# Patient Record
Sex: Female | Born: 1940 | Race: Black or African American | Hispanic: No | Marital: Married | Smoking: Former smoker
Health system: Southern US, Community
[De-identification: ages and names within clinical notes are randomized; demographics above are authoritative.]

## PROBLEM LIST (undated history)

## (undated) DIAGNOSIS — I951 Orthostatic hypotension: Secondary | ICD-10-CM

## (undated) DIAGNOSIS — R51 Headache: Secondary | ICD-10-CM

## (undated) DIAGNOSIS — I2541 Coronary artery aneurysm: Secondary | ICD-10-CM

## (undated) DIAGNOSIS — J449 Chronic obstructive pulmonary disease, unspecified: Secondary | ICD-10-CM

## (undated) DIAGNOSIS — K219 Gastro-esophageal reflux disease without esophagitis: Secondary | ICD-10-CM

## (undated) DIAGNOSIS — G4700 Insomnia, unspecified: Secondary | ICD-10-CM

## (undated) DIAGNOSIS — R519 Headache, unspecified: Secondary | ICD-10-CM

## (undated) DIAGNOSIS — I509 Heart failure, unspecified: Secondary | ICD-10-CM

## (undated) DIAGNOSIS — D649 Anemia, unspecified: Secondary | ICD-10-CM

## (undated) DIAGNOSIS — E1140 Type 2 diabetes mellitus with diabetic neuropathy, unspecified: Secondary | ICD-10-CM

## (undated) DIAGNOSIS — I10 Essential (primary) hypertension: Secondary | ICD-10-CM

## (undated) DIAGNOSIS — N189 Chronic kidney disease, unspecified: Secondary | ICD-10-CM

## (undated) DIAGNOSIS — Q046 Congenital cerebral cysts: Secondary | ICD-10-CM

## (undated) DIAGNOSIS — I739 Peripheral vascular disease, unspecified: Secondary | ICD-10-CM

## (undated) DIAGNOSIS — I219 Acute myocardial infarction, unspecified: Secondary | ICD-10-CM

## (undated) DIAGNOSIS — Z8669 Personal history of other diseases of the nervous system and sense organs: Secondary | ICD-10-CM

## (undated) DIAGNOSIS — I2510 Atherosclerotic heart disease of native coronary artery without angina pectoris: Secondary | ICD-10-CM

## (undated) DIAGNOSIS — M1990 Unspecified osteoarthritis, unspecified site: Secondary | ICD-10-CM

## (undated) HISTORY — PX: OTHER SURGICAL HISTORY: SHX169

## (undated) HISTORY — DX: Heart failure, unspecified: I50.9

## (undated) HISTORY — DX: Coronary artery aneurysm: I25.41

## (undated) HISTORY — DX: Gastro-esophageal reflux disease without esophagitis: K21.9

## (undated) HISTORY — PX: ABDOMINAL HYSTERECTOMY: SHX81

## (undated) HISTORY — PX: CARPAL TUNNEL RELEASE: SHX101

## (undated) HISTORY — DX: Atherosclerotic heart disease of native coronary artery without angina pectoris: I25.10

## (undated) HISTORY — DX: Chronic kidney disease, unspecified: N18.9

## (undated) HISTORY — DX: Chronic obstructive pulmonary disease, unspecified: J44.9

## (undated) HISTORY — DX: Anemia, unspecified: D64.9

## (undated) HISTORY — DX: Essential (primary) hypertension: I10

## (undated) HISTORY — DX: Acute myocardial infarction, unspecified: I21.9

## (undated) HISTORY — DX: Unspecified osteoarthritis, unspecified site: M19.90

## (undated) HISTORY — PX: AV FISTULA PLACEMENT: SHX1204

## (undated) HISTORY — DX: Insomnia, unspecified: G47.00

## (undated) HISTORY — DX: Peripheral vascular disease, unspecified: I73.9

## (undated) HISTORY — DX: Type 2 diabetes mellitus with diabetic neuropathy, unspecified: E11.40

---

## 2010-02-07 DIAGNOSIS — I2510 Atherosclerotic heart disease of native coronary artery without angina pectoris: Secondary | ICD-10-CM

## 2010-02-07 HISTORY — DX: Atherosclerotic heart disease of native coronary artery without angina pectoris: I25.10

## 2010-02-07 HISTORY — PX: CORONARY ARTERY BYPASS GRAFT: SHX141

## 2011-02-08 DIAGNOSIS — I219 Acute myocardial infarction, unspecified: Secondary | ICD-10-CM

## 2011-02-08 HISTORY — DX: Acute myocardial infarction, unspecified: I21.9

## 2011-05-16 ENCOUNTER — Encounter: Payer: Self-pay | Admitting: Vascular Surgery

## 2011-05-17 ENCOUNTER — Encounter: Payer: Self-pay | Admitting: Vascular Surgery

## 2011-05-24 ENCOUNTER — Encounter: Payer: Self-pay | Admitting: Vascular Surgery

## 2011-05-25 ENCOUNTER — Encounter: Payer: Self-pay | Admitting: Vascular Surgery

## 2011-05-25 ENCOUNTER — Ambulatory Visit (INDEPENDENT_AMBULATORY_CARE_PROVIDER_SITE_OTHER): Payer: Medicare Other | Admitting: Vascular Surgery

## 2011-05-25 ENCOUNTER — Ambulatory Visit (INDEPENDENT_AMBULATORY_CARE_PROVIDER_SITE_OTHER): Payer: Medicare Other | Admitting: *Deleted

## 2011-05-25 VITALS — BP 162/69 | HR 72 | Temp 98.1°F | Ht 66.0 in | Wt 176.0 lb

## 2011-05-25 DIAGNOSIS — L97509 Non-pressure chronic ulcer of other part of unspecified foot with unspecified severity: Secondary | ICD-10-CM

## 2011-05-25 DIAGNOSIS — I7025 Atherosclerosis of native arteries of other extremities with ulceration: Secondary | ICD-10-CM | POA: Insufficient documentation

## 2011-05-25 DIAGNOSIS — I739 Peripheral vascular disease, unspecified: Secondary | ICD-10-CM

## 2011-05-25 DIAGNOSIS — L98499 Non-pressure chronic ulcer of skin of other sites with unspecified severity: Secondary | ICD-10-CM

## 2011-05-25 DIAGNOSIS — I779 Disorder of arteries and arterioles, unspecified: Secondary | ICD-10-CM

## 2011-05-25 NOTE — Progress Notes (Signed)
[facility] and [facility]in Specialist of [facility]  Patient name: Holly Ellison MRN: [id number] DOB: 12-12-1940 Sex: female  REASON FOR CONSULT: Peripheral vascular disease  HPI: Holly Ellison is a 71 y.o. female who recently moved from [place] approximately 2 months ago. She has had a previous left carotid endarterectomy in his also had previous coronary revascularization done in [place]. She was noted to have a trophic ulcer on her left foot with poor circulation and was referred by Dr. Lucianne Lei for vascular consultation. She describes pain in both lower extremities but more significantly on the left side. She experiences pain in her thigh and calf which is associated with ambulation and is not really relieved with rest. Her symptoms occur with standing sitting and also lying flat. She does have significant neuropathy in both feet. I do not get any clear-cut history of rest pain. She states that she's had Allis on her left foot for some time but does not know exactly how long. She denies any significant drainage from the talus on her left foot or erythema. She's had no fever or chills.  Past Medical History  Diagnosis Date  . Chronic kidney disease   . Arthritis     Osteoarthritis  . Neuropathy in diabetes   . Insomnia   . Asthma   . CAD (coronary artery disease) 2012    3 stents  . GERD (gastroesophageal reflux disease)   . Diabetes mellitus     Type II  . COPD (chronic obstructive pulmonary disease)   . Hypertension   . Peripheral vascular disease   . Anemia   . CHF (congestive heart failure)   . Coronary artery dilation   . Myocardial infarction 2013    x 3    History reviewed. No pertinent family history.  SOCIAL HISTORY: History  Substance Use Topics  . Smoking status: Former Smoker    Types: Cigarettes    Quit date: 05/24/1996  . Smokeless tobacco: Never Used  . Alcohol Use: No    Allergies  Allergen Reactions  . Ciprocin-Fluocin-Procin (Fluocinolone Acetonide)  Itching  . Dilaudid (Hydromorphone Hcl) Itching  . Morphine Sulfate Itching  . Opium Itching    Current Outpatient Prescriptions  Medication Sig Dispense Refill  . acetaminophen (TYLENOL) 325 MG tablet Take 650 mg by mouth every 6 (six) hours as needed.      Marland Kitchen AMLODIPINE BESYLATE PO Take 10 mg by mouth daily.      Marland Kitchen aspirin EC 81 MG tablet Take 81 mg by mouth daily.      . ATORVASTATIN CALCIUM PO Take 40 mg by mouth at bedtime.      . bisacodyl (DULCOLAX) 5 MG EC tablet Take 5 mg by mouth daily as needed.      . bumetanide (BUMEX) 1 MG tablet Take 1 mg by mouth daily.      Marland Kitchen CLOPIDOGREL BISULFATE PO Take 75 mg by mouth daily.      . darbepoetin (ARANESP, ALBUMIN FREE,) 40 MCG/0.4ML SOLN Inject 40 mcg into the skin once a week. Every week on Wednesday      . fluticasone (FLOVENT HFA) 110 MCG/ACT inhaler Inhale 1 puff into the lungs 2 (two) times daily.      Marland Kitchen gabapentin (NEURONTIN) 100 MG capsule Take 100 mg by mouth 3 (three) times daily.      Marland Kitchen HYDRALAZINE HCL PO Take 100 mg by mouth 3 (three) times daily.      . insulin glargine (LANTUS) 100 UNIT/ML injection Inject into  the skin at bedtime.      . insulin lispro (HUMALOG) 100 UNIT/ML injection Inject 100 Units into the skin 3 (three) times daily before meals.      Marland Kitchen ipratropium-albuterol (DUONEB) 0.5-2.5 (3) MG/3ML SOLN Take 0.5-2.5 mLs by nebulization.      . isosorbide mononitrate (IMDUR) 60 MG 24 hr tablet Take 60 mg by mouth daily.      Marland Kitchen lactulose (CHRONULAC) 10 GM/15ML solution Take 20 g by mouth 3 (three) times daily.      . methimazole (TAPAZOLE) 10 MG tablet Take 10 mg by mouth 3 (three) times daily.      . metolazone (ZAROXOLYN) 2.5 MG tablet Take 2.5 mg by mouth. Every  2 days      . metoprolol succinate (TOPROL-XL) 100 MG 24 hr tablet Take 100 mg by mouth 2 (two) times daily. Take with or immediately following a meal.      . oxychlorosene (CLORPACTIN WCS 90) powder Apply topically once.      . OXYCODONE HCL PO Take 10 mg by  mouth every 8 (eight) hours as needed.      . pantoprazole (PROTONIX) 20 MG tablet Take 20 mg by mouth daily.      Marland Kitchen PANTOPRAZOLE SODIUM PO Take 40 mg by mouth daily.      . Potassium Chloride Crys CR (KLOR-CON M20 PO) Take 20 mEq by mouth daily.      Bernadette Hoit Sodium (SENNA PLUS PO) Take by mouth.      . Vitamin D, Ergocalciferol, (DRISDOL) 50000 UNITS CAPS Take 50,000 Units by mouth.        REVIEW OF SYSTEMS: Arly.Keller ] denotes positive finding; [  ] denotes negative finding  CARDIOVASCULAR:  Arly.Keller ] chest pain- stable   [ ]  chest pressure   [ ]  palpitations   [ ]  orthopnea   Arly.Keller ] dyspnea on exertion   Arly.Keller ] claudication   [ ]  rest pain   [ ]  DVT   [ ]  phlebitis PULMONARY:   [ ]  productive cough   [ ]  asthma   Arly.Keller ] wheezing NEUROLOGIC:   Arly.Keller ] weakness  Arly.Keller ] paresthesias  [ ]  aphasia  [ ]  amaurosis  [ ]  dizziness HEMATOLOGIC:   [ ]  bleeding problems   [ ]  clotting disorders MUSCULOSKELETAL:  [ ]  joint pain   [ ]  joint swelling [ ]  leg swelling GASTROINTESTINAL: [ ]   blood in stool  [ ]   hematemesis GENITOURINARY:  [ ]   dysuria  [ ]   hematuria PSYCHIATRIC:  [ ]  history of major depression INTEGUMENTARY:  [ ]  rashes  [ ]  ulcers CONSTITUTIONAL:  [ ]  fever   [ ]  chills  PHYSICAL EXAM: Filed Vitals:   05/25/11 1029  BP: 162/69  Pulse: 72  Temp: 98.1 F (36.7 C)  TempSrc: Oral  Height: 5\' 6"  (1.676 m)  Weight: 176 lb (79.833 kg)   Body mass index is 28.41 kg/(m^2). GENERAL: The patient is a well-nourished female, in no acute distress. The vital signs are documented above. CARDIOVASCULAR: There is a regular rate and rhythm without significant murmur appreciated. I do not detect carotid bruits. She does have a left neck incision where she had previous left carotid endarterectomy she has a diminished but palpable left radial pulse with a functioning left upper arm AV fistula. The fistula has an excellent thrill. She has palpable femoral pulses. I cannot palpate a right popliteal pulse.  She has a diminished left popliteal pulse.  I cannot palpate pedal pulses. Both feet appear adequately perfused. PULMONARY: There is good air exchange bilaterally without wheezing or rales. ABDOMEN: Soft and non-tender with normal pitched bowel sounds.  MUSCULOSKELETAL: There are no major deformities or cyanosis. NEUROLOGIC: No focal weakness or paresthesias are detected. SKIN: There are no ulcers or rashes noted. She does have a callous on the plantar aspect of her left foot beneath the first metatarsal head. PSYCHIATRIC: The patient has a normal affect.  I have independently interpreted her arterial Doppler study which was done in our office today. She has monophasic Doppler signals in the right foot with an ABI of 60%. She has triphasic Doppler signals in the left posterior tibial and dorsalis pedis positions with an ABI of 94%.  I have also reviewed the records sent with her from [facility]. She is being evaluated with a trophic ulcer is being referred to a podiatrist. Also looked her records from [place]. She has a functioning left upper arm AV graft although she is currently not on dialysis.  MEDICAL ISSUES: With respect to her left leg symptoms, I cannot attribute this to peripheral vascular disease. Although I cannot palpate pedal pulses on the left, she has triphasic Doppler signals in the dorsalis pedis and posterior tibial positions with an ABI of 94%. In addition, she experiences pain in the left leg with standing and sitting in lying flat which are not consistent with the symptoms she would experience with peripheral vascular disease. She does have evidence of infrainguinal arterial occlusive disease on the right but has no nonhealing ulcers in no rest pain or significant claudication.   At this point I do not think she needs any further workup from a vascular standpoint unless she developed significant symptoms on the right lower extremity. I have ordered follow up ABIs  for 6 months and I'll see her back at that time. In addition given that she's had previous left carotid endarterectomy will obtain a carotid duplex scan to have as a baseline at that time. She knows to call sooner she has problems. Fortunately she is not a smoker.  Johari Bennetts S [facility] [place] [facility]: [phone]

## 2011-06-05 ENCOUNTER — Observation Stay (HOSPITAL_COMMUNITY)
Admission: AD | Admit: 2011-06-05 | Discharge: 2011-06-11 | Disposition: A | Discharge status: Home / Self Care | Payer: Medicare Other | Source: Other Acute Inpatient Hospital | Attending: Family Medicine | Admitting: Family Medicine

## 2011-06-05 ENCOUNTER — Encounter (HOSPITAL_COMMUNITY): Payer: Self-pay | Admitting: *Deleted

## 2011-06-05 ENCOUNTER — Inpatient Hospital Stay (HOSPITAL_COMMUNITY): Payer: Medicare Other

## 2011-06-05 DIAGNOSIS — I5030 Unspecified diastolic (congestive) heart failure: Principal | ICD-10-CM | POA: Insufficient documentation

## 2011-06-05 DIAGNOSIS — D638 Anemia in other chronic diseases classified elsewhere: Secondary | ICD-10-CM | POA: Insufficient documentation

## 2011-06-05 DIAGNOSIS — G629 Polyneuropathy, unspecified: Secondary | ICD-10-CM

## 2011-06-05 DIAGNOSIS — E1142 Type 2 diabetes mellitus with diabetic polyneuropathy: Secondary | ICD-10-CM | POA: Insufficient documentation

## 2011-06-05 DIAGNOSIS — I2510 Atherosclerotic heart disease of native coronary artery without angina pectoris: Secondary | ICD-10-CM | POA: Insufficient documentation

## 2011-06-05 DIAGNOSIS — E871 Hypo-osmolality and hyponatremia: Secondary | ICD-10-CM | POA: Insufficient documentation

## 2011-06-05 DIAGNOSIS — I509 Heart failure, unspecified: Secondary | ICD-10-CM | POA: Insufficient documentation

## 2011-06-05 DIAGNOSIS — E1149 Type 2 diabetes mellitus with other diabetic neurological complication: Secondary | ICD-10-CM | POA: Insufficient documentation

## 2011-06-05 DIAGNOSIS — R079 Chest pain, unspecified: Secondary | ICD-10-CM | POA: Insufficient documentation

## 2011-06-05 DIAGNOSIS — J4489 Other specified chronic obstructive pulmonary disease: Secondary | ICD-10-CM | POA: Insufficient documentation

## 2011-06-05 DIAGNOSIS — E119 Type 2 diabetes mellitus without complications: Secondary | ICD-10-CM | POA: Diagnosis present

## 2011-06-05 DIAGNOSIS — I120 Hypertensive chronic kidney disease with stage 5 chronic kidney disease or end stage renal disease: Secondary | ICD-10-CM | POA: Insufficient documentation

## 2011-06-05 DIAGNOSIS — I739 Peripheral vascular disease, unspecified: Secondary | ICD-10-CM

## 2011-06-05 DIAGNOSIS — J811 Chronic pulmonary edema: Secondary | ICD-10-CM

## 2011-06-05 DIAGNOSIS — M1990 Unspecified osteoarthritis, unspecified site: Secondary | ICD-10-CM | POA: Insufficient documentation

## 2011-06-05 DIAGNOSIS — N185 Chronic kidney disease, stage 5: Secondary | ICD-10-CM

## 2011-06-05 DIAGNOSIS — I10 Essential (primary) hypertension: Secondary | ICD-10-CM

## 2011-06-05 DIAGNOSIS — I25810 Atherosclerosis of coronary artery bypass graft(s) without angina pectoris: Secondary | ICD-10-CM

## 2011-06-05 DIAGNOSIS — J449 Chronic obstructive pulmonary disease, unspecified: Secondary | ICD-10-CM | POA: Diagnosis present

## 2011-06-05 DIAGNOSIS — Z951 Presence of aortocoronary bypass graft: Secondary | ICD-10-CM | POA: Insufficient documentation

## 2011-06-05 DIAGNOSIS — R0602 Shortness of breath: Secondary | ICD-10-CM | POA: Insufficient documentation

## 2011-06-05 HISTORY — DX: Congenital cerebral cysts: Q04.6

## 2011-06-05 LAB — GLUCOSE, CAPILLARY
Glucose-Capillary: 163 mg/dL — ABNORMAL HIGH (ref 70–99)
Glucose-Capillary: 122 mg/dL — ABNORMAL HIGH (ref 70–99)

## 2011-06-05 LAB — HEPATITIS B SURFACE ANTIBODY,QUALITATIVE: Hep B S Ab: POSITIVE — AB

## 2011-06-05 LAB — COMPREHENSIVE METABOLIC PANEL
Albumin: 3 g/dL — ABNORMAL LOW (ref 3.5–5.2)
BUN: 77 mg/dL — ABNORMAL HIGH (ref 6–23)
Calcium: 8.4 mg/dL (ref 8.4–10.5)
Creatinine, Ser: 6.29 mg/dL — ABNORMAL HIGH (ref 0.50–1.10)
GFR calc Af Amer: 7 mL/min — ABNORMAL LOW (ref >90)
Glucose, Bld: 104 mg/dL — ABNORMAL HIGH (ref 70–99)
Potassium: 4.1 mEq/L (ref 3.5–5.1)
Total Protein: 6.8 g/dL (ref 6.0–8.3)

## 2011-06-05 LAB — TROPONIN I
Troponin I: <0.3 ng/mL (ref <0.30)
Troponin I: <0.3 ng/mL (ref <0.30)
Troponin I: <0.3 ng/mL (ref <0.30)

## 2011-06-05 LAB — CBC
HCT: 22.1 % — ABNORMAL LOW (ref 36.0–46.0)
HCT: 22.8 % — ABNORMAL LOW (ref 36.0–46.0)
Hemoglobin: 7.4 g/dL — ABNORMAL LOW (ref 12.0–15.0)
MCH: 27.1 pg (ref 26.0–34.0)
MCHC: 32.6 g/dL (ref 30.0–36.0)
MCHC: 32.5 g/dL (ref 30.0–36.0)
MCV: 83.1 fL (ref 78.0–100.0)
MCV: 83.8 fL (ref 78.0–100.0)
RDW: 18.2 % — ABNORMAL HIGH (ref 11.5–15.5)
RDW: 18.3 % — ABNORMAL HIGH (ref 11.5–15.5)

## 2011-06-05 LAB — IRON AND TIBC
Iron: 13 ug/dL — ABNORMAL LOW (ref 42–135)
Iron: <10 ug/dL — ABNORMAL LOW (ref 42–135)
Saturation Ratios: 5 % — ABNORMAL LOW (ref 20–55)
TIBC: 259 ug/dL (ref 250–470)
UIBC: 246 ug/dL (ref 125–400)
UIBC: 244 ug/dL (ref 125–400)

## 2011-06-05 LAB — BASIC METABOLIC PANEL
BUN: 77 mg/dL — ABNORMAL HIGH (ref 6–23)
CO2: 21 mEq/L (ref 19–32)
Chloride: 93 mEq/L — ABNORMAL LOW (ref 96–112)
Creatinine, Ser: 6.32 mg/dL — ABNORMAL HIGH (ref 0.50–1.10)
GFR calc Af Amer: 7 mL/min — ABNORMAL LOW (ref >90)
Potassium: 3.6 mEq/L (ref 3.5–5.1)

## 2011-06-05 LAB — LIPID PANEL
Cholesterol: 110 mg/dL (ref 0–200)
Triglycerides: 98 mg/dL (ref <150)

## 2011-06-05 LAB — PREPARE RBC (CROSSMATCH)

## 2011-06-05 LAB — RETICULOCYTES: Retic Ct Pct: 7 % — ABNORMAL HIGH (ref 0.4–3.1)

## 2011-06-05 LAB — FERRITIN: Ferritin: 86 ng/mL (ref 10–291)

## 2011-06-05 MED ORDER — CLOPIDOGREL BISULFATE 75 MG PO TABS
75.0000 mg | ORAL_TABLET | Freq: Every day | ORAL | Status: DC
  Administered 2011-06-05 – 2011-06-11 (×7): 75 mg via ORAL
  Filled 2011-06-05 (×9): qty 1

## 2011-06-05 MED ORDER — HEPARIN SODIUM (PORCINE) 1000 UNIT/ML DIALYSIS
1000.0000 [IU] | INTRAMUSCULAR | Status: DC | PRN
  Filled 2011-06-05: qty 1

## 2011-06-05 MED ORDER — LACTULOSE 10 GM/15ML PO SOLN
20.0000 g | Freq: Three times a day (TID) | ORAL | Status: DC
  Administered 2011-06-05 – 2011-06-07 (×6): 20 g via ORAL
  Filled 2011-06-05 (×12): qty 30

## 2011-06-05 MED ORDER — OXYCODONE HCL 10 MG PO TB12
10.0000 mg | ORAL_TABLET | Freq: Two times a day (BID) | ORAL | Status: DC
  Administered 2011-06-05 – 2011-06-07 (×4): 10 mg via ORAL
  Filled 2011-06-05 (×5): qty 1

## 2011-06-05 MED ORDER — METOPROLOL SUCCINATE ER 100 MG PO TB24
100.0000 mg | ORAL_TABLET | Freq: Every day | ORAL | Status: DC
  Administered 2011-06-05 – 2011-06-11 (×7): 100 mg via ORAL
  Filled 2011-06-05 (×7): qty 1

## 2011-06-05 MED ORDER — PENTAFLUOROPROP-TETRAFLUOROETH EX AERO: 1.0000 "application " | INHALATION_SPRAY | CUTANEOUS | Status: DC | PRN

## 2011-06-05 MED ORDER — HYDRALAZINE HCL 50 MG PO TABS
100.0000 mg | ORAL_TABLET | Freq: Three times a day (TID) | ORAL | Status: DC
  Administered 2011-06-05 – 2011-06-11 (×17): 100 mg via ORAL
  Filled 2011-06-05 (×22): qty 2

## 2011-06-05 MED ORDER — ALTEPLASE 2 MG IJ SOLR
2.0000 mg | Freq: Once | INTRAMUSCULAR | Status: AC | PRN
  Filled 2011-06-05: qty 2

## 2011-06-05 MED ORDER — SODIUM CHLORIDE 0.9 % IV SOLN: 100.0000 mL | INTRAVENOUS | Status: DC | PRN

## 2011-06-05 MED ORDER — OXYCODONE HCL 10 MG PO TB12
10.0000 mg | ORAL_TABLET | Freq: Two times a day (BID) | ORAL | Status: DC
  Filled 2011-06-05 (×3): qty 1

## 2011-06-05 MED ORDER — LIDOCAINE-PRILOCAINE 2.5-2.5 % EX CREA
1.0000 "application " | TOPICAL_CREAM | CUTANEOUS | Status: DC | PRN
  Filled 2011-06-05: qty 5

## 2011-06-05 MED ORDER — METHIMAZOLE 10 MG PO TABS
10.0000 mg | ORAL_TABLET | Freq: Three times a day (TID) | ORAL | Status: DC
  Administered 2011-06-05 – 2011-06-11 (×18): 10 mg via ORAL
  Filled 2011-06-05 (×21): qty 1

## 2011-06-05 MED ORDER — ASPIRIN EC 325 MG PO TBEC
325.0000 mg | DELAYED_RELEASE_TABLET | Freq: Every day | ORAL | Status: DC
  Administered 2011-06-05 – 2011-06-11 (×7): 325 mg via ORAL
  Filled 2011-06-05 (×7): qty 1

## 2011-06-05 MED ORDER — HEPARIN SODIUM (PORCINE) 5000 UNIT/ML IJ SOLN
5000.0000 [IU] | Freq: Three times a day (TID) | INTRAMUSCULAR | Status: DC
  Administered 2011-06-05 – 2011-06-10 (×15): 5000 [IU] via SUBCUTANEOUS
  Filled 2011-06-05 (×19): qty 1

## 2011-06-05 MED ORDER — NITROGLYCERIN 2 % TD OINT
0.5000 [in_us] | TOPICAL_OINTMENT | Freq: Four times a day (QID) | TRANSDERMAL | Status: DC
  Administered 2011-06-05 (×2): 0.5 [in_us] via TOPICAL
  Filled 2011-06-05: qty 30

## 2011-06-05 MED ORDER — ISOSORBIDE MONONITRATE ER 60 MG PO TB24
60.0000 mg | ORAL_TABLET | Freq: Every day | ORAL | Status: DC
  Administered 2011-06-05 – 2011-06-11 (×7): 60 mg via ORAL
  Filled 2011-06-05 (×7): qty 1

## 2011-06-05 MED ORDER — HYDROMORPHONE HCL PF 1 MG/ML IJ SOLN: 0.5000 mg | INTRAMUSCULAR | Status: DC | PRN

## 2011-06-05 MED ORDER — SODIUM CHLORIDE 0.9 % IJ SOLN: 3.0000 mL | INTRAMUSCULAR | Status: DC | PRN

## 2011-06-05 MED ORDER — LIDOCAINE HCL (PF) 1 % IJ SOLN: 5.0000 mL | INTRAMUSCULAR | Status: DC | PRN

## 2011-06-05 MED ORDER — NITROGLYCERIN IN D5W 200-5 MCG/ML-% IV SOLN
2.0000 ug/min | INTRAVENOUS | Status: DC
  Administered 2011-06-05: 20 ug/min via INTRAVENOUS

## 2011-06-05 MED ORDER — ONDANSETRON HCL 4 MG/2ML IJ SOLN
4.0000 mg | Freq: Four times a day (QID) | INTRAMUSCULAR | Status: DC | PRN
  Administered 2011-06-06 – 2011-06-08 (×3): 4 mg via INTRAVENOUS
  Filled 2011-06-05 (×3): qty 2

## 2011-06-05 MED ORDER — FLUTICASONE PROPIONATE HFA 110 MCG/ACT IN AERO
1.0000 | INHALATION_SPRAY | Freq: Two times a day (BID) | RESPIRATORY_TRACT | Status: DC
  Administered 2011-06-05 – 2011-06-10 (×9): 1 via RESPIRATORY_TRACT
  Filled 2011-06-05: qty 12

## 2011-06-05 MED ORDER — INSULIN ASPART 100 UNIT/ML ~~LOC~~ SOLN
0.0000 [IU] | Freq: Three times a day (TID) | SUBCUTANEOUS | Status: DC
  Administered 2011-06-05: 2 [IU] via SUBCUTANEOUS
  Administered 2011-06-05: 3 [IU] via SUBCUTANEOUS
  Administered 2011-06-06: 2 [IU] via SUBCUTANEOUS
  Administered 2011-06-08: 3 [IU] via SUBCUTANEOUS
  Administered 2011-06-08 – 2011-06-10 (×2): 2 [IU] via SUBCUTANEOUS

## 2011-06-05 MED ORDER — FUROSEMIDE 10 MG/ML IJ SOLN
120.0000 mg | Freq: Two times a day (BID) | INTRAVENOUS | Status: DC
  Administered 2011-06-05 (×2): 120 mg via INTRAVENOUS
  Filled 2011-06-05 (×4): qty 12

## 2011-06-05 MED ORDER — SIMVASTATIN 20 MG PO TABS
20.0000 mg | ORAL_TABLET | Freq: Every day | ORAL | Status: DC
  Administered 2011-06-05 – 2011-06-10 (×6): 20 mg via ORAL
  Filled 2011-06-05 (×7): qty 1

## 2011-06-05 MED ORDER — ONDANSETRON HCL 4 MG PO TABS
4.0000 mg | ORAL_TABLET | Freq: Four times a day (QID) | ORAL | Status: DC | PRN
  Administered 2011-06-06: 4 mg via ORAL
  Filled 2011-06-05 (×2): qty 1

## 2011-06-05 MED ORDER — PANTOPRAZOLE SODIUM 40 MG PO TBEC
40.0000 mg | DELAYED_RELEASE_TABLET | Freq: Every day | ORAL | Status: DC
  Administered 2011-06-05 – 2011-06-11 (×7): 40 mg via ORAL
  Filled 2011-06-05 (×6): qty 1

## 2011-06-05 MED ORDER — ALUM & MAG HYDROXIDE-SIMETH 200-200-20 MG/5ML PO SUSP
30.0000 mL | Freq: Four times a day (QID) | ORAL | Status: DC | PRN
  Filled 2011-06-05: qty 30

## 2011-06-05 MED ORDER — INSULIN ASPART 100 UNIT/ML ~~LOC~~ SOLN: 0.0000 [IU] | Freq: Every day | SUBCUTANEOUS | Status: DC

## 2011-06-05 MED ORDER — BISACODYL 5 MG PO TBEC
5.0000 mg | DELAYED_RELEASE_TABLET | Freq: Every day | ORAL | Status: DC | PRN
  Administered 2011-06-08: 5 mg via ORAL
  Filled 2011-06-05: qty 1

## 2011-06-05 MED ORDER — INSULIN GLARGINE 100 UNIT/ML ~~LOC~~ SOLN
30.0000 [IU] | Freq: Every day | SUBCUTANEOUS | Status: DC
  Administered 2011-06-05 – 2011-06-10 (×5): 30 [IU] via SUBCUTANEOUS

## 2011-06-05 MED ORDER — NEPRO/CARBSTEADY PO LIQD
237.0000 mL | ORAL | Status: DC | PRN
  Filled 2011-06-05: qty 237

## 2011-06-05 MED ORDER — AMLODIPINE BESYLATE 10 MG PO TABS
10.0000 mg | ORAL_TABLET | Freq: Every day | ORAL | Status: DC
  Administered 2011-06-05 – 2011-06-11 (×7): 10 mg via ORAL
  Filled 2011-06-05 (×7): qty 1

## 2011-06-05 MED ORDER — HEPARIN SODIUM (PORCINE) 1000 UNIT/ML DIALYSIS
2000.0000 [IU] | INTRAMUSCULAR | Status: DC | PRN
  Administered 2011-06-09: 2000 [IU] via INTRAVENOUS_CENTRAL
  Filled 2011-06-05: qty 2

## 2011-06-05 MED ORDER — GABAPENTIN 100 MG PO CAPS
100.0000 mg | ORAL_CAPSULE | Freq: Three times a day (TID) | ORAL | Status: DC
  Administered 2011-06-05 – 2011-06-11 (×18): 100 mg via ORAL
  Filled 2011-06-05 (×21): qty 1

## 2011-06-05 MED ORDER — FUROSEMIDE 10 MG/ML IJ SOLN
120.0000 mg | Freq: Three times a day (TID) | INTRAVENOUS | Status: DC
  Administered 2011-06-05 (×2): 120 mg via INTRAVENOUS
  Filled 2011-06-05 (×5): qty 12

## 2011-06-05 NOTE — Consult Note (Signed)
Holly Ellison 06/05/2011 Holly Ellison D Requesting Physician:  Dr. Butler Denmark  Reason for Consult:  CKD pt with SOB , pulm edema, creat over 6 HPI: The patient is a 71 y.o. year-old AAF with hx of DM, HTN, CAD with CABG 2012 and CKD followed by Dr. Caryn Section with [facility]. She had a LUA AVF fashioned in Oct 2012. She presented with SOB. CXR showed pulm edema, she was sent up for admission here from [facility]. She endorses fatigue and marginal appetite. No fevers, prod cough. She does have CP from time to time.   She rec'd IV lasix with good UOP and a second CXR done today shows improving edema pattern compared to yesterday's xray. She is not in distress  Creatinine, Ser  Date/Time Value Range Status  06/05/2011  6:30 AM 6.32* 0.50-1.10 (mg/dL) Final  9/60/4540  9:81 AM 6.29* 0.50-1.10 (mg/dL) Final    Past Medical History:  Past Medical History  Diagnosis Date  . Chronic kidney disease   . Arthritis     Osteoarthritis  . Neuropathy in diabetes   . Insomnia   . Asthma   . CAD (coronary artery disease) 2012    3 stents  . GERD (gastroesophageal reflux disease)   . Diabetes mellitus     Type II  . COPD (chronic obstructive pulmonary disease)   . Hypertension   . Peripheral vascular disease   . Anemia   . CHF (congestive heart failure)   . Coronary artery dilation   . Myocardial infarction 2013    x 3    Past Surgical History:  Past Surgical History  Procedure Date  . Abdominal hysterectomy   . Coronary artery bypass graft 2012    7 stents  . Cea     Left  . Carpal tunnel release   . Av fistula placement     Family History:  Family History  Problem Relation Age of Onset  . Diabetes type II     Social History:  reports that she quit smoking about 15 years ago. Her smoking use included Cigarettes. She has never used smokeless tobacco. She reports that she does not drink alcohol or use illicit drugs.  Allergies:  Allergies  Allergen Reactions  . Ciprocin-Fluocin-Procin  (Fluocinolone Acetonide) Itching  . Dilaudid (Hydromorphone Hcl) Itching  . Morphine Sulfate Itching  . Opium Itching    Home medications: Prior to Admission medications   Medication Sig Start Date End Date Taking? Authorizing Provider  acetaminophen (TYLENOL) 325 MG tablet Take 650 mg by mouth every 6 (six) hours as needed.    Historical Provider, MD  AMLODIPINE BESYLATE PO Take 10 mg by mouth daily.    Historical Provider, MD  aspirin EC 81 MG tablet Take 81 mg by mouth daily.    Historical Provider, MD  ATORVASTATIN CALCIUM PO Take 40 mg by mouth at bedtime.    Historical Provider, MD  bisacodyl (DULCOLAX) 5 MG EC tablet Take 5 mg by mouth daily as needed.    Historical Provider, MD  bumetanide (BUMEX) 1 MG tablet Take 1 mg by mouth daily.    Historical Provider, MD  CLOPIDOGREL BISULFATE PO Take 75 mg by mouth daily.    Historical Provider, MD  darbepoetin (ARANESP, ALBUMIN FREE,) 40 MCG/0.4ML SOLN Inject 40 mcg into the skin once a week. Every week on Wednesday    Historical Provider, MD  fluticasone (FLOVENT HFA) 110 MCG/ACT inhaler Inhale 1 puff into the lungs 2 (two) times daily.    Historical Provider,  MD  gabapentin (NEURONTIN) 100 MG capsule Take 100 mg by mouth 3 (three) times daily.    Historical Provider, MD  HYDRALAZINE HCL PO Take 100 mg by mouth 3 (three) times daily.    Historical Provider, MD  insulin glargine (LANTUS) 100 UNIT/ML injection Inject into the skin at bedtime.    Historical Provider, MD  ipratropium-albuterol (DUONEB) 0.5-2.5 (3) MG/3ML SOLN Take 0.5-2.5 mLs by nebulization.    Historical Provider, MD  isosorbide mononitrate (IMDUR) 60 MG 24 hr tablet Take 60 mg by mouth daily.    Historical Provider, MD  lactulose (CHRONULAC) 10 GM/15ML solution Take 20 g by mouth 3 (three) times daily.    Historical Provider, MD  methimazole (TAPAZOLE) 10 MG tablet Take 10 mg by mouth 3 (three) times daily.    Historical Provider, MD  metolazone (ZAROXOLYN) 2.5 MG tablet  Take 2.5 mg by mouth. Every  2 days    Historical Provider, MD  metoprolol succinate (TOPROL-XL) 100 MG 24 hr tablet Take 100 mg by mouth 2 (two) times daily. Take with or immediately following a meal.    Historical Provider, MD  oxychlorosene (CLORPACTIN WCS 90) powder Apply topically once.    Historical Provider, MD  OXYCODONE HCL PO Take 10 mg by mouth every 8 (eight) hours as needed.    Historical Provider, MD  pantoprazole (PROTONIX) 20 MG tablet Take 20 mg by mouth daily.    Historical Provider, MD  PANTOPRAZOLE SODIUM PO Take 40 mg by mouth daily.    Historical Provider, MD  Potassium Chloride Crys CR (KLOR-CON M20 PO) Take 20 mEq by mouth daily.    Historical Provider, MD  Sennosides-Docusate Sodium (SENNA PLUS PO) Take by mouth.    Historical Provider, MD  Vitamin D, Ergocalciferol, (DRISDOL) 50000 UNITS CAPS Take 50,000 Units by mouth.    Historical Provider, MD    Inpatient medications:    . amLODipine  10 mg Oral Daily  . aspirin EC  325 mg Oral Daily  . clopidogrel  75 mg Oral Q breakfast  . fluticasone  1 puff Inhalation BID  . furosemide  120 mg Intravenous BID  . gabapentin  100 mg Oral TID  . heparin  5,000 Units Subcutaneous Q8H  . hydrALAZINE  100 mg Oral Q8H  . insulin aspart  0-15 Units Subcutaneous TID WC  . insulin aspart  0-5 Units Subcutaneous QHS  . insulin glargine  30 Units Subcutaneous QHS  . isosorbide mononitrate  60 mg Oral Daily  . lactulose  20 g Oral TID  . methimazole  10 mg Oral TID  . metoprolol succinate  100 mg Oral Daily  . nitroGLYCERIN  0.5 inch Topical Q6H  . oxyCODONE  10 mg Oral Q12H  . pantoprazole  40 mg Oral Q1200  . simvastatin  20 mg Oral q1800  . DISCONTD: oxyCODONE  10 mg Oral Q12H    Review of Systems Gen:  Denies headache, fever, chills, sweats.  No weight loss. HEENT:  No visual change, sore throat, difficulty swallowing. Resp:  As above Cardiac:  As above GI:   Denies abdominal pain.   No nausea, vomiting, diarrhea.  No  constipation. GU:  Denies difficulty or change in voiding.  No change in urine color.     MS:  Denies joint pain or swelling.   Derm:  Denies skin rash or itching.  No chronic skin conditions.  Neuro:   Denies focal weakness, memory problems, hx stroke or TIA.   Psych:  Denies symptoms of depression of anxiety.  No hallucination.    Physical Exam:  Blood pressure 141/52, pulse 74, temperature 98.5 F (36.9 C), temperature source Oral, resp. rate 17, height 5\' 6"  (1.676 m), weight 82 kg (180 lb 12.4 oz), SpO2 96.00%.  Gen: no distress, lying at 30 deg Skin: no rash, cyanosis Neck: + JVD, bruits or LAN Chest: faint rales L base and minimally at R base, no wheezing, good air movement Heart: regular, no rub or gallop, sternotomy scar mid chest Abdomen: soft, nontender, protuberant, no ascites no HSM Ext: no edema of LE's.  Good thrill and bruit over a welldeveloped LUA AVF Neuro: alert, Ox3, no focal deficit Heme/Lymph: no bruising or LAN  Labs: Basic Metabolic Panel:  Lab 06/05/11 4098 06/05/11 0156  NA 130* 128*  K 3.6 4.1  CL 93* 92*  CO2 21 21  GLUCOSE 128* 104*  BUN 77* 77*  CREATININE 6.32* 6.29*  ALB -- --  CALCIUM 8.4 8.4  PHOS -- --   Liver Function Tests:  Lab 06/05/11 0156  AST 42*  ALT 54*  ALKPHOS 184*  BILITOT 0.5  PROT 6.8  ALBUMIN 3.0*   No results found for this basename: LIPASE:3,AMYLASE:3 in the last 168 hours No results found for this basename: AMMONIA:3 in the last 168 hours CBC:  Lab 06/05/11 0630 06/05/11 0156  WBC 8.3 8.3  NEUTROABS -- --  HGB 7.4* 7.2*  HCT 22.8* 22.1*  MCV 83.8 83.1  PLT 294 295   PT/INR: @labrcntip (inr:5) Cardiac Enzymes:  Lab 06/05/11 0630 06/05/11 0157  CKTOTAL -- --  CKMB -- --  CKMBINDEX -- --  TROPONINI <0.30 <0.30   CBG:  Lab 06/05/11 1124 06/05/11 0726 06/05/11 0134  GLUCAP 132* 91 107*    Iron Studies:  Lab 06/05/11 0156  IRON --  TIBC --  TRANSFERRIN --  FERRITIN 86    Xrays/Other  Studies: Dg Chest Port 1 View  06/05/2011  *RADIOLOGY REPORT*  Clinical Data: Follow-up CHF.  History of chest pain and shortness of breath.  PORTABLE CHEST - 1 VIEW  Comparison: Yesterday.  Findings: Stable enlargement of the cardiac silhouette and post CABG changes.  Decreased prominence of the pulmonary vasculature and interstitial markings.  No pleural fluid.  Unremarkable bones.  IMPRESSION: Improving CHF with stable cardiomegaly.  Original Report Authenticated By: Darrol Angel, M.D.   Dg Abd Portable 1v  06/05/2011  *RADIOLOGY REPORT*  Clinical Data: Abdominal distention.  PORTABLE ABDOMEN - 1 VIEW  Comparison: None.  Findings: Normal bowel gas pattern.  Mildly prominent stool. Unremarkable bones.  Bilateral pelvic phleboliths.  IMPRESSION: No acute abnormality.  Mildly prominent stool.  Original Report Authenticated By: Darrol Angel, M.D.    Assessment/Recommendations 1. CKD stage V- patient is volume overloaded and uremic with eGFR of 6 ml/min.  Needs to start dialysis. Will write orders for tomorrow morning. Plan HD daily x 2-3 days, then tiw. Hold transfusion tonight due to vol overload- will reorder with HD tomorrow. AVF looks quite mature and should be a good access.  2. Pulm edema- better with high dose IV lasix. Increased to 120 IV tid. 3. DM2 4. HTN 5. CAD, hx CABG 2012 6. Anemia- see above. Check FeSat, consider ESA. Transfuse with HD Monday. Hold transfusion today.  7. MBD- check phos and pth  Thanks for the referral.  We will follow.   Vinson Moselle  MD [employer] [phone] pgr    [phone] cell 06/05/2011, 2:19 PM

## 2011-06-05 NOTE — Progress Notes (Signed)
[facility]  Subjective: This is a 71 y/o female with CKD 5 admitted with fluid overload and chest pain.  She is laying comfortably in bed and states breathing has improved a great deal and chest pain has resolved. She has a headache which may be a migraine but does not want anything for it.   Objective: Blood pressure 140/62, pulse 73, temperature 98.9 F (37.2 C), temperature source Oral, resp. rate 15, height 5\' 6"  (1.676 m), weight 82 kg (180 lb 12.4 oz), SpO2 93.00%. Weight change:   Intake/Output Summary (Last 24 hours) at 06/05/11 1928 Last data filed at 06/05/11 1800  Gross per 24 hour  Intake    943 ml  Output   1500 ml  Net   -557 ml    Physical Exam: General appearance: alert, cooperative, no distress and morbidly obese Lungs: crackles at b/l bases Heart: regular rate and rhythm, S1, S2 normal Abdomen: soft, non-tender; bowel sounds normal; no masses,  no organomegaly Extremities: extremities normal, atraumatic, no cyanosis or edema  Lab Results:  Basename 06/05/11 0630 06/05/11 0156  NA 130* 128*  K 3.6 4.1  CL 93* 92*  CO2 21 21  GLUCOSE 128* 104*  BUN 77* 77*  CREATININE 6.32* 6.29*  CALCIUM 8.4 8.4  MG -- --  PHOS -- --    Basename 06/05/11 0156  AST 42*  ALT 54*  ALKPHOS 184*  BILITOT 0.5  PROT 6.8  ALBUMIN 3.0*   No results found for this basename: LIPASE:2,AMYLASE:2 in the last 72 hours  Basename 06/05/11 0630 06/05/11 0156  WBC 8.3 8.3  NEUTROABS -- --  HGB 7.4* 7.2*  HCT 22.8* 22.1*  MCV 83.8 83.1  PLT 294 295    Basename 06/05/11 1323 06/05/11 0630 06/05/11 0157  CKTOTAL -- -- --  CKMB -- -- --  CKMBINDEX -- -- --  TROPONINI <0.30 <0.30 <0.30   No components found with this basename: POCBNP:3 No results found for this basename: DDIMER:2 in the last 72 hours No results found for this basename: HGBA1C:2 in the last 72 hours  Basename 06/05/11 0157  CHOL 110  HDL 52  LDLCALC 38  TRIG 98  CHOLHDL 2.1  LDLDIRECT --     Basename 06/05/11 0630  TSH 1.374  T4TOTAL --  T3FREE --  THYROIDAB --    Basename 06/05/11 0156  VITAMINB12 621  FOLATE >20.0  FERRITIN 86  TIBC --  IRON --  RETICCTPCT 7.0*    Micro Results: Recent Results (from the past 240 hour(s))  MRSA PCR SCREENING     Status: Normal   Collection Time   06/05/11  1:10 AM      Component Value Range Status Comment   MRSA by PCR NEGATIVE  NEGATIVE  Final     Studies/Results: Dg Chest Port 1 View  06/05/2011  *RADIOLOGY REPORT*  Clinical Data: Follow-up CHF.  History of chest pain and shortness of breath.  PORTABLE CHEST - 1 VIEW  Comparison: Yesterday.  Findings: Stable enlargement of the cardiac silhouette and post CABG changes.  Decreased prominence of the pulmonary vasculature and interstitial markings.  No pleural fluid.  Unremarkable bones.  IMPRESSION: Improving CHF with stable cardiomegaly.  Original Report Authenticated By: Darrol Angel, M.D.   Dg Abd Portable 1v  06/05/2011  *RADIOLOGY REPORT*  Clinical Data: Abdominal distention.  PORTABLE ABDOMEN - 1 VIEW  Comparison: None.  Findings: Normal bowel gas pattern.  Mildly prominent stool. Unremarkable bones.  Bilateral pelvic phleboliths.  IMPRESSION:  No acute abnormality.  Mildly prominent stool.  Original Report Authenticated By: Darrol Angel, M.D.    Medications: Scheduled Meds:   . amLODipine  10 mg Oral Daily  . aspirin EC  325 mg Oral Daily  . clopidogrel  75 mg Oral Q breakfast  . fluticasone  1 puff Inhalation BID  . furosemide  120 mg Intravenous TID  . gabapentin  100 mg Oral TID  . heparin  5,000 Units Subcutaneous Q8H  . hydrALAZINE  100 mg Oral Q8H  . insulin aspart  0-15 Units Subcutaneous TID WC  . insulin aspart  0-5 Units Subcutaneous QHS  . insulin glargine  30 Units Subcutaneous QHS  . isosorbide mononitrate  60 mg Oral Daily  . lactulose  20 g Oral TID  . methimazole  10 mg Oral TID  . metoprolol succinate  100 mg Oral Daily  . nitroGLYCERIN   0.5 inch Topical Q6H  . oxyCODONE  10 mg Oral Q12H  . pantoprazole  40 mg Oral Q1200  . simvastatin  20 mg Oral q1800  . DISCONTD: furosemide  120 mg Intravenous BID  . DISCONTD: oxyCODONE  10 mg Oral Q12H   Continuous Infusions:   . DISCONTD: nitroGLYCERIN 20 mcg/min (06/05/11 0145)   PRN Meds:.sodium chloride, sodium chloride, alteplase, alum & mag hydroxide-simeth, bisacodyl, feeding supplement (NEPRO CARB STEADY), heparin, heparin, lidocaine, lidocaine-prilocaine, ondansetron (ZOFRAN) IV, ondansetron, pentafluoroprop-tetrafluoroeth, sodium chloride, DISCONTD: HYDROmorphone  Assessment/Plan: Principal Problem:  *Pulmonary edema Improved with diuresis.   ECHO reveals Gr 2 diastolic dysf and EF of 55-6-%   Chronic kidney disease (CKD), stage V Plans for dialysis tomorrow.  neprhology assistance appreciated.  Anemia- chronic Will be transfused 2 units by Nephrology during HD   CAD (coronary artery disease) of bypass graft Cont ASA   Peripheral neuropathy Cont gabapentin   Diabetes mellitus Sliding scale and lantus- sugars stable today   Hypertension   COPD (chronic obstructive pulmonary disease) Currently stable   LOS: 0 days   [facility] [phone] 06/05/2011, 7:28 PM

## 2011-06-05 NOTE — H&P (Addendum)
PCP:   Lucianne Lei, MD, MD  Cardiologist: [facility] heart and vascular Nephrologist: Dr. Caryn Section  Chief Complaint:  Shortness of breath and chest pains  HPI: This is a 71 year old female who 2 days ago developed worsening shortness of breath. Patient does have chronic shortness of breath with her COPD. She denies any cough but states she's been wheezing. Her wheezing is chronic. She denies any fevers, chills, lower extremity edema. She does have chronic kidney disease and had a recent AV fistula placed but has not started hemodialysis. She does urinate but she states she believes this is less recently. She denies any increase fluid or salt intake. She states she's been compliant with her medications. Additionally she developed chest pains for the past 2 days. Initially it was centrally located now it is also under her left breast. She denies any palpitations, diaphoresis. She has coronary artery disease status post CABG in October 2012. enroute from [facility] she was placed on a nitro drip which helped her chest pains. Currently she states her discomfort is 1/10. She states she does occasionally get chest pains. She states her last cardiac workup was in October 2012. She does have chronic anemia. At [facility] her hemoglobin was 8.8. She is unclear of her baseline hemoglobin. Her last colonoscopy was approximately 7 years ago. It was normal. The patient additionally complains of chronic constipation and some abdominal distention.  Patient transferred from [facility].  Review of Systems: Positives bolded   The patient denies anorexia, fever, weight loss,, vision loss, decreased hearing, hoarseness, chest pain, syncope, dyspnea on exertion, peripheral edema, balance deficits, hemoptysis, abdominal pain, melena, hematochezia, severe indigestion/heartburn, hematuria, incontinence, genital sores, muscle weakness, suspicious skin lesions, transient blindness, difficulty walking,  depression, unusual weight change, abnormal bleeding, enlarged lymph nodes, angioedema, and breast masses.  Past Medical History: Past Medical History  Diagnosis Date  . Chronic kidney disease   . Arthritis     Osteoarthritis  . Neuropathy in diabetes   . Insomnia   . Asthma   . CAD (coronary artery disease) 2012    3 stents  . GERD (gastroesophageal reflux disease)   . Diabetes mellitus     Type II  . COPD (chronic obstructive pulmonary disease)   . Hypertension   . Peripheral vascular disease   . Anemia   . CHF (congestive heart failure)   . Coronary artery dilation   . Myocardial infarction 2013    x 3   Past Surgical History  Procedure Date  . Abdominal hysterectomy   . Coronary artery bypass graft 2012    7 stents  . Cea     Left  . Carpal tunnel release   . Av fistula placement     Medications: Prior to Admission medications   Medication Sig Start Date End Date Taking? Authorizing Provider  acetaminophen (TYLENOL) 325 MG tablet Take 650 mg by mouth every 6 (six) hours as needed.    Historical Provider, MD  AMLODIPINE BESYLATE PO Take 10 mg by mouth daily.    Historical Provider, MD  aspirin EC 81 MG tablet Take 81 mg by mouth daily.    Historical Provider, MD  ATORVASTATIN CALCIUM PO Take 40 mg by mouth at bedtime.    Historical Provider, MD  bisacodyl (DULCOLAX) 5 MG EC tablet Take 5 mg by mouth daily as needed.    Historical Provider, MD  bumetanide (BUMEX) 1 MG tablet Take 1 mg by mouth daily.    Historical Provider, MD  CLOPIDOGREL  BISULFATE PO Take 75 mg by mouth daily.    Historical Provider, MD  darbepoetin (ARANESP, ALBUMIN FREE,) 40 MCG/0.4ML SOLN Inject 40 mcg into the skin once a week. Every week on Wednesday    Historical Provider, MD  fluticasone (FLOVENT HFA) 110 MCG/ACT inhaler Inhale 1 puff into the lungs 2 (two) times daily.    Historical Provider, MD  gabapentin (NEURONTIN) 100 MG capsule Take 100 mg by mouth 3 (three) times daily.    Historical  Provider, MD  HYDRALAZINE HCL PO Take 100 mg by mouth 3 (three) times daily.    Historical Provider, MD  insulin glargine (LANTUS) 100 UNIT/ML injection Inject into the skin at bedtime.    Historical Provider, MD  ipratropium-albuterol (DUONEB) 0.5-2.5 (3) MG/3ML SOLN Take 0.5-2.5 mLs by nebulization.    Historical Provider, MD  isosorbide mononitrate (IMDUR) 60 MG 24 hr tablet Take 60 mg by mouth daily.    Historical Provider, MD  lactulose (CHRONULAC) 10 GM/15ML solution Take 20 g by mouth 3 (three) times daily.    Historical Provider, MD  methimazole (TAPAZOLE) 10 MG tablet Take 10 mg by mouth 3 (three) times daily.    Historical Provider, MD  metolazone (ZAROXOLYN) 2.5 MG tablet Take 2.5 mg by mouth. Every  2 days    Historical Provider, MD  metoprolol succinate (TOPROL-XL) 100 MG 24 hr tablet Take 100 mg by mouth 2 (two) times daily. Take with or immediately following a meal.    Historical Provider, MD  oxychlorosene (CLORPACTIN WCS 90) powder Apply topically once.    Historical Provider, MD  OXYCODONE HCL PO Take 10 mg by mouth every 8 (eight) hours as needed.    Historical Provider, MD  pantoprazole (PROTONIX) 20 MG tablet Take 20 mg by mouth daily.    Historical Provider, MD  PANTOPRAZOLE SODIUM PO Take 40 mg by mouth daily.    Historical Provider, MD  Potassium Chloride Crys CR (KLOR-CON M20 PO) Take 20 mEq by mouth daily.    Historical Provider, MD  Sennosides-Docusate Sodium (SENNA PLUS PO) Take by mouth.    Historical Provider, MD  Vitamin D, Ergocalciferol, (DRISDOL) 50000 UNITS CAPS Take 50,000 Units by mouth.    Historical Provider, MD    Allergies:   Allergies  Allergen Reactions  . Ciprocin-Fluocin-Procin (Fluocinolone Acetonide) Itching  . Dilaudid (Hydromorphone Hcl) Itching  . Morphine Sulfate Itching  . Opium Itching    Social History:  reports that she quit smoking about 15 years ago. Her smoking use included Cigarettes. She has never used smokeless tobacco. She  reports that she does not drink alcohol or use illicit drugs. recently moved to [place]. Lives with her daughter. Not on home oxygen. Uses a cane. Patient's a fall risk.  Family History: Family History  Problem Relation Age of Onset  . Diabetes type II      Physical Exam: There were no vitals filed for this visit.  General:  Alert and oriented times three, well developed and nourished, no acute distress Eyes: PERRLA, pink conjunctiva, no scleral icterus ENT: Moist oral mucosa, neck supple, no thyromegaly Lungs: clear to ascultation, no wheeze, no crackles, no use of accessory muscles Cardiovascular: regular rate and rhythm, no regurgitation, no gallops, no murmurs. No carotid bruits, no JVD Abdomen: soft, positive BS, non-tender, distended, no organomegaly, not an acute abdomen GU: not examined Neuro: CN II - XII grossly intact, sensation intact Musculoskeletal: strength 5/5 all extremities, no clubbing, cyanosis or edema Skin: no rash, no subcutaneous crepitation,  no decubitus Psych: appropriate patient   Labs on Admission:  Results for HARMONIE, VERRASTRO (MRN [id number]) as of 06/05/2011 03:44  Ref. Range 06/05/2011 01:56 06/05/2011 01:57  Sodium Latest Range: 135-145 mEq/L 128 (L)   Potassium Latest Range: 3.5-5.1 mEq/L 4.1   Chloride Latest Range: 96-112 mEq/L 92 (L)   CO2 Latest Range: 19-32 mEq/L 21   BUN Latest Range: 6-23 mg/dL 77 (H)   Creat Latest Range: 0.50-1.10 mg/dL 4.09 (H)   Calcium Latest Range: 8.4-10.5 mg/dL 8.4   GFR calc non Af Amer Latest Range: >90 mL/min 6 (L)   GFR calc Af Amer Latest Range: >90 mL/min 7 (L)   Glucose Latest Range: 70-99 mg/dL 811 (H)   Alkaline Phosphatase Latest Range: 39-117 U/L 184 (H)   Albumin Latest Range: 3.5-5.2 g/dL 3.0 (L)   AST Latest Range: 0-37 U/L 42 (H)   ALT Latest Range: 0-35 U/L 54 (H)   Total Protein Latest Range: 6.0-8.3 g/dL 6.8   Total Bilirubin Latest Range: 0.3-1.2 mg/dL 0.5   Troponin I Latest Range: <0.30 ng/mL   <0.30  Pro B Natriuretic peptide (BNP) Latest Range: 0-125 pg/mL  34667.0 (H)  Cholesterol Latest Range: 0-200 mg/dL  914  Triglycerides Latest Range: <150 mg/dL  98  HDL Latest Range: >39 mg/dL  52  LDL (calc) Latest Range: 0-99 mg/dL  38  VLDL Latest Range: 0-40 mg/dL  20  Total CHOL/HDL Ratio No range found  2.1  Results for JASMARIE, COPPOCK (MRN [id number]) as of 06/05/2011 03:44  Ref. Range 06/05/2011 01:56  WBC Latest Range: 4.0-10.5 K/uL 8.3  RBC Latest Range: 3.87-5.11 MIL/uL 2.66 (L)  HGB Latest Range: 12.0-15.0 g/dL 7.2 (L)  HCT Latest Range: 36.0-46.0 % 22.1 (L)  MCV Latest Range: 78.0-100.0 fL 83.1  MCH Latest Range: 26.0-34.0 pg 27.1  MCHC Latest Range: 30.0-36.0 g/dL 08.6  RDW Latest Range: 11.5-15.5 % 18.2 (H)  Platelets Latest Range: 150-400 K/uL 295  RBC. Latest Range: 3.87-5.11 MIL/uL 2.66 (L)  Retic Ct Pct Latest Range: 0.4-3.1 % 7.0 (H)  Retic Count, Manual Latest Range: 19.0-186.0 K/uL 186.2 (H)    Micro Results: No results found for this or any previous visit (from the past 240 hour(s)).   Radiological Exams on Admission: No results found. Chest x-ray: Fluid overload [imaging from [facility]]  EKG: Normal sinus rhythm  Assessment/Plan Present on Admission:  .Pulmonary edema Chronic kidney disease stage V Patient admitted to step down Nephrologist Dr. Arta Silence aware. He recommended for now Lasix 120 mg IV every 12 hours Oxygen ordered Repeat chest x-ray and BNP in the a.m. 2-D echo ordered Coronary artery disease/history of CHF Status post CABG in October 2012 Chest pains Will cycle cardiac enzymes. Chest pain may be caused by demand ischemia - pt quite anemic, which could also be contributing to patient pulmonary edema Aspirin 325 mg by mouth daily continue Plavix Lipid panel ordered and echo Nitroglycerin paste ordered  Anemia - chronic Anemia panel ordered - Results pending. Chronic anemia likely d/t renal failure ideally patient's hemoglobin  should be greater than 10. Will transfuse a single unit PRBC, for now so as not to aggravate fluid overload and respiratory status Stool Guiac ordered hyponatremia D/t fluid overload Will fluid restrict, ck urine Na, osmolarity, spot urine sodium COPD Former smoker Peripheral nephropathy Diabetes mellitus Hypertension Stable resume home medication ADA diet and sliding scale insulin DuoNeb as needed   Full code DVT prophylaxis Team 4/Dr. Derry Lory, Darvell Monteforte 06/05/2011, 2:35 AM

## 2011-06-06 ENCOUNTER — Inpatient Hospital Stay (HOSPITAL_COMMUNITY): Payer: Medicare Other

## 2011-06-06 LAB — GLUCOSE, CAPILLARY
Glucose-Capillary: 109 mg/dL — ABNORMAL HIGH (ref 70–99)
Glucose-Capillary: 127 mg/dL — ABNORMAL HIGH (ref 70–99)
Glucose-Capillary: 93 mg/dL (ref 70–99)

## 2011-06-06 LAB — CBC
Hemoglobin: 7.6 g/dL — ABNORMAL LOW (ref 12.0–15.0)
MCH: 27.7 pg (ref 26.0–34.0)
RBC: 2.74 MIL/uL — ABNORMAL LOW (ref 3.87–5.11)

## 2011-06-06 LAB — RENAL FUNCTION PANEL
Albumin: 3.1 g/dL — ABNORMAL LOW (ref 3.5–5.2)
BUN: 83 mg/dL — ABNORMAL HIGH (ref 6–23)
Calcium: 8.8 mg/dL (ref 8.4–10.5)
Creatinine, Ser: 6.59 mg/dL — ABNORMAL HIGH (ref 0.50–1.10)
Phosphorus: 8 mg/dL — ABNORMAL HIGH (ref 2.3–4.6)

## 2011-06-06 LAB — PARATHYROID HORMONE, INTACT (NO CA): PTH: 252.9 pg/mL — ABNORMAL HIGH (ref 14.0–72.0)

## 2011-06-06 MED ORDER — DARBEPOETIN ALFA-POLYSORBATE 60 MCG/0.3ML IJ SOLN
60.0000 ug | INTRAMUSCULAR | Status: DC
  Administered 2011-06-06: 60 ug via INTRAVENOUS
  Filled 2011-06-06: qty 0.3

## 2011-06-06 MED ORDER — RENA-VITE PO TABS
1.0000 | ORAL_TABLET | Freq: Every day | ORAL | Status: DC
  Administered 2011-06-06 – 2011-06-10 (×5): 1 via ORAL
  Filled 2011-06-06 (×6): qty 1

## 2011-06-06 MED ORDER — SODIUM CHLORIDE 0.9 % IV SOLN
250.0000 mg | Freq: Every day | INTRAVENOUS | Status: AC
  Administered 2011-06-06 – 2011-06-09 (×3): 250 mg via INTRAVENOUS
  Filled 2011-06-06 (×7): qty 20

## 2011-06-06 NOTE — Progress Notes (Signed)
[facility] [place] TEAM 1 - Stepdown/ICU TEAM  Subjective: Examined during hemodialysis treatment. Denies chest pain or shortness of breath and overall seems to be tolerating dialysis well.  Objective: Blood pressure 154/73, pulse 72, temperature 97.6 F (36.4 C), temperature source Oral, resp. rate 16, height 5\' 6"  (1.676 m), weight 78.6 kg (173 lb 4.5 oz), SpO2 94.00%. Weight change:   Intake/Output Summary (Last 24 hours) at 06/06/11 1227 Last data filed at 06/06/11 1136  Gross per 24 hour  Intake   1334 ml  Output   3681 ml  Net  -2347 ml   Physical Exam: General appearance: alert, cooperative, no distress and morbidly obese Lungs: Mostly clear bilaterally but decreased in the bases - no wheeze Heart: regular rate and sinus rhythm, S1, S2 normal Abdomen: soft, non-tender; bowel sounds normal; no masses,  no organomegaly Extremities: extremities normal, atraumatic, no cyanosis  Lab Results:  Basename 06/06/11 0920 06/05/11 0630  NA 130* 130*  K 3.1* 3.6  CL 90* 93*  CO2 21 21  GLUCOSE 117* 128*  BUN 83* 77*  CREATININE 6.59* 6.32*  CALCIUM 8.8 8.4  MG -- --  PHOS 8.0* --    Basename 06/06/11 0920 06/05/11 0156  AST -- 42*  ALT -- 54*  ALKPHOS -- 184*  BILITOT -- 0.5  PROT -- 6.8  ALBUMIN 3.1* 3.0*    Basename 06/06/11 0920 06/05/11 0630  WBC 7.3 8.3  NEUTROABS -- --  HGB 7.6* 7.4*  HCT 23.1* 22.8*  MCV 84.3 83.8  PLT 306 294    Basename 06/05/11 1323 06/05/11 0630 06/05/11 0157  CKTOTAL -- -- --  CKMB -- -- --  CKMBINDEX -- -- --  TROPONINI <0.30 <0.30 <0.30    Basename 06/05/11 0157  CHOL 110  HDL 52  LDLCALC 38  TRIG 98  CHOLHDL 2.1  LDLDIRECT --    Basename 06/05/11 0630  TSH 1.374  T4TOTAL --  T3FREE --  THYROIDAB --    Basename 06/05/11 1555 06/05/11 0156  VITAMINB12 -- 621  FOLATE -- >20.0  FERRITIN 104 86  TIBC Not calculated due to Iron <10. 259  IRON <10* 13*  RETICCTPCT -- 7.0*    Micro Results: Recent  Results (from the past 240 hour(s))  MRSA PCR SCREENING     Status: Normal   Collection Time   06/05/11  1:10 AM      Component Value Range Status Comment   MRSA by PCR NEGATIVE  NEGATIVE  Final     Assessment/Plan:  Pulmonary edema secondary to grade 2 diastolic heart failure *Improved somewhat with diuresis but anticipate best results with ongoing hemodialysis *ECHO reveals Gr 2 diastolic dysf and EF of 55-60%  Chronic kidney disease (CKD), stage V *Dialysis initiated - so far appears to be tolerating well *Nephrology following *Will discontinue high-dose IV Lasix  Anemia- chronic *Received 2 units with hemodialysis today *Follow CBC *Continue Aranesp and IV iron as directed by nephrology  CAD (coronary artery disease) of bypass graft *Cont ASA, Plavix, Imdur, simvastatin and metoprolol   Peripheral neuropathy *Cont gabapentin  Diabetes mellitus *Sliding scale and lantus- sugars stable today  Hypertension *Continue hydralazine *Watch blood pressure response with hemodialysis-if develops lower blood pressure readings may need to hold antihypertensive medications on dialysis days  COPD (chronic obstructive pulmonary disease) Currently stable  Hyperthyroidism *Continue methimazole *TSH normal 1.374  Disposition *Transfer to 6700 telemetry   LOS: 1 day   ELLIS,ALLISON L. [phone] 06/06/2011, 12:27 PM  I have  personally examined this patient and reviewed the entire database. I have reviewed the above note, made any necessary editorial changes, and agree with its content.  Lonia Blood, MD [facility]

## 2011-06-06 NOTE — Procedures (Signed)
Pt seen on HD. First TX.  AP 110  VP 160.  Receiving blood.  Tolerating HD well.

## 2011-06-06 NOTE — Progress Notes (Signed)
S: Breathing a little better.  Still with occasional nausea O:BP 136/54  Pulse 71  Temp(Src) 99 F (37.2 C) (Oral)  Resp 15  Ht 5\' 6"  (1.676 m)  Wt 82 kg (180 lb 12.4 oz)  BMI 29.18 kg/m2  SpO2 96%  Intake/Output Summary (Last 24 hours) at 06/06/11 0757 Last data filed at 06/06/11 0700  Gross per 24 hour  Intake    931 ml  Output   1878 ml  Net   -947 ml   Weight change:  HYQ:MVHQI and alert CVS:RRR with3/6 systolic murmur Resp:Bil crackles Abd:+ BS NTND Ext:no edema.  LUA AVF + bruit NEURO:Ox3 no asterixis      . amLODipine  10 mg Oral Daily  . aspirin EC  325 mg Oral Daily  . clopidogrel  75 mg Oral Q breakfast  . ferric gluconate (FERRLECIT/NULECIT) IV  250 mg Intravenous Daily  . fluticasone  1 puff Inhalation BID  . furosemide  120 mg Intravenous TID  . gabapentin  100 mg Oral TID  . heparin  5,000 Units Subcutaneous Q8H  . hydrALAZINE  100 mg Oral Q8H  . insulin aspart  0-15 Units Subcutaneous TID WC  . insulin aspart  0-5 Units Subcutaneous QHS  . insulin glargine  30 Units Subcutaneous QHS  . isosorbide mononitrate  60 mg Oral Daily  . lactulose  20 g Oral TID  . methimazole  10 mg Oral TID  . metoprolol succinate  100 mg Oral Daily  . oxyCODONE  10 mg Oral Q12H  . pantoprazole  40 mg Oral Q1200  . simvastatin  20 mg Oral q1800  . DISCONTD: furosemide  120 mg Intravenous BID  . DISCONTD: nitroGLYCERIN  0.5 inch Topical Q6H  . DISCONTD: oxyCODONE  10 mg Oral Q12H   Dg Chest Port 1 View  06/05/2011  *RADIOLOGY REPORT*  Clinical Data: Follow-up CHF.  History of chest pain and shortness of breath.  PORTABLE CHEST - 1 VIEW  Comparison: Yesterday.  Findings: Stable enlargement of the cardiac silhouette and post CABG changes.  Decreased prominence of the pulmonary vasculature and interstitial markings.  No pleural fluid.  Unremarkable bones.  IMPRESSION: Improving CHF with stable cardiomegaly.  Original Report Authenticated By: Darrol Angel, M.D.   Dg Abd  Portable 1v  06/05/2011  *RADIOLOGY REPORT*  Clinical Data: Abdominal distention.  PORTABLE ABDOMEN - 1 VIEW  Comparison: None.  Findings: Normal bowel gas pattern.  Mildly prominent stool. Unremarkable bones.  Bilateral pelvic phleboliths.  IMPRESSION: No acute abnormality.  Mildly prominent stool.  Original Report Authenticated By: Darrol Angel, M.D.   BMET    Component Value Date/Time   NA 130* 06/05/2011 0630   K 3.6 06/05/2011 0630   CL 93* 06/05/2011 0630   CO2 21 06/05/2011 0630   GLUCOSE 128* 06/05/2011 0630   BUN 77* 06/05/2011 0630   CREATININE 6.32* 06/05/2011 0630   CALCIUM 8.4 06/05/2011 0630   GFRNONAA 6* 06/05/2011 0630   GFRAA 7* 06/05/2011 0630   CBC    Component Value Date/Time   WBC 8.3 06/05/2011 0630   RBC 2.72* 06/05/2011 0630   HGB 7.4* 06/05/2011 0630   HCT 22.8* 06/05/2011 0630   PLT 294 06/05/2011 0630   MCV 83.8 06/05/2011 0630   MCH 27.2 06/05/2011 0630   MCHC 32.5 06/05/2011 0630   RDW 18.3* 06/05/2011 0630     Assessment: 1. Uremia 2. Pulm edema 3. Anemia 4. DM 5. HTN 6. CAD Plan: 1. HD today  2. transfuse 3. IV iron 4. Most likely plan HD in AM again 5.  Clip for [facility]   Rogue Pautler T

## 2011-06-06 NOTE — Progress Notes (Signed)
Nutrition Brief Note  RD pulled to pt from nutrition risk report, indicates problems swallowing. Pt denies any problems chewing or swallowing at this time. Denies recent unintentional weight loss. Denies any nutrition needs.  Pt is new HD pt, will need diet education prior to D/C. Pt missed breakfast, waiting for meal at this time. RD will provide education when pt is more receptive.   Clarene Duke MARIE [phone]

## 2011-06-06 NOTE — Progress Notes (Signed)
Utilization Review Completed.Holly Ellison T4/29/2013   

## 2011-06-07 ENCOUNTER — Inpatient Hospital Stay (HOSPITAL_COMMUNITY): Payer: Medicare Other

## 2011-06-07 LAB — RENAL FUNCTION PANEL
CO2: 26 mEq/L (ref 19–32)
Chloride: 94 mEq/L — ABNORMAL LOW (ref 96–112)
Creatinine, Ser: 4.65 mg/dL — ABNORMAL HIGH (ref 0.50–1.10)
GFR calc Af Amer: 10 mL/min — ABNORMAL LOW (ref >90)
GFR calc non Af Amer: 9 mL/min — ABNORMAL LOW (ref >90)
Potassium: 3.1 mEq/L — ABNORMAL LOW (ref 3.5–5.1)
Sodium: 134 mEq/L — ABNORMAL LOW (ref 135–145)

## 2011-06-07 LAB — GLUCOSE, CAPILLARY
Glucose-Capillary: 95 mg/dL (ref 70–99)
Glucose-Capillary: 108 mg/dL — ABNORMAL HIGH (ref 70–99)

## 2011-06-07 LAB — TYPE AND SCREEN
ABO/RH(D): O POS
Antibody Screen: POSITIVE
DAT, IgG: NEGATIVE
PT AG Type: NEGATIVE
Unit division: 0
Unit division: 0

## 2011-06-07 LAB — CBC
Hemoglobin: 10.2 g/dL — ABNORMAL LOW (ref 12.0–15.0)
RBC: 3.76 MIL/uL — ABNORMAL LOW (ref 3.87–5.11)
WBC: 8.8 10*3/uL (ref 4.0–10.5)

## 2011-06-07 MED ORDER — ACETAMINOPHEN 325 MG PO TABS
650.0000 mg | ORAL_TABLET | Freq: Four times a day (QID) | ORAL | Status: DC | PRN
  Administered 2011-06-07 (×2): 650 mg via ORAL
  Filled 2011-06-07 (×2): qty 2

## 2011-06-07 NOTE — Progress Notes (Signed)
S: Breathing  better.  Appetite improving O:BP 140/48  Pulse 76  Temp(Src) 98.9 F (37.2 C) (Oral)  Resp 13  Ht 5\' 6"  (1.676 m)  Wt 78.6 kg (173 lb 4.5 oz)  BMI 27.97 kg/m2  SpO2 92%  Intake/Output Summary (Last 24 hours) at 06/07/11 0818 Last data filed at 06/07/11 0300  Gross per 24 hour  Intake   1250 ml  Output   3154 ml  Net  -1904 ml   Weight change:  NFA:OZHYQ and alert CVS:RRR with3/6 systolic murmur Resp:Bil crackles, improved Abd:+ BS NTND Ext:no edema.  LUA AVF + bruit NEURO:Ox3 no asterixis      . amLODipine  10 mg Oral Daily  . aspirin EC  325 mg Oral Daily  . clopidogrel  75 mg Oral Q breakfast  . darbepoetin  60 mcg Intravenous Q Mon-HD  . ferric gluconate (FERRLECIT/NULECIT) IV  250 mg Intravenous Daily  . fluticasone  1 puff Inhalation BID  . gabapentin  100 mg Oral TID  . heparin  5,000 Units Subcutaneous Q8H  . hydrALAZINE  100 mg Oral Q8H  . insulin aspart  0-15 Units Subcutaneous TID WC  . insulin aspart  0-5 Units Subcutaneous QHS  . insulin glargine  30 Units Subcutaneous QHS  . isosorbide mononitrate  60 mg Oral Daily  . lactulose  20 g Oral TID  . methimazole  10 mg Oral TID  . metoprolol succinate  100 mg Oral Daily  . multivitamin  1 tablet Oral QHS  . oxyCODONE  10 mg Oral Q12H  . pantoprazole  40 mg Oral Q1200  . simvastatin  20 mg Oral q1800  . DISCONTD: furosemide  120 mg Intravenous TID   No results found. BMET    Component Value Date/Time   NA 134* 06/07/2011 0500   K 3.1* 06/07/2011 0500   CL 94* 06/07/2011 0500   CO2 26 06/07/2011 0500   GLUCOSE 90 06/07/2011 0500   BUN 48* 06/07/2011 0500   CREATININE 4.65* 06/07/2011 0500   CALCIUM 8.7 06/07/2011 0500   GFRNONAA 9* 06/07/2011 0500   GFRAA 10* 06/07/2011 0500   CBC    Component Value Date/Time   WBC 8.8 06/07/2011 0455   RBC 3.76* 06/07/2011 0455   HGB 10.2* 06/07/2011 0455   HCT 30.8* 06/07/2011 0455   PLT 329 06/07/2011 0455   MCV 81.9 06/07/2011 0455   MCH 27.1  06/07/2011 0455   MCHC 33.1 06/07/2011 0455   RDW 18.2* 06/07/2011 0455     Assessment: 1. Uremia, improving 2. Pulm edema, improving 3. Anemia 4. DM 5. HTN 6. CAD Plan: 1. HD today 2. Recheck CXR in AM 3. Could transfer out of unit Holly Ellison T

## 2011-06-07 NOTE — Progress Notes (Signed)
[facility] [place] TEAM 1 - Stepdown/ICU TEAM  Subjective: Today patient continues with mild abdominal discomfort and anorexia. Also endorses fatigue post dialysis. No chest pain or shortness of breath. Otherwise in good spirits and talking about her children and grandchildren and great-grandchildren.  Objective: Blood pressure 144/49, pulse 75, temperature 98.4 F (36.9 C), temperature source Oral, resp. rate 13, height 5\' 6"  (1.676 m), weight 78.6 kg (173 lb 4.5 oz), SpO2 93.00%. Weight change:   Intake/Output Summary (Last 24 hours) at 06/07/11 1408 Last data filed at 06/07/11 1200  Gross per 24 hour  Intake    590 ml  Output   1251 ml  Net   -661 ml   Physical Exam: General appearance: alert, cooperative, no distress and somewhat obese Lungs: Mostly clear bilaterally but decreased in the bases - no wheeze Heart: regular rate and sinus rhythm, S1, S2 normal Abdomen: soft, non-tender; bowel sounds normal; no masses,  no organomegaly Extremities: extremities normal, atraumatic, no cyanosis  Lab Results:  Basename 06/07/11 0500 06/06/11 0920  NA 134* 130*  K 3.1* 3.1*  CL 94* 90*  CO2 26 21  GLUCOSE 90 117*  BUN 48* 83*  CREATININE 4.65* 6.59*  CALCIUM 8.7 8.8  MG -- --  PHOS 4.9* 8.0*    Basename 06/07/11 0500 06/06/11 0920 06/05/11 0156  AST -- -- 42*  ALT -- -- 54*  ALKPHOS -- -- 184*  BILITOT -- -- 0.5  PROT -- -- 6.8  ALBUMIN 2.9* 3.1* --    Basename 06/07/11 0455 06/06/11 0920  WBC 8.8 7.3  NEUTROABS -- --  HGB 10.2* 7.6*  HCT 30.8* 23.1*  MCV 81.9 84.3  PLT 329 306    Basename 06/05/11 1323 06/05/11 0630 06/05/11 0157  CKTOTAL -- -- --  CKMB -- -- --  CKMBINDEX -- -- --  TROPONINI <0.30 <0.30 <0.30    Basename 06/05/11 0157  CHOL 110  HDL 52  LDLCALC 38  TRIG 98  CHOLHDL 2.1  LDLDIRECT --    Basename 06/05/11 0630  TSH 1.374  T4TOTAL --  T3FREE --  THYROIDAB --    Basename 06/05/11 1555 06/05/11 0156  VITAMINB12 --  621  FOLATE -- >20.0  FERRITIN 104 86  TIBC Not calculated due to Iron <10. 259  IRON <10* 13*  RETICCTPCT -- 7.0*    Micro Results: Recent Results (from the past 240 hour(s))  MRSA PCR SCREENING     Status: Normal   Collection Time   06/05/11  1:10 AM      Component Value Range Status Comment   MRSA by PCR NEGATIVE  NEGATIVE  Final     Assessment/Plan:  Pulmonary edema secondary to grade 2 diastolic heart failure *Resolved after initiation of dialysis *Improved somewhat with diuresis but anticipate best results with ongoing hemodialysis *ECHO reveals Gr 2 diastolic dysf and EF of 55-60%  Chronic kidney disease (CKD), stage V *Dialysis initiated - so far appears to be tolerating well *Nephrology following *High-dose IV Lasix was discontinued 06/06/1998 but  Anemia- chronic *Received 2 units with hemodialysis 06/06/2011 *Follow CBC: Hemoglobin has increased from 7.6  to 10.2 *Continue Aranesp and IV iron as directed by nephrology  CAD (coronary artery disease) of bypass graft *Cont ASA, Plavix, Imdur, simvastatin and metoprolol   Peripheral neuropathy *Cont gabapentin  Diabetes mellitus *Sliding scale and lantus- sugars stable today  Hypertension *Continue hydralazine *Watch blood pressure response with hemodialysis-if develops lower blood pressure readings may need to hold antihypertensive medications  on dialysis days  COPD (chronic obstructive pulmonary disease) Currently stable  Hyperthyroidism *Continue methimazole *TSH normal 1.374  Disposition *Transfer to 6700    LOS: 2 days   ELLIS,ALLISON L. [phone] 06/07/2011, 2:08 PM  I have examined the patient and reviewed the chart. I agree with the above note which I have modified.   Calvert Cantor, MD [phone]

## 2011-06-07 NOTE — Plan of Care (Signed)
Problem: Food- and Nutrition-Related Knowledge Deficit (NB-1.1) Goal: Nutrition education Formal process to instruct or train a patient/client in a skill or to impart knowledge to help patients/clients voluntarily manage or modify food choices and eating behavior to maintain or improve health.  Outcome: Completed/Met Date Met:  06/07/11 Pt is a new to HD. Pt confirmed that she will likely be continuing HD as an out patient. RD provided pt with renal diet booklet and hand outs. RD went over diet with patient highlighting foods to consume and foods to avoid. Pt asked appropriate questions, RD encouraged pt to notify staff of additional education needs. Chart reviewed, no additional nutrition interventions at this time. Please consult if needed.  Body mass index is 27.97 kg/(m^2). over weight. Diet Renal 80/90, PO intake variable, 10-100%.   Holly Ellison

## 2011-06-08 ENCOUNTER — Inpatient Hospital Stay (HOSPITAL_COMMUNITY): Payer: Medicare Other

## 2011-06-08 ENCOUNTER — Encounter (HOSPITAL_COMMUNITY): Payer: Self-pay | Admitting: Family Medicine

## 2011-06-08 DIAGNOSIS — I25810 Atherosclerosis of coronary artery bypass graft(s) without angina pectoris: Secondary | ICD-10-CM

## 2011-06-08 DIAGNOSIS — N185 Chronic kidney disease, stage 5: Secondary | ICD-10-CM

## 2011-06-08 DIAGNOSIS — E1165 Type 2 diabetes mellitus with hyperglycemia: Secondary | ICD-10-CM

## 2011-06-08 DIAGNOSIS — J810 Acute pulmonary edema: Secondary | ICD-10-CM

## 2011-06-08 DIAGNOSIS — IMO0001 Reserved for inherently not codable concepts without codable children: Secondary | ICD-10-CM

## 2011-06-08 LAB — GLUCOSE, CAPILLARY
Glucose-Capillary: 128 mg/dL — ABNORMAL HIGH (ref 70–99)
Glucose-Capillary: 120 mg/dL — ABNORMAL HIGH (ref 70–99)
Glucose-Capillary: 195 mg/dL — ABNORMAL HIGH (ref 70–99)
Glucose-Capillary: 112 mg/dL — ABNORMAL HIGH (ref 70–99)

## 2011-06-08 MED ORDER — PROMETHAZINE HCL 25 MG/ML IJ SOLN
12.5000 mg | Freq: Four times a day (QID) | INTRAMUSCULAR | Status: DC | PRN
  Administered 2011-06-08: 12.5 mg via INTRAVENOUS
  Filled 2011-06-08: qty 1

## 2011-06-08 MED ORDER — BISACODYL 5 MG PO TBEC
5.0000 mg | DELAYED_RELEASE_TABLET | Freq: Two times a day (BID) | ORAL | Status: DC
  Administered 2011-06-09 – 2011-06-11 (×4): 5 mg via ORAL
  Filled 2011-06-08 (×5): qty 1

## 2011-06-08 MED ORDER — SORBITOL 70 % SOLN
30.0000 mL | Freq: Three times a day (TID) | Status: DC
  Administered 2011-06-08 – 2011-06-10 (×4): 30 mL via ORAL
  Filled 2011-06-08 (×12): qty 30

## 2011-06-08 MED ORDER — KIDNEY FAILURE BOOK
Freq: Once | Status: AC
  Administered 2011-06-08: 06:00:00
  Filled 2011-06-08: qty 1

## 2011-06-08 NOTE — Progress Notes (Signed)
S: Breathing  better.  Vomited this AM.  Up in chair O:BP 158/70  Pulse 85  Temp(Src) 98.1 F (36.7 C) (Oral)  Resp 18  Ht 5\' 6"  (1.676 m)  Wt 76.522 kg (168 lb 11.2 oz)  BMI 27.23 kg/m2  SpO2 95%  Intake/Output Summary (Last 24 hours) at 06/08/11 1140 Last data filed at 06/07/11 1821  Gross per 24 hour  Intake    240 ml  Output   3501 ml  Net  -3261 ml   Weight change: -2.1 kg (-4 lb 10.1 oz) BJY:NWGNF and alert CVS:RRR with3/6 systolic murmur Resp: fewer crackles Abd:+ BS NTND Ext:no edema.  LUA AVF + bruit NEURO:Ox3 no asterixis      . amLODipine  10 mg Oral Daily  . aspirin EC  325 mg Oral Daily  . bisacodyl  5 mg Oral BID  . clopidogrel  75 mg Oral Q breakfast  . darbepoetin  60 mcg Intravenous Q Mon-HD  . ferric gluconate (FERRLECIT/NULECIT) IV  250 mg Intravenous Daily  . fluticasone  1 puff Inhalation BID  . gabapentin  100 mg Oral TID  . heparin  5,000 Units Subcutaneous Q8H  . hydrALAZINE  100 mg Oral Q8H  . insulin aspart  0-15 Units Subcutaneous TID WC  . insulin aspart  0-5 Units Subcutaneous QHS  . insulin glargine  30 Units Subcutaneous QHS  . isosorbide mononitrate  60 mg Oral Daily  . kidney failure book   Does not apply Once  . methimazole  10 mg Oral TID  . metoprolol succinate  100 mg Oral Daily  . multivitamin  1 tablet Oral QHS  . pantoprazole  40 mg Oral Q1200  . simvastatin  20 mg Oral q1800  . sorbitol  30 mL Oral TID  . DISCONTD: lactulose  20 g Oral TID  . DISCONTD: oxyCODONE  10 mg Oral Q12H   Dg Chest Port 1 View  06/08/2011  *RADIOLOGY REPORT*  Clinical Data: Pulmonary edema  PORTABLE CHEST - 1 VIEW  Comparison: 06/05/2011  Findings: Moderate cardiomegaly.  Vascular congestion and edema improved.  No pneumothorax.  No pleural effusion.  IMPRESSION: Improving CHF.  Original Report Authenticated By: Donavan Burnet, M.D.   BMET    Component Value Date/Time   NA 134* 06/07/2011 0500   K 3.1* 06/07/2011 0500   CL 94* 06/07/2011 0500   CO2 26 06/07/2011 0500   GLUCOSE 90 06/07/2011 0500   BUN 48* 06/07/2011 0500   CREATININE 4.65* 06/07/2011 0500   CALCIUM 8.7 06/07/2011 0500   GFRNONAA 9* 06/07/2011 0500   GFRAA 10* 06/07/2011 0500   CBC    Component Value Date/Time   WBC 8.8 06/07/2011 0455   RBC 3.76* 06/07/2011 0455   HGB 10.2* 06/07/2011 0455   HCT 30.8* 06/07/2011 0455   PLT 329 06/07/2011 0455   MCV 81.9 06/07/2011 0455   MCH 27.1 06/07/2011 0455   MCHC 33.1 06/07/2011 0455   RDW 18.2* 06/07/2011 0455     Assessment: 1. Uremia 2. Pulm edema 3. Anemia 4. DM 5. HTN 6. CAD Plan: 1.HD in AM 2. Needs to be up walking halls 3. Awaiting outpt spot   Cleola Perryman T

## 2011-06-08 NOTE — Clinical Social Work Psychosocial (Signed)
Clinical Social Work Department BRIEF PSYCHOSOCIAL ASSESSMENT 06/08/2011  Patient:  Holly Ellison, Holly Ellison     Account Number:  [id number]     Admit date:  06/05/2011  Clinical Social Worker:  Andres Shad  Date/Time:  06/08/2011 02:03 PM  Referred by:  RN  Date Referred:  06/08/2011 Referred for  Transportation assistance   Other Referral:   Patient is a new outpatient diaylsis patient in need of scheduled transportation.   Interview type:  Patient Other interview type:   Dialysis Nurse    PSYCHOSOCIAL DATA Living Status:  FAMILY Admitted from facility:   Level of care:   Primary support name:   Primary support relationship to patient:  FAMILY Degree of support available:   Supportive, but limited due to financial needs and working    CURRENT CONCERNS Current Concerns  Other - See comment   Other Concerns:   Patient is new to  and does not have reliable transportation to and from Dialysis.  Transportation referral needed    SOCIAL WORK ASSESSMENT / PLAN CSW spoke wtih patient at length regarding living situation and transportation.  Patient reports she just moved from [place] where she was living with her husband until she had to put him in a nursing home.  Patient was struggling to care for self, thus moved to [place] to live with her children.  Patient will require outpatient dialysis and is need of scheduled transport.  CSW is working to make referral for [facility] transport in [place].   Assessment/plan status:  Psychosocial Support/Ongoing Assessment of Needs Other assessment/ plan:   Information/referral to community resources:   [facility] elderly transportation.    PATIENT'S/FAMILY'S RESPONSE TO PLAN OF CARE: Family and patient agreeable.  [facility] has been contacted and also agreeable, but working on the schedule for patient and setting up first appointment.     Ashley Jacobs, MSW LCSW [phone]

## 2011-06-08 NOTE — Clinical Documentation Improvement (Signed)
CHF DOCUMENTATION CLARIFICATION QUERY  THIS DOCUMENT IS NOT A PERMANENT PART OF THE MEDICAL RECORD  TO RESPOND TO THE THIS QUERY, FOLLOW THE INSTRUCTIONS BELOW:  1. If needed, update documentation for the patient's encounter via the notes activity.  2. Access this query again and click edit on the In [employer].  3. After updating, or not, click F2 to complete all highlighted (required) fields concerning your review. Select "additional documentation in the medical record" OR "no additional documentation provided".  4. Click Sign note button.  5. The deficiency will fall out of your In Basket *Please let us know if you are not able to complete this workflow by phone or e-mail (listed below).  Please update your documentation within the medical record to reflect your response to this query.                                                                                    06/08/11  Dear Dr.[facility],  In a better effort to capture your patient's severity of illness, reflect appropriate length of stay and utilization of resources, a review of the patient medical record has revealed the following indicators the diagnosis of Heart Failure.  Based on your clinical judgment, please clarify and document in a progress note and/or discharge summary the clinical condition associated with the following supporting information: In responding to this query please exercise your independent judgment.  The fact that a query is asked, does not imply that any particular answer is desired or expected.  Possible Clinical Conditions?  Acute Pulmonary Edema  Acute on Chronic Diastolic Congestive Heart Failure  Other Condition  Cannot Clinically Determine  Supporting Information:  Risk Factors: ckd stage 5, pmh: chf; chronic anemia  Signs & Symptoms: distended abd, sob, cp  Diagnostics: Lab: [id number]  Echo results:Left ventricle: The cavity size was normal. Systolic function  was normal. The estimated ejection fraction was in the range of 55% to 60%. Wall motion was normal; there were no regional wall motion abnormalities. The tissue Doppler parameters were abnormal. Features are consistent with a pseudonormal left ventricular filling pattern, with concomitant abnormal relaxation and increased filling pressure (grade 2 diastolic dysfunction). Doppler parameters are consistent with both elevated ventricular end-diastolic filling pressure and elevated left atrial filling pressure. - Aortic valve: Trivial regurgitation. - Mitral valve: Calcified annulus. Mildly thickened leaflets Mild regurgitation.  - Left atrium: The atrium was moderately dilated. - Atrial septum: No defect or patent foramen ovale was identified EKG: nsr Radiology: IMPRESSION: Improving CHF with stable cardiomegaly Treatment: Dialysis  Reviewed:  no additional documentation provided  Patient had Acute pulmonary edema on admission  Thank You,  Amada Kingfisher RN, BSN, CCM   Clinical Documentation Specialist: [email or web address] [phone]   Health Information Management [place]

## 2011-06-08 NOTE — Progress Notes (Signed)
06/08/2011 1:32 PM Hemodialysis outpatient note; this patient has been accepted at the [facility] dialysis unit on a TTS 2nd shift schedule. The patient is requested to stop by the center on Friday 05.03.13 at 10AM to sign paperwork then begin treatment on Saturday the 4th at 1030. Transportation to and from the center is being arranged by Child psychotherapist.Thank you.Tilman Neat

## 2011-06-08 NOTE — Progress Notes (Signed)
PROGRESS NOTE  Holly Ellison XBJ:[id number] DOB: December 23, 1940 DOA: 06/05/2011 PCP: Lucianne Lei, MD, MD  Brief narrative: 71 yr old female transferred from [facility] 06/05/11 with SOB + Pulm Edema.  Also had some mild chest pains.  Usually doesn't have much CP but this started 1-2 days priro to the SOB.  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 Mildly prominent stool  Chest x-ray 5/1 = improving CHF  Antibiotics:  None   Subjective  Feels okay but does feel tired and nauseous and has a pretty bad headache-these headaches  are usually 1-2 times a month.  Sometimes activated by stress.  No specific SOB right now.  Voice seems a little worse.  Has no appetite. Had n/v at bedisde this am   Objective   Interim History: Reviewed HPI and old notes  Objective: Filed Vitals:   06/07/11 1937 06/07/11 1955 06/08/11 0054 06/08/11 0534  BP: 131/62  128/61 151/58  Pulse: 73 72 76 76  Temp: 99 F (37.2 C)  99.4 F (37.4 C) 98.8 F (37.1 C)  TempSrc: Oral  Oral Oral  Resp: 16 16 16 17   Height: 5\' 6"  (1.676 m)     Weight: 76.522 kg (168 lb 11.2 oz)     SpO2: 94%  97% 91%    Intake/Output Summary (Last 24 hours) at 06/08/11 0858 Last data filed at 06/07/11 1821  Gross per 24 hour  Intake    240 ml  Output   3501 ml  Net  -3261 ml    Exam:  General: Alert pleasant AAF, no apparent distress Cardiovascular: s1 s2 no m/r/g.  RRR, no bruit, no jvd Respiratory: clinically clear.  No added sound Abdomen: soft nt/nd  Skin soft, no swelling Neuro CN 2-12 intact, moving all 4 limbs equally  Data Reviewed: Basic  Metabolic Panel:  Lab 06/07/11 3086 06/06/11 0920 06/05/11 0630 06/05/11 0156  NA 134* 130* 130* 128*  K 3.1* 3.1* -- --  CL 94* 90* 93* 92*  CO2 26 21 21 21   GLUCOSE 90 117* 128* 104*  BUN 48* 83* 77* 77*  CREATININE 4.65* 6.59* 6.32* 6.29*  CALCIUM 8.7 8.8 8.4 8.4  MG -- -- -- --  PHOS 4.9* 8.0* -- --   Liver Function Tests:  Lab 06/07/11 0500 06/06/11 0920 06/05/11 0156  AST -- -- 42*  ALT -- -- 54*  ALKPHOS -- -- 184*  BILITOT -- -- 0.5  PROT -- -- 6.8  ALBUMIN 2.9* 3.1* 3.0*   No results found for this basename: LIPASE:5,AMYLASE:5 in the last 168 hours No results found for this basename: AMMONIA:5 in the last 168 hours CBC:  Lab 06/07/11 0455 06/06/11 0920 06/05/11 0630 06/05/11 0156  WBC 8.8 7.3 8.3 8.3  NEUTROABS -- -- -- --  HGB 10.2* 7.6* 7.4* 7.2*  HCT 30.8* 23.1* 22.8* 22.1*  MCV 81.9 84.3 83.8 83.1  PLT 329 306 294 295   Cardiac Enzymes:  Lab 06/05/11 1323 06/05/11 0630 06/05/11 0157  CKTOTAL -- -- --  CKMB -- -- --  CKMBINDEX -- -- --  TROPONINI <0.30 <0.30 <0.30   BNP: No components found with this basename:  POCBNP:5 CBG:  Lab 06/08/11 0745 06/07/11 1934 06/07/11 1155 06/07/11 0724 06/06/11 2135  GLUCAP 128* 125* 108* 95 93    Recent Results (from the past 240 hour(s))  MRSA PCR SCREENING     Status: Normal   Collection Time   06/05/11  1:10 AM      Component Value Range Status Comment   MRSA by PCR NEGATIVE  NEGATIVE  Final      Studies:              All Imaging reviewed and is as per above notation   Scheduled Meds:   . amLODipine  10 mg Oral Daily  . aspirin EC  325 mg Oral Daily  . clopidogrel  75 mg Oral Q breakfast  . darbepoetin  60 mcg Intravenous Q Mon-HD  . ferric gluconate (FERRLECIT/NULECIT) IV  250 mg Intravenous Daily  . fluticasone  1 puff Inhalation BID  . gabapentin  100 mg Oral TID  . heparin  5,000 Units Subcutaneous Q8H  . hydrALAZINE  100 mg Oral Q8H  . insulin aspart  0-15 Units Subcutaneous TID WC  .  insulin aspart  0-5 Units Subcutaneous QHS  . insulin glargine  30 Units Subcutaneous QHS  . isosorbide mononitrate  60 mg Oral Daily  . kidney failure book   Does not apply Once  . lactulose  20 g Oral TID  . methimazole  10 mg Oral TID  . metoprolol succinate  100 mg Oral Daily  . multivitamin  1 tablet Oral QHS  . oxyCODONE  10 mg Oral Q12H  . pantoprazole  40 mg Oral Q1200  . simvastatin  20 mg Oral q1800   Continuous Infusions:    Assessment/Plan: Pulmonary edema secondary to grade 2 diastolic heart failure  *Resolved after initiation of dialysis  *Improved somewhat with diuresis but anticipate best results with ongoing hemodialysis-is getting CLIP *ECHO reveals Gr 2 diastolic dysf and EF of 55-60%  Chronic kidney disease (CKD), stage V  *Dialysis initiated - so far appears to be tolerating well  *Nephrology following  *High-dose IV Lasix was discontinued 06/06/2011.  Will keep low dose Lasix as per Nephrology.  Anemia- chronic  *Received 2 units with hemodialysis 06/06/2011  *Follow CBC: Hemoglobin has increased from 7.6 to 10.2  *Continue Aranesp and IV iron as directed by nephrology-Rpt CBC in the am CAD (coronary artery disease) of bypass graft  *Cont ASA, Plavix, Imdur, simvastatin and metoprolol  Peripheral neuropathy  *Cont gabapentin  Diabetes mellitus  *Sliding scale and lantus 30 units, SSI- sugars stable today between 120--128 Hypertension  *Continue hydralazine, Norvasc 10 mg daily, Imdur 60 daily, Metoprolol 100 q24hr *Watch blood pressure response with hemodialysis-if develops lower blood pressure readings may need to hold antihypertensive medications on dialysis days  COPD (chronic obstructive pulmonary disease)  Currently stable-continue meds prn Hyperthyroidism  *Continue methimazole  *TSH normal 1.374  N/V/Constipation Likely this am 2/2 to constipation Disposition  *Transfer to 6700    Code Status: FULL Family Communication: None a  bedside Disposition Plan: Home eventually with Dialysis set up   Pleas Koch, MD  [facility] Pager [phone] 06/08/2011, 8:58 AM    LOS: 3 days

## 2011-06-08 NOTE — Progress Notes (Signed)
   CARE MANAGEMENT NOTE 06/08/2011  Patient:  Holly Ellison, Holly Ellison   Account Number:  [id number]  Date Initiated:  06/08/2011  Documentation initiated by:  Letha Cape  Subjective/Objective Assessment:   dx pulmonary edema, n/v  admit- lives with children.  New HD patient.     Action/Plan:   Anticipated DC Date:  06/09/2011   Anticipated DC Plan:  HOME/SELF CARE      DC Planning Services  CM consult      Choice offered to / List presented to:             Status of service:  In process, will continue to follow Medicare Important Message given?   (If response is "NO", the following Medicare IM given date fields will be blank) Date Medicare IM given:   Date Additional Medicare IM given:    Discharge Disposition:    Per UR Regulation:    If discussed at Long Length of Stay Meetings, dates discussed:    Comments:  06/08/11 15:48 Letha Cape RN,BSN [phone] patient lives with children, patient is a new HD patient. Patient has been clipped per Darel Hong in HD.  Patient is having some n/v today, will possible be dc tomorrow.  Patient will be on a Tues, Th, Sat schedule in [place], patient's first day of HD will be 5/4 and patient will need to be at the HD [place] center on 5/3 for paper work.  Informed MD of this information.

## 2011-06-09 ENCOUNTER — Inpatient Hospital Stay (HOSPITAL_COMMUNITY): Payer: Medicare Other

## 2011-06-09 DIAGNOSIS — E1165 Type 2 diabetes mellitus with hyperglycemia: Secondary | ICD-10-CM

## 2011-06-09 DIAGNOSIS — N185 Chronic kidney disease, stage 5: Secondary | ICD-10-CM

## 2011-06-09 DIAGNOSIS — J810 Acute pulmonary edema: Secondary | ICD-10-CM

## 2011-06-09 DIAGNOSIS — I25810 Atherosclerosis of coronary artery bypass graft(s) without angina pectoris: Secondary | ICD-10-CM

## 2011-06-09 LAB — CBC
MCH: 27.4 pg (ref 26.0–34.0)
MCHC: 32.8 g/dL (ref 30.0–36.0)
MCV: 83.3 fL (ref 78.0–100.0)
Platelets: 412 10*3/uL — ABNORMAL HIGH (ref 150–400)
RBC: 4.02 MIL/uL (ref 3.87–5.11)

## 2011-06-09 LAB — RENAL FUNCTION PANEL
Albumin: 3 g/dL — ABNORMAL LOW (ref 3.5–5.2)
BUN: 40 mg/dL — ABNORMAL HIGH (ref 6–23)
Calcium: 9 mg/dL (ref 8.4–10.5)
Chloride: 94 mEq/L — ABNORMAL LOW (ref 96–112)
Creatinine, Ser: 5.89 mg/dL — ABNORMAL HIGH (ref 0.50–1.10)
GFR calc non Af Amer: 7 mL/min — ABNORMAL LOW (ref >90)

## 2011-06-09 LAB — GLUCOSE, CAPILLARY
Glucose-Capillary: 90 mg/dL (ref 70–99)
Glucose-Capillary: 103 mg/dL — ABNORMAL HIGH (ref 70–99)
Glucose-Capillary: 146 mg/dL — ABNORMAL HIGH (ref 70–99)

## 2011-06-09 MED ORDER — SIMVASTATIN 20 MG PO TABS: 20.0000 mg | ORAL_TABLET | Freq: Every day | ORAL | Status: DC

## 2011-06-09 MED ORDER — OXYCODONE HCL 10 MG PO TABS: 10.0000 mg | ORAL_TABLET | Freq: Three times a day (TID) | ORAL | Status: DC

## 2011-06-09 MED ORDER — METHIMAZOLE 10 MG PO TABS: 10.0000 mg | ORAL_TABLET | Freq: Three times a day (TID) | ORAL | Status: AC

## 2011-06-09 MED ORDER — POTASSIUM CHLORIDE ER 10 MEQ PO TBCR: 20.0000 meq | EXTENDED_RELEASE_TABLET | Freq: Every day | ORAL | Status: DC

## 2011-06-09 MED ORDER — INSULIN ASPART 100 UNIT/ML ~~LOC~~ SOLN: 2.0000 [IU] | Freq: Three times a day (TID) | SUBCUTANEOUS | Status: DC

## 2011-06-09 MED ORDER — FLUTICASONE PROPIONATE HFA 110 MCG/ACT IN AERO: 1.0000 | INHALATION_SPRAY | Freq: Two times a day (BID) | RESPIRATORY_TRACT | Status: AC

## 2011-06-09 MED ORDER — BISACODYL 5 MG PO TBEC: 5.0000 mg | DELAYED_RELEASE_TABLET | Freq: Two times a day (BID) | ORAL | Status: AC

## 2011-06-09 MED ORDER — GABAPENTIN 100 MG PO CAPS: 100.0000 mg | ORAL_CAPSULE | Freq: Three times a day (TID) | ORAL | Status: AC

## 2011-06-09 MED ORDER — PROMETHAZINE HCL 12.5 MG PO TABS: 12.5000 mg | ORAL_TABLET | Freq: Four times a day (QID) | ORAL | Status: DC | PRN

## 2011-06-09 MED ORDER — ASPIRIN 325 MG PO TBEC: 325.0000 mg | DELAYED_RELEASE_TABLET | Freq: Every day | ORAL | Status: AC

## 2011-06-09 MED ORDER — SORBITOL 70 % SOLN: 30.0000 mL | Freq: Three times a day (TID) | Status: DC

## 2011-06-09 MED ORDER — INSULIN GLARGINE 100 UNIT/ML ~~LOC~~ SOLN: 30.0000 [IU] | Freq: Every day | SUBCUTANEOUS | Status: DC

## 2011-06-09 NOTE — Plan of Care (Signed)
Problem: Food- and Nutrition-Related Knowledge Deficit (NB-1.1) Goal: Nutrition education Formal process to instruct or train a patient/client in a skill or to impart knowledge to help patients/clients voluntarily manage or modify food choices and eating behavior to maintain or improve health.  Outcome: Completed/Met Date Met:  06/09/11 RD consulted for diet education. Noted pt has been educated on Renal diet on 4/30.  Pt able to verbalize basic understanding of diet education completed on 4/30. Pt with a few additional questions and request for additional handouts. Reviewed Renal Food Pyramid. Choose-A-Meal booklet provided on 4/30. Expect good compliance. Pt is continuing to eat well, consuming 100% of Renal meals. No additional nutrition issues identified at this time.  Adair Laundry Pager#: [phone]

## 2011-06-09 NOTE — Progress Notes (Signed)
S: Breathing  better. No N/V  Feels better and wants to go home O:BP 134/61  Pulse 77  Temp(Src) 98.7 F (37.1 C) (Oral)  Resp 16  Ht 5\' 6"  (1.676 m)  Wt 76.5 kg (168 lb 10.4 oz)  BMI 27.22 kg/m2  SpO2 90%  Intake/Output Summary (Last 24 hours) at 06/09/11 1007 Last data filed at 06/09/11 0800  Gross per 24 hour  Intake    360 ml  Output      0 ml  Net    360 ml   Weight change: -2.1 kg (-4 lb 10.1 oz) ZOX:WRUEA and alert CVS:RRR with3/6 systolic murmur Resp: clear Abd:+ BS NTND Ext:no edema.  LUA AVF + bruit NEURO:Ox3 no asterixis      . amLODipine  10 mg Oral Daily  . aspirin EC  325 mg Oral Daily  . bisacodyl  5 mg Oral BID  . clopidogrel  75 mg Oral Q breakfast  . darbepoetin  60 mcg Intravenous Q Mon-HD  . ferric gluconate (FERRLECIT/NULECIT) IV  250 mg Intravenous Daily  . fluticasone  1 puff Inhalation BID  . gabapentin  100 mg Oral TID  . heparin  5,000 Units Subcutaneous Q8H  . hydrALAZINE  100 mg Oral Q8H  . insulin aspart  0-15 Units Subcutaneous TID WC  . insulin aspart  0-5 Units Subcutaneous QHS  . insulin glargine  30 Units Subcutaneous QHS  . isosorbide mononitrate  60 mg Oral Daily  . methimazole  10 mg Oral TID  . metoprolol succinate  100 mg Oral Daily  . multivitamin  1 tablet Oral QHS  . pantoprazole  40 mg Oral Q1200  . simvastatin  20 mg Oral q1800  . sorbitol  30 mL Oral TID  . DISCONTD: lactulose  20 g Oral TID   Dg Chest Port 1 View  06/08/2011  *RADIOLOGY REPORT*  Clinical Data: Pulmonary edema  PORTABLE CHEST - 1 VIEW  Comparison: 06/05/2011  Findings: Moderate cardiomegaly.  Vascular congestion and edema improved.  No pneumothorax.  No pleural effusion.  IMPRESSION: Improving CHF.  Original Report Authenticated By: Donavan Burnet, M.D.   BMET    Component Value Date/Time   NA 134* 06/07/2011 0500   K 3.1* 06/07/2011 0500   CL 94* 06/07/2011 0500   CO2 26 06/07/2011 0500   GLUCOSE 90 06/07/2011 0500   BUN 48* 06/07/2011 0500   CREATININE 4.65* 06/07/2011 0500   CALCIUM 8.7 06/07/2011 0500   GFRNONAA 9* 06/07/2011 0500   GFRAA 10* 06/07/2011 0500   CBC    Component Value Date/Time   WBC 8.8 06/07/2011 0455   RBC 3.76* 06/07/2011 0455   HGB 10.2* 06/07/2011 0455   HCT 30.8* 06/07/2011 0455   PLT 329 06/07/2011 0455   MCV 81.9 06/07/2011 0455   MCH 27.1 06/07/2011 0455   MCHC 33.1 06/07/2011 0455   RDW 18.2* 06/07/2011 0455     Assessment: 1. Uremia, resolved 2. Pulm edema, improved 3. Anemia 4. DM 5. HTN 6. CAD Plan: 1.HD today 2. She is set up in [facility] for [facility] to start next Tuesday.  Thus she will need to stay here until after HD on SAT.  She is aware.  She will need to be at the kidney center tues at 10:20 am Aven Cegielski T

## 2011-06-09 NOTE — Discharge Summary (Addendum)
[facility] Hospital Discharge Summary   Date of Admission: 06/05/2011 12:56 AM Admitter: @ADMITPROV @   Date of Discharge5/03/2011 Attending Physician: Rhetta Mura, MD  Things to Follow-up on: Needs BMET + CBC in 3-5 days Reassess volume status and daily weights.  Was on Lasix transiently during hospital-d/c Bumex which she was historically on Recommend outpatient PFT's-unclear if h/o COPD/Asthma Had some oozing.  Verbalised to patient to cut back dose of ASA 325 to half of this -please follow with regards to this    Holly Ellison OZH:[id number] DOB: Aug 30, 1940 DOA: 06/05/2011 PCP: Lucianne Lei, MD, MD  Brief narrative: 71 yr old female transferred from [facility] 06/05/11 with SOB + Pulm Edema.  Also had some mild chest pains.  Usually doesn't have much CP but this started 1-2 days priro to the SOB. On admission patient denied fever, chills, lower extremity edema and does urinate but has been urinating less than prior.. Denies palpitations diaphoresis and received nitro drip en route from [facility] which helped with the chest pain that she had patient also complains of chronic constipation and some abdominal distention  Patient admitted and seen by nephrology. They started dialysis 4/28 and she was slightly anemic but because of her volume overload and uremia transfusion was held. Patient was admitted to the step down unit received 2 units of blood and was transferred to the general telemetry. 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 Mildly prominent stool   Chest x-ray 5/1  = improving CHF  Hospital Course by problem list: Pulmonary edema secondary to grade 2 diastolic heart failure  *Resolved after initiation of dialysis-BNP on admission was 34,667  *Improved somewhat with diuresis but anticipate best results with ongoing hemodialysis-is getting CLIP currently and should be able to get dialysis at [facility] dialysis unit on TTS seconds shift schedule. Can be d/c from hospitalist tomorrow for 1st dialysis 5/7  *ECHO reveals Gr 2 diastolic dysf and EF of 55-60% -patient might need diuresis, however this was discontinued during hospitalization and patient was euvolemic at time of discharge  Chronic kidney disease (CKD), stage V  *Dialysis initiated - so far appears to be tolerating well  *Nephrology following  *High-dose IV Lasix was discontinued 06/06/2011. Would hold off of the same, as will have continued Dialysis  Anemia- chronic  *Received 2 units with hemodialysis 06/06/2011  *Follow CBC: Hemoglobin has increased from 7.6 to 10.2 to 11.0  *Continue Aranesp and IV iron as directed by nephrology. Would probably not repeat IV iron unless she drops below 9?  CAD (coronary artery disease) of bypass graft  *Cont ASA, Plavix, Imdur, simvastatin and metoprolol-chest pain initially was probably secondary to volume overload. Her initial point of care markers were all negative  Peripheral neuropathy  *Cont gabapentin  Diabetes mellitus  *Sliding scale and lantus 30 units, SSI- sugars stable today between 120--128. Continue at home on 2 units of insulin with every meal and Lantus 30 units. This may need to be titrated up as an outpatient  Hypertension  *Continue hydralazine, Norvasc 10 mg daily, Imdur 60 daily, Metoprolol 100 q24hr  *Blood pressures initially were transiently  low during dialysis days however resolved. Patient is on numerous medications for her heart and blood pressure which will need to continue  COPD (chronic obstructive pulmonary disease)  Currently  stable-continue meds prn  Hyperthyroidism  *Continue methimazole  *TSH normal 1.374 -recommend a repeat TSH in 6-8 weeks  N/V/Constipation  Likely this am 2/2 to constipation-she had a transient episode of nausea and vomiting which resolved after passage of stool. I changed her to sorbitol 3 times daily Dulcolax twice daily. Patient will need to have adequate water intake   Oozing from Subq heparin site On day priro to d/c patient had some oozing from site of Heparin.  A Regular dressing was applied which soaked through overnight.  Given she was hemodynamically stable and insisted on going home, she should have a CBC on follow up.  If this continues, she should contact dialysis center and inform  them of this and the need for possibly less heparin with dialysis- I did run this by Nephrology Dr. Briant Cedar who also was of the opinion that this was benign and should not prevent her d/c home  Disposition  Home today after dialysis   LOS: 4 days     Procedures Performed and pertinent labs: Dg Chest Port 1 View  06/08/2011  *RADIOLOGY REPORT*  Clinical Data: Pulmonary edema  PORTABLE CHEST - 1 VIEW  Comparison: 06/05/2011  Findings: Moderate cardiomegaly.  Vascular congestion and edema improved.  No pneumothorax.  No pleural effusion.  IMPRESSION: Improving CHF.  Original Report Authenticated By: Donavan Burnet, M.D.   Dg Chest Port 1 View  06/05/2011  *RADIOLOGY REPORT*  Clinical Data: Follow-up CHF.  History of chest pain and shortness of breath.  PORTABLE CHEST - 1 VIEW  Comparison: Yesterday.  Findings: Stable enlargement of the cardiac silhouette and post CABG changes.  Decreased prominence of the pulmonary vasculature and interstitial markings.  No pleural fluid.  Unremarkable bones.  IMPRESSION: Improving CHF with stable cardiomegaly.  Original Report Authenticated By: Darrol Angel, M.D.   Dg Abd Portable 1v  06/05/2011  *RADIOLOGY REPORT*  Clinical Data: Abdominal distention.  PORTABLE  ABDOMEN - 1 VIEW  Comparison: None.  Findings: Normal bowel gas pattern.  Mildly prominent stool. Unremarkable bones.  Bilateral pelvic phleboliths.  IMPRESSION: No acute abnormality.  Mildly prominent stool.  Original Report Authenticated By: Darrol Angel, M.D.    Discharge Vitals & PE:  BP 142/63  Pulse 69  Temp(Src) 98.4 F (36.9 C) (Oral)  Resp 17  Ht 5\' 6"  (1.676 m)  Wt 76.5 kg (168 lb 10.4 oz)  BMI 27.22 kg/m2  SpO2 91%  General: Alert pleasant AAF, no apparent distress Cardiovascular: s1 s2 no m/r/g.  RRR, no bruit, no jvd Respiratory: clinically clear.  No added sound Abdomen: soft nt/nd     Skin soft, no swelling Neuro CN 2-12 intact, moving all 4 limbs equally  Discharge Labs:  Results for orders placed during the hospital encounter of 06/05/11 (from the past 24 hour(s))  GLUCOSE, CAPILLARY     Status: Abnormal   Collection Time   06/08/11 11:41 AM      Component Value Range   Glucose-Capillary 120 (*) 70 - 99 (mg/dL)   Comment 1 Documented in Chart     Comment 2 Notify RN    GLUCOSE, CAPILLARY     Status: Abnormal   Collection Time   06/08/11  4:54 PM      Component Value Range   Glucose-Capillary 195 (*) 70 -  99 (mg/dL)   Comment 1 Documented in Chart     Comment 2 Notify RN    GLUCOSE, CAPILLARY     Status: Abnormal   Collection Time   06/08/11  9:34 PM      Component Value Range   Glucose-Capillary 112 (*) 70 - 99 (mg/dL)   Comment 1 Notify RN     Comment 2 Documented in Chart    GLUCOSE, CAPILLARY     Status: Normal   Collection Time   06/09/11  8:20 AM      Component Value Range   Glucose-Capillary 90  70 - 99 (mg/dL)   Comment 1 Documented in Chart     Comment 2 Notify RN      Disposition and follow-up:   Ms.Joseline Cherney was discharged from in good condition.    Follow-up Appointments: Follow-up Information    Follow up with Lucianne Lei, MD in 5 days.   Contact information:   [address] [place] [address] [phone]               Discharge Medications: Medication List  As of 06/09/2011  8:40 AM   STOP taking these medications         bumetanide 1 MG tablet      HUMALOG 100 UNIT/ML injection      KLOR-CON M20 PO      lactulose 10 GM/15ML solution         TAKE these medications         amLODipine 10 MG tablet   Commonly known as: NORVASC   Take 10 mg by mouth daily.      aspirin 325 MG EC tablet   Take 1 tablet (325 mg total) by mouth daily.      atorvastatin 40 MG tablet   Commonly known as: LIPITOR   Take 40 mg by mouth daily.      bisacodyl 5 MG EC tablet   Commonly known as: DULCOLAX   Take 1 tablet (5 mg total) by mouth 2 (two) times daily.      clopidogrel 75 MG tablet   Commonly known as: PLAVIX   Take 75 mg by mouth daily.      fluticasone 110 MCG/ACT inhaler   Commonly known as: FLOVENT HFA   Inhale 1 puff into the lungs 2 (two) times daily.      gabapentin 100 MG capsule   Commonly known as: NEURONTIN   Take 1 capsule (100 mg total) by mouth 3 (three) times daily.      hydrALAZINE 100 MG tablet   Commonly known as: APRESOLINE   Take 100 mg by mouth 3 (three) times daily.      insulin aspart 100 UNIT/ML injection   Commonly known as: novoLOG   Inject 2 Units into the skin 3 (three) times daily with meals.      insulin glargine 100 UNIT/ML injection   Commonly known as: LANTUS   Inject 30 Units into the skin at bedtime.      isosorbide mononitrate 120 MG 24 hr tablet   Commonly known as: IMDUR   Take 120 mg by mouth daily.      methimazole 10 MG tablet   Commonly known as: TAPAZOLE   Take 1 tablet (10 mg total) by mouth 3 (three) times daily.      Oxycodone HCl 10 MG Tabs   Take 1 tablet (10 mg total) by mouth every 8 (eight) hours.  pantoprazole 40 MG tablet   Commonly known as: PROTONIX   Take 40 mg by mouth daily.      promethazine 12.5 MG tablet   Commonly known as: PHENERGAN   Take 1 tablet (12.5 mg total) by mouth every 6 (six) hours as needed  for nausea.      simvastatin 20 MG tablet   Commonly known as: ZOCOR   Take 1 tablet (20 mg total) by mouth daily at 6 PM.      sorbitol 70 % Soln   Take 30 mLs by mouth 3 (three) times daily.      Vitamin D (Ergocalciferol) 50000 UNITS Caps   Commonly known as: DRISDOL   Take 50,000 Units by mouth every 30 (thirty) days.         ASK your doctor about these medications         ATORVASTATIN CALCIUM PO   Take 40 mg by mouth at bedtime.      metolazone 2.5 MG tablet   Commonly known as: ZAROXOLYN   Take 2.5 mg by mouth daily as needed. For swelling           Medications Discontinued During This Encounter  Medication Reason  . HYDROmorphone (DILAUDID) injection 0.5 mg Allergic reaction  . nitroGLYCERIN 0.2 mg/mL in dextrose 5 % infusion   . insulin lispro (HUMALOG) 100 UNIT/ML injection   . oxyCODONE (OXYCONTIN) 12 hr tablet 10 mg   . furosemide (LASIX) 120 mg in dextrose 5 % 50 mL IVPB   . nitroGLYCERIN (NITROGLYN) 2 % ointment 0.5 inch   . furosemide (LASIX) 120 mg in dextrose 5 % 50 mL IVPB   . isosorbide mononitrate (IMDUR) 60 MG 24 hr tablet Dose change  . HYDRALAZINE HCL PO Entry Error  . Vitamin D, Ergocalciferol, (DRISDOL) 50000 UNITS CAPS Inpatient Standard  . metolazone (ZAROXOLYN) 2.5 MG tablet Inpatient Standard  . pantoprazole (PROTONIX) 20 MG tablet Dose change  . PANTOPRAZOLE SODIUM PO Duplicate  . CLOPIDOGREL BISULFATE PO Entry Error  . AMLODIPINE BESYLATE PO Entry Error  . ATORVASTATIN CALCIUM PO Entry Error  . insulin glargine (LANTUS) 100 UNIT/ML injection Entry Error  . acetaminophen (TYLENOL) 325 MG tablet No longer needed (for PRN medications)  . OXYCODONE HCL PO Entry Error  . aspirin EC 81 MG tablet Patient has not taken in last 30 days  . bisacodyl (DULCOLAX) 5 MG EC tablet No longer needed (for PRN medications)  . darbepoetin (ARANESP, ALBUMIN FREE,) 40 MCG/0.4ML SOLN Patient has not taken in last 30 days  . fluticasone (FLOVENT HFA) 110  MCG/ACT inhaler Patient has not taken in last 30 days  . gabapentin (NEURONTIN) 100 MG capsule Patient has not taken in last 30 days  . ipratropium-albuterol (DUONEB) 0.5-2.5 (3) MG/3ML SOLN Patient has not taken in last 30 days  . methimazole (TAPAZOLE) 10 MG tablet Patient has not taken in last 30 days  . metoprolol succinate (TOPROL-XL) 100 MG 24 hr tablet Patient has not taken in last 30 days  . oxychlorosene (CLORPACTIN WCS 90) powder Patient has not taken in last 30 days  . Sennosides-Docusate Sodium (SENNA PLUS PO) Patient has not taken in last 30 days  . oxyCODONE (OXYCONTIN) 12 hr tablet 10 mg   . lactulose (CHRONULAC) 10 GM/15ML solution 20 g   . Oxycodone HCl 10 MG TABS   . insulin glargine (LANTUS SOLOSTAR) 100 UNIT/ML injection   . bumetanide (BUMEX) 1 MG tablet Stop Taking at Discharge  . Potassium Chloride  Crys CR (KLOR-CON M20 PO) Stop Taking at Discharge  . lactulose (CHRONULAC) 10 GM/15ML solution Stop Taking at Discharge    > 40 minutes time spent preparing d/c summary, including direct face-face patient Time, contact with consultants, family and care coordination   Signed: Jonella Redditt,JAI 06/09/2011, 8:40 AM

## 2011-06-10 DIAGNOSIS — E1165 Type 2 diabetes mellitus with hyperglycemia: Secondary | ICD-10-CM

## 2011-06-10 DIAGNOSIS — I25810 Atherosclerosis of coronary artery bypass graft(s) without angina pectoris: Secondary | ICD-10-CM

## 2011-06-10 DIAGNOSIS — N185 Chronic kidney disease, stage 5: Secondary | ICD-10-CM

## 2011-06-10 DIAGNOSIS — J810 Acute pulmonary edema: Secondary | ICD-10-CM

## 2011-06-10 LAB — GLUCOSE, CAPILLARY
Glucose-Capillary: 78 mg/dL (ref 70–99)
Glucose-Capillary: 111 mg/dL — ABNORMAL HIGH (ref 70–99)
Glucose-Capillary: 125 mg/dL — ABNORMAL HIGH (ref 70–99)
Glucose-Capillary: 193 mg/dL — ABNORMAL HIGH (ref 70–99)

## 2011-06-10 MED ORDER — SODIUM CHLORIDE 0.9 % IV SOLN
250.0000 mg | Freq: Once | INTRAVENOUS | Status: AC
  Administered 2011-06-10: 250 mg via INTRAVENOUS
  Filled 2011-06-10 (×2): qty 20

## 2011-06-10 NOTE — Progress Notes (Signed)
Patient not d/c 5/2 2/2 to need to arrange dialysis center   PROGRESS NOTE  Holly Ellison ZOX:[id number] DOB: [birth year] DOA: 06/05/2011 PCP: Lucianne Lei, MD, MD  Brief narrative: 71 yr old female transferred from [facility] 06/05/11 with SOB + Pulm Edema.  Also had some mild chest pains.  Usually doesn't have much CP but this started 1-2 days priro to the SOB.  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 Mildly prominent stool  Chest x-ray 5/1 = improving CHF  Antibiotics:  None   Subjective  Well.  Has some oozing around her Haparin inj site last pm.  No other bleeding.  Has been oob.  No further concerns   Objective   Interim History: Reviewed HPI and old notes  Objective: Filed Vitals:   06/10/11 0603 06/10/11 0758 06/10/11 1000 06/10/11 1400  BP: 143/44  149/59 149/70  Pulse: 65  73 70  Temp: 97.8 F (36.6 C)  99.1 F (37.3 C) 97.9 F (36.6 C)  TempSrc: Oral  Oral Oral  Resp: 17  18 20   Height:      Weight:      SpO2: 96% 95% 95% 97%    Intake/Output Summary (Last 24 hours) at 06/10/11 1532 Last data filed at 06/10/11 1300  Gross per 24 hour  Intake   1550 ml  Output      0 ml  Net   1550 ml    Exam:  General: Alert pleasant AAF, no apparent distress Cardiovascular: s1 s2 no m/r/g.  RRR, no bruit, no jvd Respiratory: clinically clear.  No added sound Abdomen: soft nt/nd  Skin soft, no swelling.  Some soaking through of the dressing. Neuro CN 2-12 intact, moving all 4 limbs equally  Data Reviewed: Basic Metabolic Panel:  Lab 06/09/11 8119 06/07/11 0500 06/06/11 0920 06/05/11 0630  06/05/11 0156  NA 131* 134* 130* 130* 128*  K 3.8 3.1* -- -- --  CL 94* 94* 90* 93* 92*  CO2 24 26 21 21 21   GLUCOSE 96 90 117* 128* 104*  BUN 40* 48* 83* 77* 77*  CREATININE 5.89* 4.65* 6.59* 6.32* 6.29*  CALCIUM 9.0 8.7 8.8 8.4 8.4  MG -- -- -- -- --  PHOS 3.8 4.9* 8.0* -- --   Liver Function Tests:  Lab 06/09/11 1147 06/07/11 0500 06/06/11 0920 06/05/11 0156  AST -- -- -- 42*  ALT -- -- -- 54*  ALKPHOS -- -- -- 184*  BILITOT -- -- -- 0.5  PROT -- -- -- 6.8  ALBUMIN 3.0* 2.9* 3.1* 3.0*   No results found for this basename: LIPASE:5,AMYLASE:5 in the last 168 hours No results found for this basename: AMMONIA:5 in the last 168 hours CBC:  Lab 06/09/11 1147 06/07/11 0455 06/06/11 0920 06/05/11 0630 06/05/11 0156  WBC 9.6 8.8 7.3 8.3 8.3  NEUTROABS -- -- -- -- --  HGB 11.0* 10.2* 7.6* 7.4* 7.2*  HCT 33.5* 30.8* 23.1* 22.8* 22.1*  MCV 83.3 81.9 84.3 83.8 83.1  PLT 412* 329 306 294 295   Cardiac Enzymes:  Lab 06/05/11 1323 06/05/11 0630 06/05/11 0157  CKTOTAL -- -- --  CKMB -- -- --  CKMBINDEX -- -- --  TROPONINI <0.30 <0.30 <0.30   BNP: No components found with this basename: POCBNP:5 CBG:  Lab 06/10/11 1214 06/10/11 0755 06/09/11 2124 06/09/11 1604 06/09/11 0820  GLUCAP 111* 78 146* 103* 90    Recent Results (from the past 240 hour(s))  MRSA PCR SCREENING     Status: Normal   Collection Time   06/05/11  1:10 AM      Component Value Range Status Comment   MRSA by PCR NEGATIVE  NEGATIVE  Final      Studies:              All Imaging reviewed and is as per above notation   Scheduled Meds:    . amLODipine  10 mg Oral Daily  . aspirin EC  325 mg Oral Daily  . bisacodyl  5 mg Oral BID  . clopidogrel  75 mg Oral Q breakfast  . darbepoetin  60 mcg Intravenous Q Mon-HD  . ferric gluconate (FERRLECIT/NULECIT) IV  250 mg Intravenous Daily  . ferric gluconate (FERRLECIT/NULECIT) IV  250 mg Intravenous Once  . fluticasone  1 puff Inhalation BID  . gabapentin  100  mg Oral TID  . hydrALAZINE  100 mg Oral Q8H  . insulin aspart  0-15 Units Subcutaneous TID WC  . insulin aspart  0-5 Units Subcutaneous QHS  . insulin glargine  30 Units Subcutaneous QHS  . isosorbide mononitrate  60 mg Oral Daily  . methimazole  10 mg Oral TID  . metoprolol succinate  100 mg Oral Daily  . multivitamin  1 tablet Oral QHS  . pantoprazole  40 mg Oral Q1200  . simvastatin  20 mg Oral q1800  . sorbitol  30 mL Oral TID  . DISCONTD: heparin  5,000 Units Subcutaneous Q8H   Continuous Infusions:    Assessment/Plan:  Pulmonary edema secondary to grade 2 diastolic heart failure  *Resolved after initiation of dialysis-BNP on admission was 34,667  *Improved somewhat with diuresis but anticipate best results with ongoing hemodialysis-is getting CLIP currently and should be able to get dialysis at [facility] dialysis unit on TTS seconds shift schedule. Can be d/c from hospitalist tomorrow for 1st dialysis 5/7 *ECHO reveals Gr 2 diastolic dysf and EF of 55-60% -patient might need diuresis, however this was discontinued during hospitalization and patient was euvolemic at time of discharge  Chronic kidney disease (CKD), stage V  *Dialysis initiated - so far appears to be tolerating well  *Nephrology following  *High-dose IV Lasix was discontinued 06/06/2011.  Would hold off of the same, as will have continued Dialysis Anemia- chronic  *Received 2 units with hemodialysis 06/06/2011  *Follow CBC: Hemoglobin has increased from 7.6 to 10.2 to 11.0 *Continue Aranesp and IV iron as directed by nephrology.  Would probably not repeat IV iron unless she drops below 9? CAD (coronary artery disease) of bypass graft  *Cont ASA, Plavix, Imdur, simvastatin and metoprolol-chest pain initially was probably secondary to volume overload. Her initial point of care markers were all negative  Peripheral neuropathy  *Cont gabapentin  Diabetes mellitus  *Sliding scale and lantus 30 units, SSI- sugars  stable today between 120--128. Continue at home on 2 units of insulin with every meal and Lantus 30 units. This may need to be titrated up as an outpatient  Hypertension  *Continue hydralazine, Norvasc 10 mg daily, Imdur 60 daily, Metoprolol 100 q24hr  *Blood pressures initially were transiently low during dialysis days however resolved.  Patient is on numerous medications  for her heart and blood pressure which will need to continue COPD (chronic obstructive pulmonary disease)  Currently stable-continue meds prn  Hyperthyroidism  *Continue methimazole  *TSH normal 1.374 -recommend a repeat TSH in 6-8 weeks  N/V/Constipation  Likely this am 2/2 to constipation-she had a transient episode of nausea and vomiting which resolved after passage of stool. I changed her to sorbitol 3 times daily Dulcolax twice daily. Patient will need to have adequate water intake  Disposition  Home today after dialysis    Code Status: FULL Family Communication: None a bedside Disposition Plan: Home eventually with Dialysis set up   Pleas Koch, MD  [facility] Pager [phone] 06/10/2011, 3:32 PM    LOS: 5 days

## 2011-06-10 NOTE — Progress Notes (Signed)
Pt has some small amount of bleeding from site where she got her Heparin injection last night. Heparin SQ was d/c'd. Skin was cleaned, pt was given a new gown, and foam allevyn dressing applied. Will continue to monitor

## 2011-06-10 NOTE — Progress Notes (Signed)
S:No new CO except some cramping with HD yest. O:BP 143/44  Pulse 65  Temp(Src) 97.8 F (36.6 C) (Oral)  Resp 17  Ht 5\' 6"  (1.676 m)  Wt 75.5 kg (166 lb 7.2 oz)  BMI 26.87 kg/m2  SpO2 95%  Intake/Output Summary (Last 24 hours) at 06/10/11 1025 Last data filed at 06/10/11 0600  Gross per 24 hour  Intake   1430 ml  Output   3102 ml  Net  -1672 ml   Weight change: 1.8 kg (3 lb 15.5 oz) ZOX:WRUEA and alert CVS:RRR with3/6 systolic murmur Resp: clear Abd:+ BS NTND Ext:no edema.  LUA AVF + bruit NEURO:Ox3 no asterixis      . amLODipine  10 mg Oral Daily  . aspirin EC  325 mg Oral Daily  . bisacodyl  5 mg Oral BID  . clopidogrel  75 mg Oral Q breakfast  . darbepoetin  60 mcg Intravenous Q Mon-HD  . ferric gluconate (FERRLECIT/NULECIT) IV  250 mg Intravenous Daily  . fluticasone  1 puff Inhalation BID  . gabapentin  100 mg Oral TID  . heparin  5,000 Units Subcutaneous Q8H  . hydrALAZINE  100 mg Oral Q8H  . insulin aspart  0-15 Units Subcutaneous TID WC  . insulin aspart  0-5 Units Subcutaneous QHS  . insulin glargine  30 Units Subcutaneous QHS  . isosorbide mononitrate  60 mg Oral Daily  . methimazole  10 mg Oral TID  . metoprolol succinate  100 mg Oral Daily  . multivitamin  1 tablet Oral QHS  . pantoprazole  40 mg Oral Q1200  . simvastatin  20 mg Oral q1800  . sorbitol  30 mL Oral TID   No results found. BMET    Component Value Date/Time   NA 131* 06/09/2011 1147   K 3.8 06/09/2011 1147   CL 94* 06/09/2011 1147   CO2 24 06/09/2011 1147   GLUCOSE 96 06/09/2011 1147   BUN 40* 06/09/2011 1147   CREATININE 5.89* 06/09/2011 1147   CALCIUM 9.0 06/09/2011 1147   GFRNONAA 7* 06/09/2011 1147   GFRAA 8* 06/09/2011 1147   CBC    Component Value Date/Time   WBC 9.6 06/09/2011 1147   RBC 4.02 06/09/2011 1147   HGB 11.0* 06/09/2011 1147   HCT 33.5* 06/09/2011 1147   PLT 412* 06/09/2011 1147   MCV 83.3 06/09/2011 1147   MCH 27.4 06/09/2011 1147   MCHC 32.8 06/09/2011 1147   RDW 18.0* 06/09/2011 1147      Assessment: 1. Uremia, resolved 2. Pulm edema, improved 3. Anemia 4. DM 5. HTN 6. CAD Plan: 1.HD in AM 2. She is set up in [facility] for [facility] to start next Tuesday.  Thus she will need to stay here until after HD on SAT.  She is aware.  She will need to be at the kidney center tues at 10:20 am 3. DC SQ heparin Dmya Long T

## 2011-06-11 ENCOUNTER — Observation Stay (HOSPITAL_COMMUNITY): Payer: Medicare Other

## 2011-06-11 DIAGNOSIS — N185 Chronic kidney disease, stage 5: Secondary | ICD-10-CM

## 2011-06-11 DIAGNOSIS — E1165 Type 2 diabetes mellitus with hyperglycemia: Secondary | ICD-10-CM

## 2011-06-11 DIAGNOSIS — I25810 Atherosclerosis of coronary artery bypass graft(s) without angina pectoris: Secondary | ICD-10-CM

## 2011-06-11 DIAGNOSIS — J810 Acute pulmonary edema: Secondary | ICD-10-CM

## 2011-06-11 LAB — BASIC METABOLIC PANEL
BUN: 43 mg/dL — ABNORMAL HIGH (ref 6–23)
Calcium: 9.2 mg/dL (ref 8.4–10.5)
Creatinine, Ser: 6.62 mg/dL — ABNORMAL HIGH (ref 0.50–1.10)
GFR calc Af Amer: 7 mL/min — ABNORMAL LOW (ref >90)
GFR calc non Af Amer: 6 mL/min — ABNORMAL LOW (ref >90)
Glucose, Bld: 86 mg/dL (ref 70–99)

## 2011-06-11 MED ORDER — METOPROLOL SUCCINATE ER 100 MG PO TB24: 100.0000 mg | ORAL_TABLET | Freq: Every day | ORAL | Status: AC

## 2011-06-11 NOTE — Procedures (Signed)
Pt seen on HD.  Ap 110  Vp 180.  Will use wt after HD today as her DW.  For DC after HD

## 2011-10-29 ENCOUNTER — Inpatient Hospital Stay (HOSPITAL_COMMUNITY)
Admission: AD | Admit: 2011-10-29 | Discharge: 2011-11-02 | DRG: 682 | Disposition: A | Discharge status: Home / Self Care | Payer: Medicare Other | Source: Other Acute Inpatient Hospital | Attending: Internal Medicine | Admitting: Internal Medicine

## 2011-10-29 ENCOUNTER — Encounter (HOSPITAL_COMMUNITY): Payer: Self-pay | Admitting: Internal Medicine

## 2011-10-29 DIAGNOSIS — N186 End stage renal disease: Secondary | ICD-10-CM | POA: Diagnosis present

## 2011-10-29 DIAGNOSIS — Z87891 Personal history of nicotine dependence: Secondary | ICD-10-CM

## 2011-10-29 DIAGNOSIS — R0789 Other chest pain: Secondary | ICD-10-CM | POA: Diagnosis present

## 2011-10-29 DIAGNOSIS — G629 Polyneuropathy, unspecified: Secondary | ICD-10-CM

## 2011-10-29 DIAGNOSIS — I161 Hypertensive emergency: Secondary | ICD-10-CM | POA: Diagnosis present

## 2011-10-29 DIAGNOSIS — J4489 Other specified chronic obstructive pulmonary disease: Secondary | ICD-10-CM | POA: Diagnosis present

## 2011-10-29 DIAGNOSIS — J811 Chronic pulmonary edema: Secondary | ICD-10-CM

## 2011-10-29 DIAGNOSIS — Z992 Dependence on renal dialysis: Secondary | ICD-10-CM

## 2011-10-29 DIAGNOSIS — E213 Hyperparathyroidism, unspecified: Secondary | ICD-10-CM | POA: Diagnosis present

## 2011-10-29 DIAGNOSIS — G510 Bell's palsy: Secondary | ICD-10-CM | POA: Diagnosis present

## 2011-10-29 DIAGNOSIS — Z794 Long term (current) use of insulin: Secondary | ICD-10-CM

## 2011-10-29 DIAGNOSIS — I252 Old myocardial infarction: Secondary | ICD-10-CM

## 2011-10-29 DIAGNOSIS — J449 Chronic obstructive pulmonary disease, unspecified: Secondary | ICD-10-CM | POA: Diagnosis present

## 2011-10-29 DIAGNOSIS — I2510 Atherosclerotic heart disease of native coronary artery without angina pectoris: Secondary | ICD-10-CM | POA: Diagnosis present

## 2011-10-29 DIAGNOSIS — L98499 Non-pressure chronic ulcer of skin of other sites with unspecified severity: Secondary | ICD-10-CM

## 2011-10-29 DIAGNOSIS — E0590 Thyrotoxicosis, unspecified without thyrotoxic crisis or storm: Secondary | ICD-10-CM | POA: Diagnosis present

## 2011-10-29 DIAGNOSIS — N185 Chronic kidney disease, stage 5: Secondary | ICD-10-CM | POA: Diagnosis present

## 2011-10-29 DIAGNOSIS — E119 Type 2 diabetes mellitus without complications: Secondary | ICD-10-CM | POA: Diagnosis present

## 2011-10-29 DIAGNOSIS — I10 Essential (primary) hypertension: Secondary | ICD-10-CM

## 2011-10-29 DIAGNOSIS — Z951 Presence of aortocoronary bypass graft: Secondary | ICD-10-CM

## 2011-10-29 DIAGNOSIS — I25810 Atherosclerosis of coronary artery bypass graft(s) without angina pectoris: Secondary | ICD-10-CM | POA: Diagnosis present

## 2011-10-29 DIAGNOSIS — R079 Chest pain, unspecified: Secondary | ICD-10-CM | POA: Diagnosis present

## 2011-10-29 DIAGNOSIS — I120 Hypertensive chronic kidney disease with stage 5 chronic kidney disease or end stage renal disease: Principal | ICD-10-CM | POA: Diagnosis present

## 2011-10-29 MED ORDER — SODIUM CHLORIDE 0.9 % IV SOLN: 250.0000 mL | INTRAVENOUS | Status: DC | PRN

## 2011-10-29 MED ORDER — ENOXAPARIN SODIUM 30 MG/0.3ML ~~LOC~~ SOLN
30.0000 mg | Freq: Every day | SUBCUTANEOUS | Status: DC
  Administered 2011-10-30 – 2011-11-02 (×4): 30 mg via SUBCUTANEOUS
  Filled 2011-10-29 (×4): qty 0.3

## 2011-10-29 MED ORDER — ZOLPIDEM TARTRATE 5 MG PO TABS
5.0000 mg | ORAL_TABLET | Freq: Every evening | ORAL | Status: DC | PRN
  Administered 2011-10-30 (×2): 5 mg via ORAL
  Filled 2011-10-29 (×2): qty 1

## 2011-10-29 MED ORDER — NITROGLYCERIN 2 % TD OINT
1.0000 [in_us] | TOPICAL_OINTMENT | Freq: Four times a day (QID) | TRANSDERMAL | Status: DC
  Administered 2011-10-30 – 2011-11-01 (×11): 1 [in_us] via TOPICAL
  Filled 2011-10-29: qty 30

## 2011-10-29 MED ORDER — SODIUM CHLORIDE 0.9 % IJ SOLN: 3.0000 mL | INTRAMUSCULAR | Status: DC | PRN

## 2011-10-29 MED ORDER — HYDRALAZINE HCL 20 MG/ML IJ SOLN
10.0000 mg | Freq: Four times a day (QID) | INTRAMUSCULAR | Status: DC | PRN
  Administered 2011-10-30 – 2011-10-31 (×3): 10 mg via INTRAVENOUS
  Filled 2011-10-29 (×5): qty 0.5

## 2011-10-29 MED ORDER — INSULIN ASPART 100 UNIT/ML ~~LOC~~ SOLN
0.0000 [IU] | Freq: Three times a day (TID) | SUBCUTANEOUS | Status: DC
  Administered 2011-11-01: 3 [IU] via SUBCUTANEOUS
  Administered 2011-11-02: 2 [IU] via SUBCUTANEOUS

## 2011-10-29 MED ORDER — ACETAMINOPHEN 650 MG RE SUPP: 650.0000 mg | Freq: Four times a day (QID) | RECTAL | Status: DC | PRN

## 2011-10-29 MED ORDER — INSULIN ASPART 100 UNIT/ML ~~LOC~~ SOLN: 0.0000 [IU] | Freq: Every day | SUBCUTANEOUS | Status: DC

## 2011-10-29 MED ORDER — ALUM & MAG HYDROXIDE-SIMETH 200-200-20 MG/5ML PO SUSP: 30.0000 mL | Freq: Four times a day (QID) | ORAL | Status: DC | PRN

## 2011-10-29 MED ORDER — ONDANSETRON HCL 4 MG PO TABS
4.0000 mg | ORAL_TABLET | Freq: Four times a day (QID) | ORAL | Status: DC | PRN
  Administered 2011-10-30 – 2011-10-31 (×3): 4 mg via ORAL
  Filled 2011-10-29 (×3): qty 1

## 2011-10-29 MED ORDER — ASPIRIN EC 325 MG PO TBEC
325.0000 mg | DELAYED_RELEASE_TABLET | Freq: Every day | ORAL | Status: DC
  Administered 2011-10-30 – 2011-11-02 (×4): 325 mg via ORAL
  Filled 2011-10-29 (×4): qty 1

## 2011-10-29 MED ORDER — SODIUM CHLORIDE 0.9 % IJ SOLN
3.0000 mL | Freq: Two times a day (BID) | INTRAMUSCULAR | Status: DC
  Administered 2011-10-30 – 2011-11-02 (×8): 3 mL via INTRAVENOUS

## 2011-10-29 MED ORDER — ONDANSETRON HCL 4 MG/2ML IJ SOLN
4.0000 mg | Freq: Four times a day (QID) | INTRAMUSCULAR | Status: DC | PRN
  Administered 2011-10-30: 4 mg via INTRAVENOUS
  Filled 2011-10-29: qty 2

## 2011-10-29 MED ORDER — ACETAMINOPHEN 325 MG PO TABS
650.0000 mg | ORAL_TABLET | Freq: Four times a day (QID) | ORAL | Status: DC | PRN
  Administered 2011-10-30: 650 mg via ORAL
  Filled 2011-10-29: qty 2

## 2011-10-29 NOTE — H&P (Signed)
[facility] History and Physical  Dollene Mallery ZOX:[id number] DOB: [birth year] DOA: 10/29/2011  Referring physician: EDP at [facility] PCP: Lucianne Lei, MD   Chief Complaint: Chest Pain  HPI: Harumi Yamin is a 71 y.o. female with multiple medical problems including CAD, who was having her dialysis treatment today and began to have chest pain.  She rated the pain as an 8/10 and the pain was described as dull and constant and located in the left side of her chest and in the left side of her body.  She had associated symptoms of severe SOB, and nausea but no vomiting or diaphoresis.  She was taken to the [facility] ED and evaluated.  The pain resolved on its own in 20 minutes.     Review of Systems: The patient denies anorexia, fever, weight loss, vision loss, decreased hearing, hoarseness, syncope, dyspnea on exertion, peripheral edema, balance deficits, hemoptysis, abdominal pain, melena, hematochezia, severe indigestion/heartburn, hematuria, incontinence, genital sores, muscle weakness, suspicious skin lesions, transient blindness, difficulty walking, depression, unusual weight change, abnormal bleeding, enlarged lymph nodes, angioedema, and breast masses.    Past Medical History  Diagnosis Date  . Chronic kidney disease     just found out? Placed graft in [facility]-Dr. Orvan Falconer in [facility].  started dialysis in 06/2011  . Arthritis     Osteoarthritis  . Neuropathy in diabetes   . Insomnia   . Asthma   . CAD (coronary artery disease) 2012    7 stents-First PCI was in [place].  The last time she had a procedure was  a stent last year, thena  CABG was undertaken in October [facility])  . GERD (gastroesophageal reflux disease)   . Diabetes mellitus     Type II  . COPD (chronic obstructive pulmonary disease)   . Hypertension   . Peripheral vascular disease     endarterectomy  . Anemia   . CHF (congestive heart failure)   . Coronary artery dilation     . Myocardial infarction 2013    x 3  . 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 Abdominal hysterectomy   . Coronary artery bypass graft 2012    7 stents  . Cea     Left  . Carpal tunnel release   . Av fistula placement   . Grave's disease     ? unclea rif ca or not      Medications:  HOME MEDS: Prior to Admission medications   Medication Sig Start Date End Date Taking? Authorizing Provider  amLODipine (NORVASC) 10 MG tablet Take 10 mg by mouth daily.    Historical Provider, MD  atorvastatin (LIPITOR) 40 MG tablet Take 40 mg by mouth daily.    Historical Provider, MD  clopidogrel (PLAVIX) 75 MG tablet Take 75 mg by mouth daily.    Historical Provider, MD  fluticasone (FLOVENT HFA) 110 MCG/ACT inhaler Inhale 1 puff into the lungs 2 (two) times daily. 06/09/11 06/08/12  Rhetta Mura, MD  gabapentin (NEURONTIN) 100 MG capsule Take 1 capsule (100 mg total) by mouth 3 (three) times daily. 06/09/11 06/08/12  Rhetta Mura, MD  hydrALAZINE (APRESOLINE) 100 MG tablet Take 100 mg by mouth 3 (three) times daily.    Historical Provider, MD  insulin aspart (NOVOLOG) 100 UNIT/ML injection Inject 2 Units into the skin 3 (three) times daily with meals. 06/09/11 06/08/12  Rhetta Mura, MD  insulin glargine (LANTUS  SOLOSTAR) 100 UNIT/ML injection Inject 30 Units into the skin at bedtime. 06/09/11   Rhetta Mura, MD  isosorbide mononitrate (IMDUR) 120 MG 24 hr tablet Take 120 mg by mouth daily.    Historical Provider, MD  methimazole (TAPAZOLE) 10 MG tablet Take 1 tablet (10 mg total) by mouth 3 (three) times daily. 06/09/11 06/08/12  Rhetta Mura, MD  metoprolol succinate (TOPROL-XL) 100 MG 24 hr tablet Take 1 tablet (100 mg total) by mouth daily. Take with or immediately following a meal. 06/11/11 06/10/12  Rhetta Mura, MD  Oxycodone HCl 10 MG TABS Take 1 tablet (10 mg total) by mouth every 8 (eight) hours. 06/09/11   Rhetta Mura, MD  pantoprazole (PROTONIX) 40 MG tablet Take 40 mg by mouth daily.    Historical Provider, MD  potassium chloride (K-DUR) 10 MEQ tablet Take 2 tablets (20 mEq total) by mouth daily. 06/09/11 06/08/12  Rhetta Mura, MD  promethazine (PHENERGAN) 12.5 MG tablet Take 1 tablet (12.5 mg total) by mouth every 6 (six) hours as needed for nausea. 06/09/11 06/16/11  Rhetta Mura, MD  simvastatin (ZOCOR) 20 MG tablet Take 1 tablet (20 mg total) by mouth daily at 6 PM. 06/09/11 06/08/12  Rhetta Mura, MD  sorbitol 70 % SOLN Take 30 mLs by mouth 3 (three) times daily. 06/09/11   Rhetta Mura, MD  Vitamin D, Ergocalciferol, (DRISDOL) 50000 UNITS CAPS Take 50,000 Units by mouth every 30 (thirty) days.    Historical Provider, MD     Allergies  Allergen Reactions  . Ciprocin-Fluocin-Procin (Fluocinolone Acetonide) Itching  . Dilaudid (Hydromorphone Hcl) Itching  . Morphine Sulfate Itching  . Opium Itching     Social History:  reports that she quit smoking about 15 years ago. Her smoking use included Cigarettes. She has never used smokeless tobacco. She reports that she does not drink alcohol or use illicit drugs.    Family History  Problem Relation Age of Onset  . Diabetes type II       HTN in Maternal Grandmother    Physical Exam:  GEN:  Pleasant  71 year old Obese African American Female examined  and in no acute distress; cooperative with exam Filed Vitals:   10/29/11 2204  BP: 214/91  Pulse: 84  Temp: 98.2 F (36.8 C)  TempSrc: Oral  Resp: 18  Height: 5\' 6"  (1.676 m)  Weight: 76.5 kg (168 lb 10.4 oz)  SpO2: 96%   Blood pressure 214/91, pulse 84, temperature 98.2 F (36.8 C), temperature source Oral, resp. rate 18, height 5\' 6"  (1.676 m), weight 76.5 kg (168 lb 10.4 oz), SpO2 96.00%. PSYCH: She is alert and oriented x4; does not appear anxious does not appear depressed; affect is normal HEENT: Normocephalic and Atraumatic  +Left sided Facial Droop with Lid lag  left eyelid, Mucous membranes pink; PERRLA; EOM intact; Fundi:  Benign;  No scleral icterus, Nares: Patent, Oropharynx: Clear, Fair Dentition, Neck:  FROM, no cervical lymphadenopathy nor thyromegaly or carotid bruit; no JVD; Breasts:: Not examined CHEST WALL: No tenderness CHEST: Normal respiration, clear to auscultation bilaterally HEART: Regular rate and rhythm; no murmurs rubs or gallops BACK: No kyphosis or scoliosis; no CVA tenderness ABDOMEN: Positive Bowel Sounds, Obese, soft non-tender; no masses, no organomegaly, no pannus; no intertriginous candida. Rectal Exam: Not done EXTREMITIES: No bone or joint deformity; age-appropriate arthropathy of the hands and knees; no cyanosis, clubbing or edema; no ulcerations. Genitalia: not examined PULSES: 2+ and symmetric SKIN: Normal hydration no rash or ulceration CNS:  Cranial nerves 2-12 grossly intact no focal neurologic deficit    Labs on Admission:  Basic Metabolic Panel: No results found for this basename: NA:5,K:5,CL:5,CO2:5,GLUCOSE:5,BUN:5,CREATININE:5,CALCIUM:5,MG:5,PHOS:5 in the last 168 hours Liver Function Tests: No results found for this basename: AST:5,ALT:5,ALKPHOS:5,BILITOT:5,PROT:5,ALBUMIN:5 in the last 168 hours No results found for this basename: LIPASE:5,AMYLASE:5 in the last 168 hours No results found for this basename: AMMONIA:5 in the last 168 hours CBC: No results found for this basename: WBC:5,NEUTROABS:5,HGB:5,HCT:5,MCV:5,PLT:5 in the last 168 hours Cardiac Enzymes: No results found for this basename: CKTOTAL:5,CKMB:5,CKMBINDEX:5,TROPONINI:5 in the last 168 hours  BNP (last 3 results)  Basename 06/05/11 0157  PROBNP 34667.0*   CBG:  Lab 10/29/11 2156  GLUCAP 87    Radiological Exams on Admission: No results found.  EKG: Independently reviewed.   Assessment: Principal Problem:  *Chest pain Active Problems:  Hypertensive emergency  Chronic kidney disease (CKD), stage V  CAD (coronary artery  disease) of bypass graft  Diabetes mellitus  COPD (chronic obstructive pulmonary disease)  Bell's palsy  Hyperthyroidism    Plan:     Admit to Telemetry Bed Observation Status Cardiac Enzymes and Cardiac Monitoring Nitropaste, ASA, and O2 therapy IV Hydralazine for Elevated Blood Pressure Notify Dialysis Team Check TSH,and Labs DVT prophylaxis    Code Status: FULL CODE Family Communication: N/A Disposition Plan: Return to Home  Time spent: 37 Minutes  Ron Parker [facility] Pager [phone]  If 7PM-7AM, please contact night-coverage [email or web address] Password [facility] 10/29/2011, 11:04 PM

## 2011-10-29 NOTE — Progress Notes (Signed)
Holly Ellison 71 year old lady with h/o Hypertension, diabetes and ESRD on HD is being transferred from [facility] to [facility] for chest pain started this afternoon associated with hypertensive urgency. Chest pain resolved with control of blood pressure. Currently she is chest pain free , but she is being tranferred to [facility] for evaluation of chest pain. She will admitted to telemetry.    Kathlen Mody, MD [facility] [phone]

## 2011-10-30 ENCOUNTER — Observation Stay (HOSPITAL_COMMUNITY): Payer: Medicare Other

## 2011-10-30 ENCOUNTER — Encounter (HOSPITAL_COMMUNITY): Payer: Self-pay | Admitting: *Deleted

## 2011-10-30 DIAGNOSIS — I25810 Atherosclerosis of coronary artery bypass graft(s) without angina pectoris: Secondary | ICD-10-CM

## 2011-10-30 DIAGNOSIS — N185 Chronic kidney disease, stage 5: Secondary | ICD-10-CM

## 2011-10-30 DIAGNOSIS — G510 Bell's palsy: Secondary | ICD-10-CM

## 2011-10-30 LAB — COMPREHENSIVE METABOLIC PANEL
ALT: 38 U/L — ABNORMAL HIGH (ref 0–35)
AST: 26 U/L (ref 0–37)
Albumin: 3.7 g/dL (ref 3.5–5.2)
Alkaline Phosphatase: 84 U/L (ref 39–117)
Calcium: 9.3 mg/dL (ref 8.4–10.5)
Potassium: 3.2 mEq/L — ABNORMAL LOW (ref 3.5–5.1)
Sodium: 138 mEq/L (ref 135–145)
Total Protein: 6.8 g/dL (ref 6.0–8.3)

## 2011-10-30 LAB — BASIC METABOLIC PANEL
CO2: 28 mEq/L (ref 19–32)
Calcium: 9.7 mg/dL (ref 8.4–10.5)
Chloride: 99 mEq/L (ref 96–112)
Creatinine, Ser: 2.84 mg/dL — ABNORMAL HIGH (ref 0.50–1.10)
GFR calc Af Amer: 19 mL/min — ABNORMAL LOW (ref >90)
GFR calc Af Amer: 18 mL/min — ABNORMAL LOW (ref >90)
GFR calc non Af Amer: 16 mL/min — ABNORMAL LOW (ref >90)
Potassium: 3.5 mEq/L (ref 3.5–5.1)

## 2011-10-30 LAB — CK TOTAL AND CKMB (NOT AT ARMC)
CK, MB: 1.9 ng/mL (ref 0.3–4.0)
CK, MB: 2.1 ng/mL (ref 0.3–4.0)
Relative Index: INVALID (ref 0.0–2.5)
Total CK: 53 U/L (ref 7–177)
Total CK: 51 U/L (ref 7–177)

## 2011-10-30 LAB — CBC
HCT: 37.6 % (ref 36.0–46.0)
Hemoglobin: 12.5 g/dL (ref 12.0–15.0)
RDW: 13.3 % (ref 11.5–15.5)
WBC: 7.9 10*3/uL (ref 4.0–10.5)

## 2011-10-30 LAB — GLUCOSE, CAPILLARY
Glucose-Capillary: 76 mg/dL (ref 70–99)
Glucose-Capillary: 91 mg/dL (ref 70–99)

## 2011-10-30 LAB — CBC WITH DIFFERENTIAL/PLATELET
Basophils Absolute: 0 10*3/uL (ref 0.0–0.1)
Basophils Relative: 1 % (ref 0–1)
Eosinophils Absolute: 0 10*3/uL (ref 0.0–0.7)
Lymphs Abs: 2.1 10*3/uL (ref 0.7–4.0)
MCH: 32.7 pg (ref 26.0–34.0)
MCHC: 34.2 g/dL (ref 30.0–36.0)
Neutrophils Relative %: 65 % (ref 43–77)
Platelets: 299 10*3/uL (ref 150–400)
RBC: 3.67 MIL/uL — ABNORMAL LOW (ref 3.87–5.11)
RDW: 12.9 % (ref 11.5–15.5)

## 2011-10-30 LAB — MAGNESIUM: Magnesium: 2.1 mg/dL (ref 1.5–2.5)

## 2011-10-30 LAB — TROPONIN I
Troponin I: <0.3 ng/mL (ref <0.30)
Troponin I: <0.3 ng/mL (ref <0.30)

## 2011-10-30 LAB — HEMOGLOBIN A1C: Mean Plasma Glucose: 114 mg/dL (ref <117)

## 2011-10-30 MED ORDER — AMLODIPINE BESYLATE 10 MG PO TABS
10.0000 mg | ORAL_TABLET | Freq: Every day | ORAL | Status: DC
  Administered 2011-10-30 – 2011-11-02 (×4): 10 mg via ORAL
  Filled 2011-10-30 (×4): qty 1

## 2011-10-30 MED ORDER — VITAMIN D (ERGOCALCIFEROL) 1.25 MG (50000 UNIT) PO CAPS: 50000.0000 [IU] | ORAL_CAPSULE | ORAL | Status: DC

## 2011-10-30 MED ORDER — SORBITOL 70 % SOLN: 30.0000 mL | Freq: Three times a day (TID) | Status: DC

## 2011-10-30 MED ORDER — PANTOPRAZOLE SODIUM 40 MG PO TBEC
40.0000 mg | DELAYED_RELEASE_TABLET | Freq: Every day | ORAL | Status: DC
  Administered 2011-10-30 – 2011-11-02 (×4): 40 mg via ORAL
  Filled 2011-10-30 (×3): qty 1

## 2011-10-30 MED ORDER — FLUTICASONE PROPIONATE HFA 110 MCG/ACT IN AERO
1.0000 | INHALATION_SPRAY | Freq: Two times a day (BID) | RESPIRATORY_TRACT | Status: DC
  Administered 2011-10-31 – 2011-11-02 (×4): 1 via RESPIRATORY_TRACT
  Filled 2011-10-30: qty 12

## 2011-10-30 MED ORDER — POTASSIUM CHLORIDE ER 10 MEQ PO TBCR
20.0000 meq | EXTENDED_RELEASE_TABLET | Freq: Every day | ORAL | Status: DC
  Filled 2011-10-30: qty 2

## 2011-10-30 MED ORDER — GABAPENTIN 100 MG PO CAPS
100.0000 mg | ORAL_CAPSULE | Freq: Three times a day (TID) | ORAL | Status: DC
  Administered 2011-10-30 – 2011-11-02 (×11): 100 mg via ORAL
  Filled 2011-10-30 (×12): qty 1

## 2011-10-30 MED ORDER — CLOPIDOGREL BISULFATE 75 MG PO TABS
75.0000 mg | ORAL_TABLET | Freq: Every day | ORAL | Status: DC
  Administered 2011-10-30 – 2011-11-02 (×4): 75 mg via ORAL
  Filled 2011-10-30 (×5): qty 1

## 2011-10-30 MED ORDER — INFLUENZA VIRUS VACC SPLIT PF IM SUSP
0.5000 mL | INTRAMUSCULAR | Status: AC
  Administered 2011-10-30: 0.5 mL via INTRAMUSCULAR
  Filled 2011-10-30: qty 0.5

## 2011-10-30 MED ORDER — ATORVASTATIN CALCIUM 40 MG PO TABS
40.0000 mg | ORAL_TABLET | Freq: Every day | ORAL | Status: DC
  Administered 2011-10-30 – 2011-11-02 (×4): 40 mg via ORAL
  Filled 2011-10-30 (×4): qty 1

## 2011-10-30 MED ORDER — INSULIN ASPART 100 UNIT/ML ~~LOC~~ SOLN: 2.0000 [IU] | Freq: Three times a day (TID) | SUBCUTANEOUS | Status: DC

## 2011-10-30 MED ORDER — METOPROLOL SUCCINATE ER 100 MG PO TB24
100.0000 mg | ORAL_TABLET | Freq: Every day | ORAL | Status: DC
  Filled 2011-10-30: qty 1

## 2011-10-30 MED ORDER — METHIMAZOLE 10 MG PO TABS
10.0000 mg | ORAL_TABLET | Freq: Three times a day (TID) | ORAL | Status: DC
  Administered 2011-10-30 – 2011-11-02 (×11): 10 mg via ORAL
  Filled 2011-10-30 (×12): qty 1

## 2011-10-30 MED ORDER — HYDRALAZINE HCL 50 MG PO TABS
100.0000 mg | ORAL_TABLET | Freq: Three times a day (TID) | ORAL | Status: DC
  Filled 2011-10-30 (×4): qty 2

## 2011-10-30 MED ORDER — OXYCODONE HCL 5 MG PO TABS
5.0000 mg | ORAL_TABLET | Freq: Three times a day (TID) | ORAL | Status: DC | PRN
  Administered 2011-10-30 – 2011-10-31 (×3): 10 mg via ORAL
  Filled 2011-10-30 (×4): qty 2

## 2011-10-30 MED ORDER — INSULIN GLARGINE 100 UNIT/ML ~~LOC~~ SOLN: 30.0000 [IU] | Freq: Every day | SUBCUTANEOUS | Status: DC

## 2011-10-30 MED ORDER — POTASSIUM CHLORIDE CRYS ER 20 MEQ PO TBCR
40.0000 meq | EXTENDED_RELEASE_TABLET | Freq: Once | ORAL | Status: AC
  Administered 2011-10-30: 40 meq via ORAL
  Filled 2011-10-30: qty 2

## 2011-10-30 MED ORDER — SIMVASTATIN 20 MG PO TABS: 20.0000 mg | ORAL_TABLET | Freq: Every day | ORAL | Status: DC

## 2011-10-30 MED ORDER — INSULIN GLARGINE 100 UNIT/ML ~~LOC~~ SOLN
28.0000 [IU] | Freq: Every day | SUBCUTANEOUS | Status: DC
  Administered 2011-10-31 – 2011-11-01 (×3): 28 [IU] via SUBCUTANEOUS

## 2011-10-30 NOTE — Progress Notes (Signed)
Patient ID: Holly Ellison, female   DOB: January 03, 1941, 71 y.o.   MRN: [id number]  [facility] PROGRESS NOTE  Holly Ellison [id number] DOB: September 16, 1940 DOA: 10/29/2011 PCP: Lucianne Lei, MD  Brief narrative: Pt i s71 yo female with ESRD on HD Tu-Th-Sat in [facility] presented with mod area chest pain while on HD and was transferred to [facility] for further evaluation.  Principal Problem:  *Chest pain - possible musculoskeletal etiology vs anginal event - 2 sets of cardiac enzymes are negative and no acute on telemetry noted - will continue to observe on telemetry floor - likely d/c in AM  Active Problems:  Chronic kidney disease (CKD), stage V - nephrology team notified - pt has HD TTS - will likely be OK to proceed on Tuesday per regular schedule   CAD (coronary artery disease) of bypass graft - clinically stable at this point   Diabetes mellitus - will check A1C - monitor CBG per floor protocol   COPD (chronic obstructive pulmonary disease) - currenlty stable - will continue to monitor vitals per floor protocol   Hypertensive emergency - continue current blood pressure regimen - will monitor BP and will readjust the regimen as indicated   Bell's palsy   Hyperthyroidism - continue synthroid  Consultants:  None  Procedures/Studies: Dg Chest Port 1 View  10/30/2011  *RADIOLOGY REPORT*  Clinical Data: Chest pain  PORTABLE CHEST - 1 VIEW  Comparison: 10/29/2011  Findings: Cardiomediastinal silhouette stable cardiomegaly again noted.  Status post CABG.  No acute infiltrate or pulmonary edema. Mild left basilar atelectasis.  IMPRESSION: Cardiomegaly.  No acute infiltrate or pulmonary edema.  Left basilar atelectasis.   Original Report Authenticated By: Natasha Mead, M.D.     Antibiotics:  None  Code Status: Full Family Communication: Pt at bedside Disposition Plan: Home when medically stable  HPI/Subjective: No events overnight.   Objective: Filed Vitals:     10/30/11 0142 10/30/11 0523 10/30/11 0859 10/30/11 1410  BP: 161/65 168/79 159/68 191/84  Pulse: 89 86 81 88  Temp:  98.6 F (37 C) 98.7 F (37.1 C) 98.6 F (37 C)  TempSrc:  Oral Oral Oral  Resp: 18 18 18 18   Height:      Weight:      SpO2: 96% 94% 91% 93%    Intake/Output Summary (Last 24 hours) at 10/30/11 1555 Last data filed at 10/30/11 0830  Gross per 24 hour  Intake    240 ml  Output      0 ml  Net    240 ml    Exam:   General:  Pt is alert, follows commands appropriately, not in acute distress  Cardiovascular: Regular rate and rhythm, S1/S2, no murmurs, no rubs, no gallops  Respiratory: Clear to auscultation bilaterally, no wheezing, no crackles, no rhonchi  Abdomen: Soft, non tender, non distended, bowel sounds present, no guarding  Extremities: No edema, pulses DP and PT palpable bilaterally  Neuro: Grossly nonfocal  Data Reviewed: Basic Metabolic Panel:  Lab 10/30/11 9562 10/30/11 0026  NA 138 138  K 3.5 3.2*  CL 99 99  CO2 27 29  GLUCOSE 80 89  BUN 21 20  CREATININE 2.71* 2.57*  CALCIUM 9.3 9.3  MG -- 2.1  PHOS -- --   Liver Function Tests:  Lab 10/30/11 0026  AST 26  ALT 38*  ALKPHOS 84  BILITOT 0.7  PROT 6.8  ALBUMIN 3.7   CBC:  Lab 10/30/11 0639 10/30/11 0026  WBC 7.9 8.0  NEUTROABS --  5.2  HGB 12.5 12.0  HCT 37.6 35.1*  MCV 96.7 95.6  PLT 292 299   Cardiac Enzymes:  Lab 10/30/11 1030 10/30/11 0639 10/30/11 0026  CKTOTAL 51 54 53  CKMB 2.1 1.9 1.8  CKMBINDEX -- -- --  TROPONINI <0.30 <0.30 <0.30   CBG:  Lab 10/30/11 1137 10/30/11 0750 10/29/11 2156  GLUCAP 95 76 87    Scheduled Meds:   . amLODipine  10 mg Oral Daily  . aspirin EC  325 mg Oral Daily  . atorvastatin  40 mg Oral Daily  . clopidogrel  75 mg Oral Q breakfast  . enoxaparin injection  30 mg Subcutaneous Daily  . fluticasone  1 puff Inhalation BID  . gabapentin  100 mg Oral TID  . insulin aspart  0-5 Units Subcutaneous QHS  . insulin aspart  0-9  Units Subcutaneous TID WC  . insulin glargine  28 Units Subcutaneous QHS  . methimazole  10 mg Oral TID  . nitroGLYCERIN  1 inch Topical Q6H  . pantoprazole  40 mg Oral Daily  . potassium chloride  40 mEq Oral Once   Continuous Infusions:    Debbora Presto, MD  [facility] Pager [phone]  If 7PM-7AM, please contact night-coverage [email or web address] Password [facility] 10/30/2011, 3:55 PM   LOS: 1 day

## 2011-10-30 NOTE — Progress Notes (Signed)
Nephrology is aware of her admission for observation for chest pain.  Chest xray at [facility] clear. K 3.5.  No indication for consult yet as it looks as though she may be discharged.  Please notify us Monday if you anticipate her being here to need dialysis on Tuesday.  Dr. Arlean Hopping will be the attending on Monday.  Bard Herbert, PA-C

## 2011-10-31 LAB — GLUCOSE, CAPILLARY
Glucose-Capillary: 103 mg/dL — ABNORMAL HIGH (ref 70–99)
Glucose-Capillary: 113 mg/dL — ABNORMAL HIGH (ref 70–99)
Glucose-Capillary: 119 mg/dL — ABNORMAL HIGH (ref 70–99)

## 2011-10-31 LAB — CBC
MCH: 32.1 pg (ref 26.0–34.0)
MCHC: 33.2 g/dL (ref 30.0–36.0)
MCV: 96.8 fL (ref 78.0–100.0)
Platelets: 281 10*3/uL (ref 150–400)
RDW: 13.2 % (ref 11.5–15.5)

## 2011-10-31 MED ORDER — HYDRALAZINE HCL 25 MG PO TABS
25.0000 mg | ORAL_TABLET | Freq: Three times a day (TID) | ORAL | Status: DC
  Administered 2011-10-31 – 2011-11-02 (×6): 25 mg via ORAL
  Filled 2011-10-31 (×9): qty 1

## 2011-10-31 MED ORDER — ONDANSETRON HCL 4 MG/2ML IJ SOLN
4.0000 mg | INTRAMUSCULAR | Status: DC | PRN
  Filled 2011-10-31: qty 2

## 2011-10-31 MED ORDER — BIOTENE DRY MOUTH MT LIQD
15.0000 mL | Freq: Two times a day (BID) | OROMUCOSAL | Status: DC
  Administered 2011-10-31 – 2011-11-02 (×4): 15 mL via OROMUCOSAL

## 2011-10-31 MED ORDER — HEPARIN SODIUM (PORCINE) 1000 UNIT/ML DIALYSIS: 2100.0000 [IU] | INTRAMUSCULAR | Status: DC | PRN

## 2011-10-31 MED ORDER — ONDANSETRON HCL 4 MG PO TABS
4.0000 mg | ORAL_TABLET | ORAL | Status: DC | PRN
  Administered 2011-11-01: 4 mg via ORAL
  Filled 2011-10-31: qty 1

## 2011-10-31 NOTE — Progress Notes (Signed)
Utilization review complete 

## 2011-10-31 NOTE — Progress Notes (Addendum)
Patient ID: Holly Ellison, female   DOB: [birth year], 71 y.o.   MRN: [id number]  [facility] PROGRESS NOTE  Holly Ellison [id number] DOB: 24-Apr-1940 DOA: 10/29/2011 PCP: Lucianne Lei, MD  Brief narrative:  Pt i s71 yo female with ESRD on HD Tu-Th-Sat in [facility] presented with mod area chest pain while on HD and was transferred to [facility] for further evaluation.   Principal Problem:  *Chest pain  - possible musculoskeletal etiology vs anginal event  - 3 sets of cardiac enzymes are negative and no acute events on telemetry noted  - likely d/c in AM    Nausea - unclear etiology - witnessed vomiting by nurse this afternoon - will change diet to clear liquid and will give her Zofran for now PRN  Active Problems:  Chronic kidney disease (CKD), stage V  - nephrology team notified  - pt has HD TTS   CAD (coronary artery disease) of bypass graft  - clinically stable at this point   Diabetes mellitus  - will check A1C  - monitor CBG per floor protocol   COPD (chronic obstructive pulmonary disease)  - currenlty stable  - will continue to monitor vitals per floor protocol   Hypertensive emergency  - will add Hydralazine - BP is actually better this AM  Bell's palsy   Hyperthyroidism  - continue synthroid   Consultants:  None  Procedures/Studies:  Dg Chest Port 1 View 10/30/2011 *RADIOLOGY REPORT* Clinical Data: Chest pain PORTABLE CHEST - 1 VIEW Comparison: 10/29/2011 Findings: Cardiomediastinal silhouette stable cardiomegaly again noted. Status post CABG. No acute infiltrate or pulmonary edema. Mild left basilar atelectasis. IMPRESSION: Cardiomegaly. No acute infiltrate or pulmonary edema. Left basilar atelectasis. Original Report Authenticated By: Natasha Mead, M.D.   Antibiotics:  None  Code Status: Full  Family Communication: Pt at bedside  Disposition Plan: Home when medically stable   HPI/Subjective: No events overnight. Pt reports nausea and non bloody  vomiting this AM.  Objective: Filed Vitals:   10/31/11 0207 10/31/11 0557 10/31/11 1016 10/31/11 1351  BP: 158/71 181/86 144/69 193/76  Pulse:  96 86 88  Temp:  97.3 F (36.3 C) 98.3 F (36.8 C) 98.1 F (36.7 C)  TempSrc:  Oral Oral Oral  Resp:  20 18 18   Height:      Weight:      SpO2:  98% 92% 93%    Intake/Output Summary (Last 24 hours) at 10/31/11 1523 Last data filed at 10/31/11 1300  Gross per 24 hour  Intake    530 ml  Output      0 ml  Net    530 ml    Exam:   General:  Pt is alert, follows commands appropriately, not in acute distress  Cardiovascular: Regular rate and rhythm, S1/S2, no murmurs, no rubs, no gallops  Respiratory: Clear to auscultation bilaterally, no wheezing, no crackles, no rhonchi  Abdomen: Soft, non tender, non distended, bowel sounds present, no guarding  Extremities: No edema, pulses DP and PT palpable bilaterally  Data Reviewed: Basic Metabolic Panel:  Lab 10/30/11 0347 10/30/11 0639 10/30/11 0026  NA 137 138 138  K 4.1 3.5 3.2*  CL 97 99 99  CO2 28 27 29   GLUCOSE 122* 80 89  BUN 22 21 20   CREATININE 2.84* 2.71* 2.57*  CALCIUM 9.7 9.3 9.3  MG -- -- 2.1  PHOS -- -- --   Liver Function Tests:  Lab 10/30/11 0026  AST 26  ALT 38*  ALKPHOS 84  BILITOT  0.7  PROT 6.8  ALBUMIN 3.7    CBC:  Lab 10/31/11 0615 10/30/11 0639 10/30/11 0026  WBC 7.8 7.9 8.0  NEUTROABS -- -- 5.2  HGB 12.9 12.5 12.0  HCT 38.9 37.6 35.1*  MCV 96.8 96.7 95.6  PLT 281 292 299   Cardiac Enzymes:  Lab 10/30/11 1030 10/30/11 0639 10/30/11 0026  CKTOTAL 51 54 53  CKMB 2.1 1.9 1.8  CKMBINDEX -- -- --  TROPONINI <0.30 <0.30 <0.30   BNP: No components found with this basename: POCBNP:5 CBG:  Lab 10/31/11 1131 10/31/11 0744 10/30/11 2201 10/30/11 1739 10/30/11 1137  GLUCAP 113* 103* 108* 91 95    No results found for this or any previous visit (from the past 240 hour(s)).   Scheduled Meds:   . amLODipine  10 mg Oral Daily  .  antiseptic oral rinse  15 mL Mouth Rinse BID  . aspirin EC  325 mg Oral Daily  . atorvastatin  40 mg Oral Daily  . clopidogrel  75 mg Oral Q breakfast  . enoxaparin (LOVENOX) injection  30 mg Subcutaneous Daily  . fluticasone  1 puff Inhalation BID  . gabapentin  100 mg Oral TID  . hydrALAZINE  25 mg Oral Q8H  . insulin aspart  0-5 Units Subcutaneous QHS  . insulin aspart  0-9 Units Subcutaneous TID WC  . insulin glargine  28 Units Subcutaneous QHS  . methimazole  10 mg Oral TID  . nitroGLYCERIN  1 inch Topical Q6H  . pantoprazole  40 mg Oral Daily  . sodium chloride  3 mL Intravenous Q12H   Continuous Infusions:    Debbora Presto, MD  [facility] Pager [phone]  If 7PM-7AM, please contact night-coverage [email or web address] Password [email or web address] 10/31/2011, 3:23 PM   LOS: 2 days

## 2011-10-31 NOTE — Progress Notes (Signed)
Physical Therapy Evaluation Patient Details Name: Holly Ellison MRN: [id number] DOB: March 12, 1940 Today's Date: 10/31/2011 Time: 4696-2952 PT Time Calculation (min): 24 min  PT Assessment / Plan / Recommendation Clinical Impression  71 yo female admitted from HD yesterday with chest pain; Presents with decr activity tol, but overall moving well with RW; Will benefit form PT to progress activity tol, and enable pt to safely dc home (likley 1 mroe session)    PT Assessment  Patient needs continued PT services    Follow Up Recommendations  Home health PT;Supervision - Intermittent    Barriers to Discharge        Equipment Recommendations  3 in 1 bedside comode (pt may already have)    Recommendations for Other Services OT consult (Decr vision/blurriness)   Frequency Min 3X/week    Precautions / Restrictions Precautions Precautions: Fall Precaution Comments: slight fall risk, which is much better when pt uses RW; left a rW in room   Pertinent Vitals/Pain No pain; just queasiness in stomach HR 102 post amb; no reports of chest pain      Mobility  Bed Mobility Bed Mobility: Supine to Sit;Sitting - Scoot to Edge of Bed Supine to Sit: 6: Modified independent (Device/Increase time) Sitting - Scoot to Edge of Bed: 6: Modified independent (Device/Increase time) Transfers Transfers: Sit to Stand;Stand to Sit Sit to Stand: 5: Supervision;With upper extremity assist;From bed Stand to Sit: 5: Supervision;With upper extremity assist;To chair/3-in-1;To toilet Details for Transfer Assistance: cues for safety and hand placement; tends to pull up on RW Ambulation/Gait Ambulation/Gait Assistance: 4: Min guard Ambulation Distance (Feet): 140 Feet Assistive device: Rolling walker Ambulation/Gait Assistance Details: Cues for posture and to self-monitor for activity tol; No CP with amb Gait Pattern: Step-through pattern    Exercises     PT Diagnosis: Difficulty walking  PT Problem List:  Decreased activity tolerance;Decreased balance;Decreased knowledge of use of DME PT Treatment Interventions: DME instruction;Gait training;Functional mobility training;Therapeutic activities;Therapeutic exercise;Balance training;Patient/family education   PT Goals Acute Rehab PT Goals PT Goal Formulation: With patient Time For Goal Achievement: 11/07/11 Potential to Achieve Goals: Good Pt will go Sit to Stand: with modified independence PT Goal: Sit to Stand - Progress: Goal set today Pt will go Stand to Sit: with modified independence PT Goal: Stand to Sit - Progress: Goal set today Pt will Ambulate: >150 feet;with modified independence;with least restrictive assistive device PT Goal: Ambulate - Progress: Goal set today  Visit Information  Last PT Received On: 10/31/11 Assistance Needed: +1    Subjective Data  Subjective: Agreeable to amb Patient Stated Goal: Feel better   Prior Functioning  Home Living Lives With: Family Available Help at Discharge: Available PRN/intermittently;Family Type of Home: House Home Access: Level entry Home Layout: One level Bathroom Shower/Tub: Walk-in shower Home Adaptive Equipment: Walker - rolling;Shower chair with back Prior Function Level of Independence: Needs assistance Needs Assistance: Light Housekeeping;Meal Prep Meal Prep: Maximal Light Housekeeping: Maximal Driving: No Comments: Uses RW for amb Communication Communication: No difficulties    Cognition  Overall Cognitive Status: Appears within functional limits for tasks assessed/performed Arousal/Alertness: Awake/alert Orientation Level: Appears intact for tasks assessed Behavior During Session: [facility] for tasks performed    Extremity/Trunk Assessment Right Upper Extremity Assessment RUE ROM/Strength/Tone: [facility] for tasks assessed Left Upper Extremity Assessment LUE ROM/Strength/Tone: WFL for tasks assessed Right Lower Extremity Assessment RLE ROM/Strength/Tone: [facility] for tasks  assessed Left Lower Extremity Assessment LLE ROM/Strength/Tone: [facility] for tasks assessed Trunk Assessment Trunk Assessment: Normal   Balance  End of Session PT - End of Session Equipment Utilized During Treatment: Gait belt Activity Tolerance: Patient tolerated treatment well Patient left: with call bell/phone within reach (on commode in bathroom; RN notified) Nurse Communication: Mobility status (that pt is in bathroom; that pt reports "shakiness")  GP Functional Assessment Tool Used: Clinical judgement Functional Limitation: Mobility: Walking and moving around Mobility: Walking and Moving Around Current Status ([id number]): At least 1 percent but less than 20 percent impaired, limited or restricted Mobility: Walking and Moving Around Goal Status [phone]): 0 percent impaired, limited or restricted   Van Clines [facility] [place], [place] [phone]  10/31/2011, 4:31 PM

## 2011-11-01 ENCOUNTER — Observation Stay (HOSPITAL_COMMUNITY): Payer: Medicare Other

## 2011-11-01 LAB — CBC
HCT: 35.9 % — ABNORMAL LOW (ref 36.0–46.0)
Hemoglobin: 11.9 g/dL — ABNORMAL LOW (ref 12.0–15.0)
MCH: 32.1 pg (ref 26.0–34.0)
MCV: 96.8 fL (ref 78.0–100.0)
RBC: 3.71 MIL/uL — ABNORMAL LOW (ref 3.87–5.11)

## 2011-11-01 LAB — RENAL FUNCTION PANEL
BUN: 25 mg/dL — ABNORMAL HIGH (ref 6–23)
CO2: 24 mEq/L (ref 19–32)
Chloride: 101 mEq/L (ref 96–112)
Creatinine, Ser: 3.39 mg/dL — ABNORMAL HIGH (ref 0.50–1.10)
Potassium: 3.8 mEq/L (ref 3.5–5.1)

## 2011-11-01 LAB — GLUCOSE, CAPILLARY
Glucose-Capillary: 224 mg/dL — ABNORMAL HIGH (ref 70–99)
Glucose-Capillary: 103 mg/dL — ABNORMAL HIGH (ref 70–99)

## 2011-11-01 MED ORDER — SODIUM CHLORIDE 0.9 % IV BOLUS (SEPSIS)
1000.0000 mL | Freq: Once | INTRAVENOUS | Status: AC
  Administered 2011-11-01: 1000 mL via INTRAVENOUS

## 2011-11-01 MED ORDER — SODIUM CHLORIDE 0.9 % IV SOLN
125.0000 mg | INTRAVENOUS | Status: DC
  Administered 2011-11-02: 125 mg via INTRAVENOUS
  Filled 2011-11-01 (×2): qty 10

## 2011-11-01 MED ORDER — PARICALCITOL 5 MCG/ML IV SOLN
2.0000 ug | INTRAVENOUS | Status: DC
  Filled 2011-11-01: qty 0.4

## 2011-11-01 MED ORDER — SODIUM CHLORIDE 0.9 % IV SOLN
250.0000 mL | INTRAVENOUS | Status: DC | PRN
  Administered 2011-11-01: 250 mL via INTRAVENOUS

## 2011-11-01 NOTE — Progress Notes (Signed)
May have overdialyzed, she is now 5 kg under dry weight and orthostatic. Vol depletion could be contributing to nausea as well. Will give 1 liter NS bolus then NS at 75 cc/hr.   Vinson Moselle  MD [employer] [phone] pgr    [phone] cell 11/01/2011, 2:07 PM

## 2011-11-01 NOTE — Discharge Summary (Addendum)
Physician Discharge Summary  Holly Ellison ZOX:[id number] DOB: [birth year] DOA: 10/29/2011  PCP: Lucianne Lei, MD  Admit date: 10/29/2011 Discharge date: 11/01/2011  Recommendations for Outpatient Follow-up:  1. Pt will need to follow up with PCP in 2-3 weeks post discharge 2. Pt has HD Tu-Th-Sat in [place] 3. Please also check CBC to evaluate Hg and Hct levels 4. Please note that ACS was ruled out 5. Pt had difficulty with BP control while in hospital so please check BP regularly and readjust the medications as indicated  Discharge Diagnoses: Chest pain likely secondary to musculoskeletal pain Principal Problem:  *Chest pain Active Problems:  Chronic kidney disease (CKD), stage V  CAD (coronary artery disease) of bypass graft  Diabetes mellitus  COPD (chronic obstructive pulmonary disease)  Hypertensive emergency  Bell's palsy  Hyperthyroidism  Discharge Condition: Stable  Diet recommendation: Renal diet discussed in details   History of present illness:  Brief narrative:  Pt i s49 yo female with ESRD on HD Tu-Th-Sat in [facility] presented with mod area chest pain while on HD and was transferred to [facility] for further evaluation.   Principal Problem:  *Chest pain  - possible musculoskeletal etiology vs anginal event in the setting of malignant hypertension - resolved with hypertension control and with HD - 3 sets of cardiac enzymes are negative and no acute events on telemetry noted  - ACS ruled out - risk factor control discussed in detail  Nausea  - unclear etiology  - we have provided supportive care and it sappers that today pt is tolerating diet well - pt was orthostatic upon completion of HD and has required additional bolus of NS - denies nausea at the time of the examination  Active Problems:  Chronic kidney disease (CKD), stage V  - nephrology team notified  - pt has HD TTS   CAD (coronary artery disease) of bypass graft  - clinically stable at this  point   Diabetes mellitus  - stable during this hospital stay - pt can continue her home medication regimen as previously recommended   COPD (chronic obstructive pulmonary disease)  - currenlty stable  - pt maintaining oxygen saturation above 97%  Hypertensive emergency, malignant hypertension - improved with HD - this will have to be followed by PCP and medications readjusted as indicated - I am reluctant to start additional antihypertensives on discharge as pt was slightly hypotensive after HD - for her it will be important to try to complete HD session if possible to maintain BP at target range - I do think that her initial BP was high because she has not completed HD that particular day  Bell's palsy  - stable  Hyperthyroidism  - continue synthroid   Consultants:  Nephrology  Procedures/Studies:   Dg Chest Port 1 View 10/30/2011   IMPRESSION:  Cardiomegaly. No acute infiltrate or pulmonary edema. Left basilar atelectasis.  Antibiotics:  None  Code Status: Full  Family Communication: Pt at bedside   Discharge Exam: Filed Vitals:   11/01/11 1525  BP: 133/77  Pulse: 94  Temp: 97.5 F (36.4 C)  Resp: 18   Filed Vitals:   11/01/11 1350 11/01/11 1351 11/01/11 1352 11/01/11 1525  BP: 136/71 128/69 84/56 133/77  Pulse: 102 101 112 94  Temp:    97.5 F (36.4 C)  TempSrc:    Oral  Resp:    18  Height:      Weight:      SpO2:    96%  General: Pt is alert, follows commands appropriately, not in acute distress Cardiovascular: Regular rate and rhythm, S1/S2 +, no murmurs, no rubs, no gallops Respiratory: Clear to auscultation bilaterally, no crackles and no rales, no wheezing Abdominal: Soft, non tender, non distended, bowel sounds +, no guarding Extremities: no edema, no cyanosis, pulses palpable bilaterally DP and PT Neuro: Grossly nonfocal  Discharge Instructions  Discharge Orders    Future Appointments: Provider: Department: Dept Phone: Center:    11/30/2011 9:00 AM [facility] [phone] [facility]   11/30/2011 9:30 AM [facility] [phone] [facility]   11/30/2011 10:30 AM Chuck Hint, MD [facility] [phone] [facility]       Medication List     As of 11/01/2011  3:27 PM    TAKE these medications         amLODipine 10 MG tablet   Commonly known as: NORVASC   Take 10 mg by mouth daily.      aspirin 325 MG tablet   Take 325 mg by mouth daily.      atorvastatin 40 MG tablet   Commonly known as: LIPITOR   Take 40 mg by mouth daily.      clopidogrel 75 MG tablet   Commonly known as: PLAVIX   Take 75 mg by mouth daily.      fluticasone 110 MCG/ACT inhaler   Commonly known as: FLOVENT HFA   Inhale 1 puff into the lungs 2 (two) times daily.      gabapentin 100 MG capsule   Commonly known as: NEURONTIN   Take 1 capsule (100 mg total) by mouth 3 (three) times daily.      insulin aspart 100 UNIT/ML injection   Commonly known as: novoLOG   Inject 2 Units into the skin 3 (three) times daily with meals.      insulin aspart 100 UNIT/ML injection   Commonly known as: novoLOG   Inject 2-7 Units into the skin 3 (three) times daily before meals. Per sliding scale      insulin glargine 100 UNIT/ML injection   Commonly known as: LANTUS   Inject 38 Units into the skin at bedtime.      isosorbide mononitrate 30 MG 24 hr tablet   Commonly known as: IMDUR   Take 30 mg by mouth daily.      methimazole 10 MG tablet   Commonly known as: TAPAZOLE   Take 1 tablet (10 mg total) by mouth 3 (three) times daily.      metolazone 2.5 MG tablet   Commonly known as: ZAROXOLYN   Take 2.5 mg by mouth daily as needed.      metoprolol 50 MG tablet   Commonly known as: LOPRESSOR   Take 50 mg by mouth 2 (two) times daily.      metoprolol succinate 100 MG 24 hr tablet   Commonly known as: TOPROL-XL   Take 1 tablet (100 mg total) by mouth daily. Take with or immediately following a meal.       oxyCODONE-acetaminophen 5-325 MG per tablet   Commonly known as: PERCOCET/ROXICET   Take 1-2 tablets by mouth every 4 (four) hours as needed. pain      pantoprazole 40 MG tablet   Commonly known as: PROTONIX   Take 40 mg by mouth daily.      promethazine 12.5 MG tablet   Commonly known as: PHENERGAN   Take 1 tablet (12.5 mg total) by mouth every 6 (six) hours as needed for nausea.  ranolazine 500 MG 12 hr tablet   Commonly known as: RANEXA   Take 1,000 mg by mouth 2 (two) times daily.           Follow-up Information    Follow up with UPPIN,NINA, MD. In 2 weeks.   Contact information:   [address] [place] [address] [phone]           The results of significant diagnostics from this hospitalization (including imaging, microbiology, ancillary and laboratory) are listed below for reference.     Microbiology: No results found for this or any previous visit (from the past 240 hour(s)).   Labs: Basic Metabolic Panel:  Lab 11/01/11 8295 10/30/11 1936 10/30/11 0639 10/30/11 0026  NA 137 137 138 138  K 3.8 4.1 3.5 3.2*  CL 101 97 99 99  CO2 24 28 27 29   GLUCOSE 98 122* 80 89  BUN 25* 22 21 20   CREATININE 3.39* 2.84* 2.71* 2.57*  CALCIUM 9.2 9.7 9.3 9.3  MG -- -- -- 2.1  PHOS 4.3 -- -- --   Liver Function Tests:  Lab 11/01/11 0717 10/30/11 0026  AST -- 26  ALT -- 38*  ALKPHOS -- 84  BILITOT -- 0.7  PROT -- 6.8  ALBUMIN 3.6 3.7   No results found for this basename: LIPASE:5,AMYLASE:5 in the last 168 hours No results found for this basename: AMMONIA:5 in the last 168 hours CBC:  Lab 11/01/11 0717 10/31/11 0615 10/30/11 0639 10/30/11 0026  WBC 7.5 7.8 7.9 8.0  NEUTROABS -- -- -- 5.2  HGB 11.9* 12.9 12.5 12.0  HCT 35.9* 38.9 37.6 35.1*  MCV 96.8 96.8 96.7 95.6  PLT 273 281 292 299   Cardiac Enzymes:  Lab 10/30/11 1030 10/30/11 0639 10/30/11 0026  CKTOTAL 51 54 53  CKMB 2.1 1.9 1.8  CKMBINDEX -- -- --  TROPONINI <0.30 <0.30  <0.30   BNP: BNP (last 3 results)  Basename 06/05/11 0157  PROBNP 34667.0*   CBG:  Lab 11/01/11 1224 10/31/11 2110 10/31/11 1642 10/31/11 1131 10/31/11 0744  GLUCAP 224* 143* 119* 113* 103*   SIGNED: Time coordinating discharge: Over 30 minutes  Debbora Presto, MD  [facility] 11/01/2011, 3:27 PM Pager [phone]  If 7PM-7AM, please contact night-coverage [email or web address] Password [email or web address]

## 2011-11-01 NOTE — Consult Note (Signed)
Holly Ellison 11/01/2011 Holly Ellison D Requesting Physician:  Dr. Izola Price  Reason for Consult:  Need for dialysis and related services in pt with CP and nausea HPI: The patient is a 71 y.o. year-old AAF w hx of ESRD due to DM on HD for 2-3 months in [place], [place]. She has Bell's palsy, CAD w CABG 2012, DM2 and COPD (former smoker). Sometimes needs 02 at HD. She developed CP on HD last Sat 9/21 and HD was discontinued and pt sent to ED.  She was admitted by hospitalist team and ruled out for MI.  She has had some nausea and vomiting and hasn't had solid food yet.  No abd pain, no fevers or diarrhea. No sob today  ROS  no rash or jt pain  no cough, hemoptysis  no sore throat or HD  Past Medical History:  Past Medical History  Diagnosis Date  . Chronic kidney disease     just found out? Placed graft in [facility] Orvan [facility]er [facility].  started dialysis in 06/2011  . Arthritis     Osteoarthritis  . Neuropathy in diabetes   . Insomnia   . Asthma   . CAD (coronary artery disease) 2012    7 stents-First PCI was in [place].  The last time she had a procedure was  a stent last year, thena  CABG was undertaken in October [facility])  . GERD (gastroesophageal reflux disease)   . Diabetes mellitus     Type II  . COPD (chronic obstructive pulmonary disease)   . Hypertension   . Peripheral vascular disease     endarterectomy  . Anemia   . CHF (congestive heart failure)   . Coronary artery dilation   . Myocardial infarction 2013    x 3  . 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 Abdominal hysterectomy   . Coronary artery bypass graft 2012    7 stents  . Cea     Left  . Carpal tunnel release   . Av fistula placement   . Grave's disease     ? unclea rif ca or not     Family History:  Family History  Problem Relation Age of Onset  . Diabetes type II     Social History:  reports that she quit  smoking about 15 years ago. Her smoking use included Cigarettes. She has never used smokeless tobacco. She reports that she does not drink alcohol or use illicit drugs.  Allergies:  Allergies  Allergen Reactions  . Ciprocin-Fluocin-Procin (Fluocinolone Acetonide) Itching  . Dilaudid (Hydromorphone Hcl) Itching  . Morphine Sulfate Itching  . Opium Itching    Home medications: Prior to Admission medications   Medication Sig Start Date End Date Taking? Authorizing Provider  amLODipine (NORVASC) 10 MG tablet Take 10 mg by mouth daily.   Yes Historical Provider, MD  aspirin 325 MG tablet Take 325 mg by mouth daily.   Yes Historical Provider, MD  atorvastatin (LIPITOR) 40 MG tablet Take 40 mg by mouth daily.   Yes Historical Provider, MD  clopidogrel (PLAVIX) 75 MG tablet Take 75 mg by mouth daily.   Yes Historical Provider, MD  fluticasone (FLOVENT HFA) 110 MCG/ACT inhaler Inhale 1 puff into the lungs 2 (two) times daily. 06/09/11 06/08/12 Yes Rhetta Mura, MD  gabapentin (NEURONTIN) 100 MG capsule Take 1 capsule (100 mg total) by mouth 3 (three)  times daily. 06/09/11 06/08/12 Yes Rhetta Mura, MD  insulin aspart (NOVOLOG) 100 UNIT/ML injection Inject 2 Units into the skin 3 (three) times daily with meals. 06/09/11 06/08/12 Yes Rhetta Mura, MD  insulin aspart (NOVOLOG) 100 UNIT/ML injection Inject 2-7 Units into the skin 3 (three) times daily before meals. Per sliding scale   Yes Historical Provider, MD  insulin glargine (LANTUS) 100 UNIT/ML injection Inject 38 Units into the skin at bedtime.   Yes Historical Provider, MD  isosorbide mononitrate (IMDUR) 30 MG 24 hr tablet Take 30 mg by mouth daily.   Yes Historical Provider, MD  methimazole (TAPAZOLE) 10 MG tablet Take 1 tablet (10 mg total) by mouth 3 (three) times daily. 06/09/11 06/08/12 Yes Rhetta Mura, MD  metolazone (ZAROXOLYN) 2.5 MG tablet Take 2.5 mg by mouth daily as needed.   Yes Historical Provider, MD  metoprolol  (LOPRESSOR) 50 MG tablet Take 50 mg by mouth 2 (two) times daily.   Yes Historical Provider, MD  metoprolol succinate (TOPROL-XL) 100 MG 24 hr tablet Take 1 tablet (100 mg total) by mouth daily. Take with or immediately following a meal. 06/11/11 06/10/12 Yes Rhetta Mura, MD  oxyCODONE-acetaminophen (PERCOCET/ROXICET) 5-325 MG per tablet Take 1-2 tablets by mouth every 4 (four) hours as needed. pain   Yes Historical Provider, MD  pantoprazole (PROTONIX) 40 MG tablet Take 40 mg by mouth daily.   Yes Historical Provider, MD  ranolazine (RANEXA) 500 MG 12 hr tablet Take 1,000 mg by mouth 2 (two) times daily.   Yes Historical Provider, MD  promethazine (PHENERGAN) 12.5 MG tablet Take 1 tablet (12.5 mg total) by mouth every 6 (six) hours as needed for nausea. 06/09/11 06/16/11  Rhetta Mura, MD    Inpatient medications:    . amLODipine  10 mg Oral Daily  . antiseptic oral rinse  15 mL Mouth Rinse BID  . aspirin EC  325 mg Oral Daily  . atorvastatin  40 mg Oral Daily  . clopidogrel  75 mg Oral Q breakfast  . enoxaparin (LOVENOX) injection  30 mg Subcutaneous Daily  . fluticasone  1 puff Inhalation BID  . gabapentin  100 mg Oral TID  . hydrALAZINE  25 mg Oral Q8H  . insulin aspart  0-5 Units Subcutaneous QHS  . insulin aspart  0-9 Units Subcutaneous TID WC  . insulin glargine  28 Units Subcutaneous QHS  . methimazole  10 mg Oral TID  . nitroGLYCERIN  1 inch Topical Q6H  . pantoprazole  40 mg Oral Daily  . sodium chloride  3 mL Intravenous Q12H     Labs: Basic Metabolic Panel:  Lab 11/01/11 1610 10/30/11 1936 10/30/11 0639 10/30/11 0026  NA 137 137 138 138  K 3.8 4.1 3.5 3.2*  CL 101 97 99 99  CO2 24 28 27 29   GLUCOSE 98 122* 80 89  BUN 25* 22 21 20   CREATININE 3.39* 2.84* 2.71* 2.57*  ALB -- -- -- --  CALCIUM 9.2 9.7 9.3 9.3  PHOS 4.3 -- -- --   Liver Function Tests:  Lab 11/01/11 0717 10/30/11 0026  AST -- 26  ALT -- 38*  ALKPHOS -- 84  BILITOT -- 0.7  PROT -- 6.8   ALBUMIN 3.6 3.7   No results found for this basename: LIPASE:3,AMYLASE:3 in the last 168 hours No results found for this basename: AMMONIA:3 in the last 168 hours CBC:  Lab 11/01/11 0717 10/31/11 0615 10/30/11 0639 10/30/11 0026  WBC 7.5 7.8 7.9 8.0  NEUTROABS -- -- --  5.2  HGB 11.9* 12.9 12.5 12.0  HCT 35.9* 38.9 37.6 35.1*  MCV 96.8 96.8 96.7 95.6  PLT 273 281 292 299   PT/INR: @labrcntip (inr:5) Cardiac Enzymes:  Lab 10/30/11 1030 10/30/11 0639 10/30/11 0026  CKTOTAL 51 54 53  CKMB 2.1 1.9 1.8  CKMBINDEX -- -- --  TROPONINI <0.30 <0.30 <0.30   CBG:  Lab 10/31/11 2110 10/31/11 1642 10/31/11 1131 10/31/11 0744 10/30/11 2201  GLUCAP 143* 119* 113* 103* 108*    Iron Studies: No results found for this basename: IRON:30,TIBC:30,TRANSFERRIN:30,FERRITIN:30 in the last 168 hours  Xrays/Other Studies: Dg Chest Port 1 View  10/30/2011  *RADIOLOGY REPORT*  Clinical Data: Chest pain  PORTABLE CHEST - 1 VIEW  Comparison: 10/29/2011  Findings: Cardiomediastinal silhouette stable cardiomegaly again noted.  Status post CABG.  No acute infiltrate or pulmonary edema. Mild left basilar atelectasis.  IMPRESSION: Cardiomegaly.  No acute infiltrate or pulmonary edema.  Left basilar atelectasis.   Original Report Authenticated By: Natasha Mead, M.D.     Physical Exam:  Blood pressure 141/65, pulse 85, temperature 97.5 F (36.4 C), temperature source Oral, resp. rate 14, height 5\' 6"  (1.676 m), weight 73.25 kg (161 lb 7.8 oz), SpO2 95.00%.   Gen: no distress, L eye closed Skin: no rash, cyanosis HEENT:  EOMI, sclera anicteric, throat clear Neck: no JVD, no bruits or LAN Chest: clear bilat, no rales Heart: regular, no rub or gallop, no murmur Abdomen: soft, nontender, no ascites or masses Ext: no edema LE's, LUA AVF + bruit Neuro: alert, Ox3, no focal deficit  Outpatient HD: [facility], 4 hr, 76.5 kg EDW, 2/2.25 bath, LUA AVF.  Zemplar 2ug, EPO ?, venofer 100/wk   Impression 1. ESRD hd  tts, continue schedule.  HD today. She is below dry weight, BP up, UF 2 kg as tolerated. 2. Chest pain, resolved, ruled out for MI, hx CABG/MI's- pre primary 3. Nausea- still present, per primary- ? Adv diet 4. HTN- takes norvasc 10, metoprolol 50bid at home, continue 5. CAD/CABG 2012- on ranexa 6. DM, on insulin 7. HPTH, cont vit D 8. Anemia- Hb above goal, not on EPO, gets weekly IV iron  P- HD today, IV fe, vit D   Vinson Moselle  MD [facility] [phone] pgr    [phone] cell 11/01/2011, 10:20 AM

## 2011-11-02 LAB — BASIC METABOLIC PANEL
CO2: 28 mEq/L (ref 19–32)
Calcium: 9 mg/dL (ref 8.4–10.5)
GFR calc non Af Amer: 15 mL/min — ABNORMAL LOW (ref >90)
Glucose, Bld: 112 mg/dL — ABNORMAL HIGH (ref 70–99)
Potassium: 3.7 mEq/L (ref 3.5–5.1)
Sodium: 138 mEq/L (ref 135–145)

## 2011-11-02 LAB — CBC
Hemoglobin: 12.4 g/dL (ref 12.0–15.0)
MCHC: 33.3 g/dL (ref 30.0–36.0)
Platelets: 257 10*3/uL (ref 150–400)
RBC: 3.79 MIL/uL — ABNORMAL LOW (ref 3.87–5.11)

## 2011-11-02 LAB — GLUCOSE, CAPILLARY

## 2011-11-02 MED ORDER — CLOPIDOGREL BISULFATE 75 MG PO TABS: 75.0000 mg | ORAL_TABLET | Freq: Every day | ORAL | Status: AC

## 2011-11-02 MED ORDER — OXYCODONE-ACETAMINOPHEN 5-325 MG PO TABS: 1.0000 | ORAL_TABLET | ORAL | Status: DC | PRN

## 2011-11-02 NOTE — Progress Notes (Signed)
Physical Therapy Treatment Patient Details Name: Holly Ellison MRN: [id number] DOB: [birth year] Today's Date: 11/02/2011 Time: 1308-6578 PT Time Calculation (min): 10 min  PT Assessment / Plan / Recommendation Comments on Treatment Session  Making improvements, movign well; progressing amb; OK for dc from PT standpoint    Follow Up Recommendations  Home health PT;Supervision - Intermittent    Barriers to Discharge        Equipment Recommendations  3 in 1 bedside comode    Recommendations for Other Services    Frequency Min 3X/week   Plan Discharge plan remains appropriate    Precautions / Restrictions Precautions Precautions: Fall Precaution Comments: slight fall risk, which is much better when pt uses RW; left a rW in room   Pertinent Vitals/Pain no apparent distress     Mobility  Transfers Transfers: Sit to Stand;Stand to Sit Sit to Stand: 6: Modified independent (Device/Increase time) Stand to Sit: 6: Modified independent (Device/Increase time) Ambulation/Gait Ambulation/Gait Assistance: 5: Supervision;4: Min guard Ambulation Distance (Feet): 200 Feet Assistive device: None;1 person hand held assist Ambulation/Gait Assistance Details: Initially requiring no assistive device, gait grossly WNL without loss of balance; Noted with incr distanc, pt was appropriately reaching out for UE support for greater stability with fatigue; Will likely not be walking these distances typically at hoem Gait Pattern: Step-through pattern Stairs: Yes Stairs Assistance: 4: Min guard Stair Management Technique: One rail Right;Step to pattern Number of Stairs: 3  (to simulate community access)    Exercises     PT Diagnosis:    PT Problem List:   PT Treatment Interventions:     PT Goals Acute Rehab PT Goals Time For Goal Achievement: 11/07/11 Potential to Achieve Goals: Good Pt will go Sit to Stand: with modified independence PT Goal: Sit to Stand - Progress: Met Pt will go Stand to  Sit: with modified independence PT Goal: Stand to Sit - Progress: Met Pt will Ambulate: >150 feet;with modified independence;with least restrictive assistive device PT Goal: Ambulate - Progress: Progressing toward goal  Visit Information  Last PT Received On: 11/02/11 Assistance Needed: +1    Subjective Data  Subjective: Agreeable to amb Patient Stated Goal: get home   Cognition  Overall Cognitive Status: Appears within functional limits for tasks assessed/performed Arousal/Alertness: Awake/alert Orientation Level: Appears intact for tasks assessed Behavior During Session: [facility] for tasks performed    Balance     End of Session PT - End of Session Activity Tolerance: Patient tolerated treatment well Patient left: in bed;with call bell/phone within reach Nurse Communication: Mobility status   GP     [facility] [place], [place] [phone]  11/02/2011, 4:32 PM

## 2011-11-02 NOTE — Progress Notes (Addendum)
Subjective: No complaints, feels great, wants to go home. Nausea resolved with fluid bolus and IVF's overnight.   Objective Vital signs in last 24 hours: Filed Vitals:   11/01/11 2104 11/02/11 0523 11/02/11 0813 11/02/11 0845  BP: 176/78 169/76  143/69  Pulse: 96 88  84  Temp: 99.9 F (37.7 C) 98.8 F (37.1 C)  97.8 F (36.6 C)  TempSrc: Oral Oral  Oral  Resp: 18 18  18   Height: 5\' 6"  (1.676 m)     Weight: 70.4 kg (155 lb 3.3 oz)     SpO2: 95% 94% 94% 95%   Weight change: -6.1 kg (-13 lb 7.2 oz)  Intake/Output Summary (Last 24 hours) at 11/02/11 1225 Last data filed at 11/02/11 0900  Gross per 24 hour  Intake   1720 ml  Output      0 ml  Net   1720 ml   Labs: Basic Metabolic Panel:  Lab 11/02/11 1610 11/01/11 0717 10/30/11 1936 10/30/11 0639 10/30/11 0026  NA 138 137 137 138 138  K 3.7 3.8 4.1 3.5 3.2*  CL 99 101 97 99 99  CO2 28 24 28 27 29   GLUCOSE 112* 98 122* 80 89  BUN 15 25* 22 21 20   CREATININE 2.92* 3.39* 2.84* 2.71* 2.57*  ALB -- -- -- -- --  CALCIUM 9.0 9.2 9.7 9.3 9.3  PHOS -- 4.3 -- -- --   Liver Function Tests:  Lab 11/01/11 0717 10/30/11 0026  AST -- 26  ALT -- 38*  ALKPHOS -- 84  BILITOT -- 0.7  PROT -- 6.8  ALBUMIN 3.6 3.7   No results found for this basename: LIPASE:3,AMYLASE:3 in the last 168 hours No results found for this basename: AMMONIA:3 in the last 168 hours CBC:  Lab 11/02/11 0610 11/01/11 0717 10/31/11 0615 10/30/11 0639 10/30/11 0026  WBC 7.8 7.5 7.8 7.9 --  NEUTROABS -- -- -- -- 5.2  HGB 12.4 11.9* 12.9 12.5 --  HCT 37.2 35.9* 38.9 37.6 --  MCV 98.2 96.8 96.8 96.7 --  PLT 257 273 281 292 --   PT/INR: @labrcntip (inr:5) Cardiac Enzymes:  Lab 10/30/11 1030 10/30/11 0639 10/30/11 0026  CKTOTAL 51 54 53  CKMB 2.1 1.9 1.8  CKMBINDEX -- -- --  TROPONINI <0.30 <0.30 <0.30   CBG:  Lab 11/02/11 0740 11/01/11 2111 11/01/11 1702 11/01/11 1224 10/31/11 2110  GLUCAP 120* 151* 103* 224* 143*    Iron Studies: No results  found for this basename: IRON:30,TIBC:30,TRANSFERRIN:30,FERRITIN:30 in the last 168 hours  Physical Exam:  Blood pressure 143/69, pulse 84, temperature 97.8 F (36.6 C), temperature source Oral, resp. rate 18, height 5\' 6"  (1.676 m), weight 70.4 kg (155 lb 3.3 oz), SpO2 95.00%.  Gen: no distress, L eye closed  Skin: no rash, cyanosis  HEENT: EOMI, sclera anicteric, throat clear  Neck: no JVD, no bruits or LAN  Chest: clear bilat, no rales  Heart: regular, no rub or gallop, no murmur  Abdomen: soft, nontender, no ascites or masses  Ext: no edema LE's, LUA AVF + bruit  Neuro: alert, Ox3, no focal deficit   Outpatient HD: [facility], 4 hr, 76.5 kg EDW, 2/2.25 bath, LUA AVF. Zemplar 2ug, EPO ?, venofer 100/wk   Impression  1. ESRD hd tts 2. Chest pain, resolved, ruled out for MI, hx CABG/MI's 3. Nausea- due to overdialysis/vol depletion > pt was orthostatic yesterday and got a fluid bolus with IVF's overnight and today weight is up to 74kg on standing scale  and nausea is resolved. OK for discharge from a renal standpoint 4. HTN- takes norvasc 10, metoprolol 50bid at home, no change 5. CAD/CABG 2012- on ranexa 6. DM, on insulin 7. HPTH, cont vit D 8. Anemia- Hb above goal, not on EPO, gets weekly IV iron 9. Dispo- OK for discharge today from a renal standpoint, discussed with Dr. Brien Few. No renal medication changes. Will lower dry weight to 75 kg.   Vinson Moselle  MD [facility] [phone] pgr    [phone] cell 11/02/2011, 12:25 PM

## 2011-11-02 NOTE — Progress Notes (Signed)
Following for d/c needs, MD please order HHPT and DME as recommended by PT is appropriate.  Thanks, CRoyal RN MPH, case manager [phone]

## 2011-11-02 NOTE — Discharge Summary (Addendum)
Physician Discharge Summary  Holly Ellison ZOX:[id number] DOB: November 12, 1940 DOA: 10/29/2011  PCP: Lucianne Lei, MD  Admit date: 10/29/2011 Discharge date: 11/02/2011  Recommendations for Outpatient Follow-up:  1. Follow up with primary MD. 2. Urged to keep neurology appointment.    Discharge Diagnoses:  Principal Problem:  *Chest pain Active Problems:  Chronic kidney disease (CKD), stage V  CAD (coronary artery disease) of bypass graft  Diabetes mellitus  COPD (chronic obstructive pulmonary disease)  Hypertensive emergency  Bell's palsy  Hyperthyroidism   Discharge Condition: Satisfactory.  Diet recommendation: Renal 80-2-2.   Filed Weights   11/01/11 0645 11/01/11 1118 11/01/11 2104  Weight: 73.25 kg (161 lb 7.8 oz) 70.5 kg (155 lb 6.8 oz) 70.4 kg (155 lb 3.3 oz)    History of present illness:  The patient is a 71 y.o. 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 HD was discontinued and patient sent to ED. She was admitted for further management. She has had some nausea and vomiting, but no abdominal pain, fever or diarrhea.   Hospital Course:   1. Chest pain:   Patient presented with chest pain, which occurred during dialysis, lasted for about 20 minutes, and has not recurred. She does have a history of CAD, s/p CABG. Cardiac enzymes were cycled, and remained unelevated, 12-lead EKG was devoid of acute ischemic changes . No arrhythmias were documented on telemetric monitoring. Possible etiologies are musculo-skeletal pain vs anginal episode. She is on low dose ASA, as well as Plavix.  2. Nausea/Vomiting:   Likely a self-limited episode of gastritis. Patient was managed symptomatically, and was able to gradually advance diet, without relapse.  3. Chronic kidney disease (CKD), stage V:  Dr Delano Metz provided  nephrology consultation, and patient was re-intated on her regular dialysis schedule (T-T-S). 4. CAD (coronary artery disease) of bypass graft  -Se above discussion. Clinically stable, otherwise.  5. Diabetes mellitus:  CBGs remained reasonably controlled on diet and SSI. HBA1C was 5.6.  6. COPD (chronic obstructive pulmonary disease):  Stable/Asymptomatic.  7. Hypertensive emergency:  BP was marked ly elevated at 214/91 at presentation, but improved with dialysis,and titration of antihypertensives. As of 11/02/11, BP was far more reasonable at 143/60.  8. Bell's palsy: Patient reportedly, developed Bell's Palsy about 2 weeks ago. This has improved, but interestingly, she does have complete left ptosis, which is somewhat unusual for this disorder. She assures me that she already has a neurolgy appointment scheduled, and I urged her to keep that appointment.  9. Hyperthyroidism:   Continued on Methimazole.      Procedures:  See below.   Consultations:  Dr Delano Metz, nephrologist.   Discharge Exam: Filed Vitals:   11/02/11 0523 11/02/11 0813 11/02/11 0845 11/02/11 1400  BP: 169/76  143/69 143/60  Pulse: 88  84 90  Temp: 98.8 F (37.1 C)  97.8 F (36.6 C) 98.6 F (37 C)  TempSrc: Oral  Oral Oral  Resp: 18  18 20   Height:      Weight:      SpO2: 94% 94% 95% 96%    General: Comfortable, alert, communicative, fully oriented, not short of breath at rest.  HEENT:  No clinical pallor, no jaundice, no conjunctival injection or discharge. Hydration is satisfactory.  NECK:  Supple, JVP not seen, no carotid bruits, no palpable  lymphadenopathy, no palpable goiter. CHEST:  Clinically clear to auscultation, no wheezes, no crackles. HEART:  Sounds 1 and 2 heard, normal, regular, no murmurs. ABDOMEN:  Full, soft, non-tender, no palpable organomegaly, no palpable masses, normal bowel sounds. GENITALIA:  Not examined. LOWER EXTREMITIES:  No pitting edema, palpable peripheral  pulses. MUSCULOSKELETAL SYSTEM:  Generalized osteoarthritic changes, otherwise, normal. CENTRAL NERVOUS SYSTEM:  Patient has left complete ptosis, and smoothening of left naso-labial fold.  focal neurologic deficit on gross examination.   Discharge Instructions      Discharge Orders    Future Appointments: Provider: Department: Dept Phone: Center:   11/30/2011 9:00 AM [facility] [phone] [facility]   11/30/2011 9:30 AM [facility] [phone] [facility]   11/30/2011 10:30 AM Chuck Hint, MD [facility] [phone] [facility]     Future Orders Please Complete By Expires   Diet - low sodium heart healthy      Increase activity slowly          Medication List     As of 11/02/2011  4:31 PM    TAKE these medications         amLODipine 10 MG tablet   Commonly known as: NORVASC   Take 10 mg by mouth daily.      aspirin 325 MG tablet   Take 325 mg by mouth daily.      atorvastatin 40 MG tablet   Commonly known as: LIPITOR   Take 40 mg by mouth daily.      clopidogrel 75 MG tablet   Commonly known as: PLAVIX   Take 1 tablet (75 mg total) by mouth daily.      fluticasone 110 MCG/ACT inhaler   Commonly known as: FLOVENT HFA   Inhale 1 puff into the lungs 2 (two) times daily.      gabapentin 100 MG capsule   Commonly known as: NEURONTIN   Take 1 capsule (100 mg total) by mouth 3 (three) times daily.      insulin aspart 100 UNIT/ML injection   Commonly known as: novoLOG   Inject 2 Units into the skin 3 (three) times daily with meals.      insulin aspart 100 UNIT/ML injection   Commonly known as: novoLOG   Inject 2-7 Units into the skin 3 (three) times daily before meals. Per sliding scale      insulin glargine 100 UNIT/ML injection   Commonly known as: LANTUS   Inject 38 Units into the skin at bedtime.      isosorbide mononitrate 30 MG 24 hr tablet   Commonly known as: IMDUR   Take 30 mg by mouth daily.      methimazole 10 MG tablet    Commonly known as: TAPAZOLE   Take 1 tablet (10 mg total) by mouth 3 (three) times daily.      metolazone 2.5 MG tablet   Commonly known as: ZAROXOLYN   Take 2.5 mg by mouth daily as needed.      metoprolol 50 MG tablet   Commonly known as: LOPRESSOR   Take 50 mg by mouth 2 (two) times daily.      metoprolol succinate 100 MG 24 hr tablet   Commonly known as: TOPROL-XL   Take 1 tablet (100 mg total) by mouth daily. Take with or immediately following a meal.      oxyCODONE-acetaminophen 5-325 MG per tablet   Commonly known as: PERCOCET/ROXICET   Take 1 tablet by mouth every 4 (four) hours as  needed for pain. pain      pantoprazole 40 MG tablet   Commonly known as: PROTONIX   Take 40 mg by mouth daily.      promethazine 12.5 MG tablet   Commonly known as: PHENERGAN   Take 1 tablet (12.5 mg total) by mouth every 6 (six) hours as needed for nausea.      ranolazine 500 MG 12 hr tablet   Commonly known as: RANEXA   Take 1,000 mg by mouth 2 (two) times daily.         Follow-up Information    Follow up with UPPIN,NINA, MD. In 2 weeks.   Contact information:   [address] [place] [address] [phone]       Please follow up. (Folow up with primary nehrologist.)       Please follow up. (Please keep neurology appointment. )           The results of significant diagnostics from this hospitalization (including imaging, microbiology, ancillary and laboratory) are listed below for reference.    Significant Diagnostic Studies: Dg Chest Port 1 View  10/30/2011  *RADIOLOGY REPORT*  Clinical Data: Chest pain  PORTABLE CHEST - 1 VIEW  Comparison: 10/29/2011  Findings: Cardiomediastinal silhouette stable cardiomegaly again noted.  Status post CABG.  No acute infiltrate or pulmonary edema. Mild left basilar atelectasis.  IMPRESSION: Cardiomegaly.  No acute infiltrate or pulmonary edema.  Left basilar atelectasis.   Original Report Authenticated By: Natasha Mead, M.D.      Microbiology: No results found for this or any previous visit (from the past 240 hour(s)).   Labs: Basic Metabolic Panel:  Lab 11/02/11 4696 11/01/11 0717 10/30/11 1936 10/30/11 0639 10/30/11 0026  NA 138 137 137 138 138  K 3.7 3.8 4.1 3.5 3.2*  CL 99 101 97 99 99  CO2 28 24 28 27 29   GLUCOSE 112* 98 122* 80 89  BUN 15 25* 22 21 20   CREATININE 2.92* 3.39* 2.84* 2.71* 2.57*  CALCIUM 9.0 9.2 9.7 9.3 9.3  MG -- -- -- -- 2.1  PHOS -- 4.3 -- -- --   Liver Function Tests:  Lab 11/01/11 0717 10/30/11 0026  AST -- 26  ALT -- 38*  ALKPHOS -- 84  BILITOT -- 0.7  PROT -- 6.8  ALBUMIN 3.6 3.7   No results found for this basename: LIPASE:5,AMYLASE:5 in the last 168 hours No results found for this basename: AMMONIA:5 in the last 168 hours CBC:  Lab 11/02/11 0610 11/01/11 0717 10/31/11 0615 10/30/11 0639 10/30/11 0026  WBC 7.8 7.5 7.8 7.9 8.0  NEUTROABS -- -- -- -- 5.2  HGB 12.4 11.9* 12.9 12.5 12.0  HCT 37.2 35.9* 38.9 37.6 35.1*  MCV 98.2 96.8 96.8 96.7 95.6  PLT 257 273 281 292 299   Cardiac Enzymes:  Lab 10/30/11 1030 10/30/11 0639 10/30/11 0026  CKTOTAL 51 54 53  CKMB 2.1 1.9 1.8  CKMBINDEX -- -- --  TROPONINI <0.30 <0.30 <0.30   BNP: BNP (last 3 results)  Basename 06/05/11 0157  PROBNP 34667.0*   CBG:  Lab 11/02/11 1115 11/02/11 0740 11/01/11 2111 11/01/11 1702 11/01/11 1224  GLUCAP 170* 120* 151* 103* 224*    Time coordinating discharge: 40 minutes  Signed:  Chukwuemeka Artola,CHRISTOPHER  [facility] 11/02/2011, 4:31 PM

## 2011-11-02 NOTE — Clinical Documentation Improvement (Signed)
Hypertension Documentation Clarification Query  THIS DOCUMENT IS NOT A PERMANENT PART OF THE MEDICAL RECORD  TO RESPOND TO THE THIS QUERY, FOLLOW THE INSTRUCTIONS BELOW:  1. If needed, update documentation for the patient's encounter via the notes activity.  2. Access this query again and click edit on the In [employer].  3. After updating, or not, click F2 to complete all highlighted (required) fields concerning your review. Select "additional documentation in the medical record" OR "no additional documentation provided".  4. Click Sign note button.  5. The deficiency will fall out of your In Basket *Please let us know if you are not able to complete this workflow by phone or e-mail (listed below).        11/02/11  Dear Dr. Izola Price Marton Redwood  In an effort to better capture your patient's severity of illness, reflect appropriate length of stay and utilization of resources, a review of the patient medical record has revealed the following indicators.    Based on your clinical judgment, please clarify and document in a progress note and/or discharge summary the clinical condition associated with the following supporting information:  Also can you indicate if any of the below conditions are the principle reason for admission.  In responding to this query please exercise your independent judgment.  The fact that a query is asked, does not imply that any particular answer is desired or expected.  Possible Clinical Conditions?   " Accelerated Hypertension  " Malignant Hypertension  " Or Other Condition   " Cannot Clinically Determine    Supporting Information:  Risk Factors: (As per notes) " h/o Hypertension, diabetes and ESRD on HD is being transferred from [facility] to [facility] for chest pain started this afternoon associated with hypertensive urgency. Chest pain resolved with control of blood pressure "   Signs and Symptoms:(As per notes) "Hypertensive  emergency"   Diagnostics: BP: 10-29-11 @2204 =214/91   Basename  06/05/11 0157   PROBNP  16109.6*    Treatment: (As per orders)   IV Medications:  hydrALAZINE (APRESOLINE) injection 10 mg   [[id number]]     Ordered Dose: 10 mg  Route: Intravenous  Frequency: Every 6 hours PRN for sbp > 170   Oral medications: hydrALAZINE (APRESOLINE) tablet 25 mg   [[id number]]     Ordered Dose: 25 mg  Route: Oral  Frequency: 3 times per     amLODipine (NORVASC) tablet 10 mg   [[id number]]     Ordered Dose: 10 mg  Route: Oral  Frequency: Daily     Ordered Dose: 10 mg  Route: Oral  Frequency: Daily     metolazone (ZAROXOLYN) 2.5 MG tablet [[id number]]    Order Details      Dose: 2.5 mg  Route: Oral  Frequency: Daily PRN          You may use possible, probable, or suspect with inpatient documentation. Possible, probable, suspected diagnoses MUST be documented at the time of discharge.  Reviewed:  Agree with malignant HTN and chest pain being resolved with BP control. I placed in d/c summary,thank Mylen Mangan Thank You,  Joanette Gula Delk RN, BSN Clinical Documentation Specialist: [phone] Pager Health Information Management [place]

## 2011-11-30 ENCOUNTER — Ambulatory Visit: Payer: Medicare Other | Admitting: Neurosurgery

## 2011-11-30 ENCOUNTER — Other Ambulatory Visit: Payer: Medicare Other

## 2012-07-27 ENCOUNTER — Inpatient Hospital Stay (HOSPITAL_COMMUNITY)
Admission: AD | Admit: 2012-07-27 | Discharge: 2012-07-31 | DRG: 314 | Disposition: A | Discharge status: Home / Self Care | Payer: Medicare Other | Source: Other Acute Inpatient Hospital | Attending: Internal Medicine | Admitting: Internal Medicine

## 2012-07-27 ENCOUNTER — Encounter (HOSPITAL_COMMUNITY): Payer: Self-pay | Admitting: *Deleted

## 2012-07-27 ENCOUNTER — Inpatient Hospital Stay (HOSPITAL_COMMUNITY)
Admission: AD | Admit: 2012-07-27 | Payer: Self-pay | Source: Other Acute Inpatient Hospital | Admitting: Internal Medicine

## 2012-07-27 DIAGNOSIS — Z951 Presence of aortocoronary bypass graft: Secondary | ICD-10-CM

## 2012-07-27 DIAGNOSIS — M949 Disorder of cartilage, unspecified: Secondary | ICD-10-CM | POA: Diagnosis present

## 2012-07-27 DIAGNOSIS — R7881 Bacteremia: Secondary | ICD-10-CM | POA: Diagnosis present

## 2012-07-27 DIAGNOSIS — G510 Bell's palsy: Secondary | ICD-10-CM

## 2012-07-27 DIAGNOSIS — Z992 Dependence on renal dialysis: Secondary | ICD-10-CM

## 2012-07-27 DIAGNOSIS — I252 Old myocardial infarction: Secondary | ICD-10-CM

## 2012-07-27 DIAGNOSIS — A4901 Methicillin susceptible Staphylococcus aureus infection, unspecified site: Secondary | ICD-10-CM | POA: Diagnosis present

## 2012-07-27 DIAGNOSIS — Z87891 Personal history of nicotine dependence: Secondary | ICD-10-CM

## 2012-07-27 DIAGNOSIS — E0590 Thyrotoxicosis, unspecified without thyrotoxic crisis or storm: Secondary | ICD-10-CM

## 2012-07-27 DIAGNOSIS — N186 End stage renal disease: Secondary | ICD-10-CM

## 2012-07-27 DIAGNOSIS — J811 Chronic pulmonary edema: Secondary | ICD-10-CM

## 2012-07-27 DIAGNOSIS — I509 Heart failure, unspecified: Secondary | ICD-10-CM | POA: Diagnosis present

## 2012-07-27 DIAGNOSIS — J189 Pneumonia, unspecified organism: Secondary | ICD-10-CM

## 2012-07-27 DIAGNOSIS — Z7982 Long term (current) use of aspirin: Secondary | ICD-10-CM

## 2012-07-27 DIAGNOSIS — B9561 Methicillin susceptible Staphylococcus aureus infection as the cause of diseases classified elsewhere: Secondary | ICD-10-CM

## 2012-07-27 DIAGNOSIS — I2510 Atherosclerotic heart disease of native coronary artery without angina pectoris: Secondary | ICD-10-CM | POA: Diagnosis present

## 2012-07-27 DIAGNOSIS — J4489 Other specified chronic obstructive pulmonary disease: Secondary | ICD-10-CM | POA: Diagnosis present

## 2012-07-27 DIAGNOSIS — M899 Disorder of bone, unspecified: Secondary | ICD-10-CM | POA: Diagnosis present

## 2012-07-27 DIAGNOSIS — I161 Hypertensive emergency: Secondary | ICD-10-CM

## 2012-07-27 DIAGNOSIS — R079 Chest pain, unspecified: Secondary | ICD-10-CM

## 2012-07-27 DIAGNOSIS — M1990 Unspecified osteoarthritis, unspecified site: Secondary | ICD-10-CM | POA: Diagnosis present

## 2012-07-27 DIAGNOSIS — E119 Type 2 diabetes mellitus without complications: Secondary | ICD-10-CM

## 2012-07-27 DIAGNOSIS — G629 Polyneuropathy, unspecified: Secondary | ICD-10-CM

## 2012-07-27 DIAGNOSIS — I25810 Atherosclerosis of coronary artery bypass graft(s) without angina pectoris: Secondary | ICD-10-CM

## 2012-07-27 DIAGNOSIS — E1149 Type 2 diabetes mellitus with other diabetic neurological complication: Secondary | ICD-10-CM | POA: Diagnosis present

## 2012-07-27 DIAGNOSIS — Y849 Medical procedure, unspecified as the cause of abnormal reaction of the patient, or of later complication, without mention of misadventure at the time of the procedure: Secondary | ICD-10-CM | POA: Diagnosis present

## 2012-07-27 DIAGNOSIS — T827XXA Infection and inflammatory reaction due to other cardiac and vascular devices, implants and grafts, initial encounter: Principal | ICD-10-CM | POA: Diagnosis present

## 2012-07-27 DIAGNOSIS — I739 Peripheral vascular disease, unspecified: Secondary | ICD-10-CM

## 2012-07-27 DIAGNOSIS — I10 Essential (primary) hypertension: Secondary | ICD-10-CM | POA: Diagnosis present

## 2012-07-27 DIAGNOSIS — L98499 Non-pressure chronic ulcer of skin of other sites with unspecified severity: Secondary | ICD-10-CM

## 2012-07-27 DIAGNOSIS — A419 Sepsis, unspecified organism: Secondary | ICD-10-CM

## 2012-07-27 DIAGNOSIS — Z79899 Other long term (current) drug therapy: Secondary | ICD-10-CM

## 2012-07-27 DIAGNOSIS — Z794 Long term (current) use of insulin: Secondary | ICD-10-CM

## 2012-07-27 DIAGNOSIS — D649 Anemia, unspecified: Secondary | ICD-10-CM | POA: Diagnosis present

## 2012-07-27 DIAGNOSIS — K219 Gastro-esophageal reflux disease without esophagitis: Secondary | ICD-10-CM | POA: Diagnosis present

## 2012-07-27 DIAGNOSIS — J449 Chronic obstructive pulmonary disease, unspecified: Secondary | ICD-10-CM | POA: Diagnosis present

## 2012-07-27 DIAGNOSIS — I120 Hypertensive chronic kidney disease with stage 5 chronic kidney disease or end stage renal disease: Secondary | ICD-10-CM | POA: Diagnosis present

## 2012-07-27 MED ORDER — CLOPIDOGREL BISULFATE 75 MG PO TABS
75.0000 mg | ORAL_TABLET | Freq: Every day | ORAL | Status: DC
  Administered 2012-07-28 – 2012-07-31 (×4): 75 mg via ORAL
  Filled 2012-07-27 (×4): qty 1

## 2012-07-27 MED ORDER — INSULIN ASPART 100 UNIT/ML ~~LOC~~ SOLN
0.0000 [IU] | Freq: Three times a day (TID) | SUBCUTANEOUS | Status: DC
  Administered 2012-07-29 (×2): 1 [IU] via SUBCUTANEOUS
  Administered 2012-07-31: 17:00:00 via SUBCUTANEOUS

## 2012-07-27 MED ORDER — ASPIRIN 325 MG PO TABS
325.0000 mg | ORAL_TABLET | Freq: Every day | ORAL | Status: DC
  Administered 2012-07-28 – 2012-07-30 (×3): 325 mg via ORAL
  Filled 2012-07-27 (×3): qty 1

## 2012-07-27 MED ORDER — ISOSORBIDE MONONITRATE ER 30 MG PO TB24
30.0000 mg | ORAL_TABLET | Freq: Every day | ORAL | Status: DC
  Administered 2012-07-28 – 2012-07-31 (×4): 30 mg via ORAL
  Filled 2012-07-27 (×4): qty 1

## 2012-07-27 MED ORDER — ENOXAPARIN SODIUM 30 MG/0.3ML ~~LOC~~ SOLN
30.0000 mg | SUBCUTANEOUS | Status: DC
  Administered 2012-07-28: 30 mg via SUBCUTANEOUS
  Filled 2012-07-27: qty 0.3

## 2012-07-27 MED ORDER — ACETAMINOPHEN 325 MG PO TABS
650.0000 mg | ORAL_TABLET | Freq: Four times a day (QID) | ORAL | Status: DC | PRN
  Administered 2012-07-28 (×2): 650 mg via ORAL
  Filled 2012-07-27 (×2): qty 2

## 2012-07-27 MED ORDER — SODIUM CHLORIDE 0.9 % IJ SOLN
3.0000 mL | Freq: Two times a day (BID) | INTRAMUSCULAR | Status: DC
  Administered 2012-07-28 – 2012-07-29 (×2): 3 mL via INTRAVENOUS

## 2012-07-27 MED ORDER — ATORVASTATIN CALCIUM 40 MG PO TABS
40.0000 mg | ORAL_TABLET | Freq: Every day | ORAL | Status: DC
  Administered 2012-07-28 – 2012-07-31 (×4): 40 mg via ORAL
  Filled 2012-07-27 (×4): qty 1

## 2012-07-27 MED ORDER — METOPROLOL TARTRATE 50 MG PO TABS
50.0000 mg | ORAL_TABLET | Freq: Two times a day (BID) | ORAL | Status: DC
  Administered 2012-07-28 – 2012-07-31 (×8): 50 mg via ORAL
  Filled 2012-07-27 (×10): qty 1

## 2012-07-27 MED ORDER — ACETAMINOPHEN 650 MG RE SUPP: 650.0000 mg | Freq: Four times a day (QID) | RECTAL | Status: DC | PRN

## 2012-07-27 MED ORDER — PROMETHAZINE HCL 25 MG PO TABS: 12.5000 mg | ORAL_TABLET | Freq: Four times a day (QID) | ORAL | Status: DC | PRN

## 2012-07-27 MED ORDER — OXYCODONE-ACETAMINOPHEN 5-325 MG PO TABS
1.0000 | ORAL_TABLET | ORAL | Status: DC | PRN
  Administered 2012-07-28: 1 via ORAL
  Filled 2012-07-27: qty 1

## 2012-07-27 MED ORDER — AMLODIPINE BESYLATE 10 MG PO TABS
10.0000 mg | ORAL_TABLET | Freq: Every day | ORAL | Status: DC
  Administered 2012-07-28 – 2012-07-29 (×2): 10 mg via ORAL
  Filled 2012-07-27 (×3): qty 1

## 2012-07-27 MED ORDER — INSULIN GLARGINE 100 UNIT/ML ~~LOC~~ SOLN
38.0000 [IU] | Freq: Every day | SUBCUTANEOUS | Status: DC
  Administered 2012-07-28 (×2): 38 [IU] via SUBCUTANEOUS
  Filled 2012-07-27 (×3): qty 0.38

## 2012-07-27 MED ORDER — RANOLAZINE ER 500 MG PO TB12
1000.0000 mg | ORAL_TABLET | Freq: Two times a day (BID) | ORAL | Status: DC
  Administered 2012-07-28 – 2012-07-30 (×6): 1000 mg via ORAL
  Filled 2012-07-27 (×8): qty 2

## 2012-07-27 MED ORDER — ONDANSETRON HCL 4 MG PO TABS: 4.0000 mg | ORAL_TABLET | Freq: Four times a day (QID) | ORAL | Status: DC | PRN

## 2012-07-27 MED ORDER — PANTOPRAZOLE SODIUM 40 MG PO TBEC
40.0000 mg | DELAYED_RELEASE_TABLET | Freq: Every day | ORAL | Status: DC
  Administered 2012-07-28 – 2012-07-31 (×4): 40 mg via ORAL
  Filled 2012-07-27 (×4): qty 1

## 2012-07-27 MED ORDER — ONDANSETRON HCL 4 MG/2ML IJ SOLN: 4.0000 mg | Freq: Four times a day (QID) | INTRAMUSCULAR | Status: DC | PRN

## 2012-07-27 NOTE — H&P (Signed)
[facility] History and Physical  Holly Ellison ZOX:[id number] DOB: [birth year] DOA: 07/27/2012  Referring physician: Transferred from [facility] hospital for pneumonia in a patient on dialysis. PCP: Lucianne Lei, MD  Specialists: Nephrologist for dialysis.  Chief Complaint: Chest pain.  HPI: Holly Ellison is a 72 y.o. female with known history of CAD status post CABG, hypertension, diabetes mellitus type 2, ESRD on hemodialysis on Tuesday Thursday and Saturday started experiencing chest pain last 2 days on the left side of the chest. Patient's chest pain is pleuritic in nature with some nonproductive cough denies any associated fever chills. In the ER at [facility] patient was found to be febrile and chest x-ray showed left lower lobe opacity. EKG and troponins were unremarkable. Patient has been transferred to [facility] for further management as patient requires dialysis. Patient denies any nausea vomiting abdominal pain dysuria or discharges or diarrhea.  Review of Systems: As presented in the history of presenting illness, rest negative.  Past Medical History  Diagnosis Date  . Chronic kidney disease     just found out? Placed graft in [facility]Dr. Orvan Falconer in [facility].  started dialysis in 06/2011  . Arthritis     Osteoarthritis  . Neuropathy in diabetes   . Insomnia   . Asthma   . CAD (coronary artery disease) 2012    7 stents-First PCI was in [place].  The last time she had a procedure was  a stent last year, thena  CABG was undertaken in October [facility])  . GERD (gastroesophageal reflux disease)   . Diabetes mellitus     Type II  . COPD (chronic obstructive pulmonary disease)   . Hypertension   . Peripheral vascular disease     endarterectomy  . Anemia   . CHF (congestive heart failure)   . Coronary artery dilation   . Myocardial infarction 2013    x 3  . 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 Abdominal hysterectomy    . Coronary artery bypass graft  2012    7 stents  . Cea      Left  . Carpal tunnel release    . Av fistula placement    . Grave's disease      ? unclea rif ca or not    Social History:  reports that she quit smoking about 16 years ago. Her smoking use included Cigarettes. She smoked 0.00 packs per day. She has never used smokeless tobacco. She reports that she does not drink alcohol or use illicit drugs. Home. where does patient live-- Can do ADLs. Can patient participate in ADLs?  Allergies  Allergen Reactions  . Ciprocin-Fluocin-Procin (Fluocinolone Acetonide) Itching  . Dilaudid (Hydromorphone Hcl) Itching  . Morphine Sulfate Itching  . Opium Itching    Family History  Problem Relation Age of Onset  . Diabetes type II        Prior to Admission medications   Medication Sig Start Date End Date Taking? Authorizing Provider  amLODipine (NORVASC) 10 MG tablet Take 10 mg by mouth daily.    Historical Provider, MD  aspirin 325 MG tablet Take 325 mg by mouth daily.    Historical Provider, MD  atorvastatin (LIPITOR) 40 MG tablet Take 40 mg by mouth daily.    Historical Provider, MD  clopidogrel (PLAVIX) 75 MG tablet Take 1 tablet (75 mg total) by mouth daily. 11/02/11   Laveda Norman,  MD  insulin aspart (NOVOLOG) 100 UNIT/ML injection Inject 2 Units into the skin 3 (three) times daily with meals. 06/09/11 06/08/12  Rhetta Mura, MD  insulin aspart (NOVOLOG) 100 UNIT/ML injection Inject 2-7 Units into the skin 3 (three) times daily before meals. Per sliding scale    Historical Provider, MD  insulin glargine (LANTUS) 100 UNIT/ML injection Inject 38 Units into the skin at bedtime.    Historical Provider, MD  isosorbide mononitrate (IMDUR) 30 MG 24 hr tablet Take 30 mg by mouth daily.    Historical Provider, MD  metolazone (ZAROXOLYN) 2.5 MG tablet Take 2.5 mg by mouth daily as needed.    Historical Provider, MD  metoprolol (LOPRESSOR) 50 MG tablet Take  50 mg by mouth 2 (two) times daily.    Historical Provider, MD  oxyCODONE-acetaminophen (PERCOCET/ROXICET) 5-325 MG per tablet Take 1 tablet by mouth every 4 (four) hours as needed for pain. pain 11/02/11   Laveda Norman, MD  pantoprazole (PROTONIX) 40 MG tablet Take 40 mg by mouth daily.    Historical Provider, MD  promethazine (PHENERGAN) 12.5 MG tablet Take 1 tablet (12.5 mg total) by mouth every 6 (six) hours as needed for nausea. 06/09/11 06/16/11  Rhetta Mura, MD  ranolazine (RANEXA) 500 MG 12 hr tablet Take 1,000 mg by mouth 2 (two) times daily.    Historical Provider, MD   Physical Exam: Filed Vitals:   07/27/12 2252 07/27/12 2300 07/27/12 2323  BP: 200/64 191/62   Pulse:  82   Temp: 101.6 F (38.7 C)  102.1 F (38.9 C)  TempSrc: Oral  Oral  Resp:  27   Height: 5\' 6"  (1.676 m)    Weight: 76 kg (167 lb 8.8 oz)    SpO2: 100% 100%      General:  Well-developed well-nourished.  Eyes: Anicteric no pallor.  ENT: No discharge from ears eyes nose mouth.  Neck: No mass felt.  Cardiovascular: S1-S2 heard.  Respiratory: No rhonchi or crepitations.  Abdomen: Soft nontender bowel sounds present.  Skin: No rash.  Musculoskeletal: No edema.  Psychiatric: Appears normal.  Neurologic: Alert and oriented to time place and person. Moves all extremities.  Labs on Admission:  Basic Metabolic Panel: No results found for this basename: NA, K, CL, CO2, GLUCOSE, BUN, CREATININE, CALCIUM, MG, PHOS,  in the last 168 hours Liver Function Tests: No results found for this basename: AST, ALT, ALKPHOS, BILITOT, PROT, ALBUMIN,  in the last 168 hours No results found for this basename: LIPASE, AMYLASE,  in the last 168 hours No results found for this basename: AMMONIA,  in the last 168 hours CBC: No results found for this basename: WBC, NEUTROABS, HGB, HCT, MCV, PLT,  in the last 168 hours Cardiac Enzymes: No results found for this basename: CKTOTAL, CKMB, CKMBINDEX, TROPONINI,  in the  last 168 hours  BNP (last 3 results) No results found for this basename: PROBNP,  in the last 8760 hours CBG: No results found for this basename: GLUCAP,  in the last 168 hours  Radiological Exams on Admission: No results found.  EKG: Independently reviewed. Normal sinus rhythm.  Labs done at brand of show WBC count of 13.4, hemoglobin 11.9 hematocrit of 36.1 and platelets of 216. Sodium of 142 potassium 3.5 creatinine 4.5. Troponin was 0.07 ABG was showing a pH of 7.46/40/54.  Assessment/Plan Principal Problem:   HCAP (healthcare-associated pneumonia) Active Problems:   Diabetes mellitus   Hypertension   COPD (chronic obstructive pulmonary disease)   ESRD  on dialysis   1. Health care associated pneumonia - per reports blood cultures were sent from [facility]. Check urine cultures. Patient has been placed on vancomycin and cefepime. Patient has allergies to quinolones. 2. Diabetes mellitus type 2 - continue home medications with sliding-scale coverage. 3. ESRD on hemodialysis Tuesday Thursday and Saturday - consult nephrologist for dialysis. 4. Hypertension - continue home medications with when necessary IV hydralazine for systolic blood pressure more than 160. 5. COPD - presently not wheezing. 6. CAD status post CABG - chest pain is pleuritic.    Code Status: Full code.  Family Communication: None.  Disposition Plan: Admit to inpatient.    Abi Shoults N. [facility] Pager [phone].  If 7PM-7AM, please contact night-coverage [email or web address] Password [facility] 07/27/2012, 11:47 PM

## 2012-07-28 ENCOUNTER — Inpatient Hospital Stay (HOSPITAL_COMMUNITY): Payer: Medicare Other

## 2012-07-28 DIAGNOSIS — A419 Sepsis, unspecified organism: Secondary | ICD-10-CM

## 2012-07-28 DIAGNOSIS — R7881 Bacteremia: Secondary | ICD-10-CM | POA: Diagnosis present

## 2012-07-28 DIAGNOSIS — J449 Chronic obstructive pulmonary disease, unspecified: Secondary | ICD-10-CM

## 2012-07-28 DIAGNOSIS — G609 Hereditary and idiopathic neuropathy, unspecified: Secondary | ICD-10-CM

## 2012-07-28 LAB — GLUCOSE, CAPILLARY: Glucose-Capillary: 96 mg/dL (ref 70–99)

## 2012-07-28 LAB — BASIC METABOLIC PANEL
BUN: 33 mg/dL — ABNORMAL HIGH (ref 6–23)
BUN: 41 mg/dL — ABNORMAL HIGH (ref 6–23)
CO2: 26 mEq/L (ref 19–32)
CO2: 23 mEq/L (ref 19–32)
Calcium: 9.2 mg/dL (ref 8.4–10.5)
Calcium: 8.9 mg/dL (ref 8.4–10.5)
Chloride: 94 mEq/L — ABNORMAL LOW (ref 96–112)
Creatinine, Ser: 4.29 mg/dL — ABNORMAL HIGH (ref 0.50–1.10)
Creatinine, Ser: 5.03 mg/dL — ABNORMAL HIGH (ref 0.50–1.10)
GFR calc Af Amer: 9 mL/min — ABNORMAL LOW (ref >90)
GFR calc non Af Amer: 8 mL/min — ABNORMAL LOW (ref >90)
Glucose, Bld: 149 mg/dL — ABNORMAL HIGH (ref 70–99)
Glucose, Bld: 117 mg/dL — ABNORMAL HIGH (ref 70–99)
Potassium: 4 mEq/L (ref 3.5–5.1)
Sodium: 132 mEq/L — ABNORMAL LOW (ref 135–145)

## 2012-07-28 LAB — CBC WITH DIFFERENTIAL/PLATELET
Eosinophils Absolute: 0.1 10*3/uL (ref 0.0–0.7)
Eosinophils Relative: 1 % (ref 0–5)
HCT: 34.3 % — ABNORMAL LOW (ref 36.0–46.0)
Hemoglobin: 11.4 g/dL — ABNORMAL LOW (ref 12.0–15.0)
Lymphs Abs: 1 10*3/uL (ref 0.7–4.0)
MCH: 31 pg (ref 26.0–34.0)
MCV: 93.2 fL (ref 78.0–100.0)
Monocytes Absolute: 0.6 10*3/uL (ref 0.1–1.0)
Monocytes Relative: 5 % (ref 3–12)
Platelets: 229 10*3/uL (ref 150–400)
RBC: 3.68 MIL/uL — ABNORMAL LOW (ref 3.87–5.11)

## 2012-07-28 LAB — CBC
MCH: 31.4 pg (ref 26.0–34.0)
MCHC: 33.4 g/dL (ref 30.0–36.0)
MCV: 93.8 fL (ref 78.0–100.0)
Platelets: 191 10*3/uL (ref 150–400)
RDW: 14.4 % (ref 11.5–15.5)
WBC: 9.9 10*3/uL (ref 4.0–10.5)

## 2012-07-28 LAB — COMPREHENSIVE METABOLIC PANEL
AST: 51 U/L — ABNORMAL HIGH (ref 0–37)
Albumin: 3.6 g/dL (ref 3.5–5.2)
Chloride: 94 mEq/L — ABNORMAL LOW (ref 96–112)
Creatinine, Ser: 4.2 mg/dL — ABNORMAL HIGH (ref 0.50–1.10)
Potassium: 3.2 mEq/L — ABNORMAL LOW (ref 3.5–5.1)
Total Bilirubin: 0.5 mg/dL (ref 0.3–1.2)

## 2012-07-28 LAB — MRSA PCR SCREENING: MRSA by PCR: NEGATIVE

## 2012-07-28 MED ORDER — VANCOMYCIN HCL IN DEXTROSE 1-5 GM/200ML-% IV SOLN
1000.0000 mg | Freq: Once | INTRAVENOUS | Status: DC
  Filled 2012-07-28: qty 200

## 2012-07-28 MED ORDER — CEFEPIME HCL 1 G IJ SOLR
1.0000 g | Freq: Once | INTRAMUSCULAR | Status: DC
  Filled 2012-07-28: qty 1

## 2012-07-28 MED ORDER — ALTEPLASE 2 MG IJ SOLR
2.0000 mg | Freq: Once | INTRAMUSCULAR | Status: AC | PRN
  Filled 2012-07-28: qty 2

## 2012-07-28 MED ORDER — NEPRO/CARBSTEADY PO LIQD: 237.0000 mL | ORAL | Status: DC | PRN

## 2012-07-28 MED ORDER — SODIUM CHLORIDE 0.9 % IV SOLN: 100.0000 mL | INTRAVENOUS | Status: DC | PRN

## 2012-07-28 MED ORDER — HEPARIN SODIUM (PORCINE) 1000 UNIT/ML DIALYSIS
20.0000 [IU]/kg | INTRAMUSCULAR | Status: DC | PRN
  Administered 2012-07-28: 1500 [IU] via INTRAVENOUS_CENTRAL
  Filled 2012-07-28: qty 2

## 2012-07-28 MED ORDER — DOXERCALCIFEROL 4 MCG/2ML IV SOLN
6.0000 ug | INTRAVENOUS | Status: DC
  Administered 2012-07-31: 6 ug via INTRAVENOUS
  Filled 2012-07-28 (×2): qty 4

## 2012-07-28 MED ORDER — VANCOMYCIN HCL 10 G IV SOLR
1750.0000 mg | Freq: Once | INTRAVENOUS | Status: AC
  Administered 2012-07-28: 1750 mg via INTRAVENOUS
  Filled 2012-07-28: qty 1750

## 2012-07-28 MED ORDER — LIDOCAINE HCL (PF) 1 % IJ SOLN: 5.0000 mL | INTRAMUSCULAR | Status: DC | PRN

## 2012-07-28 MED ORDER — CEFEPIME HCL 2 G IJ SOLR
2.0000 g | INTRAMUSCULAR | Status: DC
  Filled 2012-07-28: qty 2

## 2012-07-28 MED ORDER — HEPARIN SODIUM (PORCINE) 1000 UNIT/ML DIALYSIS
1000.0000 [IU] | INTRAMUSCULAR | Status: DC | PRN
  Filled 2012-07-28: qty 1

## 2012-07-28 MED ORDER — DARBEPOETIN ALFA-POLYSORBATE 25 MCG/0.42ML IJ SOLN
6.2500 ug | INTRAMUSCULAR | Status: DC
  Filled 2012-07-28: qty 0.42

## 2012-07-28 MED ORDER — DOXERCALCIFEROL 4 MCG/2ML IV SOLN
INTRAVENOUS | Status: AC
  Administered 2012-07-28: 6 ug via INTRAVENOUS
  Filled 2012-07-28: qty 4

## 2012-07-28 MED ORDER — VANCOMYCIN HCL IN DEXTROSE 750-5 MG/150ML-% IV SOLN
750.0000 mg | INTRAVENOUS | Status: DC
  Administered 2012-07-28: 750 mg via INTRAVENOUS
  Filled 2012-07-28: qty 150

## 2012-07-28 MED ORDER — RENA-VITE PO TABS
1.0000 | ORAL_TABLET | Freq: Every day | ORAL | Status: DC
  Administered 2012-07-28 – 2012-07-31 (×4): 1 via ORAL
  Filled 2012-07-28 (×4): qty 1

## 2012-07-28 MED ORDER — POTASSIUM CHLORIDE CRYS ER 20 MEQ PO TBCR
40.0000 meq | EXTENDED_RELEASE_TABLET | Freq: Once | ORAL | Status: AC
  Administered 2012-07-28: 40 meq via ORAL
  Filled 2012-07-28: qty 2

## 2012-07-28 MED ORDER — DEXTROSE 5 % IV SOLN
2.0000 g | Freq: Once | INTRAVENOUS | Status: AC
  Administered 2012-07-28: 2 g via INTRAVENOUS
  Filled 2012-07-28: qty 2

## 2012-07-28 MED ORDER — PENTAFLUOROPROP-TETRAFLUOROETH EX AERO: 1.0000 "application " | INHALATION_SPRAY | CUTANEOUS | Status: DC | PRN

## 2012-07-28 MED ORDER — DARBEPOETIN ALFA-POLYSORBATE 25 MCG/0.42ML IJ SOLN
INTRAMUSCULAR | Status: AC
  Administered 2012-07-28: 6.5476 ug via INTRAVENOUS
  Filled 2012-07-28: qty 0.42

## 2012-07-28 MED ORDER — LIDOCAINE-PRILOCAINE 2.5-2.5 % EX CREA
1.0000 "application " | TOPICAL_CREAM | CUTANEOUS | Status: DC | PRN
  Filled 2012-07-28: qty 5

## 2012-07-28 NOTE — Progress Notes (Signed)
ANTIBIOTIC CONSULT NOTE - INITIAL  Pharmacy Consult for vancomycin and cefepime Indication: rule out pneumonia  Allergies  Allergen Reactions  . Ciprocin-Fluocin-Procin (Fluocinolone Acetonide) Itching  . Dilaudid (Hydromorphone Hcl) Itching  . Morphine Sulfate Itching  . Opium Itching    Patient Measurements: Height: 5\' 6"  (167.6 cm) Weight: 167 lb 8.8 oz (76 kg) IBW/kg (Calculated) : 59.3  Vital Signs: Temp: 102.1 F (38.9 C) (06/20 2323) Temp src: Oral (06/20 2323) BP: 198/64 mmHg (06/21 0000) Pulse Rate: 86 (06/21 0000)  Labs:  Recent Labs  07/27/12 2309  WBC 12.5*  HGB 11.4*  PLT 229  CREATININE 4.20*   Estimated Creatinine Clearance: 13 ml/min (by C-G formula based on Cr of 4.2).   Microbiology: Recent Results (from the past 720 hour(s))  MRSA PCR SCREENING     Status: None   Collection Time    07/27/12 10:53 PM      Result Value Range Status   MRSA by PCR NEGATIVE  NEGATIVE Final   Comment:            The GeneXpert MRSA Assay (FDA     approved for NASAL specimens     only), is one component of a     comprehensive MRSA colonization     surveillance program. It is not     intended to diagnose MRSA     infection nor to guide or     monitor treatment for     MRSA infections.    Medical History: Past Medical History  Diagnosis Date  . Chronic kidney disease     just found out? Placed graft in NOv-Dr. Orvan Falconer in [facility].  started dialysis in 06/2011  . Arthritis     Osteoarthritis  . Neuropathy in diabetes   . Insomnia   . Asthma   . CAD (coronary artery disease) 2012    7 stents-First PCI was in [place].  The last time she had a procedure was  a stent last year, thena  CABG was undertaken in October [facility])  . GERD (gastroesophageal reflux disease)   . Diabetes mellitus     Type II  . COPD (chronic obstructive pulmonary disease)   . Hypertension   . Peripheral vascular disease     endarterectomy  . Anemia   . CHF  (congestive heart failure)   . Coronary artery dilation   . Myocardial infarction 2013    x 3  . 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 amLODipine (NORVASC) 10 MG tablet Take 10 mg by mouth daily.      Marland Kitchen aspirin 325 MG tablet Take 325 mg by mouth daily.      Marland Kitchen atorvastatin (LIPITOR) 40 MG tablet Take 40 mg by mouth daily.      . clopidogrel (PLAVIX) 75 MG tablet Take 1 tablet (75 mg total) by mouth daily.  30 tablet  0  . insulin aspart (NOVOLOG) 100 UNIT/ML injection Inject 2 Units into the skin 3 (three) times daily with meals.  1 vial  0  . insulin aspart (NOVOLOG) 100 UNIT/ML injection Inject 2-7 Units into the skin 3 (three) times daily before meals. Per sliding scale      . insulin glargine (LANTUS) 100 UNIT/ML injection Inject 38 Units into the skin at bedtime.      . isosorbide mononitrate (IMDUR) 30 MG 24 hr tablet Take  30 mg by mouth daily.      . metolazone (ZAROXOLYN) 2.5 MG tablet Take 2.5 mg by mouth daily as needed.      . metoprolol (LOPRESSOR) 50 MG tablet Take 50 mg by mouth 2 (two) times daily.      Marland Kitchen oxyCODONE-acetaminophen (PERCOCET/ROXICET) 5-325 MG per tablet Take 1 tablet by mouth every 4 (four) hours as needed for pain. pain  60 tablet  0  . pantoprazole (PROTONIX) 40 MG tablet Take 40 mg by mouth daily.      . promethazine (PHENERGAN) 12.5 MG tablet Take 1 tablet (12.5 mg total) by mouth every 6 (six) hours as needed for nausea.  30 tablet  0  . ranolazine (RANEXA) 500 MG 12 hr tablet Take 1,000 mg by mouth 2 (two) times daily.       Scheduled:  . amLODipine  10 mg Oral Daily  . aspirin  325 mg Oral Daily  . atorvastatin  40 mg Oral Daily  . ceFEPime (MAXIPIME) IV  2 g Intravenous Once  . clopidogrel  75 mg Oral Daily  . enoxaparin (LOVENOX) injection  30 mg Subcutaneous Q24H  . insulin aspart  0-9 Units Subcutaneous TID WC  . insulin glargine  38 Units  Subcutaneous QHS  . isosorbide mononitrate  30 mg Oral Daily  . metoprolol  50 mg Oral BID  . pantoprazole  40 mg Oral Daily  . ranolazine  1,000 mg Oral BID  . sodium chloride  3 mL Intravenous Q12H  . sodium chloride  3 mL Intravenous Q12H  . vancomycin  1,750 mg Intravenous Once  . [DISCONTINUED] vancomycin  1,000 mg Intravenous Once      Assessment: 72yo female transferred to [facility], to begin IV ABX for possible PNA.  Goal of Therapy:  Pre-HD vanc level 15-25  Plan:  Will give vancomycin 1750mg  IV x1 followed by 750mg  IV after each HD as well as cefepime 2g now and after every HD and monitor CBC, Cx, levels prn.  Vernard Gambles, PharmD, BCPS  07/28/2012,12:36 AM

## 2012-07-28 NOTE — Progress Notes (Addendum)
[facility] Progress Note [place] TEAM 1 - Stepdown/ICU TEAM   Holly Ellison JYN:[id number] DOB: [birth year] DOA: 07/27/2012 PCP: Lucianne Lei, MD  Brief narrative: Holly Ellison is a 72 y.o.AA female with ESRD on HD since April 2013 , CAD s/p CABG, DM, HTN on TTS dialysis in [place] who presented to the [facility] ED with left sided CP. She had just been seen by the PA during her dialysis treatment 6/19 and had no c/o at that time. She was noted to be afebrile pre and post HD. At [facility], she was found to be febrile. CXR showed. LLL opacity. EKG and troponins were unremarkable per H and P. She was transferred to [facility] for further treatment due to the need for hemodialysis. She has not felt well on an off over the past week, but nonproductive cough and CP started after dialysis. She has chronic headaches and dizziness , the latter is not always associated with positional changes and at times passes out when standing and has no sensation of warning. Receiving PT at home due to LE weakness/neuropathy. Has PCP in [place] who is following these issues. Had recent cataract surgery that she said did not significantly improve her vision (it was hoped this would help her dizziness).  She generally has no problems with her HD treatments.   Assessment/Plan: Principal Problem: Sepsis - likely due to bacteremia- gr pos cocci in clusters - cont Vanc - d/c Cefepime - doubt Pneumonia- pt c/o a dry cough but is not hypoxic, no crackles on exam, CXR clear of infiltrates  Active Problems:   Diabetes mellitus -Lantus and sliding scale    Hypertension -cont Norvasc    COPD (chronic obstructive pulmonary disease) -stable    ESRD on dialysis -nephro consulted    Code Status: Full  Family Communication: none Disposition Plan: follow in SDU  Consultants: Nephro  Procedures: none  Antibiotics: Vanc/Cefepime-07/27/12  DVT prophylaxis: SCDs  HPI/Subjective: Dry cough, no further  fevers   Objective: Blood pressure 101/53, pulse 71, temperature 99 F (37.2 C), temperature source Oral, resp. rate 16, height 5\' 6"  (1.676 m), weight 73.5 kg (162 lb 0.6 oz), SpO2 100.00%.  Intake/Output Summary (Last 24 hours) at 07/28/12 1535 Last data filed at 07/28/12 1200  Gross per 24 hour  Intake   1520 ml  Output    200 ml  Net   1320 ml     Exam: General: No acute respiratory distress Lungs: Clear to auscultation bilaterally without wheezes or crackles Cardiovascular: Regular rate and rhythm without murmur gallop or rub normal S1 and S2 Abdomen: Nontender, nondistended, soft, bowel sounds positive, no rebound, no ascites, no appreciable mass Extremities: No significant cyanosis, clubbing, or edema bilateral lower extremities  Data Reviewed: Basic Metabolic Panel:  Recent Labs Lab 07/27/12 2309 07/28/12 0415 07/28/12 1435  NA 135 135 132*  K 3.2* 3.0* 4.0  CL 94* 97 94*  CO2 29 26 23   GLUCOSE 168* 149* 117*  BUN 29* 33* 41*  CREATININE 4.20* 4.29* 5.03*  CALCIUM 9.8 9.2 8.9   Liver Function Tests:  Recent Labs Lab 07/27/12 2309  AST 51*  ALT 37*  ALKPHOS 110  BILITOT 0.5  PROT 7.6  ALBUMIN 3.6   No results found for this basename: LIPASE, AMYLASE,  in the last 168 hours No results found for this basename: AMMONIA,  in the last 168 hours CBC:  Recent Labs Lab 07/27/12 2309 07/28/12 0415  WBC 12.5* 9.9  NEUTROABS 10.9*  --   HGB  11.4* 10.6*  HCT 34.3* 31.7*  MCV 93.2 93.8  PLT 229 191   Cardiac Enzymes:  Recent Labs Lab 07/27/12 2310  TROPONINI <0.30   BNP (last 3 results) No results found for this basename: PROBNP,  in the last 8760 hours CBG:  Recent Labs Lab 07/28/12 0812 07/28/12 1246  GLUCAP 96 86    Recent Results (from the past 240 hour(s))  MRSA PCR SCREENING     Status: None   Collection Time    07/27/12 10:53 PM      Result Value Range Status   MRSA by PCR NEGATIVE  NEGATIVE Final   Comment:            The  GeneXpert MRSA Assay (FDA     approved for NASAL specimens     only), is one component of a     comprehensive MRSA colonization     surveillance program. It is not     intended to diagnose MRSA     infection nor to guide or     monitor treatment for     MRSA infections.     Studies:  Recent x-ray studies have been reviewed in detail by the Attending Physician  Scheduled Meds:  Scheduled Meds: . amLODipine  10 mg Oral Daily  . aspirin  325 mg Oral Daily  . atorvastatin  40 mg Oral Daily  . ceFEPime (MAXIPIME) IV  2 g Intravenous Q T,Th,Sa-HD  . clopidogrel  75 mg Oral Daily  . darbepoetin (ARANESP) injection - DIALYSIS  6.5476 mcg Intravenous Q Sat-HD  . doxercalciferol  6 mcg Intravenous Q T,Th,Sa-HD  . insulin aspart  0-9 Units Subcutaneous TID WC  . insulin glargine  38 Units Subcutaneous QHS  . isosorbide mononitrate  30 mg Oral Daily  . metoprolol  50 mg Oral BID  . multivitamin  1 tablet Oral Daily  . pantoprazole  40 mg Oral Daily  . ranolazine  1,000 mg Oral BID  . sodium chloride  3 mL Intravenous Q12H  . sodium chloride  3 mL Intravenous Q12H  . vancomycin  750 mg Intravenous Q T,Th,Sa-HD   Continuous Infusions:   Time spent on care of this patient: 35 min   Calvert Cantor, MD [phone]  [facility] Office  [phone] Pager - Text Page per Amion as per below:  On-Call/Text Page:      [email or web address]      password [email or web address]  If 7PM-7AM, please contact night-coverage [email or web address] Password [email or web address] 07/28/2012, 3:35 PM   LOS: 1 day

## 2012-07-28 NOTE — Consult Note (Signed)
[facility] Renal Consultation Note    Indication for Consultation:  Management of ESRD/hemodialysis; anemia, hypertension/volume and secondary hyperparathyroidism Requesting Physician:    HPI: Holly Ellison is a 72 y.o.AA  female with ESRD on HD since April 2013 , CAD s/p CABG, DM, HTN on TTS dialysis in [place] who presented to the [facility] ED with left sided CP. She had just been seen by the PA during her dialysis treatment 6/19 and had no c/o at that time. She was noted to be afebrile pre and post HD.  At [facility], she was found to be febrile. CXR showed. LLL opacity. EKG and troponins were unremarkable per H and P. She was transferred to [facility] for further treatment due to the need for hemodialysis.  She has not felt well on an off over the past week, but nonproductive cough and CP started after dialysis. She has chronic headaches and dizziness , the latter is not always associated with positional changes and at times passes out when standing and has no sensation of warning.  Receiving PT at home due to LE weakness/neuropathy. Has PCP in [place] who is following these issues.  Had recent cataract surgery that she said did not significantly improve her vision (it was hoped this would help her dizziness). She generally has no problems with her HD treatments. Past Medical History  Diagnosis Date  . Chronic kidney disease     just found out? Placed graft in NOv-Dr. Orvan Falconer in [facility]  started dialysis in 06/2011  . Arthritis     Osteoarthritis  . Neuropathy in diabetes   . Insomnia   . Asthma   . CAD (coronary artery disease) 2012    7 stents-First PCI was in [place].  The last time she had a procedure was  a stent last year, thena  CABG was undertaken in [facility])  . GERD (gastroesophageal reflux disease)   . Diabetes mellitus     Type II  . COPD (chronic obstructive pulmonary disease)   . Hypertension   . Peripheral vascular disease      endarterectomy  . Anemia   . CHF (congestive heart failure)   . Coronary artery dilation   . Myocardial infarction 2013    x 3  . 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 Abdominal hysterectomy    . Coronary artery bypass graft  2012    7 stents  . Cea      Left  . Carpal tunnel release    . Av fistula placement    . Grave's disease      ? unclea rif ca or not    Family History  Problem Relation Age of Onset  . Diabetes type II     Social History: Grew up in [place]. Moved to [place] several years ago. Husband got Alzheimers and is in a facility there.  She then moved to [place] to live with daughter.  reports that she quit smoking about 16 years ago. Her smoking use included Cigarettes. She smoked 0.00 packs per day. She has never used smokeless tobacco. She reports that she does not drink alcohol or use illicit drugs. Allergies  Allergen Reactions  . Ciprocin-Fluocin-Procin (Fluocinolone Acetonide) Itching  . Dilaudid (Hydromorphone Hcl) Itching  . Morphine Sulfate Itching  . Opium Itching   Prior to Admission medications   Medication Sig Start Date End Date Taking? Authorizing Provider  amLODipine (NORVASC) 10 MG tablet Take 10 mg by mouth daily.    Historical Provider, MD  aspirin 325 MG tablet Take 325 mg by mouth daily.    Historical Provider, MD  atorvastatin (LIPITOR) 40 MG tablet Take 40 mg by mouth daily.    Historical Provider, MD  clopidogrel (PLAVIX) 75 MG tablet Take 1 tablet (75 mg total) by mouth daily. 11/02/11   Laveda Norman, MD  insulin aspart (NOVOLOG) 100 UNIT/ML injection Inject 2 Units into the skin 3 (three) times daily with meals. 06/09/11 06/08/12  Rhetta Mura, MD  insulin aspart (NOVOLOG) 100 UNIT/ML injection Inject 2-7 Units into the skin 3 (three) times daily before meals. Per sliding scale    Historical Provider, MD  insulin glargine (LANTUS) 100 UNIT/ML injection Inject 38 Units into  the skin at bedtime.    Historical Provider, MD  isosorbide mononitrate (IMDUR) 30 MG 24 hr tablet Take 30 mg by mouth daily.    Historical Provider, MD  metolazone (ZAROXOLYN) 2.5 MG tablet Take 2.5 mg by mouth daily as needed.    Historical Provider, MD  metoprolol (LOPRESSOR) 50 MG tablet Take 50 mg by mouth 2 (two) times daily.    Historical Provider, MD  oxyCODONE-acetaminophen (PERCOCET/ROXICET) 5-325 MG per tablet Take 1 tablet by mouth every 4 (four) hours as needed for pain. pain 11/02/11   Laveda Norman, MD  pantoprazole (PROTONIX) 40 MG tablet Take 40 mg by mouth daily.    Historical Provider, MD  promethazine (PHENERGAN) 12.5 MG tablet Take 1 tablet (12.5 mg total) by mouth every 6 (six) hours as needed for nausea. 06/09/11 06/16/11  Rhetta Mura, MD  ranolazine (RANEXA) 500 MG 12 hr tablet Take 1,000 mg by mouth 2 (two) times daily.    Historical Provider, MD   Current Facility-Administered Medications  Medication Dose Route Frequency Provider Last Rate Last Dose  . acetaminophen (TYLENOL) tablet 650 mg  650 mg Oral Q6H PRN Eduard Clos, MD   650 mg at 07/28/12 0004   Or  . acetaminophen (TYLENOL) suppository 650 mg  650 mg Rectal Q6H PRN Eduard Clos, MD      . amLODipine (NORVASC) tablet 10 mg  10 mg Oral Daily Eduard Clos, MD   10 mg at 07/28/12 1002  . aspirin tablet 325 mg  325 mg Oral Daily Eduard Clos, MD   325 mg at 07/28/12 1004  . atorvastatin (LIPITOR) tablet 40 mg  40 mg Oral Daily Eduard Clos, MD   40 mg at 07/28/12 1002  . clopidogrel (PLAVIX) tablet 75 mg  75 mg Oral Daily Eduard Clos, MD   75 mg at 07/28/12 1004  . darbepoetin (ARANESP) injection 6.5476 mcg  6.5476 mcg Intravenous Q Sat-HD Sheffield Slider, PA-C      . doxercalciferol (HECTOROL) injection 6 mcg  6 mcg Intravenous Q T,Th,Sa-HD Sheffield Slider, PA-C      . insulin aspart (novoLOG) injection 0-9 Units  0-9 Units Subcutaneous TID WC Eduard Clos, MD       . insulin glargine (LANTUS) injection 38 Units  38 Units Subcutaneous QHS Eduard Clos, MD   38 Units at 07/28/12 0034  . isosorbide mononitrate (IMDUR) 24 hr tablet 30 mg  30 mg Oral Daily Eduard Clos, MD   30 mg at 07/28/12 1004  . metoprolol (LOPRESSOR) tablet 50 mg  50 mg Oral BID Eduard Clos, MD   50 mg at 07/28/12 1004  .  multivitamin (RENA-VIT) tablet 1 tablet  1 tablet Oral Daily Sheffield Slider, PA-C      . ondansetron [facility]) tablet 4 mg  4 mg Oral Q6H PRN Eduard Clos, MD       Or  . ondansetron (ZOFRAN) injection 4 mg  4 mg Intravenous Q6H PRN Eduard Clos, MD      . oxyCODONE-acetaminophen (PERCOCET/ROXICET) 5-325 MG per tablet 1 tablet  1 tablet Oral Q4H PRN Eduard Clos, MD      . pantoprazole (PROTONIX) EC tablet 40 mg  40 mg Oral Daily Eduard Clos, MD   40 mg at 07/28/12 1002  . promethazine (PHENERGAN) tablet 12.5 mg  12.5 mg Oral Q6H PRN Eduard Clos, MD      . ranolazine (RANEXA) 12 hr tablet 1,000 mg  1,000 mg Oral BID Eduard Clos, MD   1,000 mg at 07/28/12 1010  . sodium chloride 0.9 % injection 3 mL  3 mL Intravenous Q12H Eduard Clos, MD   3 mL at 07/28/12 0041  . sodium chloride 0.9 % injection 3 mL  3 mL Intravenous Q12H Eduard Clos, MD   3 mL at 07/28/12 0040   Labs: Basic Metabolic Panel:  Recent Labs Lab 07/27/12 2309 07/28/12 0415  NA 135 135  K 3.2* 3.0*  CL 94* 97  CO2 29 26  GLUCOSE 168* 149*  BUN 29* 33*  CREATININE 4.20* 4.29*  CALCIUM 9.8 9.2   Liver Function Tests:  Recent Labs Lab 07/27/12 2309  AST 51*  ALT 37*  ALKPHOS 110  BILITOT 0.5  PROT 7.6  ALBUMIN 3.6  CBC:  Recent Labs Lab 07/27/12 2309 07/28/12 0415  WBC 12.5* 9.9  NEUTROABS 10.9*  --   HGB 11.4* 10.6*  HCT 34.3* 31.7*  MCV 93.2 93.8  PLT 229 191   Cardiac Enzymes:  Recent Labs Lab 07/27/12 2310  TROPONINI <0.30   CBG:  Recent Labs Lab 07/28/12 0812  GLUCAP 96    ROS: Prone to constipation. Makes a little urine, otherwise negative except as per HPI  Physical Exam: Filed Vitals:   07/28/12 0000 07/28/12 0030 07/28/12 0200 07/28/12 0439  BP: 198/64 207/61 164/59 158/53  Pulse: 86 88 79 62  Temp:    99.2 F (37.3 C)  TempSrc:    Oral  Resp: 20 17 24 17   Height:      Weight:      SpO2: 100% 100% 100% 100%     General: Well developed, well nourished, in no acute distress- on room air Head: Normocephalic, atraumatic, sclera non-icteric, mucus membranes are moist, wearing glasses Neck: Supple. JVD not elevated. Lungs: crackles right base. Breathing is unlabored. Heart: RRR with S1 S2. No murmurs, rubs, or gallops appreciated. Abdomen: Soft, non-tender, non-distended with normoactive bowel sounds. No rebound/guarding. No obvious abdominal masses. M-S:  Strength and tone appear normal for age. Lower extremities: without edema or ischemic changes, no open wounds  Neuro: Alert and oriented X 3. Moves all extremities spontaneously. Psych:  Responds to questions appropriately with a normal affect. Dialysis Access:left upper AVF + bruit  Dialysis Orders: Center: TTS 4 hr Optiflux 180 400/600 2 K 2.25 Ca EDW 75 left upper AVF Epo 1200 (just decreased from 1400), no Fe, Hectorol 6, heparin 2100; Recent labs:  Hgb 10.7 6/19, 33% sat 5/29 and ferritin 1500 4/24  Assessment/Plan:  1. HCAP - BC drawn at [facility]; empiric Vanc and cefepime; WBC decreased 12.5 to 9.9  with the initiation of antibioitcs. Going for CXR pre HD today 2. ESRD -  TTS - usual orders; HD today; K 3 - will need 4 K bath today; was given 40 KCL at 11 am today; recheck K pre HD -  3. Hypertension/volume  - on norvasc 10 and metoprolol 50 bid plus volume control; BP pre and post HD ~140-150s; BP on tele now 124/48 P 60s; will check orthostatics pre and post HD because given her hx of dizziness want to avoid orthostasis.   4. Anemia  - Hgb 10.6 - will give 6.25 Aranesp -  no Fe 5. Metabolic bone disease -  Continue hectorol; tells me she is not on binders 6.   Nutrition - renal diet + multivit 7.   CAD  - on BB, plavix, imdur and asa, ranexa and statin 8.   Slight ^ LFTs - decrease volume and trend, could be related to liver congestion 9.   DM - Lantus + SSI  Sheffield Slider, PA-C [facility] [phone] 07/28/2012, 12:46 PM   Patient seen and examined.  Agree with assessment and plan as above. Vinson Moselle  MD [phone] pgr    [phone] cell 07/28/2012, 12:59 PM

## 2012-07-28 NOTE — Progress Notes (Signed)
CRITICAL VALUE ALERT  Critical value received:  Gram Pos Cocci in Clusters, both bottles  Date of notification:  07/28/2012  Time of notification:  0819  Critical value read back:yes  Nurse who received alert:  Leonia Reeves  MD notified (1st page):  Dr. Butler Denmark  Time of first page:  0915  MD notified (2nd page):  Time of second page:  Responding MD:  Dr. Butler Denmark  Time MD responded:  [phone]

## 2012-07-28 NOTE — Procedures (Signed)
I was present at this dialysis session. I have reviewed the session itself and made appropriate changes.   Rob Toure Edmonds, MD [facility] 07/28/2012, 5:39 PM   

## 2012-07-29 LAB — BASIC METABOLIC PANEL
CO2: 30 mEq/L (ref 19–32)
CO2: 31 mEq/L (ref 19–32)
Calcium: 9.1 mg/dL (ref 8.4–10.5)
Calcium: 9.4 mg/dL (ref 8.4–10.5)
Creatinine, Ser: 3.34 mg/dL — ABNORMAL HIGH (ref 0.50–1.10)
GFR calc Af Amer: 14 mL/min — ABNORMAL LOW (ref >90)
GFR calc non Af Amer: 13 mL/min — ABNORMAL LOW (ref >90)
GFR calc non Af Amer: 12 mL/min — ABNORMAL LOW (ref >90)
Glucose, Bld: 51 mg/dL — ABNORMAL LOW (ref 70–99)
Sodium: 135 mEq/L (ref 135–145)
Sodium: 136 mEq/L (ref 135–145)

## 2012-07-29 LAB — GLUCOSE, CAPILLARY
Glucose-Capillary: 58 mg/dL — ABNORMAL LOW (ref 70–99)
Glucose-Capillary: 75 mg/dL (ref 70–99)
Glucose-Capillary: 137 mg/dL — ABNORMAL HIGH (ref 70–99)

## 2012-07-29 LAB — CBC
Hemoglobin: 10.5 g/dL — ABNORMAL LOW (ref 12.0–15.0)
MCH: 30.9 pg (ref 26.0–34.0)
MCHC: 32.8 g/dL (ref 30.0–36.0)
MCV: 94.1 fL (ref 78.0–100.0)
Platelets: 197 10*3/uL (ref 150–400)

## 2012-07-29 LAB — URINE CULTURE

## 2012-07-29 MED ORDER — POTASSIUM CHLORIDE CRYS ER 20 MEQ PO TBCR
20.0000 meq | EXTENDED_RELEASE_TABLET | Freq: Once | ORAL | Status: AC
  Administered 2012-07-29: 20 meq via ORAL
  Filled 2012-07-29: qty 1

## 2012-07-29 MED ORDER — POTASSIUM CHLORIDE CRYS ER 20 MEQ PO TBCR: 40.0000 meq | EXTENDED_RELEASE_TABLET | Freq: Once | ORAL | Status: DC

## 2012-07-29 MED ORDER — INSULIN GLARGINE 100 UNIT/ML ~~LOC~~ SOLN
30.0000 [IU] | Freq: Every day | SUBCUTANEOUS | Status: DC
  Administered 2012-07-29: 30 [IU] via SUBCUTANEOUS
  Filled 2012-07-29 (×2): qty 0.3

## 2012-07-29 NOTE — Progress Notes (Addendum)
[facility] Progress Note [place] TEAM 1 - Stepdown/ICU TEAM   Darika Ildefonso ZOX:[id number] DOB: [birth year] DOA: 07/27/2012 PCP: Lucianne Lei, MD  Brief narrative: Holly Ellison is a 72 y.o.AA female with ESRD on HD since April 2013 , CAD s/p CABG, DM, HTN on TTS dialysis in [place] who presented to the [facility] ED with left sided CP. She had just been seen by the PA during her dialysis treatment 6/19 and had no c/o at that time. She was noted to be afebrile pre and post HD. At [facility], she was found to be febrile. CXR showed. LLL opacity. EKG and troponins were unremarkable per H and P. She was transferred to [facility] for further treatment due to the need for hemodialysis. She has not felt well on an off over the past week, but nonproductive cough and CP started after dialysis. She has chronic headaches and dizziness , the latter is not always associated with positional changes and at times passes out when standing and has no sensation of warning. Receiving PT at home due to LE weakness/neuropathy. Has PCP in  who is following these issues. Had recent cataract surgery that she said did not significantly improve her vision (it was hoped this would help her dizziness).  She generally has no problems with her HD treatments.   Assessment/Plan: Principal Problem: Sepsis - likely due to bacteremia- gr pos cocci in clusters - cont Vanc - d/c Cefepime - doubt Pneumonia- pt c/o a dry cough but is not hypoxic, no crackles on exam, CXR reveals that previous infiltrate has cleared quickly and therefore it was likely atelectasis.   Active Problems:   Diabetes mellitus -Lantus and sliding scale - hypoglycemic this AM - admits to not eating well last night- i suspect she eats more carbs at home than what she is receiving here- will decease lantus for tonight.     Hypertension -cont Norvasc    COPD (chronic obstructive pulmonary disease) -stable    ESRD on dialysis -nephro  consulted  Hypokalemia Replace today    Code Status: Full  Family Communication: none Disposition Plan: transfer to neprho floor- d/c once cultures results finalized.   Consultants: Nephro  Procedures: none  Antibiotics: Vanc/Cefepime-07/27/12  DVT prophylaxis: SCDs  HPI/Subjective: Hypoglycemic this AM- did not eat much last night. No other complaints.    Objective: Blood pressure 140/68, pulse 69, temperature 98.5 F (36.9 C), temperature source Oral, resp. rate 19, height 5\' 6"  (1.676 m), weight 75.9 kg (167 lb 5.3 oz), SpO2 95.00%.  Intake/Output Summary (Last 24 hours) at 07/29/12 1114 Last data filed at 07/29/12 0900  Gross per 24 hour  Intake    820 ml  Output   1850 ml  Net  -1030 ml     Exam: General: No acute respiratory distress Lungs: Clear to auscultation bilaterally without wheezes or crackles Cardiovascular: Regular rate and rhythm without murmur gallop or rub normal S1 and S2 Abdomen: Nontender, nondistended, soft, bowel sounds positive, no rebound, no ascites, no appreciable mass Extremities: No significant cyanosis, clubbing, or edema bilateral lower extremities  Data Reviewed: Basic Metabolic Panel:  Recent Labs Lab 07/27/12 2309 07/28/12 0415 07/28/12 1435 07/29/12 0530  NA 135 135 132* 135  K 3.2* 3.0* 4.0 3.3*  CL 94* 97 94* 96  CO2 29 26 23 30   GLUCOSE 168* 149* 117* 51*  BUN 29* 33* 41* 21  CREATININE 4.20* 4.29* 5.03* 3.34*  CALCIUM 9.8 9.2 8.9 9.1   Liver Function Tests:  Recent Labs  Lab 07/27/12 2309  AST 51*  ALT 37*  ALKPHOS 110  BILITOT 0.5  PROT 7.6  ALBUMIN 3.6   No results found for this basename: LIPASE, AMYLASE,  in the last 168 hours No results found for this basename: AMMONIA,  in the last 168 hours CBC:  Recent Labs Lab 07/27/12 2309 07/28/12 0415 07/29/12 0530  WBC 12.5* 9.9 7.8  NEUTROABS 10.9*  --   --   HGB 11.4* 10.6* 10.5*  HCT 34.3* 31.7* 32.0*  MCV 93.2 93.8 94.1  PLT 229 191 197    Cardiac Enzymes:  Recent Labs Lab 07/27/12 2310  TROPONINI <0.30   BNP (last 3 results) No results found for this basename: PROBNP,  in the last 8760 hours CBG:  Recent Labs Lab 07/28/12 1246 07/28/12 1826 07/28/12 2111 07/29/12 0738 07/29/12 0815  GLUCAP 86 97 110* 58* 75    Recent Results (from the past 240 hour(s))  MRSA PCR SCREENING     Status: None   Collection Time    07/27/12 10:53 PM      Result Value Range Status   MRSA by PCR NEGATIVE  NEGATIVE Final   Comment:            The GeneXpert MRSA Assay (FDA     approved for NASAL specimens     only), is one component of a     comprehensive MRSA colonization     surveillance program. It is not     intended to diagnose MRSA     infection nor to guide or     monitor treatment for     MRSA infections.     Studies:  Recent x-ray studies have been reviewed in detail by the Attending Physician  Scheduled Meds:  Scheduled Meds: . amLODipine  10 mg Oral Daily  . aspirin  325 mg Oral Daily  . atorvastatin  40 mg Oral Daily  . clopidogrel  75 mg Oral Daily  . darbepoetin (ARANESP) injection - DIALYSIS  6.5476 mcg Intravenous Q Sat-HD  . doxercalciferol  6 mcg Intravenous Q T,Th,Sa-HD  . insulin aspart  0-9 Units Subcutaneous TID WC  . insulin glargine  38 Units Subcutaneous QHS  . isosorbide mononitrate  30 mg Oral Daily  . metoprolol  50 mg Oral BID  . multivitamin  1 tablet Oral Daily  . pantoprazole  40 mg Oral Daily  . ranolazine  1,000 mg Oral BID  . sodium chloride  3 mL Intravenous Q12H  . sodium chloride  3 mL Intravenous Q12H  . vancomycin  750 mg Intravenous Q T,Th,Sa-HD   Continuous Infusions:   Time spent on care of this patient: 25 min   Calvert Cantor, MD [phone]  [facility] Office  [phone] Pager - Text Page per Amion as per below:  On-Call/Text Page:      [email or web address]      password [email or web address]  If 7PM-7AM, please contact night-coverage [email or web address] Password  [email or web address] 07/29/2012, 11:14 AM   LOS: 2 days

## 2012-07-29 NOTE — Progress Notes (Signed)
Subjective: Feels bad still, sweats and chills  Objective Filed Vitals:   07/28/12 2143 07/28/12 2200 07/29/12 0000 07/29/12 0346  BP: 141/53 139/59 147/60 139/55  Pulse: 73 65 66 65  Temp:   98.7 F (37.1 C) 98.5 F (36.9 C)  TempSrc:   Oral Oral  Resp: 16 18 21 16   Height:      Weight:    75.9 kg (167 lb 5.3 oz)  SpO2: 95% 93% 96% 93%    CBC:  Recent Labs Lab 07/27/12 2309 07/28/12 0415  WBC 12.5* 9.9  NEUTROABS 10.9*  --   HGB 11.4* 10.6*  HCT 34.3* 31.7*  MCV 93.2 93.8  PLT 229 191   Basic Metabolic Panel:  Recent Labs Lab 07/27/12 2309 07/28/12 0415 07/28/12 1435  NA 135 135 132*  K 3.2* 3.0* 4.0  CL 94* 97 94*  CO2 29 26 23   GLUCOSE 168* 149* 117*  BUN 29* 33* 41*  CREATININE 4.20* 4.29* 5.03*  CALCIUM 9.8 9.2 8.9    Physical Exam:  Blood pressure 139/55, pulse 65, temperature 98.5 F (36.9 C), temperature source Oral, resp. rate 16, height 5\' 6"  (1.676 m), weight 75.9 kg (167 lb 5.3 oz), SpO2 93.00%.  Gen: alert, skin clammy, no distress, not coughing Neck: Supple. JVD not elevated.  Lungs: crackles L base Heart: RRR with S1 S2 Abdomen: Soft, non-tender Ext: no edema or gangrene Neuro: Ox3, nonfocal exam Dialysis Access:left upper AVF + bruit   Dialysis Orders: Center:[place] TTS    4hrs  F180    400/600      2K/2.25Ca Bath    EDW 75kg     LUA AVF  Epo 1200 (just decreased from 1400)     Venofer none     Hectorol 6ug     Heparin 2100 Recent labs: Hgb 10.7 6/19, 33% sat 5/29 and ferritin 1500 4/24   Assessment/Plan:  1. HCAP - improving clinically, LLL, improved by f/u CXR 6/21.  Abx per primary. Fever coming down 2. ESRD - cont TTS HD 3. Hypertension/volume - on norvasc 10 and metoprolol 50 bid here as at home. At dry wt, euvolemic, BP normal.   4. Anemia - Hgb 10.6 - will give 6.25 Aranesp - no Fe 5. Metabolic bone disease - Continue hectorol; says she is not on binder 6. Nutrition - renal diet + multivit  7. CAD - on BB, plavix, imdur and  asa, ranexa and statin  8. Slight ^ LFTs - decrease volume and trend, could be related to liver congestion  9. DM - Lantus + SSI  Vinson Moselle  MD [phone] pgr    [phone] cell 07/29/2012, 6:18 AM

## 2012-07-30 DIAGNOSIS — R7881 Bacteremia: Secondary | ICD-10-CM

## 2012-07-30 DIAGNOSIS — A4901 Methicillin susceptible Staphylococcus aureus infection, unspecified site: Secondary | ICD-10-CM

## 2012-07-30 LAB — GLUCOSE, CAPILLARY
Glucose-Capillary: 53 mg/dL — ABNORMAL LOW (ref 70–99)
Glucose-Capillary: 81 mg/dL (ref 70–99)
Glucose-Capillary: 116 mg/dL — ABNORMAL HIGH (ref 70–99)

## 2012-07-30 MED ORDER — ZOLPIDEM TARTRATE 5 MG PO TABS
5.0000 mg | ORAL_TABLET | Freq: Once | ORAL | Status: AC
  Administered 2012-07-30: 5 mg via ORAL
  Filled 2012-07-30: qty 1

## 2012-07-30 MED ORDER — ASPIRIN EC 81 MG PO TBEC
81.0000 mg | DELAYED_RELEASE_TABLET | Freq: Every day | ORAL | Status: DC
  Administered 2012-07-30 – 2012-07-31 (×2): 81 mg via ORAL
  Filled 2012-07-30 (×2): qty 1

## 2012-07-30 MED ORDER — CEFAZOLIN SODIUM-DEXTROSE 2-3 GM-% IV SOLR
2.0000 g | INTRAVENOUS | Status: DC
  Filled 2012-07-30: qty 50

## 2012-07-30 MED ORDER — DARBEPOETIN ALFA-POLYSORBATE 40 MCG/0.4ML IJ SOLN: 40.0000 ug | INTRAMUSCULAR | Status: DC

## 2012-07-30 MED ORDER — AMLODIPINE BESYLATE 10 MG PO TABS
10.0000 mg | ORAL_TABLET | Freq: Every day | ORAL | Status: DC
  Administered 2012-07-30: 10 mg via ORAL
  Filled 2012-07-30 (×2): qty 1

## 2012-07-30 MED ORDER — RANOLAZINE ER 500 MG PO TB12
500.0000 mg | ORAL_TABLET | Freq: Two times a day (BID) | ORAL | Status: DC
  Administered 2012-07-30 – 2012-07-31 (×2): 500 mg via ORAL
  Filled 2012-07-30 (×3): qty 1

## 2012-07-30 MED ORDER — HEPARIN SODIUM (PORCINE) 5000 UNIT/ML IJ SOLN
5000.0000 [IU] | Freq: Three times a day (TID) | INTRAMUSCULAR | Status: DC
  Administered 2012-07-30 – 2012-07-31 (×3): 5000 [IU] via SUBCUTANEOUS
  Filled 2012-07-30 (×6): qty 1

## 2012-07-30 NOTE — Progress Notes (Signed)
Subjective: " Feeling rough BUT better" Objective Vital signs in last 24 hours: Filed Vitals:   07/29/12 0800 07/29/12 1400 07/29/12 1800 07/30/12 0433  BP: 140/68 152/55 135/53 168/69  Pulse: 69 62 64 72  Temp:  97.9 F (36.6 C) 98.2 F (36.8 C) 98.2 F (36.8 C)  TempSrc:  Oral Oral Oral  Resp: 19 18 20 21   Height:      Weight:      SpO2: 95% 95% 98% 93%   Weight change:   Intake/Output Summary (Last 24 hours) at 07/30/12 0801 Last data filed at 07/29/12 0900  Gross per 24 hour  Intake      0 ml  Output    200 ml  Net   -200 ml   Labs: Basic Metabolic Panel:  Recent Labs Lab 07/28/12 1435 07/29/12 0530 07/29/12 1150  NA 132* 135 136  K 4.0 3.3* 4.1  CL 94* 96 97  CO2 23 30 31   GLUCOSE 117* 51* 80  BUN 41* 21 24*  CREATININE 5.03* 3.34* 3.57*  CALCIUM 8.9 9.1 9.4   Liver Function Tests:  Recent Labs Lab 07/27/12 2309  AST 51*  ALT 37*  ALKPHOS 110  BILITOT 0.5  PROT 7.6  ALBUMIN 3.6   No results found for this basename: LIPASE, AMYLASE,  in the last 168 hours No results found for this basename: AMMONIA,  in the last 168 hours CBC:  Recent Labs Lab 07/27/12 2309 07/28/12 0415 07/29/12 0530  WBC 12.5* 9.9 7.8  NEUTROABS 10.9*  --   --   HGB 11.4* 10.6* 10.5*  HCT 34.3* 31.7* 32.0*  MCV 93.2 93.8 94.1  PLT 229 191 197   Cardiac Enzymes:  Recent Labs Lab 07/27/12 2310  TROPONINI <0.30   CBG:  Recent Labs Lab 07/29/12 0738 07/29/12 0815 07/29/12 1128 07/29/12 1651 07/29/12 2141  GLUCAP 58* 75 137* 121* 113*    Iron Studies: No results found for this basename: IRON, TIBC, TRANSFERRIN, FERRITIN,  in the last 72 hours Studies/Results: Dg Chest 2 View  07/28/2012   *RADIOLOGY REPORT*  Clinical Data: Pneumonia.  CHEST - 2 VIEW  Comparison: Chest x-ray on 07/27/2012 at [facility]  Findings: There is resolution of left lower lung opacity from the portable chest x-ray yesterday consistent with resolved atelectasis.  No residual  findings of atelectasis or infiltrate. The patient has had prior CABG.  Heart is mildly enlarged.  No edema, nodule or pleural fluid is identified.  Bony thorax is unremarkable.  IMPRESSION: No active disease.  Resolution of left basilar density consistent with resolved atelectasis.   Original Report Authenticated By: Irish Lack, M.D.   Medications:   . amLODipine  10 mg Oral Daily  . aspirin  325 mg Oral Daily  . atorvastatin  40 mg Oral Daily  . clopidogrel  75 mg Oral Daily  . darbepoetin (ARANESP) injection - DIALYSIS  6.5476 mcg Intravenous Q Sat-HD  . doxercalciferol  6 mcg Intravenous Q T,Th,Sa-HD  . insulin aspart  0-9 Units Subcutaneous TID WC  . insulin glargine  30 Units Subcutaneous QHS  . isosorbide mononitrate  30 mg Oral Daily  . metoprolol  50 mg Oral BID  . multivitamin  1 tablet Oral Daily  . pantoprazole  40 mg Oral Daily  . ranolazine  1,000 mg Oral BID  . sodium chloride  3 mL Intravenous Q12H  . sodium chloride  3 mL Intravenous Q12H  . vancomycin  750 mg Intravenous Q T,Th,Sa-HD  Physical Exam: General: Alert, nad, appropriate Heart: RRR, 1/6 sem lsb,no rub Lungs:faint Bilat.exp rhonchi Abdomen: bs pos. Soft, nontender Extremities: Dialysis Access: NO Pedal edema/   L U A AVF pos. Bruit   Dialysis Orders: Center:[place] TTS 4hrs F180 400/600 2K/2.25Ca Bath EDW 75kg LUA AVF Epo 1200 (just decreased from 1400) Venofer none/ Hectorol 58mcg/ Heparin 2100  Recent labs: Hgb 10.7 6/19, 33% sat 5/29 and ferritin 1500 4/24   Problem/Plan:  1. Bacteremia= Grm pos. Cocci in clusters= on Vanco .Awaiting sens.  +BC at [facility] ED/afeb. Now/ wu per admit 2. ESRD - cont TTS HD  3. Hypertension/volume -am bp 168/69 on norvasc 10mg  q day change to hs and metoprolol 50 bid . This am .9 kg > edw /attempt 2 kg uf inhd in am  4. Anemia - Hgb 10.5 - will give 6.25 Aranesp - no Fe 5. Metabolic bone disease - Continue hectorol;  not on binder 6. Nutrition - renal diet/  carb mod + multivit  7. CAD - on BB, plavix, imdur and asa, ranexa and statin  8. Slight ^ LFTs -? Related to vol . Fu pre hd levels in am,uf as above. 9. DM - low am bs / per admit team= Lantus + SSI  Lenny Pastel, PA-C [facility] Beeper [phone] 07/30/2012,8:01 AM  LOS: 3 days  I have seen and examined this patient and agree with plan Lenny Pastel. CO feeling weak.  Balance has been poor and PT seeing her at home even prior to admission.  Still with cough but nonproductive.  Change Aranesp to q week.  Await final ID and cont AB.  Plan HD in AM.  Ariyanah Aguado T,MD 07/30/2012 9:49 AM

## 2012-07-30 NOTE — Progress Notes (Signed)
ANTIBIOTIC CONSULT NOTE - FOLLOW UP  Pharmacy Consult for Cefazolin Indication: MSSA Bacteremia  Allergies  Allergen Reactions  . Ciprocin-Fluocin-Procin (Fluocinolone Acetonide) Itching  . Dilaudid (Hydromorphone Hcl) Itching  . Morphine Sulfate Itching  . Opium Itching    Patient Measurements: Height: 5\' 6"  (167.6 cm) Weight: 167 lb 5.3 oz (75.9 kg) IBW/kg (Calculated) : 59.3  Vital Signs: Temp: 98.6 F (37 C) (06/23 0935) Temp src: Oral (06/23 0935) BP: 162/56 mmHg (06/23 0935) Pulse Rate: 68 (06/23 0935) Intake/Output from previous day: 06/22 0701 - 06/23 0700 In: -  Out: 200 [Urine:200] Intake/Output from this shift: Total I/O In: 240 [P.O.:240] Out: -   Labs:  Recent Labs  07/27/12 2309 07/28/12 0415 07/28/12 1435 07/29/12 0530 07/29/12 1150  WBC 12.5* 9.9  --  7.8  --   HGB 11.4* 10.6*  --  10.5*  --   PLT 229 191  --  197  --   CREATININE 4.20* 4.29* 5.03* 3.34* 3.57*   Estimated Creatinine Clearance: 15.3 ml/min (by C-G formula based on Cr of 3.57). No results found for this basename: VANCOTROUGH, Leodis Binet, VANCORANDOM, GENTTROUGH, GENTPEAK, GENTRANDOM, TOBRATROUGH, TOBRAPEAK, TOBRARND, AMIKACINPEAK, AMIKACINTROU, AMIKACIN,  in the last 72 hours   Microbiology: Recent Results (from the past 720 hour(s))  MRSA PCR SCREENING     Status: None   Collection Time    07/27/12 10:53 PM      Result Value Range Status   MRSA by PCR NEGATIVE  NEGATIVE Final   Comment:            The GeneXpert MRSA Assay (FDA     approved for NASAL specimens     only), is one component of a     comprehensive MRSA colonization     surveillance program. It is not     intended to diagnose MRSA     infection nor to guide or     monitor treatment for     MRSA infections.  URINE CULTURE     Status: None   Collection Time    07/28/12  9:53 AM      Result Value Range Status   Specimen Description URINE, RANDOM   Final   Special Requests NONE   Final   Culture  Setup Time  07/28/2012 21:06   Final   Colony Count 3,000 COLONIES/ML   Final   Culture INSIGNIFICANT GROWTH   Final   Report Status 07/29/2012 FINAL   Final    Anti-infectives   Start     Dose/Rate Route Frequency Ordered Stop   07/31/12 1800  ceFAZolin (ANCEF) IVPB 2 g/50 mL premix     2 g 100 mL/hr over 30 Minutes Intravenous Every T-Th-Sa (1800) 07/30/12 1505     07/28/12 1900  ceFEPIme (MAXIPIME) 2 g in dextrose 5 % 50 mL IVPB  Status:  Discontinued     2 g 100 mL/hr over 30 Minutes Intravenous Every T-Th-Sa (Hemodialysis) 07/28/12 1421 07/28/12 1549   07/28/12 1900  vancomycin (VANCOCIN) IVPB 750 mg/150 ml premix  Status:  Discontinued     750 mg 150 mL/hr over 60 Minutes Intravenous Every T-Th-Sa (Hemodialysis) 07/28/12 1422 07/30/12 1501   07/28/12 0045  vancomycin (VANCOCIN) 1,750 mg in sodium chloride 0.9 % 500 mL IVPB     1,750 mg 250 mL/hr over 120 Minutes Intravenous  Once 07/28/12 0034 07/28/12 0427   07/28/12 0045  ceFEPIme (MAXIPIME) 2 g in dextrose 5 % 50 mL IVPB     2  g 100 mL/hr over 30 Minutes Intravenous  Once 07/28/12 0034 07/28/12 0156   07/28/12 0030  vancomycin (VANCOCIN) IVPB 1000 mg/200 mL premix  Status:  Discontinued     1,000 mg 200 mL/hr over 60 Minutes Intravenous  Once 07/28/12 0019 07/28/12 0133   07/28/12 0030  ceFEPIme (MAXIPIME) 1 g in dextrose 5 % 50 mL IVPB  Status:  Discontinued     1 g 100 mL/hr over 30 Minutes Intravenous  Once 07/28/12 0019 07/28/12 0034      Assessment: 72 year old female with MSSA bacteremia now to de-escalate to Cefazolin.  She is HD-dependent, and has adequate Vancomycin on board to cover her until her next HD session 6/24.  Plan:  Cefazolin 2gm IV with each HD session Monitor for HD schedule and tolerance Follow-up repeat blood cultures and Echo (both performed 6/23)  Estella Husk, Pharm.D., BCPS Clinical Pharmacist Phone: [phone] or [phone] Pager: [phone] 07/30/2012, 3:10 PM

## 2012-07-30 NOTE — Progress Notes (Signed)
UR COMPLETED  

## 2012-07-30 NOTE — Progress Notes (Addendum)
Hypoglycemic Event  CBG: 59  Treatment: 15 GM carbohydrate snack  Symptoms: None  Follow-up CBG: Time:53 CBG Result:0810 (AT 0830 BLOOD SUGAR WAS 81)  Possible Reasons for Event: Unknown  Comments/MD notified: DR SHORT (CONTACT AT 0920)    Holly Ellison  Remember to initiate Hypoglycemia Order Set & complete

## 2012-07-30 NOTE — Progress Notes (Signed)
  Echocardiogram 2D Echocardiogram has been performed.  Holly Ellison 07/30/2012, 11:23 AM 

## 2012-07-30 NOTE — Progress Notes (Addendum)
[facility] PROGRESS NOTE  Holly Ellison ZOX:[id number] DOB: June 04, 1940 DOA: 07/27/2012 PCP: Lucianne Lei, MD  Brief narrative:  Holly Ellison is a 72 y.o.AA female with ESRD on HD since April 2013 , CAD s/p CABG, DM, HTN on TTS dialysis in [place] who presented to the [facility] ED with left sided CP. She had just been seen by the PA during her dialysis treatment 6/19 and had no c/o at that time. She was noted to be afebrile pre and post HD. At [facility], she was found to be febrile. CXR showed. LLL opacity. EKG and troponins were unremarkable per H and P. She was transferred to [facility] for further treatment due to the need for hemodialysis. She has not felt well on an off over the past week, but nonproductive cough and CP started after dialysis. She has chronic headaches and dizziness , the latter is not always associated with positional changes and at times passes out when standing and has no sensation of warning. Receiving PT at home due to LE weakness/neuropathy. Has PCP in [place] who is following these issues. Had recent cataract surgery that she said did not significantly improve her vision (it was hoped this would help her dizziness).   She generally has no problems with her HD treatments.    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 She was initially thought to have possible pneumonia, however, she was not hypoxic, had no rales on exam and her chest x-ray was clear of infiltrates.  Her access site looks clean dry and intact. There is no overlying erythema or cellulitis. It has been functioning fine at hemodialysis. -  MSSA sensitive to all except fluoroquinolones -  DC vancomycin and start Ancef -  Repeat blood cultures -  Echocardiogram pending  T2DM, hypoglycemia overnight -  Discontinue lantus -  Continue SSI  HTN, blood pressure mildly elevated -  Continue norvasc -  Continue BB  CAD s/p 3 MI.  -  Continue beta blocker and statin -  Has been two  years since last cardiac event.  Decrease to ASA 81mg  and continue plavix for now.   -  Continue ranolazine and Imdur  CHF, ICM with grade 2 DD/chronic diastolic heart failure.  Stable.   -  Fluid management via HD  COPD, stable.   ESRD, appreciate nephrology recommendations  Diet:  Renal  Access:  Fistula left arm IVF:  OFF Proph:  heparin  Code Status: full Family Communication: spoke with patient alone Disposition Plan: pending results of next set of blood cultures and ECHO   Consultants:  Nephrology  ID  Procedures:  HD  ECHO  Antibiotics:  Vancomycin 6/20 >> 6/22  Cefepime 6/20 then off  Ancef 6/22 >>   HPI/Subjective:  States she still feels very fatigued or tired. Denies recent fevers or chills, vomiting, diarrhea, chest pain, shortness of breath.  Objective: Filed Vitals:   07/29/12 1400 07/29/12 1800 07/30/12 0433 07/30/12 0935  BP: 152/55 135/53 168/69 162/56  Pulse: 62 64 72 68  Temp: 97.9 F (36.6 C) 98.2 F (36.8 C) 98.2 F (36.8 C) 98.6 F (37 C)  TempSrc: Oral Oral Oral Oral  Resp: 18 20 21 18   Height:      Weight:      SpO2: 95% 98% 93% 95%    Intake/Output Summary (Last 24 hours) at 07/30/12 1500 Last data filed at 07/30/12 0829  Gross per 24 hour  Intake    240 ml  Output  0 ml  Net    240 ml   Filed Weights   07/28/12 1406 07/28/12 1801 07/29/12 0346  Weight: 73.5 kg (162 lb 0.6 oz) 71.1 kg (156 lb 12 oz) 75.9 kg (167 lb 5.3 oz)    Exam:   General:  Adult female, No acute distress  HEENT:  NCAT, MMM  Cardiovascular:  RRR, nl S1, S2 no mrg, 2+ pulses, warm extremities  Respiratory:  CTAB, no increased WOB  Abdomen:  NABS, soft, NT/ND  MSK:  Normal tone and bulk, no LEE.  Fistula c/d/i.  No overlying erythema or induration  Neuro:  Grossly intact  Data Reviewed: Basic Metabolic Panel:  Recent Labs Lab 07/27/12 2309 07/28/12 0415 07/28/12 1435 07/29/12 0530 07/29/12 1150  NA 135 135 132* 135 136  K  3.2* 3.0* 4.0 3.3* 4.1  CL 94* 97 94* 96 97  CO2 29 26 23 30 31   GLUCOSE 168* 149* 117* 51* 80  BUN 29* 33* 41* 21 24*  CREATININE 4.20* 4.29* 5.03* 3.34* 3.57*  CALCIUM 9.8 9.2 8.9 9.1 9.4   Liver Function Tests:  Recent Labs Lab 07/27/12 2309  AST 51*  ALT 37*  ALKPHOS 110  BILITOT 0.5  PROT 7.6  ALBUMIN 3.6   No results found for this basename: LIPASE, AMYLASE,  in the last 168 hours No results found for this basename: AMMONIA,  in the last 168 hours CBC:  Recent Labs Lab 07/27/12 2309 07/28/12 0415 07/29/12 0530  WBC 12.5* 9.9 7.8  NEUTROABS 10.9*  --   --   HGB 11.4* 10.6* 10.5*  HCT 34.3* 31.7* 32.0*  MCV 93.2 93.8 94.1  PLT 229 191 197   Cardiac Enzymes:  Recent Labs Lab 07/27/12 2310  TROPONINI <0.30   BNP (last 3 results) No results found for this basename: PROBNP,  in the last 8760 hours CBG:  Recent Labs Lab 07/29/12 2141 07/30/12 0743 07/30/12 0810 07/30/12 0832 07/30/12 1156  GLUCAP 113* 59* 53* 81 116*    Recent Results (from the past 240 hour(s))  MRSA PCR SCREENING     Status: None   Collection Time    07/27/12 10:53 PM      Result Value Range Status   MRSA by PCR NEGATIVE  NEGATIVE Final   Comment:            The GeneXpert MRSA Assay (FDA     approved for NASAL specimens     only), is one component of a     comprehensive MRSA colonization     surveillance program. It is not     intended to diagnose MRSA     infection nor to guide or     monitor treatment for     MRSA infections.  URINE CULTURE     Status: None   Collection Time    07/28/12  9:53 AM      Result Value Range Status   Specimen Description URINE, RANDOM   Final   Special Requests NONE   Final   Culture  Setup Time 07/28/2012 21:06   Final   Colony Count 3,000 COLONIES/ML   Final   Culture INSIGNIFICANT GROWTH   Final   Report Status 07/29/2012 FINAL   Final     Studies: No results found.  Scheduled Meds: . amLODipine  10 mg Oral QHS  . aspirin  325  mg Oral Daily  . atorvastatin  40 mg Oral Daily  . clopidogrel  75 mg Oral Daily  . [  START ON 08/04/2012] darbepoetin (ARANESP) injection - DIALYSIS  40 mcg Intravenous Q Sat-HD  . doxercalciferol  6 mcg Intravenous Q T,Th,Sa-HD  . insulin aspart  0-9 Units Subcutaneous TID WC  . isosorbide mononitrate  30 mg Oral Daily  . metoprolol  50 mg Oral BID  . multivitamin  1 tablet Oral Daily  . pantoprazole  40 mg Oral Daily  . ranolazine  500 mg Oral BID  . sodium chloride  3 mL Intravenous Q12H  . sodium chloride  3 mL Intravenous Q12H  . vancomycin  750 mg Intravenous Q T,Th,Sa-HD   Continuous Infusions:   Principal Problem:   HCAP (healthcare-associated pneumonia) Active Problems:   Diabetes mellitus   Hypertension   COPD (chronic obstructive pulmonary disease)   ESRD on dialysis   Sepsis    Time spent: 30 min    Jonluke Cobbins, [facility]  [facility] Pager [phone]. If 7PM-7AM, please contact night-coverage at [email or web address], password [facility] 07/30/2012, 3:00 PM  LOS: 3 days

## 2012-07-30 NOTE — Consult Note (Signed)
[facility]  Total days of antibiotics 4        Day 2 vancomycin        Day 1 cefepime               Reason for Consult: staph aureus bacteremia    Referring Physician: short  Principal Problem:   HCAP (healthcare-associated pneumonia) Active Problems:   Diabetes mellitus   Hypertension   COPD (chronic obstructive pulmonary disease)   ESRD on dialysis   Bacteremia due to Staphylococcus aureus    HPI: Holly Ellison is a 72 y.o. female with h/o CAD s/p CABG, HTN, DM type 2, ESRD on HD(Tues-Thur-Sat) presented with 1 day of altered mental status went to the [facility] ED where she was found to be febrile and chest x-ray showed left lower lobe opacity. EKG and troponins were unremarkable since there was complaint of chest pain. Patient has been transferred to [facility] for further management as patient requires dialysis. Patient denies any nausea vomiting abdominal pain dysuria or discharges or diarrhea. She denies having chest pain, dysuria, N/V/diarrhea, arthralgia, myalgia or rash. Blood cultures from [facility] are showing MSSA.  Past Medical History  Diagnosis Date  . Chronic kidney disease     just found out? Placed graft in [facility]-Dr. Orvan Falconer in [facility].  started dialysis in 06/2011  . Arthritis     Osteoarthritis  . Neuropathy in diabetes   . Insomnia   . Asthma   . CAD (coronary artery disease) 2012    7 stents-First PCI was in [place].  The last time she had a procedure was  a stent last year, thena  CABG was undertaken in October [facility])  . GERD (gastroesophageal reflux disease)   . Diabetes mellitus     Type II  . COPD (chronic obstructive pulmonary disease)   . Hypertension   . Peripheral vascular disease     endarterectomy  . Anemia   . CHF (congestive heart failure)   . Coronary artery dilation   . Myocardial infarction 2013    x 3  . 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 Dilaudid (Hydromorphone Hcl) Itching  . Morphine Sulfate Itching  . Opium Itching  . Ciprocin-Fluocin-Procin (Fluocinolone Acetonide) Itching   MEDICATIONS: . amLODipine  10 mg Oral QHS  . aspirin EC  81 mg Oral Daily  . atorvastatin  40 mg Oral Daily  . [START ON 07/31/2012]  ceFAZolin (ANCEF) IV  2 g Intravenous Q T,Th,Sat-1800  . clopidogrel  75 mg Oral Daily  . [START ON 08/04/2012] darbepoetin (ARANESP) injection - DIALYSIS  40 mcg Intravenous Q Sat-HD  . doxercalciferol  6 mcg Intravenous Q T,Th,Sa-HD  . heparin subcutaneous  5,000 Units Subcutaneous Q8H  . insulin aspart  0-9 Units Subcutaneous TID WC  . isosorbide mononitrate  30 mg Oral Daily  . metoprolol  50 mg Oral BID  . multivitamin  1 tablet Oral Daily  . pantoprazole  40 mg Oral Daily  . ranolazine  500 mg Oral BID  . sodium chloride  3 mL Intravenous Q12H  . sodium chloride  3 mL Intravenous Q12H    History  Substance Use Topics  . Smoking status: Former Smoker    Types: Cigarettes    Quit date: 05/24/1996  . Smokeless tobacco: Never Used  . Alcohol Use: No  Family History  Problem Relation Age of Onset  . Diabetes type II     Review of Systems  Constitutional: Negative for fever, chills, diaphoresis, activity change, appetite change, fatigue and unexpected weight change.  HENT: Negative for congestion, sore throat, rhinorrhea, sneezing, trouble swallowing and sinus pressure.  Eyes: Negative for photophobia and visual disturbance.  Respiratory: Negative for cough, chest tightness, shortness of breath, wheezing and stridor.  Cardiovascular: Negative for chest pain, palpitations and leg swelling.  Gastrointestinal: Negative for nausea, vomiting, abdominal pain, diarrhea, constipation, blood in stool, abdominal distention and anal bleeding.  Genitourinary: Negative for dysuria, hematuria, flank pain and difficulty urinating.  Musculoskeletal: Negative for myalgias, back  pain, joint swelling, arthralgias and gait problem.  Skin: Negative for color change, pallor, rash and wound.  Neurological: Negative for dizziness, tremors, weakness and light-headedness.  Hematological: Negative for adenopathy. Does not bruise/bleed easily.  Psychiatric/Behavioral: Negative for behavioral problems, confusion, sleep disturbance, dysphoric mood, decreased concentration and agitation.     OBJECTIVE: Temp:  [98.1 F (36.7 C)-98.6 F (37 C)] 98.1 F (36.7 C) (06/23 2002) Pulse Rate:  [68-72] 72 (06/23 2002) Resp:  [18-21] 18 (06/23 2002) BP: (162-186)/(56-75) 186/75 mmHg (06/23 2002) SpO2:  [93 %-98 %] 94 % (06/23 2002) Weight:  [167 lb 14.4 oz (76.159 kg)] 167 lb 14.4 oz (76.159 kg) (06/23 2002)  Physical Exam  Constitutional: oriented to person, place, and time.  appears well-developed and well-nourished. No distress.  HENT:  Mouth/Throat: Oropharynx is clear and moist. No oropharyngeal exudate.  Cardiovascular: Normal rate, regular rhythm and normal heart sounds. Exam reveals no gallop and no friction rub.  No murmur heard.  Pulmonary/Chest: Effort normal and breath sounds normal. No respiratory distress.  no wheezes.  Abdominal: Soft. Bowel sounds are normal.  exhibits no distension. There is no tenderness.  Lymphadenopathy:  no cervical adenopathy.  Neurological:  alert and oriented to person, place, and time.  Skin: Skin is warm and dry. No rash noted. No erythema.  Psychiatric: a normal mood and affect.  behavior is normal.   LABS: Results for orders placed during the hospital encounter of 07/27/12 (from the past 48 hour(s))  CBC     Status: Abnormal   Collection Time    07/29/12  5:30 AM      Result Value Range   WBC 7.8  4.0 - 10.5 K/uL   RBC 3.40 (*) 3.87 - 5.11 MIL/uL   Hemoglobin 10.5 (*) 12.0 - 15.0 g/dL   HCT 16.1 (*) 09.6 - 04.5 %   MCV 94.1  78.0 - 100.0 fL   MCH 30.9  26.0 - 34.0 pg   MCHC 32.8  30.0 - 36.0 g/dL   RDW 40.9  81.1 - 91.4 %    Platelets 197  150 - 400 K/uL  BASIC METABOLIC PANEL     Status: Abnormal   Collection Time    07/29/12  5:30 AM      Result Value Range   Sodium 135  135 - 145 mEq/L   Potassium 3.3 (*) 3.5 - 5.1 mEq/L   Chloride 96  96 - 112 mEq/L   CO2 30  19 - 32 mEq/L   Glucose, Bld 51 (*) 70 - 99 mg/dL   BUN 21  6 - 23 mg/dL   Creatinine, Ser 7.82 (*) 0.50 - 1.10 mg/dL   Comment: DELTA CHECK NOTED   Calcium 9.1  8.4 - 10.5 mg/dL   GFR calc non Af Amer 13 (*) >90 mL/min  GFR calc Af Amer 15 (*) >90 mL/min   Comment:            The eGFR has been calculated     using the CKD EPI equation.     This calculation has not been     validated in all clinical     situations.     eGFR's persistently     <90 mL/min signify     possible Chronic Kidney Disease.  GLUCOSE, CAPILLARY     Status: Abnormal   Collection Time    07/29/12  7:38 AM      Result Value Range   Glucose-Capillary 58 (*) 70 - 99 mg/dL  GLUCOSE, CAPILLARY     Status: None   Collection Time    07/29/12  8:15 AM      Result Value Range   Glucose-Capillary 75  70 - 99 mg/dL  GLUCOSE, CAPILLARY     Status: Abnormal   Collection Time    07/29/12 11:28 AM      Result Value Range   Glucose-Capillary 137 (*) 70 - 99 mg/dL   Comment 1 Documented in Chart     Comment 2 Notify RN    BASIC METABOLIC PANEL     Status: Abnormal   Collection Time    07/29/12 11:50 AM      Result Value Range   Sodium 136  135 - 145 mEq/L   Potassium 4.1  3.5 - 5.1 mEq/L   Chloride 97  96 - 112 mEq/L   CO2 31  19 - 32 mEq/L   Glucose, Bld 80  70 - 99 mg/dL   BUN 24 (*) 6 - 23 mg/dL   Creatinine, Ser 7.82 (*) 0.50 - 1.10 mg/dL   Calcium 9.4  8.4 - 95.6 mg/dL   GFR calc non Af Amer 12 (*) >90 mL/min   GFR calc Af Amer 14 (*) >90 mL/min   Comment:            The eGFR has been calculated     using the CKD EPI equation.     This calculation has not been     validated in all clinical     situations.     eGFR's persistently     <90 mL/min signify      possible Chronic Kidney Disease.  GLUCOSE, CAPILLARY     Status: Abnormal   Collection Time    07/29/12  4:51 PM      Result Value Range   Glucose-Capillary 121 (*) 70 - 99 mg/dL   Comment 1 Documented in Chart     Comment 2 Notify RN    GLUCOSE, CAPILLARY     Status: Abnormal   Collection Time    07/29/12  9:41 PM      Result Value Range   Glucose-Capillary 113 (*) 70 - 99 mg/dL  GLUCOSE, CAPILLARY     Status: Abnormal   Collection Time    07/30/12  7:43 AM      Result Value Range   Glucose-Capillary 59 (*) 70 - 99 mg/dL   Comment 1 Documented in Chart     Comment 2 Notify RN    GLUCOSE, CAPILLARY     Status: Abnormal   Collection Time    07/30/12  8:10 AM      Result Value Range   Glucose-Capillary 53 (*) 70 - 99 mg/dL   Comment 1 Documented in Chart     Comment 2 Notify RN  GLUCOSE, CAPILLARY     Status: None   Collection Time    07/30/12  8:32 AM      Result Value Range   Glucose-Capillary 81  70 - 99 mg/dL   Comment 1 Documented in Chart     Comment 2 Notify RN    GLUCOSE, CAPILLARY     Status: Abnormal   Collection Time    07/30/12 11:56 AM      Result Value Range   Glucose-Capillary 116 (*) 70 - 99 mg/dL   Comment 1 Documented in Chart     Comment 2 Notify RN    GLUCOSE, CAPILLARY     Status: Abnormal   Collection Time    07/30/12  4:48 PM      Result Value Range   Glucose-Capillary 107 (*) 70 - 99 mg/dL   Comment 1 Documented in Chart     Comment 2 Notify RN    GLUCOSE, CAPILLARY     Status: Abnormal   Collection Time    07/30/12  9:02 PM      Result Value Range   Glucose-Capillary 116 (*) 70 - 99 mg/dL    MICRO: 1/61 blood cx NGTD 6/21 urine cx :3,ooo insig growth  Assessment/Plan:  71yo F with DM, ESRD on HD (t-Th-S) found to have fevers, encephalopathy likely due to MSSA bacteremia. Unclear source of infection, TTE does not show vegetations. Repeat blood cultures pending  - for now would recommend to treat as complicated bacteremia with at  least 4 wks of IV therapy - will discuss again with patient with she has any other localizing pain such as back pain which we would recommend to scan looking for diskitis - will provide further recs as more information becomes available.  Duke Salvia Drue Second MD MPH [facility] [phone]

## 2012-07-31 DIAGNOSIS — G9340 Encephalopathy, unspecified: Secondary | ICD-10-CM

## 2012-07-31 DIAGNOSIS — R509 Fever, unspecified: Secondary | ICD-10-CM

## 2012-07-31 LAB — COMPREHENSIVE METABOLIC PANEL
Albumin: 3 g/dL — ABNORMAL LOW (ref 3.5–5.2)
Alkaline Phosphatase: 175 U/L — ABNORMAL HIGH (ref 39–117)
BUN: 43 mg/dL — ABNORMAL HIGH (ref 6–23)
CO2: 27 mEq/L (ref 19–32)
Chloride: 97 mEq/L (ref 96–112)
GFR calc non Af Amer: 8 mL/min — ABNORMAL LOW (ref >90)
Potassium: 4.4 mEq/L (ref 3.5–5.1)
Total Bilirubin: 0.5 mg/dL (ref 0.3–1.2)

## 2012-07-31 LAB — CBC
HCT: 30.8 % — ABNORMAL LOW (ref 36.0–46.0)
Hemoglobin: 10.3 g/dL — ABNORMAL LOW (ref 12.0–15.0)
MCV: 92.2 fL (ref 78.0–100.0)
RBC: 3.34 MIL/uL — ABNORMAL LOW (ref 3.87–5.11)
RDW: 13.9 % (ref 11.5–15.5)
WBC: 7.2 10*3/uL (ref 4.0–10.5)

## 2012-07-31 LAB — GLUCOSE, CAPILLARY
Glucose-Capillary: 79 mg/dL (ref 70–99)
Glucose-Capillary: 103 mg/dL — ABNORMAL HIGH (ref 70–99)
Glucose-Capillary: 144 mg/dL — ABNORMAL HIGH (ref 70–99)

## 2012-07-31 LAB — PHOSPHORUS: Phosphorus: 4.7 mg/dL — ABNORMAL HIGH (ref 2.3–4.6)

## 2012-07-31 MED ORDER — DOXERCALCIFEROL 4 MCG/2ML IV SOLN
INTRAVENOUS | Status: AC
  Filled 2012-07-31: qty 4

## 2012-07-31 MED ORDER — PROMETHAZINE HCL 12.5 MG PO TABS: 12.5000 mg | ORAL_TABLET | Freq: Four times a day (QID) | ORAL | Status: DC | PRN

## 2012-07-31 MED ORDER — AMLODIPINE BESYLATE 10 MG PO TABS: 10.0000 mg | ORAL_TABLET | Freq: Every day | ORAL | Status: DC

## 2012-07-31 MED ORDER — NEPRO/CARBSTEADY PO LIQD: 237.0000 mL | ORAL | Status: DC | PRN

## 2012-07-31 MED ORDER — ISOSORBIDE MONONITRATE ER 30 MG PO TB24: 30.0000 mg | ORAL_TABLET | Freq: Every day | ORAL | Status: DC

## 2012-07-31 MED ORDER — ASPIRIN 81 MG PO TBEC: 81.0000 mg | DELAYED_RELEASE_TABLET | Freq: Every day | ORAL | Status: DC

## 2012-07-31 NOTE — Discharge Summary (Signed)
Physician Discharge Summary  Shiree Altemus ZOX:[id number] DOB: 1940/11/20 DOA: 07/27/2012  PCP: Lucianne Lei, MD  Admit date: 07/27/2012 Discharge date: 07/31/2012  Recommendations for Outpatient Follow-up:  1. Resume home health PT 2. Resume previous dialysis 3. Continue antibiotics for 4 weeks for complicated bacteremia with ANCEF during HD through July 21st, then stop. 4. Primary care doctor in one week to review fingersticks and followup pending blood cultures.  Discharge Diagnoses:  Principal Problem:   HCAP (healthcare-associated pneumonia) Active Problems:   Diabetes mellitus   Hypertension   COPD (chronic obstructive pulmonary disease)   ESRD on dialysis   Bacteremia due to Staphylococcus aureus   Discharge Condition: Stable, improved  Diet recommendation: May use regular diet until her appetite improves and then resume her renal Dialysis diet  Wt Readings from Last 3 Encounters:  07/31/12 75.5 kg (166 lb 7.2 oz)  11/01/11 70.4 kg (155 lb 3.3 oz)  06/11/11 74.6 kg (164 lb 7.4 oz)    History of present illness:  Holly Ellison is a 72 y.o. female with known history of CAD status post CABG, hypertension, diabetes mellitus type 2, ESRD on hemodialysis on Tuesday Thursday and Saturday started experiencing chest pain last 2 days on the left side of the chest. Patient's chest pain is pleuritic in nature with some nonproductive cough denies any associated fever chills. In the ER at [facility] patient was found to be febrile and chest x-ray showed left lower lobe opacity. EKG and troponins were unremarkable. Patient has been transferred to [facility] for further management as patient requires dialysis. Patient denies any nausea vomiting abdominal pain dysuria or discharges or diarrhea.  Hospital Course:   MSSA bacteremia, likely source was her fistula. She was initially thought to have possible pneumonia, however, she was not hypoxic, had no rales on exam, and her chest x-ray  was clear of infiltrates. Her access site looks clean, dry, and intact. There is no overlying erythema or cellulitis. It functioned fine during hemodialysis. Her staph aureus was sensitive to all antibiotics except fluoroquinolones. She was initially on empiric vancomycin for gram-positive cocci bacteremia, but this was narrowed to ancef after the results of her blood culture were reported. Her transthoracic echocardiogram demonstrated no evidence of vegetation.  She was seen by infectious disease who recommended that the patient be vigilant about any other localizing symptoms of infection, such as back pain. Blood cultures from June 23rd are so far no growth to date.  During this admission she has had none. She will complete a four-week course of antibiotics at hemodialysis, the last day is July 21.  Type 2 diabetes, recurrent hypoglycemia on her home insulin regimen. Her Lantus was discontinued, and her fingersticks remain stable. She used sliding scale insulin instead. She should not resume her Lantus at this time, and instead check her fingersticks q. a.c. at bedtime and if they start to rise and her morning fingersticks are persistently in the 200s, she can resume her Lantus.  She should keep a log of her fingersticks, and bring it with her to her primary care doctor's appointment in one week. They may restart some of her Lantus at that time based on her blood sugars.  Hypertension with mildly elevated blood pressures. Continue Norvasc and beta blocker. CAD status post 3 heart attacks. She continued her beta blocker and statin. Her Plavix was continued however her aspirin was decreased from 325-81 mg since it has been over 2 years since her last cardiac event. She continued her Ranolazine and  imdur.  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 Stable. Fluid management with hemodialysis. COPD, stable, continue to medications End-stage renal disease, hemodialysis on Tuesday,  Thursday, Saturday. She was seen by nephrology and continued her hemodialysis, last session on Tuesday, June 24. She should resume her on dialysis on the 26th.   Consultants:  Nephrology  ID Procedures:  HD  ECHO Antibiotics:  Vancomycin 6/20 >> 6/22  Cefepime 6/20 then off  Ancef 6/22 >>    Discharge Exam: Filed Vitals:   07/31/12 1212  BP: 170/82  Pulse: 73  Temp:   Resp: 16   Filed Vitals:   07/31/12 0505 07/31/12 0810 07/31/12 1200 07/31/12 1212  BP: 126/48 121/79 169/73 170/82  Pulse: 65 66 72 73  Temp: 97.6 F (36.4 C) 98.4 F (36.9 C) 97 F (36.1 C)   TempSrc: Oral Oral Oral   Resp: 18 18 16 16   Height:      Weight:   75.5 kg (166 lb 7.2 oz)   SpO2: 99% 98% 93%    Continues to feel fatigued. Asking if she can resume her home health physical therapy.  General: Adult female, No acute distress, sitting in chair HEENT: NCAT, MMM  Cardiovascular: RRR, nl S1, S2 no mrg, 2+ pulses, warm extremities  Respiratory: CTAB, no increased WOB  Abdomen: NABS, soft, NT/ND  MSK: Normal tone and bulk, no LEE. Fistula c/d/i. No overlying erythema or induration  Neuro: Grossly intact   Discharge Instructions      Discharge Orders   Future Orders Complete By Expires     (HEART FAILURE PATIENTS) Call MD:  Anytime you have any of the following symptoms: 1) 3 pound weight gain in 24 hours or 5 pounds in 1 week 2) shortness of breath, with or without a dry hacking cough 3) swelling in the hands, feet or stomach 4) if you have to sleep on extra pillows at night in order to breathe.  As directed     Call MD for:  difficulty breathing, headache or visual disturbances  As directed     Call MD for:  extreme fatigue  As directed     Call MD for:  hives  As directed     Call MD for:  persistant dizziness or light-headedness  As directed     Call MD for:  persistant nausea and vomiting  As directed     Call MD for:  severe uncontrolled pain  As directed     Call MD for:  temperature  >100.4  As directed     Diet general  As directed     Comments:      You may eat a regular diet until your appetite improves and then resume your previous renal dialysis diet.    Discharge instructions  As directed     Comments:      Hospitalized with blood stream infection with staph aureus which was sensitive to most antibiotics. You will need to have antibiotics administered the during hemodialysis for the next 4 weeks.  Your ultrasound of your heart showed no evidence of infection on any of her heart valves. Your blood cultures drawn on June 23 has not had any growth so far. Your primary care doctor will need to followup on these results in one week.  Because of high blood pressure, you should continue to take Norvasc and metoprolol.  For heart disease and angina you may continue imdur and ranolazine and atorvastatin.  Your blood sugars were  low, so please do not take her Lantus for now. Please check your blood sugars before breakfast, lunch, dinner, and bed. If your morning fingersticks are greater than 200, please resume your Lantus. Otherwise, please keep a log of your fingersticks and bring them with you to your primary care doctor's appointment in one week.    Increase activity slowly  As directed         Medication List    STOP taking these medications       aspirin 325 MG tablet     LANTUS SOLOSTAR 100 UNIT/ML Sopn  Generic drug:  Insulin Glargine      TAKE these medications       amLODipine 10 MG tablet  Commonly known as:  NORVASC  Take 1 tablet (10 mg total) by mouth at bedtime.     aspirin 81 MG EC tablet  Take 1 tablet (81 mg total) by mouth daily.     atorvastatin 40 MG tablet  Commonly known as:  LIPITOR  Take 40 mg by mouth daily.     clopidogrel 75 MG tablet  Commonly known as:  PLAVIX  Take 1 tablet (75 mg total) by mouth daily.     feeding supplement (NEPRO CARB STEADY) Liqd  Take 237 mLs by mouth as needed (missed meal during dialysis.).     gabapentin  100 MG capsule  Commonly known as:  NEURONTIN  Take 100 mg by mouth 3 (three) times daily.     insulin aspart 100 unit/mL injection  Commonly known as:  novoLOG  Inject 2 Units into the skin 2 (two) times daily with a meal. Sliding scale: No units if <120, 3 units if 120-150, 4 if 150-180     isosorbide mononitrate 30 MG 24 hr tablet  Commonly known as:  IMDUR  Take 1 tablet (30 mg total) by mouth daily.     methimazole 10 MG tablet  Commonly known as:  TAPAZOLE  Take 10 mg by mouth daily.     metolazone 2.5 MG tablet  Commonly known as:  ZAROXOLYN  Take 2.5 mg by mouth daily as needed.     metoprolol 100 MG tablet  Commonly known as:  LOPRESSOR  Take 50 mg by mouth 2 (two) times daily.     Oxycodone HCl 10 MG Tabs  Take 10 mg by mouth 3 (three) times daily as needed (Pain).     pantoprazole 40 MG tablet  Commonly known as:  PROTONIX  Take 40 mg by mouth daily.     promethazine 12.5 MG tablet  Commonly known as:  PHENERGAN  Take 1 tablet (12.5 mg total) by mouth every 6 (six) hours as needed for nausea.     ranolazine 500 MG 12 hr tablet  Commonly known as:  RANEXA  Take 500 mg by mouth 2 (two) times daily.       Follow-up Information   Follow up with UPPIN,NINA, MD. Schedule an appointment as soon as possible for a visit in 1 week.   Contact information:   [address] [place] [address] [phone]       The results of significant diagnostics from this hospitalization (including imaging, microbiology, ancillary and laboratory) are listed below for reference.    Significant Diagnostic Studies: Dg Chest 2 View  07/28/2012   *RADIOLOGY REPORT*  Clinical Data: Pneumonia.  CHEST - 2 VIEW  Comparison: Chest x-ray on 07/27/2012 at [facility].  Findings: There is resolution of left lower lung opacity  from the portable chest x-ray yesterday consistent with resolved atelectasis.  No residual findings of atelectasis or infiltrate. The patient has  had prior CABG.  Heart is mildly enlarged.  No edema, nodule or pleural fluid is identified.  Bony thorax is unremarkable.  IMPRESSION: No active disease.  Resolution of left basilar density consistent with resolved atelectasis.   Original Report Authenticated By: Irish Lack, M.D.    Microbiology: Recent Results (from the past 240 hour(s))  MRSA PCR SCREENING     Status: None   Collection Time    07/27/12 10:53 PM      Result Value Range Status   MRSA by PCR NEGATIVE  NEGATIVE Final   Comment:            The GeneXpert MRSA Assay (FDA     approved for NASAL specimens     only), is one component of a     comprehensive MRSA colonization     surveillance program. It is not     intended to diagnose MRSA     infection nor to guide or     monitor treatment for     MRSA infections.  URINE CULTURE     Status: None   Collection Time    07/28/12  9:53 AM      Result Value Range Status   Specimen Description URINE, RANDOM   Final   Special Requests NONE   Final   Culture  Setup Time 07/28/2012 21:06   Final   Colony Count 3,000 COLONIES/ML   Final   Culture INSIGNIFICANT GROWTH   Final   Report Status 07/29/2012 FINAL   Final  CULTURE, BLOOD (ROUTINE X 2)     Status: None   Collection Time    07/30/12  7:30 AM      Result Value Range Status   Specimen Description BLOOD RIGHT ARM   Final   Special Requests BOTTLES DRAWN AEROBIC ONLY 10CC   Final   Culture  Setup Time 07/30/2012 12:49   Final   Culture     Final   Value:        BLOOD CULTURE RECEIVED NO GROWTH TO DATE CULTURE WILL BE HELD FOR 5 DAYS BEFORE ISSUING A FINAL NEGATIVE REPORT   Report Status PENDING   Incomplete  CULTURE, BLOOD (ROUTINE X 2)     Status: None   Collection Time    07/30/12  7:40 AM      Result Value Range Status   Specimen Description BLOOD RIGHT FOREARM   Final   Special Requests BOTTLES DRAWN AEROBIC ONLY 6CC   Final   Culture  Setup Time 07/30/2012 12:49   Final   Culture     Final   Value:         BLOOD CULTURE RECEIVED NO GROWTH TO DATE CULTURE WILL BE HELD FOR 5 DAYS BEFORE ISSUING A FINAL NEGATIVE REPORT   Report Status PENDING   Incomplete     Labs: Basic Metabolic Panel:  Recent Labs Lab 07/28/12 0415 07/28/12 1435 07/29/12 0530 07/29/12 1150 07/31/12 1135  NA 135 132* 135 136 135  K 3.0* 4.0 3.3* 4.1 4.4  CL 97 94* 96 97 97  CO2 26 23 30 31 27   GLUCOSE 149* 117* 51* 80 98  BUN 33* 41* 21 24* 43*  CREATININE 4.29* 5.03* 3.34* 3.57* 5.04*  CALCIUM 9.2 8.9 9.1 9.4 9.5  PHOS  --   --   --   --  4.7*  Liver Function Tests:  Recent Labs Lab 07/27/12 2309 07/31/12 1135  AST 51* 30  ALT 37* 54*  ALKPHOS 110 175*  BILITOT 0.5 0.5  PROT 7.6 7.2  ALBUMIN 3.6 3.0*   No results found for this basename: LIPASE, AMYLASE,  in the last 168 hours No results found for this basename: AMMONIA,  in the last 168 hours CBC:  Recent Labs Lab 07/27/12 2309 07/28/12 0415 07/29/12 0530 07/31/12 1135  WBC 12.5* 9.9 7.8 7.2  NEUTROABS 10.9*  --   --   --   HGB 11.4* 10.6* 10.5* 10.3*  HCT 34.3* 31.7* 32.0* 30.8*  MCV 93.2 93.8 94.1 92.2  PLT 229 191 197 253   Cardiac Enzymes:  Recent Labs Lab 07/27/12 2310  TROPONINI <0.30   BNP: BNP (last 3 results) No results found for this basename: PROBNP,  in the last 8760 hours CBG:  Recent Labs Lab 07/30/12 0832 07/30/12 1156 07/30/12 1648 07/30/12 2102 07/31/12 0744  GLUCAP 81 116* 107* 116* 79    Time coordinating discharge: 45 minutes  Signed:  Ellajane Stong  [facility] 07/31/2012, 2:28 PM

## 2012-07-31 NOTE — Procedures (Signed)
Pt seen on HD  Ap 170  Vp 170  SBP 180 .  Will increase UF goal to 3 liters.

## 2012-07-31 NOTE — Progress Notes (Signed)
Holly Ellison Progress Note  Subjective:   Sitting in recliner without O2. Does like eating so early in the am  Objective Filed Vitals:   07/30/12 0935 07/30/12 1800 07/30/12 2002 07/31/12 0505  BP: 162/56 178/64 186/75 126/48  Pulse: 68 69 72 65  Temp: 98.6 F (37 C)  98.1 F (36.7 C) 97.6 F (36.4 C)  TempSrc: Oral  Oral Oral  Resp: 18 20 18 18   Height:      Weight:   76.159 kg (167 lb 14.4 oz)   SpO2: 95% 98% 94% 99%   Physical Exam General: NAD breathing easily Heart: RRR Lungs: occ crackle reight base Abdomen: soft Extremities: no LE edema Dialysis Access: left upper AVF + bruit  Dialysis Orders: Center:[facility] TTS 4hrs F180 400/600 2K/2.25Ca [place] EDW 75kg LUA AVF Epo 1200 (just decreased from 1400) Venofer none/ Hectorol 68mcg/ Heparin 2100  Recent labs: Hgb 10.7 6/19, 33% sat 5/29 and ferritin 1500 4/24   Problem/Plan:  1. MSSA Bacteremia Vanco changed to Ancef. Echo neg for veg. ID rec treat 4 weeks.?? Etiology; will be glad to dose Ancef at her dialysis center for the duration of therapy 2. ESRD - cont TTS HD; labs pre HD today 3. Hypertension/volume -BP ^n norvasc 10mg  q day change to hs and metoprolol 50 bid; attempt to lower with decreased volume in HD - get standing weights with orthostatics   4. Anemia - Hgb 10.5 - will give 6.25 Aranesp - no Fe 5. Metabolic bone disease - Continue hectorol; not on binder; check P pre HD 6. Nutrition - renal diet/  + multivit  7. CAD - on BB, plavix, imdur and asa, ranexa and statin  8. Slight ^ LFTs -? Related to vol . Fu pre hd levels in am,uf as above. 9. DM -  bs 70 = 116; per admit team= Lantus + SSI 10. Disp - ? D/c today  Sheffield Slider, PA-C [facility] Beeper [phone] 07/31/2012,8:17 AM  LOS: 4 days  I have seen and examined this patient and agree with plan per Bard Herbert.  She feels well and is ready to go after HD today.  Cont 4 weeks of ancef at outpt HD.Marland Kitchen Paiten Boies  T,MD 07/31/2012 9:49 AM  Additional Objective Labs: Basic Metabolic Panel:  Recent Labs Lab 07/28/12 1435 07/29/12 0530 07/29/12 1150  NA 132* 135 136  K 4.0 3.3* 4.1  CL 94* 96 97  CO2 23 30 31   GLUCOSE 117* 51* 80  BUN 41* 21 24*  CREATININE 5.03* 3.34* 3.57*  CALCIUM 8.9 9.1 9.4   Liver Function Tests:  Recent Labs Lab 07/27/12 2309  AST 51*  ALT 37*  ALKPHOS 110  BILITOT 0.5  PROT 7.6  ALBUMIN 3.6   CBC:  Recent Labs Lab 07/27/12 2309 07/28/12 0415 07/29/12 0530  WBC 12.5* 9.9 7.8  NEUTROABS 10.9*  --   --   HGB 11.4* 10.6* 10.5*  HCT 34.3* 31.7* 32.0*  MCV 93.2 93.8 94.1  PLT 229 191 197   Blood Culture    Component Value Date/Time   SDES URINE, RANDOM 07/28/2012 0953   SPECREQUEST NONE 07/28/2012 0953   CULT INSIGNIFICANT GROWTH 07/28/2012 0953   REPTSTATUS 07/29/2012 FINAL 07/28/2012 0953    Cardiac Enzymes:  Recent Labs Lab 07/27/12 2310  TROPONINI <0.30   CBG:  Recent Labs Lab 07/30/12 0832 07/30/12 1156 07/30/12 1648 07/30/12 2102 07/31/12 0744  GLUCAP 81 116* 107* 116* 79  Medications:   . amLODipine  10 mg Oral QHS  . aspirin EC  81 mg Oral Daily  . atorvastatin  40 mg Oral Daily  .  ceFAZolin (ANCEF) IV  2 g Intravenous Q T,Th,Sat-1800  . clopidogrel  75 mg Oral Daily  . [START ON 08/04/2012] darbepoetin (ARANESP) injection - DIALYSIS  40 mcg Intravenous Q Sat-HD  . doxercalciferol  6 mcg Intravenous Q T,Th,Sa-HD  . heparin subcutaneous  5,000 Units Subcutaneous Q8H  . insulin aspart  0-9 Units Subcutaneous TID WC  . isosorbide mononitrate  30 mg Oral Daily  . metoprolol  50 mg Oral BID  . multivitamin  1 tablet Oral Daily  . pantoprazole  40 mg Oral Daily  . ranolazine  500 mg Oral BID  . sodium chloride  3 mL Intravenous Q12H  . sodium chloride  3 mL Intravenous Q12H

## 2012-07-31 NOTE — Care Management Note (Signed)
   CARE MANAGEMENT NOTE 07/31/2012  Patient:  Holly Ellison, Holly Ellison   Account Number:  [id number]  Date Initiated:  07/31/2012  Documentation initiated by:  Westley Blass  Subjective/Objective Assessment:   Orders for HHPT and HHOT     Action/Plan:   Met with pt who states that she is active with HH, however she is unable to provide name of agency. States that [facility] was arranged by [facility]. This CM attempting to reach that agency.   Anticipated DC Date:  07/31/2012   Anticipated DC Plan:  HOME W HOME HEALTH SERVICES         Choice offered to / List presented to:          [facility] arranged  HH-2 PT  HH-3 OT      HH agency  [facility]   Status of service:  Completed, signed off Medicare Important Message given?   (If response is "NO", the following Medicare IM given date fields will be blank) Date Medicare IM given:   Date Additional Medicare IM given:    Discharge Disposition:  HOME W HOME HEALTH SERVICES  Per UR Regulation:    If discussed at Long Length of Stay Meetings, dates discussed:    Comments:  07/31/12 4pm, able to reach [facility], spoke with socialworker who stated that pt was active with [facility] [address] in [place]. That agency was contacted and info faxed for HHPT and HHOT, as well as d/c summary. CRoyal RN MPH, [phone]  07/31/2012 Met with pt who states that she currently has HHPT coming to her home, however is unable to recall the name of the agency. This CM placed call to several local agencies and none are active with this pt. Also called pt home and daughter to ask family, no answers. Pt RN aware and will check with any family memebers who come to visit pt.

## 2012-07-31 NOTE — Evaluation (Signed)
Physical Therapy Evaluation Patient Details Name: Holly Ellison MRN: [id number] DOB: [birth year] Today's Date: 07/31/2012 Time: 4098-1191 PT Time Calculation (min): 32 min  PT Assessment / Plan / Recommendation Clinical Impression  72 yo female with hx of CAD, s/p CABG, htn, dmII, esrd, admitted with chest pain found to have bacteremia.   Pt has multiple c/o and deconditiioning, but is able to walk with RW.  Recommend continued PT with HHPT as prior to admission with no futher DME needed    PT Assessment  All further PT needs can be met in the next venue of care;Patient needs continued PT services    Follow Up Recommendations  Home health PT    Does the patient have the potential to tolerate intense rehabilitation      Barriers to Discharge        Equipment Recommendations  None recommended by PT    Recommendations for Other Services     Frequency Min 3X/week    Precautions / Restrictions Precautions Precautions: Fall Precaution Comments: pt stated she has variable episodes of dizziness that she thinks has to do with blood pressure changes from dialysis Restrictions Weight Bearing Restrictions: No   Pertinent Vitals/Pain No c/o pain.  Pt reports intermittent dizziness      Mobility  Bed Mobility Bed Mobility: Not assessed Details for Bed Mobility Assistance: pt sitting up in chair Transfers Transfers: Sit to Stand;Stand to Sit Sit to Stand: 4: Min assist Stand to Sit: 4: Min assist Details for Transfer Assistance: extra time. needs cues to push up from armrests Ambulation/Gait Ambulation/Gait Assistance: 4: Min guard Ambulation Distance (Feet): 100 Feet Assistive device: Rolling walker Ambulation/Gait Assistance Details: assist for safety ..pt states her knees feel like they might buckle Gait Pattern: Step-through pattern Gait velocity: very slow, standing rest breaks General Gait Details: pt uses RW for stability.  She walks slowly and has c/o dizziness, blurred  vision, LE weakness Stairs: No Wheelchair Mobility Wheelchair Mobility: No    Exercises General Exercises - Lower Extremity Gluteal Sets: AROM;Both;10 reps;Standing Long Arc Quad: AROM;Both;5 reps;Seated Hip Flexion/Marching: AROM;Both;10 reps;Seated (with back up straight away form back of chair) Other Exercises Other Exercises: pt instructed to do short bouts of exercise frequently Other Exercises: abd sets Other Exercises: repeated sit to stand Other Exercises: bilateral UE hip to hip in sitting and standing   PT Diagnosis: Difficulty walking;Abnormality of gait;Generalized weakness  PT Problem List: Decreased balance;Decreased activity tolerance;Impaired sensation;Decreased strength;Decreased mobility PT Treatment Interventions: DME instruction;Gait training;Therapeutic activities;Therapeutic exercise;Patient/family education   PT Goals Acute Rehab PT Goals PT Goal Formulation: With patient Time For Goal Achievement: 08/14/12 Potential to Achieve Goals: Good Pt will go Sit to Stand: Independently PT Goal: Sit to Stand - Progress: Goal set today Pt will go Stand to Sit: Independently PT Goal: Stand to Sit - Progress: Goal set today Pt will Stand: Independently;3 - 5 min;with no upper extremity support PT Goal: Stand - Progress: Goal set today Pt will Ambulate: >150 feet;with modified independence PT Goal: Ambulate - Progress: Goal set today  Visit Information  Last PT Received On: 07/31/12 Assistance Needed: +1    Subjective Data  Subjective: "Im never alone"  Pt states she has a therapist coming to the house" Patient Stated Goal: to walk without a walker   Prior Functioning  Home Living Lives With: Daughter Available Help at Discharge: Family Type of Home: House Home Access: Level entry Home Layout: One level Home Adaptive Equipment: Wheelchair - manual;Walker - rolling;Shower chair with  back Prior Function Level of Independence: Independent with assistive  device(s) (Just  gotta take my time.  "off balance") Comments: pt states she is not happy because her husband of 50 years is in a nursing home in [place] with dementia and a stroke and she is living here with her daughter Communication Communication: No difficulties    Cognition  Cognition Arousal/Alertness: Awake/alert Behavior During Therapy: WFL for tasks assessed/performed Overall Cognitive Status: Within Functional Limits for tasks assessed    Extremity/Trunk Assessment Right Lower Extremity Assessment RLE ROM/Strength/Tone: WFL for tasks assessed RLE Sensation: History of peripheral neuropathy Left Lower Extremity Assessment LLE Sensation: History of peripheral neuropathy Trunk Assessment Trunk Assessment: Normal   Balance Balance Balance Assessed: Yes Static Sitting Balance Static Sitting - Balance Support: No upper extremity supported;Feet supported Static Sitting - Level of Assistance: 7: Independent Static Standing Balance Static Standing - Balance Support: Bilateral upper extremity supported Static Standing - Level of Assistance: 4: Min assist Static Standing - Comment/# of Minutes: pt stands with backs of legs against chair for supports "I can't stand long"  End of Session PT - End of Session Activity Tolerance: Patient tolerated treatment well Patient left: in chair;with nursing in room Nurse Communication: Mobility status  GP     Donnetta Hail 07/31/2012, 9:55 AM

## 2012-07-31 NOTE — Progress Notes (Signed)
[facility]    Date of Admission:  07/27/2012   Total days of antibiotics 5         Previously on vanco and cefepime    ID: Holly Ellison is a 72 y.o. female  with DM, ESRD on HD (T-Th-S) found to have fevers, encephalopathy likely due to MSSA bacteremia. Unclear source of infection, TTE does not show vegetations.  Principal Problem:   HCAP (healthcare-associated pneumonia) Active Problems:   Diabetes mellitus   Hypertension   COPD (chronic obstructive pulmonary disease)   ESRD on dialysis   Bacteremia due to Staphylococcus aureus    Subjective: Afebrile, denies any complaints. Excited to go home  Medications:  . amLODipine  10 mg Oral QHS  . aspirin EC  81 mg Oral Daily  . atorvastatin  40 mg Oral Daily  .  ceFAZolin (ANCEF) IV  2 g Intravenous Q T,Th,Sat-1800  . clopidogrel  75 mg Oral Daily  . [START ON 08/04/2012] darbepoetin (ARANESP) injection - DIALYSIS  40 mcg Intravenous Q Sat-HD  . doxercalciferol  6 mcg Intravenous Q T,Th,Sa-HD  . heparin subcutaneous  5,000 Units Subcutaneous Q8H  . insulin aspart  0-9 Units Subcutaneous TID WC  . isosorbide mononitrate  30 mg Oral Daily  . metoprolol  50 mg Oral BID  . multivitamin  1 tablet Oral Daily  . pantoprazole  40 mg Oral Daily  . ranolazine  500 mg Oral BID  . sodium chloride  3 mL Intravenous Q12H  . sodium chloride  3 mL Intravenous Q12H    Objective: Vital signs in last 24 hours: Temp:  [97 F (36.1 C)-98.4 F (36.9 C)] 97 F (36.1 C) (06/24 1200) Pulse Rate:  [65-83] 83 (06/24 1600) Resp:  [16-20] 16 (06/24 1212) BP: (121-186)/(48-82) 134/65 mmHg (06/24 1600) SpO2:  [93 %-99 %] 93 % (06/24 1200) Weight:  [166 lb 7.2 oz (75.5 kg)-167 lb 14.4 oz (76.159 kg)] 166 lb 7.2 oz (75.5 kg) (06/24 1200) Constitutional: oriented to person, place, and time. appears well-developed and well-nourished. No distress. Finished HD HENT:  Mouth/Throat: Oropharynx is clear and moist. No  oropharyngeal exudate.  Cardiovascular: Normal rate, regular rhythm and normal heart sounds. Exam reveals no gallop and no friction rub.  No murmur heard.  Pulmonary/Chest: Effort normal and breath sounds normal. No respiratory distress. no wheezes.  Abdominal: Soft. Bowel sounds are normal. exhibits no distension. There is no tenderness.  Lymphadenopathy: no cervical adenopathy.  Skin: Skin is warm and dry. No rash noted. No erythema.   Lab Results  Recent Labs  07/29/12 0530 07/29/12 1150 07/31/12 1135  WBC 7.8  --  7.2  HGB 10.5*  --  10.3*  HCT 32.0*  --  30.8*  NA 135 136 135  K 3.3* 4.1 4.4  CL 96 97 97  CO2 30 31 27   BUN 21 24* 43*  CREATININE 3.34* 3.57* 5.04*   Liver Panel  Recent Labs  07/31/12 1135  PROT 7.2  ALBUMIN 3.0*  AST 30  ALT 54*  ALKPHOS 175*  BILITOT 0.5   Microbiology: 6/23 blood cx NGTD on day 1 Studies/Results: No results found.   Assessment/Plan: 72yo F with DM, ESRD on HD (t-Th-S) found to have fevers, encephalopathy likely due to MSSA complicated bacteremia. Unclear source of infection, TTE does not show vegetations.  - recommend 4 wk course of 2gm IV cefazolin per end of each HD session, using 07/31/12 as day #1 of 28 days. -  antibiotics to end on July 22nd, 2014. - recommend to repeat blood cx at end July to ensure bacteremia clearance once antibiotic has stopped. - will arrange follow up visit at [facility] last week of July to repeat blood cultures and follow up on hospitalizaiton  Holly Ellison, [facility] for Infectious Diseases Cell: [phone] Pager: [phone]  07/31/2012, 4:38 PM

## 2012-08-01 NOTE — Progress Notes (Signed)
Pt discharged to home after visit summary reviewed and pt capable of re verbalizing medications and follow up appointments. Pt remains stable. No signs and symptoms of distress. Educated to return to ER in the event of SOB, dizziness, chest pain, or fainting. Ronrico Dupin, RN   

## 2012-08-05 LAB — CULTURE, BLOOD (ROUTINE X 2): Culture: NO GROWTH

## 2012-08-30 ENCOUNTER — Inpatient Hospital Stay: Payer: Medicare Other | Admitting: Internal Medicine

## 2012-08-30 ENCOUNTER — Telehealth: Payer: Self-pay | Admitting: *Deleted

## 2012-08-30 NOTE — Telephone Encounter (Signed)
Called patient about her no show and had to leave a message for her to call the office and reschedule. Also called her daughters number as we want to make sure the patient is feeling better.

## 2012-10-09 ENCOUNTER — Encounter (HOSPITAL_COMMUNITY): Payer: Self-pay | Admitting: Physical Medicine and Rehabilitation

## 2012-10-09 ENCOUNTER — Emergency Department (HOSPITAL_COMMUNITY)
Admission: EM | Admit: 2012-10-09 | Discharge: 2012-10-09 | Disposition: A | Discharge status: Home / Self Care | Payer: Medicare Other | Attending: Emergency Medicine | Admitting: Emergency Medicine

## 2012-10-09 ENCOUNTER — Emergency Department (HOSPITAL_COMMUNITY): Payer: Medicare Other

## 2012-10-09 DIAGNOSIS — K219 Gastro-esophageal reflux disease without esophagitis: Secondary | ICD-10-CM | POA: Insufficient documentation

## 2012-10-09 DIAGNOSIS — E1149 Type 2 diabetes mellitus with other diabetic neurological complication: Secondary | ICD-10-CM | POA: Insufficient documentation

## 2012-10-09 DIAGNOSIS — I509 Heart failure, unspecified: Secondary | ICD-10-CM | POA: Insufficient documentation

## 2012-10-09 DIAGNOSIS — Z8669 Personal history of other diseases of the nervous system and sense organs: Secondary | ICD-10-CM | POA: Insufficient documentation

## 2012-10-09 DIAGNOSIS — Z794 Long term (current) use of insulin: Secondary | ICD-10-CM | POA: Insufficient documentation

## 2012-10-09 DIAGNOSIS — I252 Old myocardial infarction: Secondary | ICD-10-CM | POA: Insufficient documentation

## 2012-10-09 DIAGNOSIS — R079 Chest pain, unspecified: Secondary | ICD-10-CM

## 2012-10-09 DIAGNOSIS — Z7982 Long term (current) use of aspirin: Secondary | ICD-10-CM | POA: Insufficient documentation

## 2012-10-09 DIAGNOSIS — J441 Chronic obstructive pulmonary disease with (acute) exacerbation: Secondary | ICD-10-CM | POA: Insufficient documentation

## 2012-10-09 DIAGNOSIS — I2510 Atherosclerotic heart disease of native coronary artery without angina pectoris: Secondary | ICD-10-CM | POA: Insufficient documentation

## 2012-10-09 DIAGNOSIS — I120 Hypertensive chronic kidney disease with stage 5 chronic kidney disease or end stage renal disease: Secondary | ICD-10-CM | POA: Insufficient documentation

## 2012-10-09 DIAGNOSIS — Z7902 Long term (current) use of antithrombotics/antiplatelets: Secondary | ICD-10-CM | POA: Insufficient documentation

## 2012-10-09 DIAGNOSIS — N186 End stage renal disease: Secondary | ICD-10-CM | POA: Insufficient documentation

## 2012-10-09 DIAGNOSIS — M1990 Unspecified osteoarthritis, unspecified site: Secondary | ICD-10-CM | POA: Insufficient documentation

## 2012-10-09 DIAGNOSIS — Z951 Presence of aortocoronary bypass graft: Secondary | ICD-10-CM | POA: Insufficient documentation

## 2012-10-09 DIAGNOSIS — Z87891 Personal history of nicotine dependence: Secondary | ICD-10-CM | POA: Insufficient documentation

## 2012-10-09 DIAGNOSIS — G4700 Insomnia, unspecified: Secondary | ICD-10-CM | POA: Insufficient documentation

## 2012-10-09 DIAGNOSIS — Z79899 Other long term (current) drug therapy: Secondary | ICD-10-CM | POA: Insufficient documentation

## 2012-10-09 DIAGNOSIS — E1142 Type 2 diabetes mellitus with diabetic polyneuropathy: Secondary | ICD-10-CM | POA: Insufficient documentation

## 2012-10-09 DIAGNOSIS — Z992 Dependence on renal dialysis: Secondary | ICD-10-CM | POA: Insufficient documentation

## 2012-10-09 LAB — CBC WITH DIFFERENTIAL/PLATELET
Basophils Absolute: 0 10*3/uL (ref 0.0–0.1)
Eosinophils Relative: 2 % (ref 0–5)
HCT: 38.7 % (ref 36.0–46.0)
Hemoglobin: 13.1 g/dL (ref 12.0–15.0)
Lymphocytes Relative: 26 % (ref 12–46)
Lymphs Abs: 2.1 10*3/uL (ref 0.7–4.0)
MCV: 93.3 fL (ref 78.0–100.0)
Monocytes Absolute: 0.5 10*3/uL (ref 0.1–1.0)
Monocytes Relative: 6 % (ref 3–12)
Neutro Abs: 5.1 10*3/uL (ref 1.7–7.7)
RDW: 14.1 % (ref 11.5–15.5)
WBC: 7.8 10*3/uL (ref 4.0–10.5)

## 2012-10-09 LAB — COMPREHENSIVE METABOLIC PANEL
AST: 17 U/L (ref 0–37)
BUN: 38 mg/dL — ABNORMAL HIGH (ref 6–23)
CO2: 25 mEq/L (ref 19–32)
Calcium: 10.3 mg/dL (ref 8.4–10.5)
Chloride: 97 mEq/L (ref 96–112)
Creatinine, Ser: 4.72 mg/dL — ABNORMAL HIGH (ref 0.50–1.10)
GFR calc Af Amer: 10 mL/min — ABNORMAL LOW (ref >90)
GFR calc non Af Amer: 9 mL/min — ABNORMAL LOW (ref >90)
Glucose, Bld: 87 mg/dL (ref 70–99)
Total Bilirubin: 0.6 mg/dL (ref 0.3–1.2)

## 2012-10-09 LAB — GLUCOSE, CAPILLARY: Glucose-Capillary: 93 mg/dL (ref 70–99)

## 2012-10-09 MED ORDER — HEPARIN SODIUM (PORCINE) 1000 UNIT/ML DIALYSIS
1000.0000 [IU] | INTRAMUSCULAR | Status: DC | PRN
  Filled 2012-10-09: qty 1

## 2012-10-09 MED ORDER — SODIUM CHLORIDE 0.9 % IV SOLN: 100.0000 mL | INTRAVENOUS | Status: DC | PRN

## 2012-10-09 MED ORDER — ALTEPLASE 2 MG IJ SOLR
2.0000 mg | Freq: Once | INTRAMUSCULAR | Status: DC | PRN
  Filled 2012-10-09: qty 2

## 2012-10-09 MED ORDER — DOXERCALCIFEROL 4 MCG/2ML IV SOLN: 4.0000 ug | INTRAVENOUS | Status: DC

## 2012-10-09 MED ORDER — HEPARIN SODIUM (PORCINE) 1000 UNIT/ML DIALYSIS
20.0000 [IU]/kg | INTRAMUSCULAR | Status: DC | PRN
  Filled 2012-10-09: qty 2

## 2012-10-09 MED ORDER — SODIUM CHLORIDE 0.9 % IV SOLN: 125.0000 mg | INTRAVENOUS | Status: DC

## 2012-10-09 MED ORDER — LIDOCAINE HCL (PF) 1 % IJ SOLN: 5.0000 mL | INTRAMUSCULAR | Status: DC | PRN

## 2012-10-09 MED ORDER — LIDOCAINE-PRILOCAINE 2.5-2.5 % EX CREA: 1.0000 "application " | TOPICAL_CREAM | CUTANEOUS | Status: DC | PRN

## 2012-10-09 MED ORDER — NEPRO/CARBSTEADY PO LIQD
237.0000 mL | ORAL | Status: DC | PRN
  Filled 2012-10-09: qty 237

## 2012-10-09 MED ORDER — PENTAFLUOROPROP-TETRAFLUOROETH EX AERO
1.0000 "application " | INHALATION_SPRAY | CUTANEOUS | Status: DC | PRN
  Filled 2012-10-09: qty 103.5

## 2012-10-09 NOTE — Consult Note (Signed)
Referring Provider: No ref. provider found Primary Care Physician:  Lucianne Lei, MD Primary Nephrologist:  Dr. Hyman Hopes  Reason for Consultation: Medical management of ESRD, Anemia and secondary hyperparathyroidism  HPI: Patient with past medical history significant for end-stage renal disease on hemodialysis,recieves dialysis in [facility] TTS, she also has a significant history of  coronary artery disease with stent, diabetes, and hypertension. She presented today with complaints of weakness, shortness of breath, and intermittent chest discomfort for the past week.  While at dialysis she told the staff that she was having dyspnea on exertion and conversation as well as central chest discomfort. She was then sent to the ER for further workup. She denies to me that she is above her dry weight and denies any excessive fluid intake.   Past Medical History  Diagnosis Date  . Chronic kidney disease     just found out? Placed graft in NOv-Dr. Orvan Falconer in [facility].  started dialysis in 06/2011  . Arthritis     Osteoarthritis  . Neuropathy in diabetes   . Insomnia   . Asthma   . CAD (coronary artery disease) 2012    7 stents-First PCI was in [place].  The last time she had a procedure was  a stent last year, thena  CABG was undertaken in October [facility])  . GERD (gastroesophageal reflux disease)   . Diabetes mellitus     Type II  . COPD (chronic obstructive pulmonary disease)   . Hypertension   . Peripheral vascular disease     endarterectomy  . Anemia   . CHF (congestive heart failure)   . Coronary artery dilation   . Myocardial infarction 2013    x 3  . 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 Abdominal hysterectomy    . Coronary artery bypass graft  2012    7 stents  . Cea      Left  . Carpal tunnel release    . Av fistula placement    . Grave's disease      ? unclea rif ca or not     Prior  to Admission medications   Medication Sig Start Date End Date Taking? Authorizing Provider  albuterol (PROVENTIL HFA;VENTOLIN HFA) 108 (90 BASE) MCG/ACT inhaler Inhale 2 puffs into the lungs every 6 (six) hours as needed for wheezing.   Yes Historical Provider, MD  aspirin EC 81 MG EC tablet Take 1 tablet (81 mg total) by mouth daily. 07/31/12  Yes Renae Fickle, MD  atorvastatin (LIPITOR) 40 MG tablet Take 40 mg by mouth daily.   Yes Historical Provider, MD  clopidogrel (PLAVIX) 75 MG tablet Take 1 tablet (75 mg total) by mouth daily. 11/02/11  Yes Laveda Norman, MD  gabapentin (NEURONTIN) 100 MG capsule Take 100 mg by mouth 3 (three) times daily.   Yes Historical Provider, MD  insulin aspart (NOVOLOG) 100 unit/mL injection Inject 2 Units into the skin 2 (two) times daily with a meal. Sliding scale: No units if <120, 3 units if 120-150, 4 if 150-180   Yes Historical Provider, MD  methimazole (TAPAZOLE) 10 MG tablet Take 10 mg by mouth daily.   Yes Historical Provider, MD  metolazone (ZAROXOLYN) 2.5 MG tablet Take 2.5 mg by mouth daily as needed.    Yes Historical Provider, MD  metoprolol (LOPRESSOR) 100 MG tablet Take 50 mg by mouth 2 (two) times daily.  Yes Historical Provider, MD  Oxycodone HCl 10 MG TABS Take 10 mg by mouth 3 (three) times daily as needed (Pain).   Yes Historical Provider, MD  pantoprazole (PROTONIX) 40 MG tablet Take 40 mg by mouth daily.   Yes Historical Provider, MD  ranolazine (RANEXA) 500 MG 12 hr tablet Take 500 mg by mouth 2 (two) times daily.   Yes Historical Provider, MD    Current Facility-Administered Medications  Medication Dose Route Frequency Provider Last Rate Last Dose  . [START ON 10/11/2012] doxercalciferol (HECTOROL) injection 4 mcg  4 mcg Intravenous Q T,Th,Sa-HD [place] Slider, PA-C      . [START ON 10/11/2012] ferric gluconate (NULECIT) 125 mg in sodium chloride 0.9 % 100 mL IVPB  125 mg Intravenous Q Thu-HD Sheffield Slider, PA-C       Current  Outpatient Prescriptions  Medication Sig Dispense Refill  . albuterol (PROVENTIL HFA;VENTOLIN HFA) 108 (90 BASE) MCG/ACT inhaler Inhale 2 puffs into the lungs every 6 (six) hours as needed for wheezing.      Marland Kitchen aspirin EC 81 MG EC tablet Take 1 tablet (81 mg total) by mouth daily.      Marland Kitchen atorvastatin (LIPITOR) 40 MG tablet Take 40 mg by mouth daily.      . clopidogrel (PLAVIX) 75 MG tablet Take 1 tablet (75 mg total) by mouth daily.  30 tablet  0  . gabapentin (NEURONTIN) 100 MG capsule Take 100 mg by mouth 3 (three) times daily.      . insulin aspart (NOVOLOG) 100 unit/mL injection Inject 2 Units into the skin 2 (two) times daily with a meal. Sliding scale: No units if <120, 3 units if 120-150, 4 if 150-180      . methimazole (TAPAZOLE) 10 MG tablet Take 10 mg by mouth daily.      . metolazone (ZAROXOLYN) 2.5 MG tablet Take 2.5 mg by mouth daily as needed.       . metoprolol (LOPRESSOR) 100 MG tablet Take 50 mg by mouth 2 (two) times daily.      . Oxycodone HCl 10 MG TABS Take 10 mg by mouth 3 (three) times daily as needed (Pain).      . pantoprazole (PROTONIX) 40 MG tablet Take 40 mg by mouth daily.      . ranolazine (RANEXA) 500 MG 12 hr tablet Take 500 mg by mouth 2 (two) times daily.        Allergies as of 10/09/2012 - Review Complete 10/09/2012  Allergen Reaction Noted  . Dilaudid [hydromorphone hcl] Itching 05/17/2011  . Morphine sulfate Itching 05/17/2011  . Opium Itching 05/17/2011  . Ciprocin-fluocin-procin [fluocinolone acetonide] Itching 05/17/2011    Family History  Problem Relation Age of Onset  . Diabetes type II      History   Social History  . Marital Status: Married    Spouse Name: N/A    Number of Children: N/A  . Years of Education: N/A   Occupational History  . Not on file.   Social History Main Topics  . Smoking status: Former Smoker    Types: Cigarettes    Quit date: 05/24/1996  . Smokeless tobacco: Never Used  . Alcohol Use: No  . Drug Use: No  .  Sexual Activity: Not on file   Other Topics Concern  . Not on file   Social History Narrative   Lives with Daughter in [place]    Husband has had a CVa and lives in a  [place]    Review of Systems: Gen: Denies any fever, chills, sweats, anorexia, fatigue, weakness, malaise, weight loss, and sleep disorder HEENT: No visual complaints, No history of Retinopathy. Normal external appearance No Epistaxis or Sore throat. No sinusitis.   CV: Denies chest pain, angina, palpitations, syncope, orthopnea, PND, peripheral edema, and claudication. Resp: Denies dyspnea at rest, dyspnea with exercise, cough, sputum, wheezing, coughing up blood, and pleurisy. GI: Denies vomiting blood, jaundice, and fecal incontinence.   Denies dysphagia or odynophagia. GU : Denies urinary burning, blood in urine, urinary frequency, urinary hesitancy, nocturnal urination, and urinary incontinence.  No renal calculi. MS: Denies joint pain, limitation of movement, and swelling, stiffness, low back pain, extremity pain. Denies muscle weakness, cramps, atrophy.  No use of non steroidal antiinflammatory drugs. Derm: Denies rash, itching, dry skin, hives, moles, warts, or unhealing ulcers.  Psych: Denies depression, anxiety, memory loss, suicidal ideation, hallucinations, paranoia, and confusion. Heme: Denies bruising, bleeding, and enlarged lymph nodes. Neuro: No headache.  No diplopia. No dysarthria.  No dysphasia.  No history of CVA.  No Seizures. No paresthesias.  No weakness. Endocrine No DM.  No Thyroid disease.  No Adrenal disease.  Physical Exam: Vital signs in last 24 hours: Temp:  [98.9 F (37.2 C)] 98.9 F (37.2 C) (09/02 1210) Pulse Rate:  [77-85] 85 (09/02 1515) Resp:  [18] 18 (09/02 1438) BP: (117-195)/(61-87) 195/87 mmHg (09/02 1515) SpO2:  [100 %] 100 % (09/02 1515) Weight:  [77.111 kg (170 lb)] 77.111 kg (170 lb) (09/02 1210)   General: Ill appearing Afebrile Head:  Normocephalic and  atraumatic. Eyes:  Sclera clear, no icterus.   Conjunctiva pink. Ears:  Normal auditory acuity. Nose:  No deformity, discharge,  or lesions. Mouth:  No deformity or lesions, dentition normal. Neck:  Supple; no masses or thyromegaly. JVP elevated  Lungs:  Diminished breath sounds and rales on the right  Heart:  Regular rate and rhythm; no murmurs, clicks, rubs,  or gallops. Abdomen:  Soft, nontender and nondistended. No masses, hepatosplenomegaly or hernias noted. Normal bowel sounds, without guarding, and without rebound.   Msk:  Symmetrical without gross deformities. Normal posture. Pulses:  No carotid, renal, femoral bruits. DP and PT symmetrical and equal Extremities:  Without clubbing or edema. Neurologic:  Alert and  oriented x4;  grossly normal neurologically. Skin:  Intact without significant lesions or rashes. Cervical Nodes:  No significant cervical adenopathy. Psych:  Alert and cooperative. Normal mood and affect.  Intake/Output from previous day:   Intake/Output this shift:    Lab Results:  Recent Labs  10/09/12 1417  WBC 7.8  HGB 13.1  HCT 38.7  PLT 267   BMET  Recent Labs  10/09/12 1417  NA 140  K 3.9  CL 97  CO2 25  GLUCOSE 87  BUN 38*  CREATININE 4.72*  CALCIUM 10.3   LFT  Recent Labs  10/09/12 1417  PROT 8.1  ALBUMIN 4.0  AST 17  ALT 11  ALKPHOS 84  BILITOT 0.6   PT/INR No results found for this basename: LABPROT, INR,  in the last 72 hours Hepatitis Panel No results found for this basename: HEPBSAG, HCVAB, HEPAIGM, HEPBIGM,  in the last 72 hours  Studies/Results: Dg Chest 2 View  10/09/2012   CLINICAL DATA:  72 year old female shortness of breath and chest pain.  EXAM: CHEST  2 VIEW  COMPARISON:  07/28/2012 and earlier.  FINDINGS: Semi upright AP and lateral views of the chest at 1355 hrs. Stable cardiomegaly  and mediastinal contours. Sequelae of CABG. Stable lung volumes with no pneumothorax, pulmonary edema, pleural effusion or  confluent pulmonary opacity. No acute osseous abnormality identified.  IMPRESSION: Stable cardiomegaly. No acute cardiopulmonary abnormality.   Electronically Signed   By: Augusto Gamble   On: 10/09/2012 14:05    Assessment/Plan:  ESRD- Plan dialysis with volume removal this afternoon. TTS [place]  ANEMIA-At goal no ESA  MBD-stable low phos diet, binders and Vitamin d  HTN/VOL-marked volume overload plan fluid removal  ACCESS-AVF  OTHER- cycle cardiac enzymes and challenge EDW. Stop Metolazone   LOS: 0 Lavene Penagos W @TODAY @4 :06 PM

## 2012-10-09 NOTE — ED Notes (Signed)
Pt resting quietly at the time. Denies chest pain. Resting quietly at the time. Vital signs stable. Pt remains on cardiac monitor. NAD.

## 2012-10-09 NOTE — Progress Notes (Signed)
Hemodialysis Tx done. Signed off early AMA. Dr. Eliott Nine aware. Unable to meet 3L goal due to pt frequently feeling weak and BP decreasing. Pt discharged from hospital after her hemodialysis Tx in the care of her son.

## 2012-10-09 NOTE — ED Notes (Signed)
Pt presents to department via [facility] for evaluation of midsternal non radiating chest pressure. Onset Monday, became worse today. 7/10 pain upon arrival. Received 324 ASA and (1) sublingual nitro. No relief of chest pain. Wears O2 at home as needed. Pt is conscious alert and oriented x4. 20g RAC. Fistula to L upper arm, did not go to HD today.

## 2012-10-09 NOTE — ED Notes (Signed)
Ordered patient a renal diet tray.

## 2012-10-09 NOTE — ED Provider Notes (Signed)
CSN: [id number]     Arrival date & time 10/09/12  1201 History   First MD Initiated Contact with Patient 10/09/12 1210     Chief Complaint  Patient presents with  . Chest Pain  . Shortness of Breath   (Consider location/radiation/quality/duration/timing/severity/associated sxs/prior Treatment) HPI Comments: Patient with past medical history significant for end-stage renal disease on hemodialysis, coronary artery disease with stent, diabetes, and hypertension. She presented today with complaints of weakness, shortness of breath, and intermittent chest discomfort for the past week. She was due to be dialyzed today however began to experience another episode of discomfort while she was there. She was then sent here for further workup. She denies to me that she is above her dry weight and denies any excessive fluid intake.  Patient is a 72 y.o. female presenting with chest pain and shortness of breath. The history is provided by the patient.  Chest Pain Pain location:  Substernal area Pain quality: tightness   Pain radiates to:  Does not radiate Pain radiates to the back: no   Pain severity:  Moderate Onset quality:  Gradual Timing:  Intermittent Progression:  Worsening Chronicity:  New Context: not breathing   Relieved by:  Nothing Worsened by:  Nothing tried Associated symptoms: fatigue and shortness of breath   Associated symptoms: no fever and no palpitations   Shortness of Breath Associated symptoms: chest pain   Associated symptoms: no fever     Past Medical History  Diagnosis Date  . Chronic kidney disease     just found out? Placed graft in NOv-Dr. Orvan Falconer in [facility].  started dialysis in 06/2011  . Arthritis     Osteoarthritis  . Neuropathy in diabetes   . Insomnia   . Asthma   . CAD (coronary artery disease) 2012    7 stents-First PCI was in [place].  The last time she had a procedure was  a stent last year, thena  CABG was undertaken in October [facility])   . GERD (gastroesophageal reflux disease)   . Diabetes mellitus     Type II  . COPD (chronic obstructive pulmonary disease)   . Hypertension   . Peripheral vascular disease     endarterectomy  . Anemia   . CHF (congestive heart failure)   . Coronary artery dilation   . Myocardial infarction 2013    x 3  . 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 Abdominal hysterectomy    . Coronary artery bypass graft  2012    7 stents  . Cea      Left  . Carpal tunnel release    . Av fistula placement    . Grave's disease      ? unclea rif ca or not    Family History  Problem Relation Age of Onset  . Diabetes type II     History  Substance Use Topics  . Smoking status: Former Smoker    Types: Cigarettes    Quit date: 05/24/1996  . Smokeless tobacco: Never Used  . Alcohol Use: No   OB History   Grav Para Term Preterm Abortions TAB SAB Ect Mult Living                 Review of Systems  Constitutional: Positive for fatigue. Negative for fever and chills.  Respiratory: Positive for chest tightness and shortness of breath.   Cardiovascular: Positive  for chest pain. Negative for palpitations and leg swelling.  All other systems reviewed and are negative.    Allergies  Dilaudid; Morphine sulfate; Opium; and Ciprocin-fluocin-procin  Home Medications   Current Outpatient Rx  Name  Route  Sig  Dispense  Refill  . amLODipine (NORVASC) 10 MG tablet   Oral   Take 1 tablet (10 mg total) by mouth at bedtime.   30 tablet   0   . aspirin EC 81 MG EC tablet   Oral   Take 1 tablet (81 mg total) by mouth daily.         Marland Kitchen atorvastatin (LIPITOR) 40 MG tablet   Oral   Take 40 mg by mouth daily.         . clopidogrel (PLAVIX) 75 MG tablet   Oral   Take 1 tablet (75 mg total) by mouth daily.   30 tablet   0   . gabapentin (NEURONTIN) 100 MG capsule   Oral   Take 100 mg by mouth 3 (three) times daily.          . insulin aspart (NOVOLOG) 100 unit/mL injection   Subcutaneous   Inject 2 Units into the skin 2 (two) times daily with a meal. Sliding scale: No units if <120, 3 units if 120-150, 4 if 150-180         . isosorbide mononitrate (IMDUR) 30 MG 24 hr tablet   Oral   Take 1 tablet (30 mg total) by mouth daily.   30 tablet   0   . methimazole (TAPAZOLE) 10 MG tablet   Oral   Take 10 mg by mouth daily.         . metolazone (ZAROXOLYN) 2.5 MG tablet   Oral   Take 2.5 mg by mouth daily as needed.         . metoprolol (LOPRESSOR) 100 MG tablet   Oral   Take 50 mg by mouth 2 (two) times daily.         . Nutritional Supplements (FEEDING SUPPLEMENT, NEPRO CARB STEADY,) LIQD   Oral   Take 237 mLs by mouth as needed (missed meal during dialysis.).   30 Can   0   . Oxycodone HCl 10 MG TABS   Oral   Take 10 mg by mouth 3 (three) times daily as needed (Pain).         . pantoprazole (PROTONIX) 40 MG tablet   Oral   Take 40 mg by mouth daily.         . promethazine (PHENERGAN) 12.5 MG tablet   Oral   Take 1 tablet (12.5 mg total) by mouth every 6 (six) hours as needed for nausea.   30 tablet   0   . ranolazine (RANEXA) 500 MG 12 hr tablet   Oral   Take 500 mg by mouth 2 (two) times daily.          BP 117/61  Pulse 77  Temp(Src) 98.9 F (37.2 C) (Oral)  Resp 18  Ht 5\' 6"  (1.676 m)  Wt 170 lb (77.111 kg)  BMI 27.45 kg/m2  SpO2 100% Physical Exam  Nursing note and vitals reviewed. Constitutional: She is oriented to person, place, and time. She appears well-developed and well-nourished. No distress.  HENT:  Head: Normocephalic and atraumatic.  Mouth/Throat: Oropharynx is clear and moist.  Neck: Normal range of motion. Neck supple.  Cardiovascular: Normal rate and regular rhythm.  Exam reveals no gallop and no friction rub.  No murmur heard. Pulmonary/Chest: Effort normal and breath sounds normal. No respiratory distress. She has no wheezes.  Abdominal: Soft.  Bowel sounds are normal. She exhibits no distension. There is no tenderness.  Musculoskeletal: Normal range of motion. She exhibits no edema.  Neurological: She is alert and oriented to person, place, and time.  Skin: Skin is warm and dry. She is not diaphoretic.    ED Course  Procedures (including critical care time) Labs Review Labs Reviewed  CBC WITH DIFFERENTIAL  COMPREHENSIVE METABOLIC PANEL  TROPONIN I   Imaging Review No results found.   Date: 10/09/2012  Rate: 77  Rhythm: normal sinus rhythm  QRS Axis: left  Intervals: normal  ST/T Wave abnormalities: nonspecific T wave changes  Conduction Disutrbances:none  Narrative Interpretation:   Old EKG Reviewed: unchanged    MDM  No diagnosis found. This patient is a 72 year old female with past medical history significant for end-stage renal disease for which he is on hemodialysis. She became weak starting yesterday and was unable to complete her dialysis today. She presents here feeling short of breath and weak. Workup was essentially unremarkable including chest x-ray and laboratory studies. She is not hyperkalemic and she does not appear to be in frank congestive heart failure. It has been 3 days since her last dialysis and I feel as though this is likely why she is feeling badly. She was seen by nephrology and arrangements are being made for her to get her dialysis here. After this is complete I feels that she is stable for discharge. She will be sent to the dialysis department for treatment.    Geoffery Lyons, MD 10/09/12 [phone]

## 2012-10-09 NOTE — ED Notes (Signed)
Pt transported to dialysis

## 2013-01-21 ENCOUNTER — Other Ambulatory Visit: Payer: Self-pay | Admitting: *Deleted

## 2013-01-21 DIAGNOSIS — I739 Peripheral vascular disease, unspecified: Secondary | ICD-10-CM

## 2013-01-30 ENCOUNTER — Ambulatory Visit: Payer: Medicare Other | Admitting: Vascular Surgery

## 2013-01-30 ENCOUNTER — Other Ambulatory Visit (HOSPITAL_COMMUNITY): Payer: Medicare Other

## 2013-01-30 ENCOUNTER — Encounter (HOSPITAL_COMMUNITY): Payer: Medicare Other

## 2013-02-06 ENCOUNTER — Ambulatory Visit: Payer: Self-pay

## 2013-02-08 ENCOUNTER — Other Ambulatory Visit: Payer: Self-pay | Admitting: Vascular Surgery

## 2013-02-08 DIAGNOSIS — I739 Peripheral vascular disease, unspecified: Secondary | ICD-10-CM

## 2013-02-08 DIAGNOSIS — L98499 Non-pressure chronic ulcer of skin of other sites with unspecified severity: Secondary | ICD-10-CM

## 2013-02-08 DIAGNOSIS — Z48812 Encounter for surgical aftercare following surgery on the circulatory system: Secondary | ICD-10-CM

## 2013-02-13 ENCOUNTER — Ambulatory Visit (INDEPENDENT_AMBULATORY_CARE_PROVIDER_SITE_OTHER): Payer: Medicare Other

## 2013-02-13 VITALS — BP 96/66 | HR 59 | Resp 18

## 2013-02-13 DIAGNOSIS — E1140 Type 2 diabetes mellitus with diabetic neuropathy, unspecified: Secondary | ICD-10-CM

## 2013-02-13 DIAGNOSIS — E1149 Type 2 diabetes mellitus with other diabetic neurological complication: Secondary | ICD-10-CM

## 2013-02-13 DIAGNOSIS — L608 Other nail disorders: Secondary | ICD-10-CM

## 2013-02-13 DIAGNOSIS — E1142 Type 2 diabetes mellitus with diabetic polyneuropathy: Secondary | ICD-10-CM

## 2013-02-13 DIAGNOSIS — L97509 Non-pressure chronic ulcer of other part of unspecified foot with unspecified severity: Secondary | ICD-10-CM

## 2013-02-13 MED ORDER — SILVER SULFADIAZINE 1 % EX CREA: 1.0000 "application " | TOPICAL_CREAM | Freq: Every day | CUTANEOUS | Status: DC

## 2013-02-13 NOTE — Patient Instructions (Signed)
Instructions for Wound Care  The most important step to healing a foot wound is to reduce the pressure on your foot - it is extremely important to stay off your foot as much as possible and wear the shoe/boot as instructed.  Cleanse your foot with saline wash or warm soapy water (dial antibacterial soap or similar).  Blot dry.  Apply prescribed medication to your wound and cover with gauze and a bandage.  May hold bandage in place with Coban (self sticky wrap), Ace bandage or tape.  You may find dressing supplies at your local [employer], [employer], drug store or medical supply store.  Your prescribed topical medication is :  Silvadene Cream (twice daily) apply Silvadene once daily with dressing changes after cleansing the foot thoroughly.  Obtain Silvadene at pharmacy wear was prescribed start applying daily and maintain Silvadene dressings daily until instructed to discontinue.  If you notice any foul odor, increase in pain, pus, increased swelling, red streaks or generalized redness occurring in your foot or leg-Call our office immediately to be seen.  This may be a sign of a limb or life threatening infection that will need prompt attention.  Harriet Masson, [place]

## 2013-02-13 NOTE — Progress Notes (Signed)
   Subjective:    Patient ID: Holly Ellison, female    DOB: 1941/01/16, 73 y.o.   MRN: [id number]  HPI trim my nails and there is some places on my feet that need to be trimmed and toes hurting on right foot    Review of Systems  Constitutional: Negative.   Eyes: Negative.   Respiratory: Negative.   Cardiovascular: Negative.   Gastrointestinal: Negative.   Endocrine: Negative.   Genitourinary: Negative.   Musculoskeletal: Negative.   Skin: Positive for wound.  Allergic/Immunologic: Negative.   Neurological: Positive for numbness.  Hematological: Negative.   Psychiatric/Behavioral: Negative.   All other systems reviewed and are negative.       Objective:   Physical Exam Vascular status is diminished with thready pedal pulses DP plus one over 4 bilateral PT nonpalpable bilateral Refill timed 4 seconds all digits temperature warm to cool turgor diminished no edema noted neurologically epicritic and proprioceptive sensations grossly diminished on Semmes Weinstein testing to forefoot digits and plantar arch. Dermatologically skin color pigment normal hair growth absent nails criptotic friable discolored and brittle one through 5 bilateral. There is also hemorrhage a keratoses with ulceration under the keratoses sub-first MTP area left foot this has not been debrided in a prolonged period of time. Patient wearing diabetic shoes however the ulceration on debridement is noted to be about a centimeter in diameter with a hemorrhage a keratosis around 2 cm in diameter sub-first MTP area left. There is no ascending cellulitis or lymphangitis patient is afebrile       Assessment & Plan:  Assessment this time diabetes with neuropathy. Dystrophic friable gratified thick nails debrided x10 the presence of diabetes and complications. Also this time debridement keratoses which revealed all underlying ulceration 1 cm in diameter down to subcutaneous tissue level sub-first left. At this time  prescription for Silvadene is given instructions for wound care with Silvadene gauze dressing changes daily at this time Silvadene gauze dressing is applied. Reappointed in 2 weeks for followup and reevaluation of ulcer and will do 3 month followup for palliative nail care in the future as needed.  Harriet Masson DPM

## 2013-02-27 ENCOUNTER — Other Ambulatory Visit (HOSPITAL_COMMUNITY): Payer: Medicare Other

## 2013-02-27 ENCOUNTER — Ambulatory Visit: Payer: Medicare Other | Admitting: Vascular Surgery

## 2013-02-27 ENCOUNTER — Encounter (HOSPITAL_COMMUNITY): Payer: Medicare Other

## 2013-03-06 ENCOUNTER — Ambulatory Visit (INDEPENDENT_AMBULATORY_CARE_PROVIDER_SITE_OTHER): Payer: Medicare Other

## 2013-03-06 VITALS — BP 129/63 | HR 65 | Resp 18

## 2013-03-06 DIAGNOSIS — L97509 Non-pressure chronic ulcer of other part of unspecified foot with unspecified severity: Secondary | ICD-10-CM

## 2013-03-06 DIAGNOSIS — E1149 Type 2 diabetes mellitus with other diabetic neurological complication: Secondary | ICD-10-CM

## 2013-03-06 DIAGNOSIS — G629 Polyneuropathy, unspecified: Secondary | ICD-10-CM

## 2013-03-06 DIAGNOSIS — G609 Hereditary and idiopathic neuropathy, unspecified: Secondary | ICD-10-CM

## 2013-03-06 DIAGNOSIS — E1142 Type 2 diabetes mellitus with diabetic polyneuropathy: Secondary | ICD-10-CM

## 2013-03-06 DIAGNOSIS — E1140 Type 2 diabetes mellitus with diabetic neuropathy, unspecified: Secondary | ICD-10-CM

## 2013-03-06 NOTE — Progress Notes (Signed)
   Subjective:    Patient ID: Holly Ellison, female    DOB: [birth year], 73 y.o.   MRN: [id number]  HPI Son states that he was putting on the cream and it was closing up on the ball of her left foot and no draining    Review of Systems none changes or findings on review     Objective:   Physical Exam Vascular status is intact although thready PT pulse DP plus one over 4 PT nonpalpable bilateral the ulcer site sub-first MTP her left his result at this time there still some hemorrhage keratoses maceration and some dried blood down to dermal level does not go full thickness as it did last week. Orthopedic biomechanical exam unchanged is digital contractures plantar flexed metatarsal. The there is no edema no erythema no increased temperature no secondary infections identified at this time.       Assessment & Plan:  Assessment this time his diabetes with peripheral neuropathy and angiopathy and history of ulceration sub-1 left ulcerations improved down to dermal level only this is debrided again at today's visit down to dermal level Silvadene and gauze dressing applied maintain Silvadene gauze dressing for another 5-7 days and then if no drainage is noted may discontinue maintain diabetic shoes and socks at all times or crocs and socks at all times. Reappointed 2 months for long-term followup and debridement patient also requesting a second pair of diabetic shoes we'll obtain authorization for shoes again and patient's behalf wet solution benefit from additional shoes the site a high top when she got  Harriet Masson DPM

## 2013-03-06 NOTE — Patient Instructions (Signed)
Diabetes and Foot Care Diabetes may cause you to have problems because of poor blood supply (circulation) to your feet and legs. This may cause the skin on your feet to become thinner, break easier, and heal more slowly. Your skin may become dry, and the skin may peel and crack. You may also have nerve damage in your legs and feet causing decreased feeling in them. You may not notice minor injuries to your feet that could lead to infections or more serious problems. Taking care of your feet is one of the most important things you can do for yourself.  HOME CARE INSTRUCTIONS  Wear shoes at all times, even in the house. Do not go barefoot. Bare feet are easily injured.  Check your feet daily for blisters, cuts, and redness. If you cannot see the bottom of your feet, use a mirror or ask someone for help.  Wash your feet with warm water (do not use hot water) and mild soap. Then pat your feet and the areas between your toes until they are completely dry. Do not soak your feet as this can dry your skin.  Apply a moisturizing lotion or petroleum jelly (that does not contain alcohol and is unscented) to the skin on your feet and to dry, brittle toenails. Do not apply lotion between your toes.  Trim your toenails straight across. Do not dig under them or around the cuticle. File the edges of your nails with an emery board or nail file.  Do not cut corns or calluses or try to remove them with medicine.  Wear clean socks or stockings every day. Make sure they are not too tight. Do not wear knee-high stockings since they may decrease blood flow to your legs.  Wear shoes that fit properly and have enough cushioning. To break in new shoes, wear them for just a few hours a day. This prevents you from injuring your feet. Always look in your shoes before you put them on to be sure there are no objects inside.  Do not cross your legs. This may decrease the blood flow to your feet.  If you find a minor scrape,  cut, or break in the skin on your feet, keep it and the skin around it clean and dry. These areas may be cleansed with mild soap and water. Do not cleanse the area with peroxide, alcohol, or iodine.  When you remove an adhesive bandage, be sure not to damage the skin around it.  If you have a wound, look at it several times a day to make sure it is healing.  Do not use heating pads or hot water bottles. They may burn your skin. If you have lost feeling in your feet or legs, you may not know it is happening until it is too late.  Make sure your health care provider performs a complete foot exam at least annually or more often if you have foot problems. Report any cuts, sores, or bruises to your health care provider immediately. SEEK MEDICAL CARE IF:   You have an injury that is not healing.  You have cuts or breaks in the skin.  You have an ingrown nail.  You notice redness on your legs or feet.  You feel burning or tingling in your legs or feet.  You have pain or cramps in your legs and feet.  Your legs or feet are numb.  Your feet always feel cold. SEEK IMMEDIATE MEDICAL CARE IF:   There is increasing redness,   swelling, or pain in or around a wound.  There is a red line that goes up your leg.  Pus is coming from a wound.  You develop a fever or as directed by your health care provider.  You notice a bad smell coming from an ulcer or wound. Document Released: 01/22/2000 Document Revised: 09/26/2012 Document Reviewed: 07/03/2012 [employer] Patient Information 2014 [employer].  

## 2013-03-25 ENCOUNTER — Encounter: Payer: Self-pay | Admitting: Vascular Surgery

## 2013-03-26 ENCOUNTER — Encounter: Payer: Self-pay | Admitting: Vascular Surgery

## 2013-03-27 ENCOUNTER — Inpatient Hospital Stay (HOSPITAL_COMMUNITY): Admission: RE | Admit: 2013-03-27 | Payer: Medicare Other | Source: Ambulatory Visit

## 2013-03-27 ENCOUNTER — Ambulatory Visit: Payer: Medicare Other | Admitting: Vascular Surgery

## 2013-04-23 ENCOUNTER — Encounter: Payer: Self-pay | Admitting: Vascular Surgery

## 2013-04-24 ENCOUNTER — Ambulatory Visit: Payer: Medicare Other | Admitting: Vascular Surgery

## 2013-04-24 ENCOUNTER — Other Ambulatory Visit (HOSPITAL_COMMUNITY): Payer: Medicare Other

## 2013-04-24 ENCOUNTER — Encounter (HOSPITAL_COMMUNITY): Payer: Medicare Other

## 2013-05-01 ENCOUNTER — Ambulatory Visit (INDEPENDENT_AMBULATORY_CARE_PROVIDER_SITE_OTHER): Payer: Medicare Other

## 2013-05-01 VITALS — BP 118/54 | HR 79 | Resp 18

## 2013-05-01 DIAGNOSIS — E1140 Type 2 diabetes mellitus with diabetic neuropathy, unspecified: Secondary | ICD-10-CM

## 2013-05-01 DIAGNOSIS — E1142 Type 2 diabetes mellitus with diabetic polyneuropathy: Secondary | ICD-10-CM

## 2013-05-01 DIAGNOSIS — L608 Other nail disorders: Secondary | ICD-10-CM

## 2013-05-01 DIAGNOSIS — E1149 Type 2 diabetes mellitus with other diabetic neurological complication: Secondary | ICD-10-CM

## 2013-05-01 DIAGNOSIS — Q828 Other specified congenital malformations of skin: Secondary | ICD-10-CM

## 2013-05-01 NOTE — Patient Instructions (Signed)
Diabetes and Foot Care Diabetes may cause you to have problems because of poor blood supply (circulation) to your feet and legs. This may cause the skin on your feet to become thinner, break easier, and heal more slowly. Your skin may become dry, and the skin may peel and crack. You may also have nerve damage in your legs and feet causing decreased feeling in them. You may not notice minor injuries to your feet that could lead to infections or more serious problems. Taking care of your feet is one of the most important things you can do for yourself.  HOME CARE INSTRUCTIONS  Wear shoes at all times, even in the house. Do not go barefoot. Bare feet are easily injured.  Check your feet daily for blisters, cuts, and redness. If you cannot see the bottom of your feet, use a mirror or ask someone for help.  Wash your feet with warm water (do not use hot water) and mild soap. Then pat your feet and the areas between your toes until they are completely dry. Do not soak your feet as this can dry your skin.  Apply a moisturizing lotion or petroleum jelly (that does not contain alcohol and is unscented) to the skin on your feet and to dry, brittle toenails. Do not apply lotion between your toes.  Trim your toenails straight across. Do not dig under them or around the cuticle. File the edges of your nails with an emery board or nail file.  Do not cut corns or calluses or try to remove them with medicine.  Wear clean socks or stockings every day. Make sure they are not too tight. Do not wear knee-high stockings since they may decrease blood flow to your legs.  Wear shoes that fit properly and have enough cushioning. To break in new shoes, wear them for just a few hours a day. This prevents you from injuring your feet. Always look in your shoes before you put them on to be sure there are no objects inside.  Do not cross your legs. This may decrease the blood flow to your feet.  If you find a minor scrape,  cut, or break in the skin on your feet, keep it and the skin around it clean and dry. These areas may be cleansed with mild soap and water. Do not cleanse the area with peroxide, alcohol, or iodine.  When you remove an adhesive bandage, be sure not to damage the skin around it.  If you have a wound, look at it several times a day to make sure it is healing.  Do not use heating pads or hot water bottles. They may burn your skin. If you have lost feeling in your feet or legs, you may not know it is happening until it is too late.  Make sure your health care provider performs a complete foot exam at least annually or more often if you have foot problems. Report any cuts, sores, or bruises to your health care provider immediately. SEEK MEDICAL CARE IF:   You have an injury that is not healing.  You have cuts or breaks in the skin.  You have an ingrown nail.  You notice redness on your legs or feet.  You feel burning or tingling in your legs or feet.  You have pain or cramps in your legs and feet.  Your legs or feet are numb.  Your feet always feel cold. SEEK IMMEDIATE MEDICAL CARE IF:   There is increasing redness,   swelling, or pain in or around a wound.  There is a red line that goes up your leg.  Pus is coming from a wound.  You develop a fever or as directed by your health care provider.  You notice a bad smell coming from an ulcer or wound. Document Released: 01/22/2000 Document Revised: 09/26/2012 Document Reviewed: 07/03/2012 [employer] Patient Information 2014 [employer].  

## 2013-05-01 NOTE — Progress Notes (Signed)
   Subjective:    Patient ID: Holly Ellison, female    DOB: [birth year], 73 y.o.   MRN: [id number]  HPI I am here to get my toenails trimmed up and to get some places trimmed on     Review of Systems no new changes or significant findings noted     Objective:   Physical Exam Neurovascular status intact and unchanged pedal pulses DP and PT plus one over 4 bilateral thready PT nonpalpable on the left side there is a history of ulceration sub-first MTP her left with residual hemorrhage a keratosis and a 2 cm overall diameter no active discharge or drainage is noted there is also keratoses sub-fifth MTP area bilateral secondary plantigrade metatarsals patient wearing her diabetic shoes with insoles these are just with additional pocketing sub-first metatarsal on left foot this time. Refill time 3 seconds all digits nails thick criptotic incurvated and brittle in ingrowing 1 through 5 bilateral. The keratoses at this time is also debrided sub-1 left and sub-5 bilateral.       Assessment & Plan:  Assessment this time diabetes with peripheral neuropathy patient also some significant angiopathy history of ulceration currently ulcer stable does have keratoses with hemorrhage a keratoses sub-first left debrided maintain some padding and pocketing with diabetic insoles. Also debridement keratoses sub-5 bilateral and multiple dystrophic from criptotic nails debrided x10 at this time. Return for three-month to 3 months for continued followup and palliative care as needed  Harriet Masson DPM

## 2013-06-04 ENCOUNTER — Encounter: Payer: Self-pay | Admitting: Vascular Surgery

## 2013-06-05 ENCOUNTER — Ambulatory Visit: Payer: Medicare Other | Admitting: Vascular Surgery

## 2013-06-05 ENCOUNTER — Inpatient Hospital Stay (HOSPITAL_COMMUNITY): Admission: RE | Admit: 2013-06-05 | Payer: Medicare Other | Source: Ambulatory Visit

## 2013-07-16 ENCOUNTER — Encounter (HOSPITAL_COMMUNITY): Payer: Medicare Other | Admitting: Anesthesiology

## 2013-07-16 ENCOUNTER — Inpatient Hospital Stay (HOSPITAL_COMMUNITY): Payer: Medicare Other

## 2013-07-16 ENCOUNTER — Inpatient Hospital Stay (HOSPITAL_COMMUNITY): Payer: Medicare Other | Admitting: Anesthesiology

## 2013-07-16 ENCOUNTER — Inpatient Hospital Stay: Admit: 2013-07-16 | Payer: Self-pay | Admitting: Neurosurgery

## 2013-07-16 ENCOUNTER — Encounter (HOSPITAL_COMMUNITY)
Admission: AD | Disposition: A | Discharge status: Rehabilitation Facility | Payer: Self-pay | Source: Other Acute Inpatient Hospital | Attending: Neurosurgery

## 2013-07-16 ENCOUNTER — Inpatient Hospital Stay (HOSPITAL_COMMUNITY)
Admission: AD | Admit: 2013-07-16 | Discharge: 2013-07-19 | DRG: 028 | Disposition: A | Discharge status: Rehabilitation Facility | Payer: Medicare Other | Source: Other Acute Inpatient Hospital | Attending: Neurosurgery | Admitting: Neurosurgery

## 2013-07-16 DIAGNOSIS — K219 Gastro-esophageal reflux disease without esophagitis: Secondary | ICD-10-CM | POA: Diagnosis present

## 2013-07-16 DIAGNOSIS — D72829 Elevated white blood cell count, unspecified: Secondary | ICD-10-CM | POA: Diagnosis present

## 2013-07-16 DIAGNOSIS — Z7982 Long term (current) use of aspirin: Secondary | ICD-10-CM

## 2013-07-16 DIAGNOSIS — Z9861 Coronary angioplasty status: Secondary | ICD-10-CM

## 2013-07-16 DIAGNOSIS — I2510 Atherosclerotic heart disease of native coronary artery without angina pectoris: Secondary | ICD-10-CM | POA: Diagnosis present

## 2013-07-16 DIAGNOSIS — G8220 Paraplegia, unspecified: Secondary | ICD-10-CM | POA: Diagnosis present

## 2013-07-16 DIAGNOSIS — N186 End stage renal disease: Secondary | ICD-10-CM | POA: Diagnosis present

## 2013-07-16 DIAGNOSIS — Z87891 Personal history of nicotine dependence: Secondary | ICD-10-CM

## 2013-07-16 DIAGNOSIS — E1169 Type 2 diabetes mellitus with other specified complication: Secondary | ICD-10-CM | POA: Diagnosis present

## 2013-07-16 DIAGNOSIS — D334 Benign neoplasm of spinal cord: Principal | ICD-10-CM | POA: Diagnosis present

## 2013-07-16 DIAGNOSIS — E1142 Type 2 diabetes mellitus with diabetic polyneuropathy: Secondary | ICD-10-CM | POA: Diagnosis present

## 2013-07-16 DIAGNOSIS — Z7902 Long term (current) use of antithrombotics/antiplatelets: Secondary | ICD-10-CM

## 2013-07-16 DIAGNOSIS — J449 Chronic obstructive pulmonary disease, unspecified: Secondary | ICD-10-CM | POA: Diagnosis present

## 2013-07-16 DIAGNOSIS — M9080 Osteopathy in diseases classified elsewhere, unspecified site: Secondary | ICD-10-CM | POA: Diagnosis present

## 2013-07-16 DIAGNOSIS — Z79899 Other long term (current) drug therapy: Secondary | ICD-10-CM

## 2013-07-16 DIAGNOSIS — J4489 Other specified chronic obstructive pulmonary disease: Secondary | ICD-10-CM | POA: Diagnosis present

## 2013-07-16 DIAGNOSIS — G9340 Encephalopathy, unspecified: Secondary | ICD-10-CM | POA: Diagnosis present

## 2013-07-16 DIAGNOSIS — F329 Major depressive disorder, single episode, unspecified: Secondary | ICD-10-CM | POA: Diagnosis present

## 2013-07-16 DIAGNOSIS — M869 Osteomyelitis, unspecified: Secondary | ICD-10-CM | POA: Diagnosis present

## 2013-07-16 DIAGNOSIS — N2581 Secondary hyperparathyroidism of renal origin: Secondary | ICD-10-CM | POA: Diagnosis present

## 2013-07-16 DIAGNOSIS — D62 Acute posthemorrhagic anemia: Secondary | ICD-10-CM | POA: Diagnosis not present

## 2013-07-16 DIAGNOSIS — R159 Full incontinence of feces: Secondary | ICD-10-CM | POA: Diagnosis present

## 2013-07-16 DIAGNOSIS — Z992 Dependence on renal dialysis: Secondary | ICD-10-CM

## 2013-07-16 DIAGNOSIS — I252 Old myocardial infarction: Secondary | ICD-10-CM

## 2013-07-16 DIAGNOSIS — E1149 Type 2 diabetes mellitus with other diabetic neurological complication: Secondary | ICD-10-CM | POA: Diagnosis present

## 2013-07-16 DIAGNOSIS — I120 Hypertensive chronic kidney disease with stage 5 chronic kidney disease or end stage renal disease: Secondary | ICD-10-CM | POA: Diagnosis present

## 2013-07-16 DIAGNOSIS — E119 Type 2 diabetes mellitus without complications: Secondary | ICD-10-CM

## 2013-07-16 DIAGNOSIS — Z794 Long term (current) use of insulin: Secondary | ICD-10-CM

## 2013-07-16 DIAGNOSIS — I509 Heart failure, unspecified: Secondary | ICD-10-CM | POA: Diagnosis present

## 2013-07-16 DIAGNOSIS — F3289 Other specified depressive episodes: Secondary | ICD-10-CM | POA: Diagnosis present

## 2013-07-16 HISTORY — DX: Headache, unspecified: R51.9

## 2013-07-16 HISTORY — PX: LAMINECTOMY: SHX219

## 2013-07-16 HISTORY — DX: Headache: R51

## 2013-07-16 HISTORY — DX: Personal history of other diseases of the nervous system and sense organs: Z86.69

## 2013-07-16 HISTORY — DX: Orthostatic hypotension: I95.1

## 2013-07-16 LAB — POCT I-STAT 4, (NA,K, GLUC, HGB,HCT)
Glucose, Bld: 142 mg/dL — ABNORMAL HIGH (ref 70–99)
HEMATOCRIT: 32 % — AB (ref 36.0–46.0)
Hemoglobin: 10.9 g/dL — ABNORMAL LOW (ref 12.0–15.0)
Potassium: 3.5 mEq/L — ABNORMAL LOW (ref 3.7–5.3)
Sodium: 138 mEq/L (ref 137–147)

## 2013-07-16 SURGERY — THORACIC LAMINECTOMY FOR TUMOR
Anesthesia: General | Site: Back

## 2013-07-16 MED ORDER — ONDANSETRON HCL 4 MG/2ML IJ SOLN
INTRAMUSCULAR | Status: DC | PRN
  Administered 2013-07-16: 4 mg via INTRAVENOUS

## 2013-07-16 MED ORDER — NEOSTIGMINE METHYLSULFATE 10 MG/10ML IV SOLN
INTRAVENOUS | Status: DC | PRN
  Administered 2013-07-16: 3 mg via INTRAVENOUS

## 2013-07-16 MED ORDER — PHENYLEPHRINE HCL 10 MG/ML IJ SOLN
INTRAMUSCULAR | Status: DC | PRN
  Administered 2013-07-16: 40 ug via INTRAVENOUS
  Administered 2013-07-16: 80 ug via INTRAVENOUS

## 2013-07-16 MED ORDER — INSULIN ASPART 100 UNIT/ML ~~LOC~~ SOLN
3.0000 [IU] | Freq: Three times a day (TID) | SUBCUTANEOUS | Status: DC
  Filled 2013-07-16 (×24): qty 0.03

## 2013-07-16 MED ORDER — ARTIFICIAL TEARS OP OINT
TOPICAL_OINTMENT | OPHTHALMIC | Status: DC | PRN
Start: 2013-07-16 — End: 2013-07-16
  Administered 2013-07-16: 1 via OPHTHALMIC

## 2013-07-16 MED ORDER — PROPOFOL 10 MG/ML IV BOLUS
INTRAVENOUS | Status: DC | PRN
  Administered 2013-07-16: 100 mg via INTRAVENOUS
  Administered 2013-07-16: 50 mg via INTRAVENOUS

## 2013-07-16 MED ORDER — CEFAZOLIN SODIUM-DEXTROSE 2-3 GM-% IV SOLR
INTRAVENOUS | Status: DC | PRN
  Administered 2013-07-16: 2 g via INTRAVENOUS

## 2013-07-16 MED ORDER — BUPIVACAINE HCL (PF) 0.5 % IJ SOLN
INTRAMUSCULAR | Status: DC | PRN
  Administered 2013-07-16: 10 mL

## 2013-07-16 MED ORDER — SURGIFOAM 100 EX MISC
CUTANEOUS | Status: DC | PRN
  Administered 2013-07-16: 22:00:00 via TOPICAL

## 2013-07-16 MED ORDER — INSULIN ASPART 100 UNIT/ML ~~LOC~~ SOLN
0.0000 [IU] | Freq: Three times a day (TID) | SUBCUTANEOUS | Status: DC
  Filled 2013-07-16 (×24): qty 0.09

## 2013-07-16 MED ORDER — DEXAMETHASONE SODIUM PHOSPHATE 4 MG/ML IJ SOLN
INTRAMUSCULAR | Status: DC | PRN
  Administered 2013-07-16: 8 mg via INTRAVENOUS

## 2013-07-16 MED ORDER — FENTANYL CITRATE 0.05 MG/ML IJ SOLN
INTRAMUSCULAR | Status: AC
  Filled 2013-07-16: qty 2

## 2013-07-16 MED ORDER — 0.9 % SODIUM CHLORIDE (POUR BTL) OPTIME
TOPICAL | Status: DC | PRN
  Administered 2013-07-16: 1000 mL

## 2013-07-16 MED ORDER — SODIUM CHLORIDE 0.9 % IV SOLN
100.0000 [IU] | INTRAVENOUS | Status: DC | PRN
  Administered 2013-07-16: .8 [IU]/h via INTRAVENOUS

## 2013-07-16 MED ORDER — ROCURONIUM BROMIDE 100 MG/10ML IV SOLN
INTRAVENOUS | Status: DC | PRN
  Administered 2013-07-16: 50 mg via INTRAVENOUS

## 2013-07-16 MED ORDER — THROMBIN 5000 UNITS EX SOLR
OROMUCOSAL | Status: DC | PRN
  Administered 2013-07-16 (×3): via TOPICAL

## 2013-07-16 MED ORDER — PROMETHAZINE HCL 25 MG/ML IJ SOLN: 6.2500 mg | INTRAMUSCULAR | Status: DC | PRN

## 2013-07-16 MED ORDER — ALBUMIN HUMAN 5 % IV SOLN
INTRAVENOUS | Status: DC | PRN
  Administered 2013-07-16 (×2): via INTRAVENOUS

## 2013-07-16 MED ORDER — SODIUM CHLORIDE 0.9 % IV SOLN
INTRAVENOUS | Status: DC | PRN
  Administered 2013-07-16: 21:00:00 via INTRAVENOUS

## 2013-07-16 MED ORDER — LIDOCAINE HCL (CARDIAC) 20 MG/ML IV SOLN
INTRAVENOUS | Status: DC | PRN
  Administered 2013-07-16: 30 mg via INTRAVENOUS

## 2013-07-16 MED ORDER — GLYCOPYRROLATE 0.2 MG/ML IJ SOLN
INTRAMUSCULAR | Status: DC | PRN
  Administered 2013-07-16: .5 mg via INTRAVENOUS

## 2013-07-16 MED ORDER — INSULIN REGULAR HUMAN 100 UNIT/ML IJ SOLN
INTRAMUSCULAR | Status: DC
  Filled 2013-07-16: qty 1

## 2013-07-16 MED ORDER — LIDOCAINE-EPINEPHRINE 1 %-1:100000 IJ SOLN
INTRAMUSCULAR | Status: DC | PRN
  Administered 2013-07-16: 10 mL

## 2013-07-16 MED ORDER — FENTANYL CITRATE 0.05 MG/ML IJ SOLN
25.0000 ug | INTRAMUSCULAR | Status: DC | PRN
  Administered 2013-07-17 (×2): 25 ug via INTRAVENOUS

## 2013-07-16 MED ORDER — FENTANYL CITRATE 0.05 MG/ML IJ SOLN
INTRAMUSCULAR | Status: DC | PRN
  Administered 2013-07-16: 50 ug via INTRAVENOUS
  Administered 2013-07-16: 100 ug via INTRAVENOUS

## 2013-07-16 SURGICAL SUPPLY — 77 items
BAG DECANTER FOR FLEXI CONT (MISCELLANEOUS) IMPLANT
BENZOIN TINCTURE PRP APPL 2/3 (GAUZE/BANDAGES/DRESSINGS) IMPLANT
BLADE 10 SAFETY STRL DISP (BLADE) IMPLANT
BLADE SURG 11 STRL SS (BLADE) IMPLANT
BLADE SURG ROTATE 9660 (MISCELLANEOUS) IMPLANT
BLADE ULTRA TIP 2M (BLADE) IMPLANT
BUR MATCHSTICK NEURO 3.0 LAGG (BURR) ×3 IMPLANT
BUR ROUND FLUTED 5 RND (BURR) ×2 IMPLANT
BUR ROUND FLUTED 5MM RND (BURR) ×1
CANISTER SUCT 3000ML (MISCELLANEOUS) ×3 IMPLANT
CLOSURE WOUND 1/2 X4 (GAUZE/BANDAGES/DRESSINGS)
CONT SPEC 4OZ CLIKSEAL STRL BL (MISCELLANEOUS) ×9 IMPLANT
DERMABOND ADVANCED (GAUZE/BANDAGES/DRESSINGS) ×2
DERMABOND ADVANCED .7 DNX12 (GAUZE/BANDAGES/DRESSINGS) ×1 IMPLANT
DRAPE LAPAROTOMY 100X72 PEDS (DRAPES) IMPLANT
DRAPE LAPAROTOMY 100X72X124 (DRAPES) ×3 IMPLANT
DRAPE MICROSCOPE LEICA (MISCELLANEOUS) ×3 IMPLANT
DRAPE ORTHO SPLIT 77X108 STRL (DRAPES)
DRAPE POUCH INSTRU U-SHP 10X18 (DRAPES) ×3 IMPLANT
DRAPE SURG ORHT 6 SPLT 77X108 (DRAPES) IMPLANT
DRSG OPSITE POSTOP 4X8 (GAUZE/BANDAGES/DRESSINGS) ×3 IMPLANT
DRSG TELFA 3X8 NADH (GAUZE/BANDAGES/DRESSINGS) IMPLANT
DURAPREP 26ML APPLICATOR (WOUND CARE) ×3 IMPLANT
DURAPREP 6ML APPLICATOR 50/CS (WOUND CARE) IMPLANT
ELECT REM PT RETURN 9FT ADLT (ELECTROSURGICAL) ×3
ELECTRODE REM PT RTRN 9FT ADLT (ELECTROSURGICAL) ×1 IMPLANT
EVACUATOR 1/8 PVC DRAIN (DRAIN) ×3 IMPLANT
GAUZE SPONGE 4X4 16PLY XRAY LF (GAUZE/BANDAGES/DRESSINGS) ×3 IMPLANT
GLOVE BIO SURGEON STRL SZ8 (GLOVE) ×3 IMPLANT
GLOVE BIOGEL PI IND STRL 8 (GLOVE) IMPLANT
GLOVE BIOGEL PI IND STRL 8.5 (GLOVE) ×1 IMPLANT
GLOVE BIOGEL PI INDICATOR 8 (GLOVE)
GLOVE BIOGEL PI INDICATOR 8.5 (GLOVE) ×2
GLOVE ECLIPSE 7.5 STRL STRAW (GLOVE) IMPLANT
GLOVE EXAM NITRILE LRG STRL (GLOVE) IMPLANT
GLOVE EXAM NITRILE MD LF STRL (GLOVE) ×6 IMPLANT
GLOVE EXAM NITRILE XL STR (GLOVE) IMPLANT
GLOVE EXAM NITRILE XS STR PU (GLOVE) IMPLANT
GLOVE INDICATOR 7.0 STRL GRN (GLOVE) ×3 IMPLANT
GLOVE SURG SS PI 6.5 STRL IVOR (GLOVE) ×6 IMPLANT
GOWN STRL REUS W/ TWL LRG LVL3 (GOWN DISPOSABLE) ×2 IMPLANT
GOWN STRL REUS W/ TWL XL LVL3 (GOWN DISPOSABLE) IMPLANT
GOWN STRL REUS W/TWL 2XL LVL3 (GOWN DISPOSABLE) IMPLANT
GOWN STRL REUS W/TWL LRG LVL3 (GOWN DISPOSABLE) ×4
GOWN STRL REUS W/TWL XL LVL3 (GOWN DISPOSABLE)
HEMOSTAT SURGICEL 2X14 (HEMOSTASIS) IMPLANT
KIT BASIN OR (CUSTOM PROCEDURE TRAY) ×3 IMPLANT
KIT ROOM TURNOVER OR (KITS) ×3 IMPLANT
MARKER SKIN DUAL TIP RULER LAB (MISCELLANEOUS) IMPLANT
NEEDLE HYPO 25X1 1.5 SAFETY (NEEDLE) ×3 IMPLANT
NEEDLE SPNL 18GX3.5 QUINCKE PK (NEEDLE) ×12 IMPLANT
NEEDLE SPNL 22GX3.5 QUINCKE BK (NEEDLE) ×3 IMPLANT
NS IRRIG 1000ML POUR BTL (IV SOLUTION) ×3 IMPLANT
PACK LAMINECTOMY NEURO (CUSTOM PROCEDURE TRAY) ×3 IMPLANT
PATTIES SURGICAL .25X.25 (GAUZE/BANDAGES/DRESSINGS) IMPLANT
PATTIES SURGICAL .5 X.5 (GAUZE/BANDAGES/DRESSINGS) IMPLANT
PATTIES SURGICAL .5 X3 (DISPOSABLE) ×3 IMPLANT
RUBBERBAND STERILE (MISCELLANEOUS) ×6 IMPLANT
SPONGE GAUZE 4X4 12PLY (GAUZE/BANDAGES/DRESSINGS) IMPLANT
SPONGE LAP 4X18 X RAY DECT (DISPOSABLE) ×3 IMPLANT
SPONGE NEURO XRAY DETECT 1X3 (DISPOSABLE) ×3 IMPLANT
SPONGE SURGIFOAM ABS GEL 100 (HEMOSTASIS) ×3 IMPLANT
STAPLER SKIN PROX WIDE 3.9 (STAPLE) IMPLANT
STRIP CLOSURE SKIN 1/2X4 (GAUZE/BANDAGES/DRESSINGS) IMPLANT
SUT NURALON 4 0 TR CR/8 (SUTURE) IMPLANT
SUT SILK 6 0 BV 1XDISCX (SUTURE) IMPLANT
SUT VIC AB 0 CT1 18XCR BRD8 (SUTURE) ×1 IMPLANT
SUT VIC AB 0 CT1 8-18 (SUTURE) ×2
SUT VIC AB 2-0 CT1 18 (SUTURE) ×6 IMPLANT
SUT VIC AB 3-0 SH 8-18 (SUTURE) ×6 IMPLANT
SYR 20ML ECCENTRIC (SYRINGE) ×3 IMPLANT
TIP SONASTAR STD MISONIX 1.9 (TRAY / TRAY PROCEDURE) IMPLANT
TOWEL OR 17X24 6PK STRL BLUE (TOWEL DISPOSABLE) ×3 IMPLANT
TOWEL OR 17X26 10 PK STRL BLUE (TOWEL DISPOSABLE) ×3 IMPLANT
TRAY FOLEY CATH 14FRSI W/METER (CATHETERS) ×3 IMPLANT
UNDERPAD 30X30 INCONTINENT (UNDERPADS AND DIAPERS) ×3 IMPLANT
WATER STERILE IRR 1000ML POUR (IV SOLUTION) ×3 IMPLANT

## 2013-07-16 NOTE — Anesthesia Preprocedure Evaluation (Addendum)
Anesthesia Evaluation  Patient identified by MRN, date of birth, ID band Patient awake    Reviewed: Allergy & Precautions, H&P , NPO status , Patient's Chart, lab work & pertinent test results  History of Anesthesia Complications Negative for: history of anesthetic complications  Airway Mallampati: II TM Distance: >3 FB   Mouth opening: Limited Mouth Opening  Dental  (+) Poor Dentition, Dental Advisory Given   Pulmonary asthma , COPDformer smoker,    Pulmonary exam normal       Cardiovascular hypertension, + CAD, + Past MI, + Cardiac Stents, + CABG and + Peripheral Vascular Disease     Neuro/Psych    GI/Hepatic Neg liver ROS, GERD-  ,  Endo/Other  diabetesHyperthyroidism   Renal/GU ESRF and DialysisRenal disease     Musculoskeletal negative musculoskeletal ROS (+)   Abdominal   Peds  Hematology   Anesthesia Other Findings   Reproductive/Obstetrics                         Anesthesia Physical Anesthesia Plan  ASA: IV and emergent  Anesthesia Plan: General   Post-op Pain Management:    Induction: Intravenous  Airway Management Planned: Oral ETT  Additional Equipment:   Intra-op Plan:   Post-operative Plan: Possible Post-op intubation/ventilation  Informed Consent: I have reviewed the patients History and Physical, chart, labs and discussed the procedure including the risks, benefits and alternatives for the proposed anesthesia with the patient or authorized representative who has indicated his/her understanding and acceptance.   Dental advisory given  Plan Discussed with: CRNA, Anesthesiologist and Surgeon  Anesthesia Plan Comments:        Anesthesia Quick Evaluation

## 2013-07-16 NOTE — Op Note (Signed)
07/16/2013  11:22 PM  PATIENT:  Holly Ellison  73 y.o. 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  PROCEDURE:  Procedure(s): THORACIC LAMINECTOMY FOR TUMOR (N/A) with decompression of thoracic spinal cord  SURGEON:  Surgeon(s) and Role:    * Erline Levine, MD - Primary  PHYSICIAN ASSISTANT:   ASSISTANTS: none   ANESTHESIA:   general  EBL:  Total I/O In: 250 [IV Piggyback:250] Out: 354 [Urine:4; Blood:350]  BLOOD ADMINISTERED:none  DRAINS: (Medium) Hemovact drain(s) in the epidural space with  Suction Open   LOCAL MEDICATIONS USED:  LIDOCAINE   SPECIMEN:  No Specimen and Excision  DISPOSITION OF SPECIMEN:  PATHOLOGY  COUNTS:  YES  TOURNIQUET:  * No tourniquets in log *  DICTATION: DICTATION: Patient has spinal cord compression at T10 and 11 due to destructive lesion at T 11 with bilateral leg weakness and numbness and intractable pain. It was elected to take her to surgery for Thoracic laminectomy and decompression of spinal cord with biopsy of 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  Procedure: Patient was brought to the operating room and following the smooth and uncomplicated induction of general endotracheal anesthesia she was placed in a prone position on the Wilson frame. 4, 18-gauge spinal needles were taped to her back and an AP radiograph was obtained to localize the T9 12 levels. Her back was prepped and draped in the usual sterile fashion with betadine scrub and DuraPrep. Area of planned incision was infiltrated with local lidocaine. Incision was made in the midline and carried to the lumbodorsal fascia which was incised bilaterally. A total laminectomy of inferior T9  was performed with leksell, and the spinal cord dura was defined to the level of the top of L 1. I did not find frank neoplasm, but entered  the vertebra of T 11 on the left, which appeared heaped up, consistent with the imaging and multiple biopsies were obtained.  The spinal cord dura  was widely decompressed.  At this point it was felt that all neural elements were well decompressed. The wound was then irrigated with saline. Hemostasis was assured with bipolar electrocautery and gelfoam, followed by Surgifoam. A medium Hemovac drain was placed in the epidural space and anchored with a vicryl stitch.The lumbodorsal fascia was closed with 0 Vicryl sutures the subcutaneous tissues reapproximated 2-0 Vicryl inverted sutures and the skin edges were reapproximated with 3-0 Vicryl subcuticular stitch. The wound was dressed with Dermabond and an occlusive dressing. Patient was extubated in the operating room and taken to recovery in stable and satisfactory condition having tolerated her operation well counts were correct at the end of the case.  The Pathologist reviwed frozen material and felt that this appeared benign.   PLAN OF CARE: Admit to inpatient   PATIENT DISPOSITION:  PACU - hemodynamically stable.   Delay start of Pharmacological VTE agent (>24hrs) due to surgical blood loss or risk of bleeding: yes

## 2013-07-16 NOTE — Brief Op Note (Signed)
07/16/2013  11:22 PM  PATIENT:  Holly Ellison  73 y.o. 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  PROCEDURE:  Procedure(s): THORACIC LAMINECTOMY FOR TUMOR (N/A) with decompression of thoracic spinal cord  SURGEON:  Surgeon(s) and Role:    * Erline Levine, MD - Primary  PHYSICIAN ASSISTANT:   ASSISTANTS: none   ANESTHESIA:   general  EBL:  Total I/O In: 250 [IV Piggyback:250] Out: 354 [Urine:4; Blood:350]  BLOOD ADMINISTERED:none  DRAINS: (Medium) Hemovact drain(s) in the epidural space with  Suction Open   LOCAL MEDICATIONS USED:  LIDOCAINE   SPECIMEN:  No Specimen and Excision  DISPOSITION OF SPECIMEN:  PATHOLOGY  COUNTS:  YES  TOURNIQUET:  * No tourniquets in log *  DICTATION: DICTATION: Patient has spinal cord compression at T10 and 11 due to destructive lesion at T 11 with bilateral leg weakness and numbness and intractable pain. It was elected to take her to surgery for Thoracic laminectomy and decompression of spinal cord with biopsy of 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  Procedure: Patient was brought to the operating room and following the smooth and uncomplicated induction of general endotracheal anesthesia she was placed in a prone position on the Wilson frame. 4, 18-gauge spinal needles were taped to her back and an AP radiograph was obtained to localize the T9 12 levels. Her back was prepped and draped in the usual sterile fashion with betadine scrub and DuraPrep. Area of planned incision was infiltrated with local lidocaine. Incision was made in the midline and carried to the lumbodorsal fascia which was incised bilaterally. A total laminectomy of inferior T9  was performed with leksell, and the spinal cord dura was defined to the level of the top of L 1. I did not find frank neoplasm, but entered  the vertebra of T 11 on the left, which appeared heaped up, consistent with the imaging and multiple biopsies were obtained.  The spinal cord dura  was widely decompressed.  At this point it was felt that all neural elements were well decompressed. The wound was then irrigated with saline. Hemostasis was assured with bipolar electrocautery and gelfoam, followed by Surgifoam. A medium Hemovac drain was placed in the epidural space and anchored with a vicryl stitch.The lumbodorsal fascia was closed with 0 Vicryl sutures the subcutaneous tissues reapproximated 2-0 Vicryl inverted sutures and the skin edges were reapproximated with 3-0 Vicryl subcuticular stitch. The wound was dressed with Dermabond and an occlusive dressing. Patient was extubated in the operating room and taken to recovery in stable and satisfactory condition having tolerated her operation well counts were correct at the end of the case.  The Pathologist reviwed frozen material and felt that this appeared benign.   PLAN OF CARE: Admit to inpatient   PATIENT DISPOSITION:  PACU - hemodynamically stable.   Delay start of Pharmacological VTE agent (>24hrs) due to surgical blood loss or risk of bleeding: yes

## 2013-07-16 NOTE — Progress Notes (Signed)
Awake, alert, moving all extremities to command.  Stable.

## 2013-07-16 NOTE — Anesthesia Procedure Notes (Signed)
Procedure Name: Intubation Date/Time: 07/16/2013 9:20 PM Performed by: Hollie Salk Z Pre-anesthesia Checklist: Patient identified, Timeout performed, Emergency Drugs available, Suction available and Patient being monitored Patient Re-evaluated:Patient Re-evaluated prior to inductionOxygen Delivery Method: Circle system utilized Preoxygenation: Pre-oxygenation with 100% oxygen Intubation Type: IV induction and Cricoid Pressure applied Ventilation: Mask ventilation without difficulty Laryngoscope Size: Mac and 3 Grade View: Grade II Tube type: Subglottic suction tube Tube size: 7.5 mm Number of attempts: 1 Airway Equipment and Method: Stylet Placement Confirmation: ETT inserted through vocal cords under direct vision,  breath sounds checked- equal and bilateral and positive ETCO2 Secured at: 22 cm Tube secured with: Tape Dental Injury: Teeth and Oropharynx as per pre-operative assessment

## 2013-07-16 NOTE — Transfer of Care (Signed)
Immediate Anesthesia Transfer of Care Note  Patient: Holly Ellison  Procedure(s) Performed: Procedure(s): THORACIC LAMINECTOMY FOR TUMOR (N/A)  Patient Location: PACU  Anesthesia Type:General  Level of Consciousness: awake and patient cooperative  Airway & Oxygen Therapy: Patient Spontanous Breathing and Patient connected to face mask oxygen  Post-op Assessment: Report given to PACU RN and Post -op Vital signs reviewed and stable  Post vital signs: Reviewed and stable  Complications: No apparent anesthesia complications

## 2013-07-16 NOTE — H&P (Signed)
Reason for Consult:progressive paraparesis Referring Physician: Dr. Jeannette Corpus Ellison is an 73 y.o. female.  HPI: Patient is 73 year old female with ESRD on dialysis with 1 week h/o progressive weakness in legs and fecal incontinence and severe back pain.  ACT shows lytic lesion of T 11 and Thoracic MRI without gadolinium today demonstrates destructive lesion of T 11 with epidural cord compression at T 10/11. Patient transferred to [facility] for treatment.  Past Medical History  Diagnosis Date  . Chronic kidney disease     just found out? Placed graft in [facility]-Dr. Tarri Glenn in [facility].  started dialysis in 06/2011  . Arthritis     Osteoarthritis  . Neuropathy in diabetes   . Insomnia   . Asthma   . CAD (coronary artery disease) 2012    7 stents-First PCI was in [place].  The last time she had a procedure was  a stent last year, thena  CABG was undertaken in October [facility])  . GERD (gastroesophageal reflux disease)   . Diabetes mellitus     Type II  . COPD (chronic obstructive pulmonary disease)   . Hypertension   . Peripheral vascular disease     endarterectomy  . Anemia   . CHF (congestive heart failure)   . Coronary artery dilation   . Myocardial infarction 2013    x 3  . 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 Abdominal hysterectomy    . Coronary artery bypass graft  2012    7 stents  . Cea      Left  . Carpal tunnel release    . Av fistula placement    . Grave's disease      ? unclea rif ca or not     Family History  Problem Relation Age of Onset  . Diabetes type II      Social History:  reports that she quit smoking about 17 years ago. Her smoking use included Cigarettes. She smoked 0.00 packs per day. She has never used smokeless tobacco. She reports that she does not drink alcohol or use illicit drugs.  Allergies:  Allergies  Allergen Reactions  . Dilaudid [Hydromorphone Hcl] Itching   . Morphine Sulfate Itching  . Opium Itching  . Ciprocin-Fluocin-Procin [Fluocinolone Acetonide] Itching    Medications: I have reviewed the patient's current medications.  No results found for this or any previous visit (from the past 48 hour(s)).  No results found.  Review of Systems - Negative except as above    There were no vitals taken for this visit. Physical Exam  Constitutional: She is oriented to person, place, and time. She appears well-developed and well-nourished.  HENT:  Head: Normocephalic and atraumatic.  Eyes: Conjunctivae and EOM are normal. Pupils are equal, round, and reactive to light.  Neck: Normal range of motion. Neck supple.  Respiratory: Effort normal and breath sounds normal.  GI: Soft.  Musculoskeletal: Normal range of motion.  Neurological: She is alert and oriented to person, place, and time. GCS eye subscore is 4. GCS verbal subscore is 5. GCS motor subscore is 6. She displays no Babinski's sign on the right side. She displays no Babinski's sign on the left side.  Reflex Scores:      Patellar reflexes are 2+ on the right side and 2+ on the left side.      Achilles reflexes are 2+ on the  right side and 2+ on the left side. Weakness in both legs, better than antigravity, but without good strength in either leg.  Patient complains of numbness in both legs.    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  To OR on emergent basis for biopsy and decompression of spinal cord.  Erline Levine, MD 07/16/2013, 9:25 PM

## 2013-07-17 ENCOUNTER — Encounter (HOSPITAL_COMMUNITY): Payer: Self-pay | Admitting: *Deleted

## 2013-07-17 ENCOUNTER — Inpatient Hospital Stay (HOSPITAL_COMMUNITY): Payer: Medicare Other

## 2013-07-17 ENCOUNTER — Ambulatory Visit: Payer: Medicare Other

## 2013-07-17 DIAGNOSIS — G8220 Paraplegia, unspecified: Secondary | ICD-10-CM

## 2013-07-17 DIAGNOSIS — J449 Chronic obstructive pulmonary disease, unspecified: Secondary | ICD-10-CM

## 2013-07-17 DIAGNOSIS — N186 End stage renal disease: Secondary | ICD-10-CM

## 2013-07-17 LAB — HEMOGLOBIN AND HEMATOCRIT, BLOOD
HCT: 24.7 % — ABNORMAL LOW (ref 36.0–46.0)
HEMOGLOBIN: 8.2 g/dL — AB (ref 12.0–15.0)

## 2013-07-17 LAB — CBC WITH DIFFERENTIAL/PLATELET
BASOS PCT: 0 % (ref 0–1)
Basophils Absolute: 0 10*3/uL (ref 0.0–0.1)
EOS PCT: 0 % (ref 0–5)
Eosinophils Absolute: 0 10*3/uL (ref 0.0–0.7)
HCT: 14.3 % — ABNORMAL LOW (ref 36.0–46.0)
HEMOGLOBIN: 4.9 g/dL — AB (ref 12.0–15.0)
Lymphocytes Relative: 9 % — ABNORMAL LOW (ref 12–46)
Lymphs Abs: 1.5 10*3/uL (ref 0.7–4.0)
MCH: 33.1 pg (ref 26.0–34.0)
MCHC: 34.3 g/dL (ref 30.0–36.0)
MCV: 96.6 fL (ref 78.0–100.0)
MONO ABS: 0.3 10*3/uL (ref 0.1–1.0)
Monocytes Relative: 2 % — ABNORMAL LOW (ref 3–12)
Neutro Abs: 15.1 10*3/uL — ABNORMAL HIGH (ref 1.7–7.7)
Neutrophils Relative %: 89 % — ABNORMAL HIGH (ref 43–77)
Platelets: 411 10*3/uL — ABNORMAL HIGH (ref 150–400)
RBC: 1.48 MIL/uL — ABNORMAL LOW (ref 3.87–5.11)
RDW: 14.8 % (ref 11.5–15.5)
Smear Review: INCREASED
WBC: 16.9 10*3/uL — ABNORMAL HIGH (ref 4.0–10.5)

## 2013-07-17 LAB — COMPREHENSIVE METABOLIC PANEL
ALK PHOS: 96 U/L (ref 39–117)
ALT: 10 U/L (ref 0–35)
AST: 21 U/L (ref 0–37)
Albumin: 3.5 g/dL (ref 3.5–5.2)
BILIRUBIN TOTAL: 0.3 mg/dL (ref 0.3–1.2)
BUN: 25 mg/dL — ABNORMAL HIGH (ref 6–23)
CHLORIDE: 90 meq/L — AB (ref 96–112)
CO2: 23 meq/L (ref 19–32)
Calcium: 9.5 mg/dL (ref 8.4–10.5)
Creatinine, Ser: 3.85 mg/dL — ABNORMAL HIGH (ref 0.50–1.10)
GFR, EST AFRICAN AMERICAN: 13 mL/min — AB (ref >90)
GFR, EST NON AFRICAN AMERICAN: 11 mL/min — AB (ref >90)
GLUCOSE: 197 mg/dL — AB (ref 70–99)
Potassium: 3.9 mEq/L (ref 3.7–5.3)
SODIUM: 137 meq/L (ref 137–147)
Total Protein: 8 g/dL (ref 6.0–8.3)

## 2013-07-17 LAB — GLUCOSE, CAPILLARY
GLUCOSE-CAPILLARY: 138 mg/dL — AB (ref 70–99)
GLUCOSE-CAPILLARY: 178 mg/dL — AB (ref 70–99)
Glucose-Capillary: 178 mg/dL — ABNORMAL HIGH (ref 70–99)
Glucose-Capillary: 201 mg/dL — ABNORMAL HIGH (ref 70–99)
Glucose-Capillary: 228 mg/dL — ABNORMAL HIGH (ref 70–99)

## 2013-07-17 LAB — MRSA PCR SCREENING: MRSA by PCR: NEGATIVE

## 2013-07-17 LAB — BLOOD PRODUCT ORDER (VERBAL) VERIFICATION

## 2013-07-17 MED ORDER — RANOLAZINE ER 500 MG PO TB12
500.0000 mg | ORAL_TABLET | Freq: Two times a day (BID) | ORAL | Status: DC
  Administered 2013-07-17 – 2013-07-19 (×5): 500 mg via ORAL
  Filled 2013-07-17 (×6): qty 1

## 2013-07-17 MED ORDER — OXYCODONE HCL 5 MG PO TABS
10.0000 mg | ORAL_TABLET | Freq: Three times a day (TID) | ORAL | Status: DC | PRN
  Administered 2013-07-17 – 2013-07-18 (×3): 10 mg via ORAL
  Filled 2013-07-17 (×3): qty 2

## 2013-07-17 MED ORDER — CEFAZOLIN SODIUM 1-5 GM-% IV SOLN
1.0000 g | Freq: Three times a day (TID) | INTRAVENOUS | Status: AC
  Administered 2013-07-17 (×2): 1 g via INTRAVENOUS
  Filled 2013-07-17 (×2): qty 50

## 2013-07-17 MED ORDER — SODIUM CHLORIDE 0.9 % IJ SOLN
3.0000 mL | INTRAMUSCULAR | Status: DC | PRN
  Administered 2013-07-18: 3 mL via INTRAVENOUS

## 2013-07-17 MED ORDER — INSULIN ASPART 100 UNIT/ML IV SOLN: 2.0000 [IU] | Freq: Two times a day (BID) | INTRAVENOUS | Status: DC

## 2013-07-17 MED ORDER — LABETALOL HCL 5 MG/ML IV SOLN
10.0000 mg | INTRAVENOUS | Status: DC | PRN
  Administered 2013-07-17 – 2013-07-18 (×2): 10 mg via INTRAVENOUS
  Filled 2013-07-17 (×3): qty 4

## 2013-07-17 MED ORDER — CALCIUM CARBONATE ANTACID 500 MG PO CHEW
2.0000 | CHEWABLE_TABLET | Freq: Three times a day (TID) | ORAL | Status: DC
  Administered 2013-07-17 – 2013-07-19 (×5): 400 mg via ORAL
  Filled 2013-07-17 (×9): qty 2

## 2013-07-17 MED ORDER — PANTOPRAZOLE SODIUM 40 MG IV SOLR: 40.0000 mg | Freq: Every day | INTRAVENOUS | Status: DC

## 2013-07-17 MED ORDER — OXYCODONE-ACETAMINOPHEN 5-325 MG PO TABS: 1.0000 | ORAL_TABLET | ORAL | Status: DC | PRN

## 2013-07-17 MED ORDER — ACETAMINOPHEN 325 MG PO TABS: 650.0000 mg | ORAL_TABLET | ORAL | Status: DC | PRN

## 2013-07-17 MED ORDER — GABAPENTIN 100 MG PO CAPS
100.0000 mg | ORAL_CAPSULE | Freq: Three times a day (TID) | ORAL | Status: DC
  Administered 2013-07-17 – 2013-07-19 (×8): 100 mg via ORAL
  Filled 2013-07-17 (×9): qty 1

## 2013-07-17 MED ORDER — ALBUTEROL SULFATE (2.5 MG/3ML) 0.083% IN NEBU: 3.0000 mL | INHALATION_SOLUTION | Freq: Four times a day (QID) | RESPIRATORY_TRACT | Status: DC | PRN

## 2013-07-17 MED ORDER — SODIUM CHLORIDE 0.9 % IV SOLN
INTRAVENOUS | Status: DC
  Administered 2013-07-17: 03:00:00 via INTRAVENOUS

## 2013-07-17 MED ORDER — DIAZEPAM 5 MG PO TABS: 5.0000 mg | ORAL_TABLET | Freq: Four times a day (QID) | ORAL | Status: DC | PRN

## 2013-07-17 MED ORDER — INSULIN ASPART 100 UNIT/ML ~~LOC~~ SOLN
0.0000 [IU] | SUBCUTANEOUS | Status: DC
Start: 2013-07-17 — End: 2013-07-17
  Administered 2013-07-17: 3 [IU] via SUBCUTANEOUS
  Administered 2013-07-17 (×2): 2 [IU] via SUBCUTANEOUS
  Administered 2013-07-17: 3 [IU] via SUBCUTANEOUS

## 2013-07-17 MED ORDER — MENTHOL 3 MG MT LOZG: 1.0000 | LOZENGE | OROMUCOSAL | Status: DC | PRN

## 2013-07-17 MED ORDER — ZOLPIDEM TARTRATE 5 MG PO TABS
5.0000 mg | ORAL_TABLET | Freq: Every evening | ORAL | Status: DC | PRN
  Administered 2013-07-18: 5 mg via ORAL
  Filled 2013-07-17: qty 1

## 2013-07-17 MED ORDER — ASPIRIN EC 81 MG PO TBEC
81.0000 mg | DELAYED_RELEASE_TABLET | Freq: Every day | ORAL | Status: DC
  Administered 2013-07-17 – 2013-07-19 (×3): 81 mg via ORAL
  Filled 2013-07-17 (×3): qty 1

## 2013-07-17 MED ORDER — INSULIN GLARGINE 100 UNIT/ML ~~LOC~~ SOLN
10.0000 [IU] | Freq: Every day | SUBCUTANEOUS | Status: DC
  Administered 2013-07-17: 10 [IU] via SUBCUTANEOUS
  Filled 2013-07-17 (×2): qty 0.1

## 2013-07-17 MED ORDER — CLOPIDOGREL BISULFATE 75 MG PO TABS
75.0000 mg | ORAL_TABLET | Freq: Every day | ORAL | Status: DC
  Administered 2013-07-17 – 2013-07-19 (×3): 75 mg via ORAL
  Filled 2013-07-17 (×3): qty 1

## 2013-07-17 MED ORDER — ATORVASTATIN CALCIUM 40 MG PO TABS
40.0000 mg | ORAL_TABLET | Freq: Every day | ORAL | Status: DC
  Administered 2013-07-17 – 2013-07-19 (×3): 40 mg via ORAL
  Filled 2013-07-17 (×3): qty 1

## 2013-07-17 MED ORDER — SODIUM CHLORIDE 0.9 % IJ SOLN
3.0000 mL | Freq: Two times a day (BID) | INTRAMUSCULAR | Status: DC
  Administered 2013-07-17 – 2013-07-19 (×5): 3 mL via INTRAVENOUS

## 2013-07-17 MED ORDER — METHIMAZOLE 10 MG PO TABS
10.0000 mg | ORAL_TABLET | Freq: Every day | ORAL | Status: DC
  Administered 2013-07-17 – 2013-07-19 (×3): 10 mg via ORAL
  Filled 2013-07-17 (×3): qty 1

## 2013-07-17 MED ORDER — ALBUTEROL SULFATE (2.5 MG/3ML) 0.083% IN NEBU: 3.0000 mL | INHALATION_SOLUTION | RESPIRATORY_TRACT | Status: DC | PRN

## 2013-07-17 MED ORDER — PANTOPRAZOLE SODIUM 40 MG PO TBEC
40.0000 mg | DELAYED_RELEASE_TABLET | Freq: Every day | ORAL | Status: DC
  Administered 2013-07-17 – 2013-07-19 (×3): 40 mg via ORAL
  Filled 2013-07-17 (×3): qty 1

## 2013-07-17 MED ORDER — DARBEPOETIN ALFA-POLYSORBATE 25 MCG/0.42ML IJ SOLN
25.0000 ug | INTRAMUSCULAR | Status: DC
  Administered 2013-07-18: 25 ug via INTRAVENOUS
  Filled 2013-07-17: qty 0.42

## 2013-07-17 MED ORDER — SODIUM CHLORIDE 0.9 % IV SOLN: 250.0000 mL | INTRAVENOUS | Status: DC

## 2013-07-17 MED ORDER — PHENOL 1.4 % MT LIQD: 1.0000 | OROMUCOSAL | Status: DC | PRN

## 2013-07-17 MED ORDER — METOLAZONE 2.5 MG PO TABS
2.5000 mg | ORAL_TABLET | Freq: Every day | ORAL | Status: DC | PRN
  Filled 2013-07-17: qty 1

## 2013-07-17 MED ORDER — ONDANSETRON HCL 4 MG/2ML IJ SOLN
4.0000 mg | INTRAMUSCULAR | Status: DC | PRN
  Administered 2013-07-17: 4 mg via INTRAVENOUS
  Filled 2013-07-17: qty 2

## 2013-07-17 MED ORDER — ACETAMINOPHEN 650 MG RE SUPP: 650.0000 mg | RECTAL | Status: DC | PRN

## 2013-07-17 MED ORDER — DEXAMETHASONE 4 MG PO TABS
4.0000 mg | ORAL_TABLET | Freq: Four times a day (QID) | ORAL | Status: DC
  Administered 2013-07-17 – 2013-07-18 (×3): 4 mg via ORAL
  Filled 2013-07-17 (×7): qty 1

## 2013-07-17 MED ORDER — DOXERCALCIFEROL 4 MCG/2ML IV SOLN
4.0000 ug | INTRAVENOUS | Status: DC
  Administered 2013-07-18: 4 ug via INTRAVENOUS
  Filled 2013-07-17: qty 2

## 2013-07-17 MED ORDER — RENA-VITE PO TABS
1.0000 | ORAL_TABLET | Freq: Every day | ORAL | Status: DC
  Administered 2013-07-17 – 2013-07-18 (×2): 1 via ORAL
  Filled 2013-07-17 (×3): qty 1

## 2013-07-17 MED ORDER — DEXAMETHASONE SODIUM PHOSPHATE 4 MG/ML IJ SOLN
4.0000 mg | Freq: Four times a day (QID) | INTRAMUSCULAR | Status: DC
  Administered 2013-07-17 (×2): 4 mg via INTRAVENOUS
  Filled 2013-07-17 (×8): qty 1

## 2013-07-17 MED ORDER — SILVER SULFADIAZINE 1 % EX CREA: 1.0000 "application " | TOPICAL_CREAM | Freq: Every day | CUTANEOUS | Status: DC

## 2013-07-17 MED ORDER — CEFAZOLIN SODIUM-DEXTROSE 2-3 GM-% IV SOLR
2.0000 g | INTRAVENOUS | Status: DC
  Administered 2013-07-18: 2 g via INTRAVENOUS
  Filled 2013-07-17: qty 50

## 2013-07-17 MED ORDER — METOPROLOL TARTRATE 50 MG PO TABS
50.0000 mg | ORAL_TABLET | Freq: Two times a day (BID) | ORAL | Status: DC
  Administered 2013-07-17 – 2013-07-19 (×4): 50 mg via ORAL
  Filled 2013-07-17 (×6): qty 1

## 2013-07-17 MED ORDER — INSULIN ASPART 100 UNIT/ML ~~LOC~~ SOLN
0.0000 [IU] | Freq: Three times a day (TID) | SUBCUTANEOUS | Status: DC
  Administered 2013-07-18 – 2013-07-19 (×3): 3 [IU] via SUBCUTANEOUS
  Administered 2013-07-19: 2 [IU] via SUBCUTANEOUS

## 2013-07-17 MED ORDER — HYDROCODONE-ACETAMINOPHEN 5-325 MG PO TABS
1.0000 | ORAL_TABLET | ORAL | Status: DC | PRN
  Administered 2013-07-17: 1 via ORAL
  Administered 2013-07-17 – 2013-07-19 (×4): 2 via ORAL
  Filled 2013-07-17 (×5): qty 2

## 2013-07-17 MED ORDER — DOCUSATE SODIUM 100 MG PO CAPS
100.0000 mg | ORAL_CAPSULE | Freq: Two times a day (BID) | ORAL | Status: DC
  Administered 2013-07-17 – 2013-07-19 (×4): 100 mg via ORAL
  Filled 2013-07-17 (×6): qty 1

## 2013-07-17 NOTE — Progress Notes (Signed)
OT Cancellation Note  Patient Details Name: Holly Ellison MRN: [id number] DOB: 08/26/1940   Cancelled Treatment:    Reason Eval/Treat Not Completed: Patient at procedure or test/ unavailable (Pt at CT. will attempt again later)  [place], OTR/L  [phone] 07/17/2013 07/17/2013, 1:23 PM

## 2013-07-17 NOTE — Progress Notes (Signed)
ANTIBIOTIC CONSULT NOTE  Pharmacy Consult for cefazolin Indication: possible osteomyelitis  Allergies  Allergen Reactions  . Dilaudid [Hydromorphone Hcl] Itching  . Morphine Sulfate Itching  . Opium Itching  . Ciprofloxacin Hcl Itching  . Procainamide Hcl     Unknown reaction    Patient Measurements: Height: 5\' 6"  (167.6 cm) Weight: 157 lb 10.1 oz (71.5 kg) IBW/kg (Calculated) : 59.3   Vital Signs: Temp: 98.9 F (37.2 C) (06/10 0700) Temp src: Oral (06/10 0700) BP: 145/69 mmHg (06/10 0700) Pulse Rate: 77 (06/10 0700) Intake/Output from previous day: 06/09 0701 - 06/10 0700 In: 1241.2 [I.V.:691.2; IV Piggyback:550] Out: 434 [Urine:34; Drains:50; Blood:350] Intake/Output from this shift:    Labs:  Recent Labs  07/16/13 2227  HGB 10.9*   Medical History: Past Medical History  Diagnosis Date  . Chronic kidney disease     just found out? Placed graft in [facility] Tar[facility] Glenn in [facility].  started dialysis in 06/2011  . Arthritis     Osteoarthritis  . Neuropathy in diabetes   . Insomnia   . Asthma   . CAD (coronary artery disease) 2012    7 stents-First PCI was in [place].  The last time she had a procedure was  a stent last year, thena  CABG was undertaken in October [facility])  . GERD (gastroesophageal reflux disease)   . Diabetes mellitus     Type II  . COPD (chronic obstructive pulmonary disease)   . Hypertension   . Peripheral vascular disease     endarterectomy  . Anemia   . CHF (congestive heart failure)   . Coronary artery dilation   . Myocardial infarction 2013    x 3  . 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 H/O Bell's palsy   . Frequent headaches   . Orthostasis     Medications:  See EMR  Assessment: 73 year old female transferred from [facility] with complaints of progressive weakness bilaterally in legs, fecal incontinence and severe back pain.  Transferred to [facility] and underwent laminectomy/decompression in  T10/11 for presumed neoplasm on 6/9.  Cefazolin was started for possible osteomyelitis.  Pt has reported history of MSSA bacteremia in 2014.  Pt is normotensive and afebrile.    Goal of Therapy:  Resolution of infection   Plan:  -Cefazolin 2 g IV qTuThS with HD -F/u HD schedule -Monitor cultures    Hughes Better, PharmD, BCPS Clinical Pharmacist Pager: [phone] 07/17/2013 9:47 AM

## 2013-07-17 NOTE — Evaluation (Signed)
Physical Therapy Evaluation Patient Details Name: Holly Ellison MRN: [id number] DOB: Mar 02, 1940 Today's Date: 07/17/2013   History of Present Illness  Patient is 73 year old female with ESRD on dialysis with 1 week h/o progressive weakness in legs and fecal incontinence and severe back pain.  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  Clinical Impression  Patient demonstrates deficits in functional mobility as indicated below. Will need continued skilled PT to address deficits and maximize function. Will see as indicated and progress as tolerate. Recommend CIR upon acute discharge.    Follow Up Recommendations CIR    Equipment Recommendations       Recommendations for Other Services Rehab consult     Precautions / Restrictions Precautions Precautions: Back;Fall Precaution Comments: educated on back precautions for comfort Restrictions Weight Bearing Restrictions: No      Mobility  Bed Mobility Overal bed mobility: Needs Assistance Bed Mobility: Rolling;Sidelying to Sit Rolling: Min assist;+2 for physical assistance Sidelying to sit: Max assist;+2 for physical assistance       General bed mobility comments: VCs for technique and positioning  Transfers Overall transfer level: Needs assistance Equipment used: Rolling walker (2 wheeled) Transfers: Sit to/from Stand Sit to Stand: Mod assist;+2 physical assistance         General transfer comment: VCs for hand placement and safety, assist for elevation and stability  Ambulation/Gait Ambulation/Gait assistance: Min assist Ambulation Distance (Feet): 24 Feet Assistive device: Rolling walker (2 wheeled) Gait Pattern/deviations: Step-through pattern;Decreased stride length;Shuffle;Drifts right/left;Trunk flexed;Narrow base of  support Gait velocity: decreased Gait velocity interpretation: <1.8 ft/sec, indicative of risk for recurrent falls General Gait Details: patient requires max cues for posture, encouragement for ambulation, and assist for stability and control of RW.  Stairs            Wheelchair Mobility    Modified Rankin (Stroke Patients Only)       Balance Overall balance assessment: Needs assistance         Standing balance support: Bilateral upper extremity supported;During functional activity Standing balance-Leahy Scale: Fair                               Pertinent Vitals/Pain Patient reports "alot of pain" at this time, no numerical value given, pain in low back region    [place] expects to be discharged to:: Private residence Living Arrangements: Children Available Help at Discharge: Family Type of Home: House Home Access: Level entry     [place]: One [place]: Environmental consultant - 2 wheels;Shower seat;Wheelchair - manual      Prior Function Level of Independence: Independent with assistive device(s)               Hand Dominance   Dominant Hand: Right    Extremity/Trunk Assessment   Upper Extremity Assessment: Generalized weakness           Lower Extremity Assessment: RLE deficits/detail;LLE deficits/detail RLE Deficits / Details: less than 3/5 gross strength all motions LLE Deficits / Details: less than 3/5 gross strength all motions     Communication   Communication: HOH  Cognition Arousal/Alertness: Awake/alert Behavior During Therapy: Anxious Overall Cognitive Status: Impaired/Different from baseline Area of Impairment: Following commands;Safety/judgement;Awareness;Problem solving  Following Commands: Follows multi-step commands with increased time Safety/Judgement: Decreased awareness of safety;Decreased awareness of deficits Awareness: Emergent Problem Solving: Slow processing;Decreased  initiation;Difficulty sequencing;Requires verbal cues;Requires tactile cues      General Comments General comments (skin integrity, edema, etc.): spoke with patient at legnth regarding mobility expectations, safety, use of DME, and assist. Spoke with patient regarding rehab recommendation post acute.     Exercises        Assessment/Plan    PT Assessment Patient needs continued PT services  PT Diagnosis Difficulty walking;Abnormality of gait;Generalized weakness;Acute pain   PT Problem List Decreased strength;Decreased range of motion;Decreased activity tolerance;Decreased balance;Decreased mobility;Decreased coordination;Decreased cognition;Decreased safety awareness;Pain  PT Treatment Interventions DME instruction;Gait training;Stair training;Functional mobility training;Therapeutic activities;Therapeutic exercise;Balance training;Patient/family education   PT Goals (Current goals can be found in the Care Plan section) Acute Rehab PT Goals Patient Stated Goal: to not have this pain PT Goal Formulation: With patient Time For Goal Achievement: 07/31/13 Potential to Achieve Goals: Fair    Frequency Min 5X/week   Barriers to discharge        Co-evaluation               End of Session Equipment Utilized During Treatment: Gait belt Activity Tolerance: Patient tolerated treatment well;Patient limited by fatigue;Patient limited by pain Patient left: in chair;with call bell/phone within reach Nurse Communication: Mobility status         Time: 2248-2500 PT Time Calculation (min): 26 min   Charges:   PT Evaluation $Initial PT Evaluation Tier I: 1 Procedure PT Treatments $Therapeutic Activity: 8-22 mins $Self Care/Home Management: 8-22   PT G CodesDuncan Dull 07/17/2013, 11:13 AM  Alben Deeds, PT DPT  [phone]

## 2013-07-17 NOTE — Anesthesia Postprocedure Evaluation (Signed)
Anesthesia Post Note  Patient: Holly Ellison  Procedure(s) Performed: Procedure(s) (LRB): THORACIC LAMINECTOMY FOR TUMOR (N/A)  Anesthesia type: general  Patient location: PACU  Post pain: Pain level controlled  Post assessment: Patient's Cardiovascular Status Stable  Last Vitals:  Filed Vitals:   07/17/13 0015  BP: 155/36  Pulse: 82  Temp: 36.9 C  Resp: 14    Post vital signs: Reviewed and stable  Level of consciousness: sedated  Complications: No apparent anesthesia complications

## 2013-07-17 NOTE — Progress Notes (Signed)
Patient complaining of heartburn. Water encouraged. [employer] notified. Thayer Ohm D

## 2013-07-17 NOTE — Progress Notes (Signed)
UR completed.  Mardell Cragg, RN BSN MHA CCM Trauma/Neuro ICU Case Manager [phone]  

## 2013-07-17 NOTE — Consult Note (Addendum)
PULMONARY / CRITICAL CARE MEDICINE   Name: Holly Ellison MRN: [id number] DOB: 14-Aug-1940    ADMISSION DATE:  07/16/2013 CONSULTATION DATE:  07/17/13  REFERRING MD :  EDP PRIMARY SERVICE: PCCM  CHIEF COMPLAINT:  Medical management post T11 laminectomy  BRIEF PATIENT DESCRIPTION: 73 y.o. 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  PCCM was consulted for assistance with medical management. PMH - ESRD on HD, CAD, COPD, DM-2  SIGNIFICANT EVENTS / STUDIES:  6/9 - to OR for thoracic laminectomy for tumor with decompression of thoracic spinal cord.  LINES / TUBES: Foley 6/9 >>> Thoracic hemovac 6/9 >>>  CULTURES: None  ANTIBIOTICS:   HISTORY OF PRESENT ILLNESS:  Holly Ellison is a 73 y.o. F with PMH as outlined below who presented to [facility] with 1 week hx of progressive weakness in her legs, back pain, and fecal incontinence.  CT was obtained which revealed a lytic lesion of T11 and thoracic MRI demonstrated destructive lesion of T11 with epidural cord compression at T10 - T11.  Pt was taken to the OR that evening for laminectomy and decompression.  PCCM was asked to assist with medical management post op.   PAST MEDICAL HISTORY :  Past Medical History  Diagnosis Date  . Chronic kidney disease     just found out? Placed graft in NOv-Dr. Tarri Glenn in [facility].  started dialysis in 06/2011  . Arthritis     Osteoarthritis  . Neuropathy in diabetes   . Insomnia   . Asthma   . CAD (coronary artery disease) 2012    7 stents-First PCI was in [place].  The last time she had a procedure was  a stent last year, thena  CABG was undertaken in October [facility])  . GERD (gastroesophageal reflux disease)   . Diabetes mellitus     Type II  . COPD (chronic obstructive pulmonary disease)   . Hypertension   . Peripheral vascular disease     endarterectomy  . Anemia   . CHF (congestive heart failure)   . Coronary artery dilation    . Myocardial infarction 2013    x 3  . 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 Abdominal hysterectomy    . Coronary artery bypass graft  2012    7 stents  . Cea      Left  . Carpal tunnel release    . Av fistula placement    . Grave's disease      ? unclea rif ca or not    Prior to Admission medications   Medication Sig Start Date End Date Taking? Authorizing Provider  albuterol (PROVENTIL HFA;VENTOLIN HFA) 108 (90 BASE) MCG/ACT inhaler Inhale 2 puffs into the lungs every 6 (six) hours as needed for wheezing.    Historical Provider, MD  amitriptyline (ELAVIL) 25 MG tablet  01/16/13   Historical Provider, MD  ampicillin (PRINCIPEN) 250 MG capsule  03/22/13   Historical Provider, MD  aspirin EC 81 MG EC tablet Take 1 tablet (81 mg total) by mouth daily. 07/31/12   Janece Canterbury, MD  atorvastatin (LIPITOR) 40 MG tablet Take 40 mg by mouth daily.    Historical Provider, MD  clopidogrel (PLAVIX) 75 MG tablet Take 1 tablet (75 mg total) by mouth daily. 11/02/11   Monika Salk, MD  gabapentin (NEURONTIN) 100 MG capsule Take 100 mg by  mouth 3 (three) times daily.    Historical Provider, MD  insulin aspart (NOVOLOG) 100 unit/mL injection Inject 2 Units into the skin 2 (two) times daily with a meal. Sliding scale: No units if <120, 3 units if 120-150, 4 if 150-180    Historical Provider, MD  LANTUS SOLOSTAR 100 UNIT/ML Solostar Pen  03/20/13   Historical Provider, MD  lidocaine (XYLOCAINE) 5 % ointment  01/21/13   Historical Provider, MD  methimazole (TAPAZOLE) 10 MG tablet Take 10 mg by mouth daily.    Historical Provider, MD  metolazone (ZAROXOLYN) 2.5 MG tablet Take 2.5 mg by mouth daily as needed.     Historical Provider, MD  metoprolol (LOPRESSOR) 100 MG tablet Take 50 mg by mouth 2 (two) times daily.    Historical Provider, MD  metoprolol succinate (TOPROL-XL) 100 MG 24 hr tablet  02/06/13   Historical Provider, MD  Oxycodone  HCl 10 MG TABS Take 10 mg by mouth 3 (three) times daily as needed (Pain).    Historical Provider, MD  pantoprazole (PROTONIX) 40 MG tablet Take 40 mg by mouth daily.    Historical Provider, MD  ranolazine (RANEXA) 500 MG 12 hr tablet Take 500 mg by mouth 2 (two) times daily.    Historical Provider, MD  sertraline (ZOLOFT) 25 MG tablet  03/20/13   Historical Provider, MD  silver sulfADIAZINE (SILVADENE) 1 % cream Apply 1 application topically daily. 02/13/13   Harriet Masson, DPM  topiramate (TOPAMAX) 25 MG tablet  02/12/13   Historical Provider, MD  zolpidem (AMBIEN) 5 MG tablet  12/19/12   Historical Provider, MD   Allergies  Allergen Reactions  . Dilaudid [Hydromorphone Hcl] Itching  . Morphine Sulfate Itching  . Opium Itching  . Ciprocin-Fluocin-Procin [Fluocinolone Acetonide] Itching    FAMILY HISTORY:  Family History  Problem Relation Age of Onset  . Diabetes type II     SOCIAL HISTORY:  reports that she quit smoking about 17 years ago. Her smoking use included Cigarettes. She smoked 0.00 packs per day. She has never used smokeless tobacco. She reports that she does not drink alcohol or use illicit drugs.  REVIEW OF SYSTEMS:  All negative; except for those that are bolded, which indicate positives.  Constitutional: weight loss, weight gain, night sweats, fevers, chills, fatigue, weakness.  HEENT: headaches, sore throat, sneezing, nasal congestion, post nasal drip, difficulty swallowing, tooth/dental problems, visual complaints, visual changes, ear aches. Neuro: difficulty with speech, weakness, numbness, ataxia. CV:  chest pain, orthopnea, PND, swelling in lower extremities, dizziness, palpitations, syncope.  Resp: cough, hemoptysis, dyspnea, wheezing. GI  heartburn, indigestion, abdominal pain, nausea, vomiting, diarrhea, constipation, change in bowel habits, loss of appetite, hematemesis, melena, hematochezia.  GU: dysuria, change in color of urine, urgency or frequency, flank  pain, hematuria. MSK: joint pain or swelling, decreased range of motion. Psych: change in mood or affect, depression, anxiety, suicidal ideations, homicidal ideations. Skin: rash, itching, bruising.    SUBJECTIVE:   VITAL SIGNS: Temp:  [97 F (36.1 C)-98.4 F (36.9 C)] 97.8 F (36.6 C) (06/10 0030) Pulse Rate:  [78-90] 82 (06/10 0300) Resp:  [13-30] 18 (06/10 0300) BP: (97-171)/(36-78) 150/68 mmHg (06/10 0300) SpO2:  [93 %-100 %] 100 % (06/10 0300) HEMODYNAMICS:   VENTILATOR SETTINGS:   INTAKE / OUTPUT: Intake/Output     06/09 0701 - 06/10 0700   I.V. 651.2   IV Piggyback 500   Total Intake 1151.2   Urine 4   Blood 350   Total Output 354  Net +797.2         PHYSICAL EXAMINATION: General: Pleasant female, resting in bed, in NAD. Neuro: A&O x 3, BLes 4/5, UE 5/5 HEENT: [place]/AT. PERRL, sclerae anicteric. Cardiovascular: RRR, no M/R/G.  Lungs: Respirations even and unlabored.  CTA bilaterally, No W/R/R.  Abdomen: BS x 4, soft, NT/ND.  Musculoskeletal: No gross deformities, no edema.  Skin: Intact, warm, no rashes.    LABS:  CBC  Recent Labs Lab 07/16/13 2227  HGB 10.9*  HCT 32.0*   Coag's No results found for this basename: APTT, INR,  in the last 168 hours BMET  Recent Labs Lab 07/16/13 2227  NA 138  K 3.5*  GLUCOSE 142*   Electrolytes No results found for this basename: CALCIUM, MG, PHOS,  in the last 168 hours Sepsis Markers No results found for this basename: LATICACIDVEN, PROCALCITON, O2SATVEN,  in the last 168 hours ABG No results found for this basename: PHART, PCO2ART, PO2ART,  in the last 168 hours Liver Enzymes No results found for this basename: AST, ALT, ALKPHOS, BILITOT, ALBUMIN,  in the last 168 hours Cardiac Enzymes No results found for this basename: TROPONINI, PROBNP,  in the last 168 hours Glucose  Recent Labs Lab 07/16/13 2339 07/17/13 0424  GLUCAP 138* 178*    Imaging Dg Thoracolumbar Spine  07/17/2013   CLINICAL  DATA:  Thoracic laminectomy for tumor.  EXAM: THORACOLUMBAR SPINE - 2 VIEW  COMPARISON:  MRI of the thoracic spine July 16, 2013.  FINDINGS: Two frontal prone radiographs of the thoracolumbar junction, appearing interoperative, radiologist was not present.  A grid has been placed over the thoracolumbar spine, needle markers overlie the L3, L1 vertebral bodies and inferior endplate of T9 and O13. Status post median sternotomy.  IMPRESSION: Limited intraoperative AP views of the thoracolumbar spine demonstrate markers indicating T9, and T11 inferior endplate in the L1, and L3 vertebral bodies.   Electronically Signed   By: Elon Alas   On: 07/17/2013 02:46     ASSESSMENT / PLAN:  PULMONARY A: Hx COPD, heavy ex smoker At risk atelectasis P:   - Albuterol PRN. - Pulmonary toiletry. -Consider CT chest for lung cancer screening  CARDIOVASCULAR A:  Hx HTN Hx CAD Hx HLD P:  - Resume home meds.  RENAL A:   ESRD - normally dialyses T/Th/Sat. P:   - Renal informed - HD on thu - NS KVO. - BMP in AM.  GASTROINTESTINAL A:   Nutrition P:   - Renal diet. - Pantoprazole.  HEMATOLOGIC A:  No acute issues. P:  - VTE Proph:  SCD's. - CBC in AM.  INFECTIOUS A:   No acute issues. H/o MSSA bacteremia in 07/2012 ,neg TTE  S/p 4 wks of abx therapy P:   - chk blood cx, ct ancef  ENDOCRINE A:   DM  Hyperthyroidism P:   - CBG's q4hr. - SSI, resume lantus 1/2 dose . - Resume methimazole.  NEUROLOGIC A:   S/p T11 laminectomy / decompression of spinal cord. Acute encephalopathy after anesthesia P:   - Per neurosurgery -prelim neg for malignancy, could this be infectious? -PT consult   Montey Hora, PA - [facility] [place] [facility] Pgr: [phone]  or [phone]   I have personally obtained a history, examined the patient, evaluated laboratory and imaging results, formulated the assessment and plan and placed orders.   Kara Mead MD.  Shade Flood. [place] Pager [phone]  If no response call [phone]      07/17/2013, 5:13 AM

## 2013-07-17 NOTE — Consult Note (Signed)
Holly Ellison is an 73 y.o. female referred by Dr Vertell Limber   Chief Complaint: ESRD, anemia, Sec HPTH HPI: 74yo BF with ESRD admitted yest for destructive lesion causing LE weakness and incontinence.  Taken to surgery last night for laminectomy. Per neurosurgery note, suspect this may be non malignant lesion, ? Old osteo.  She says she has never had a permcath.  Past Medical History  Diagnosis Date  . Chronic kidney disease     just found out? Placed graft in NOv-Dr. Tarri Glenn in [facility].  started dialysis in 06/2011  . Arthritis     Osteoarthritis  . Neuropathy in diabetes   . Insomnia   . Asthma   . CAD (coronary artery disease) 2012    7 stents-First PCI was in [place].  The last time she had a procedure was  a stent last year, thena  CABG was undertaken in October [facility])  . GERD (gastroesophageal reflux disease)   . Diabetes mellitus     Type II  . COPD (chronic obstructive pulmonary disease)   . Hypertension   . Peripheral vascular disease     endarterectomy  . Anemia   . CHF (congestive heart failure)   . Coronary artery dilation   . Myocardial infarction 2013    x 3  . 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 H/O Bell's palsy   . Frequent headaches   . Orthostasis     Past Surgical History  Procedure Laterality Date  . Abdominal hysterectomy    . Coronary artery bypass graft  2012    7 stents  . Cea      Left  . Carpal tunnel release    . Av fistula placement    . Grave's disease      ? unclea rif ca or not     Family History  Problem Relation Age of Onset  . Diabetes type II    Mother had ESRD ? Etiology  Social History:  reports that she quit smoking about 17 years ago. Her smoking use included Cigarettes. She smoked 0.00 packs per day. She has never used smokeless tobacco. She reports that she does not drink alcohol or use illicit drugs. Lives in [place] with daughter but son is main caretaker Allergies:  Allergies  Allergen  Reactions  . Dilaudid [Hydromorphone Hcl] Itching  . Morphine Sulfate Itching  . Opium Itching  . Ciprocin-Fluocin-Procin [Fluocinolone Acetonide] Itching    Medications Prior to Admission  Medication Sig Dispense Refill  . albuterol (PROVENTIL HFA;VENTOLIN HFA) 108 (90 BASE) MCG/ACT inhaler Inhale 2 puffs into the lungs every 6 (six) hours as needed for wheezing.      Marland Kitchen amitriptyline (ELAVIL) 25 MG tablet       . ampicillin (PRINCIPEN) 250 MG capsule       . aspirin EC 81 MG EC tablet Take 1 tablet (81 mg total) by mouth daily.      Marland Kitchen atorvastatin (LIPITOR) 40 MG tablet Take 40 mg by mouth daily.      . clopidogrel (PLAVIX) 75 MG tablet Take 1 tablet (75 mg total) by mouth daily.  30 tablet  0  . gabapentin (NEURONTIN) 100 MG capsule Take 100 mg by mouth 3 (three) times daily.      . insulin aspart (NOVOLOG) 100 unit/mL injection Inject 2 Units into the skin 2 (two) times daily with a meal. Sliding scale: No units if <120, 3 units if 120-150, 4  if 150-180      . LANTUS SOLOSTAR 100 UNIT/ML Solostar Pen       . lidocaine (XYLOCAINE) 5 % ointment       . methimazole (TAPAZOLE) 10 MG tablet Take 10 mg by mouth daily.      . metolazone (ZAROXOLYN) 2.5 MG tablet Take 2.5 mg by mouth daily as needed.       . metoprolol (LOPRESSOR) 100 MG tablet Take 50 mg by mouth 2 (two) times daily.      . metoprolol succinate (TOPROL-XL) 100 MG 24 hr tablet       . Oxycodone HCl 10 MG TABS Take 10 mg by mouth 3 (three) times daily as needed (Pain).      . pantoprazole (PROTONIX) 40 MG tablet Take 40 mg by mouth daily.      . ranolazine (RANEXA) 500 MG 12 hr tablet Take 500 mg by mouth 2 (two) times daily.      . sertraline (ZOLOFT) 25 MG tablet       . silver sulfADIAZINE (SILVADENE) 1 % cream Apply 1 application topically daily.  50 g  2  . topiramate (TOPAMAX) 25 MG tablet       . zolpidem (AMBIEN) 5 MG tablet          Lab Results: UA: ND   Recent Labs  07/16/13 2227  HGB 10.9*  HCT 32.0*    BMET  Recent Labs  07/16/13 2227  NA 138  K 3.5*  GLUCOSE 142*   LFT No results found for this basename: PROT, ALBUMIN, AST, ALT, ALKPHOS, BILITOT, BILIDIR, IBILI,  in the last 72 hours Dg Thoracolumbar Spine  07/17/2013   CLINICAL DATA:  Thoracic laminectomy for tumor.  EXAM: THORACOLUMBAR SPINE - 2 VIEW  COMPARISON:  MRI of the thoracic spine July 16, 2013.  FINDINGS: Two frontal prone radiographs of the thoracolumbar junction, appearing interoperative, radiologist was not present.  A grid has been placed over the thoracolumbar spine, needle markers overlie the L3, L1 vertebral bodies and inferior endplate of T9 and V69. Status post median sternotomy.  IMPRESSION: Limited intraoperative AP views of the thoracolumbar spine demonstrate markers indicating T9, and T11 inferior endplate in the L1, and L3 vertebral bodies.   Electronically Signed   By: Elon Alas   On: 07/17/2013 02:46    ROS: Decreased visual acuity, not new Occasional CP No SOB No ABD pain Mild pain in back over surgical wound No dysuria Tingling/numbnes hands and feet  PHYSICAL EXAM: Blood pressure 145/69, pulse 77, temperature 98.9 F (37.2 C), temperature source Oral, resp. rate 16, height 5\' 6"  (1.676 m), weight 71.5 kg (157 lb 10.1 oz), SpO2 100.00%. HEENT: PERRLA EOMI NECK:No JVD LUNGS:clear CARDIAC:RRR wo MRG ABD:+ BS NTND no HSM EXT:no edema  LUA AVF + bruit NEURO:CNI  Moves all ext.  Able to lift legs above the bed.  Ox3 no asterixis  Assessment: 1. Spinal cord compression sec to destructive lesion T10-11 SP laminectomy 2. DM 3. Anemia 4. Sec HPTH 5. ESRD  On HD [facility], 4hrs, BFR 400, 2K,2.25Ca DW 73kg, Aranesp 43mcg, hect 4 mcg PLAN: 1. Plan HD tomorrow 2. Resume aranesp and hect 3. Resume renavite 4. Check PO4 5. Resume PO4 binder   Shaine Mount T 07/17/2013, 8:57 AM

## 2013-07-17 NOTE — Progress Notes (Signed)
OT Cancellation Note  Patient Details Name: Holly Ellison MRN: [id number] DOB: 02-Dec-1940   Cancelled Treatment:    Reason Eval/Treat Not Completed: Pain limiting ability to participate Attempted to see again. Pt in pain and states she is exhausted. Will attempt to see in am. [place], OTR/L  [phone] 07/17/2013 07/17/2013, 2:01 PM

## 2013-07-17 NOTE — Progress Notes (Signed)
[facility] Progress Note Patient Name: Holly Ellison DOB: 1940/05/20 MRN: [id number]  Date of Service  07/17/2013   HPI/Events of Note   Glu changes needed to ac hs  eICU Interventions  done   Intervention Category Intermediate Interventions: Hyperglycemia - evaluation and treatment  Taunia Frasco J. 07/17/2013, 8:02 PM

## 2013-07-17 NOTE — Consult Note (Signed)
Physical Medicine and Rehabilitation Consult  Reason for Consult: Paraparesis Referring Physician: Dr. Vertell Limber   HPI: Holly Ellison is a 73 y.o. female with history of DM type 2 with neuropathy, CAD, COPD, ESRD, HA,  who was admitted on 07/16/13 pm with one week history of BLE, fecal incontinence and severe back pain. 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 Vertell Limber.   Reports started having weakness a few months ago with limited mobility and falls.    Review of Systems  Constitutional: Positive for malaise/fatigue.  HENT: Negative for hearing loss.   Eyes: Positive for blurred vision. Negative for double vision.  Respiratory: Negative for cough and shortness of breath.   Cardiovascular: Negative for chest pain and palpitations.  Gastrointestinal: Negative for heartburn, nausea and abdominal pain.  Musculoskeletal: Positive for back pain and myalgias.  Neurological: Positive for sensory change (Chronic bilateral hands and legs), focal weakness and weakness. Negative for headaches.    Past Medical History  Diagnosis Date  . Chronic kidney disease     just found out? Placed graft in [facility]-Dr. Tarri Glenn in [facility].  started dialysis in 06/2011  . Arthritis     Osteoarthritis  . Neuropathy in diabetes   . Insomnia   . Asthma   . CAD (coronary artery disease) 2012    7 stents-First PCI was in [place].  The last time she had a procedure was  a stent last year, thena  CABG was undertaken in October [facility])  . GERD (gastroesophageal reflux disease)   . Diabetes mellitus     Type II  . COPD (chronic obstructive pulmonary disease)   . Hypertension   . Peripheral vascular disease     endarterectomy  . Anemia   . CHF (congestive heart failure)   . Coronary artery dilation   . Myocardial infarction 2013   x 3  . 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 H/O Bell's palsy   . Frequent headaches   . Orthostasis     Past Surgical History  Procedure Laterality Date  . Abdominal hysterectomy    . Coronary artery bypass graft  2012    7 stents  . Cea      Left  . Carpal tunnel release    . Av fistula placement    . Grave's disease      ? unclea rif ca or not    Family History  Problem Relation Age of Onset  . Diabetes type II      Social History:  Married. Retired [employer] and moved here a few years ago to live with family. Used walker/cane PTA. Has a supportive family-- Lives with daughter (works nights) and  Son in neighborhood helps with ADLs.  Husband with dementia in [place].  reports that she quit smoking about 17 years ago. Her smoking use included Cigarettes. She has never used smokeless tobacco. She reports that she does not drink alcohol or use illicit drugs.   Allergies  Allergen Reactions  . Dilaudid [Hydromorphone Hcl] Itching  . Morphine Sulfate Itching  . Opium Itching  . Ciprocin-Fluocin-Procin [Fluocinolone Acetonide] Itching   Medications Prior to Admission  Medication Sig Dispense Refill  . albuterol (PROVENTIL HFA;VENTOLIN HFA) 108 (90 BASE) MCG/ACT inhaler Inhale 2 puffs into the lungs every 6 (six) hours as needed  for wheezing.      Marland Kitchen amitriptyline (ELAVIL) 25 MG tablet       . ampicillin (PRINCIPEN) 250 MG capsule       . aspirin EC 81 MG EC tablet Take 1 tablet (81 mg total) by mouth daily.      Marland Kitchen atorvastatin (LIPITOR) 40 MG tablet Take 40 mg by mouth daily.      . clopidogrel (PLAVIX) 75 MG tablet Take 1 tablet (75 mg total) by mouth daily.  30 tablet  0  . gabapentin (NEURONTIN) 100 MG capsule Take 100 mg by mouth 3 (three) times daily.      . insulin aspart (NOVOLOG) 100 unit/mL injection Inject 2 Units into the skin 2 (two) times daily with a meal. Sliding scale: No units if <120, 3 units if 120-150, 4 if 150-180      . LANTUS  SOLOSTAR 100 UNIT/ML Solostar Pen       . lidocaine (XYLOCAINE) 5 % ointment       . methimazole (TAPAZOLE) 10 MG tablet Take 10 mg by mouth daily.      . metolazone (ZAROXOLYN) 2.5 MG tablet Take 2.5 mg by mouth daily as needed.       . metoprolol (LOPRESSOR) 100 MG tablet Take 50 mg by mouth 2 (two) times daily.      . metoprolol succinate (TOPROL-XL) 100 MG 24 hr tablet       . Oxycodone HCl 10 MG TABS Take 10 mg by mouth 3 (three) times daily as needed (Pain).      . pantoprazole (PROTONIX) 40 MG tablet Take 40 mg by mouth daily.      . ranolazine (RANEXA) 500 MG 12 hr tablet Take 500 mg by mouth 2 (two) times daily.      . sertraline (ZOLOFT) 25 MG tablet       . silver sulfADIAZINE (SILVADENE) 1 % cream Apply 1 application topically daily.  50 g  2  . topiramate (TOPAMAX) 25 MG tablet       . zolpidem (AMBIEN) 5 MG tablet         Home: Home Living Living Arrangements: Children  Functional History:   Functional Status:  Mobility:          ADL:    Cognition: Cognition Orientation Level: Oriented X4    Blood pressure 145/69, pulse 77, temperature 98.9 F (37.2 C), temperature source Oral, resp. rate 16, height 5\' 6"  (1.676 m), weight 71.5 kg (157 lb 10.1 oz), SpO2 100.00%. Physical Exam  Vitals reviewed. Constitutional: She is oriented to person, place, and time. She appears well-developed and well-nourished.  In distress due to back pain.  HENT:  Head: Normocephalic and atraumatic.  Eyes: Conjunctivae are normal. Pupils are equal, round, and reactive to light.  Neck: Normal range of motion. Neck supple.  Cardiovascular: Normal rate and regular rhythm.   No murmur heard. Respiratory: Effort normal and breath sounds normal. No respiratory distress. She has no wheezes.  GI: Soft. Bowel sounds are normal. She exhibits no distension. There is no tenderness.  Musculoskeletal: She exhibits no edema.  Neurological: She is alert and oriented to person, place, and time.    Speech clear. Follows commands without difficulty. Diffuse weakness--BUE/LLE>RLE. Bilateral hands with thenar atrophy and contracture with decrease in fine motor movements. Numbness stocking-glove distribution. May have additional thoracic sensory level but difficult to assess given pain levels, anxiety this am.  Skin: Skin is warm and dry.  Psychiatric:  Pleasant but anxious  Results for orders placed during the hospital encounter of 07/16/13 (from the past 24 hour(s))  TYPE AND SCREEN     Status: None   Collection Time    07/16/13  9:15 PM      Result Value Ref Range   ABO/RH(D) O POS     Antibody Screen POS     Sample Expiration 07/19/2013     DAT, IgG NEG     Antibody Identification ANTI M     Unit Number [id number]     Blood Component Type RBC LR PHER2     Unit division 00     Status of Unit ALLOCATED     Donor AG Type NEGATIVE FOR M ANTIGEN     Transfusion Status OK TO TRANSFUSE     Crossmatch Result COMPATIBLE     Unit Number [id number]     Blood Component Type RED CELLS,LR     Unit division 00     Status of Unit ALLOCATED     Donor AG Type NEGATIVE FOR M ANTIGEN     Transfusion Status OK TO TRANSFUSE     Crossmatch Result COMPATIBLE     Unit Number [id number]     Blood Component Type RED CELLS,LR     Unit division 00     Status of Unit ALLOCATED     Donor AG Type NEGATIVE FOR M ANTIGEN     Transfusion Status OK TO TRANSFUSE     Crossmatch Result COMPATIBLE     Unit Number [id number]     Blood Component Type RED CELLS,LR     Unit division 00     Status of Unit ALLOCATED     Transfusion Status OK TO TRANSFUSE     Crossmatch Result COMPATIBLE     Unit Number [id number]     Blood Component Type RED CELLS,LR     Unit division 00     Status of Unit ALLOCATED     Transfusion Status OK TO TRANSFUSE     Crossmatch Result COMPATIBLE    POCT I-STAT 4, (NA,K, GLUC, HGB,HCT)     Status: Abnormal   Collection Time    07/16/13 10:27 PM      Result Value  Ref Range   Sodium 138  137 - 147 mEq/L   Potassium 3.5 (*) 3.7 - 5.3 mEq/L   Glucose, Bld 142 (*) 70 - 99 mg/dL   HCT 32.0 (*) 36.0 - 46.0 %   Hemoglobin 10.9 (*) 12.0 - 15.0 g/dL  GLUCOSE, CAPILLARY     Status: Abnormal   Collection Time    07/16/13 11:39 PM      Result Value Ref Range   Glucose-Capillary 138 (*) 70 - 99 mg/dL  BLOOD PRODUCT ORDER (VERBAL) VERIFICATION     Status: None   Collection Time    07/17/13 12:30 AM      Result Value Ref Range   Blood product order confirm MD AUTHORIZATION REQUESTED    MRSA PCR SCREENING     Status: None   Collection Time    07/17/13  3:16 AM      Result Value Ref Range   MRSA by PCR NEGATIVE  NEGATIVE  GLUCOSE, CAPILLARY     Status: Abnormal   Collection Time    07/17/13  4:24 AM      Result Value Ref Range   Glucose-Capillary 178 (*) 70 - 99 mg/dL   Dg Thoracolumbar Spine  07/17/2013   CLINICAL DATA:  Thoracic laminectomy for tumor.  EXAM: THORACOLUMBAR SPINE - 2 VIEW  COMPARISON:  MRI of the thoracic spine July 16, 2013.  FINDINGS: Two frontal prone radiographs of the thoracolumbar junction, appearing interoperative, radiologist was not present.  A grid has been placed over the thoracolumbar spine, needle markers overlie the L3, L1 vertebral bodies and inferior endplate of T9 and Y17. Status post median sternotomy.  IMPRESSION: Limited intraoperative AP views of the thoracolumbar spine demonstrate markers indicating T9, and T11 inferior endplate in the L1, and L3 vertebral bodies.   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 Does the need for close, 24 hr/day medical supervision in concert with the patient's rehab needs make it unreasonable for this patient to be served in a less intensive setting? Yes 2. Co-Morbidities requiring supervision/potential complications: DM with DPN, copd, esrd, pain 3. Due  to bowel management, safety, skin/wound care, disease management, medication administration, pain management and patient education, does the patient require 24 hr/day rehab nursing? Yes 4. Does the patient require coordinated care of a physician, rehab nurse, PT (1-2 hrs/day, 5 days/week) and OT (1-2 hrs/day, 5 days/week) to address physical and functional deficits in the context of the above medical diagnosis(es)? Yes Addressing deficits in the following areas: balance, endurance, locomotion, strength, transferring, bowel/bladder control, bathing, dressing, feeding, grooming, toileting and psychosocial support 5. Can the patient actively participate in an intensive therapy program of at least 3 hrs of therapy per day at least 5 days per week? Yes 6. The potential for patient to make measurable gains while on inpatient rehab is excellent 7. Anticipated functional outcomes upon discharge from inpatient rehab are supervision and min assist  with PT, supervision and min assist with OT, n/a with SLP. 8. Estimated rehab length of stay to reach the above functional goals is: 15-20 days potentially 9. Does the patient have adequate social supports to accommodate these discharge functional goals? Yes and Potentially 10. Anticipated D/C setting: Home 11. Anticipated post D/C treatments: [facility] 12. Overall Rehab/Functional Prognosis: good  RECOMMENDATIONS: This patient's condition is appropriate for continued rehabilitative care in the following setting: CIR Patient has agreed to participate in recommended program. Potentially Note that insurance prior authorization may be required for reimbursement for recommended care.  Comment: Pt seemed to indicate that she wanted to go to a SNF closer to home. I discussed the potential benefits of CIR vs SNF. She will d/w daughter. We will follow along.   Meredith Staggers, MD, [facility]     07/17/2013

## 2013-07-17 NOTE — Progress Notes (Signed)
Subjective: Patient reports feeling better  Objective: Vital signs in last 24 hours: Temp:  [97 F (36.1 C)-98.9 F (37.2 C)] 98.9 F (37.2 C) (06/10 0700) Pulse Rate:  [75-90] 77 (06/10 0700) Resp:  [12-30] 16 (06/10 0700) BP: (97-171)/(36-78) 145/69 mmHg (06/10 0700) SpO2:  [93 %-100 %] 100 % (06/10 0700) Weight:  [71.5 kg (157 lb 10.1 oz)] 71.5 kg (157 lb 10.1 oz) (06/10 0030)  Intake/Output from previous day: 06/09 0701 - 06/10 0700 In: 1241.2 [I.V.:691.2; IV Piggyback:550] Out: 434 [Urine:34; Drains:50; Blood:350] Intake/Output this shift:    Physical Exam: Strength in lower extremities remarkably better with near normal bilateral lower extremity strength.  Lab Results:  Recent Labs  07/16/13 2227  HGB 10.9*  HCT 32.0*   BMET  Recent Labs  07/16/13 2227  NA 138  K 3.5*  GLUCOSE 142*    Studies/Results: Dg Thoracolumbar Spine  07/17/2013   CLINICAL DATA:  Thoracic laminectomy for tumor.  EXAM: THORACOLUMBAR SPINE - 2 VIEW  COMPARISON:  MRI of the thoracic spine July 16, 2013.  FINDINGS: Two frontal prone radiographs of the thoracolumbar junction, appearing interoperative, radiologist was not present.  A grid has been placed over the thoracolumbar spine, needle markers overlie the L3, L1 vertebral bodies and inferior endplate of T9 and J18. Status post median sternotomy.  IMPRESSION: Limited intraoperative AP views of the thoracolumbar spine demonstrate markers indicating T9, and T11 inferior endplate in the L1, and L3 vertebral bodies.   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  She is much better following decompressive surgery.  Will taper decadron and continue drain until AM.  Chest CT to r/o malignancy, but abdominal CT negative otherwise.  Will need PT/Rehab.  D/W son Dwayne.    LOS: 1 day    Peggyann Shoals,  MD 07/17/2013, 8:39 AM

## 2013-07-17 NOTE — Clinical Social Work Note (Signed)
Clinical Social Worker received standing order referral for possible ST-SNF placement.  Chart reviewed.  PT/OT recommending inpatient rehab consult at this time.  CSW will follow up with inpatient rehab admissions coordinator to confirm patient needs at discharge.    CSW signing off from standing order - please re consult if social work placement needs arise.  Barbette Or, [place]

## 2013-07-17 NOTE — Progress Notes (Signed)
Received pre-screen request for inpatient rehab and noted that rehab MD consult has already been placed. We will proceed once rehab MD consult is completed.  Thanks.  Nanetta Batty, PT Rehabilitation Admissions Coordinator [phone]'

## 2013-07-18 ENCOUNTER — Encounter (HOSPITAL_COMMUNITY): Payer: Self-pay | Admitting: Neurosurgery

## 2013-07-18 DIAGNOSIS — E119 Type 2 diabetes mellitus without complications: Secondary | ICD-10-CM

## 2013-07-18 DIAGNOSIS — M8610 Other acute osteomyelitis, unspecified site: Secondary | ICD-10-CM

## 2013-07-18 DIAGNOSIS — G8220 Paraplegia, unspecified: Secondary | ICD-10-CM

## 2013-07-18 LAB — GLUCOSE, CAPILLARY
GLUCOSE-CAPILLARY: 239 mg/dL — AB (ref 70–99)
GLUCOSE-CAPILLARY: 257 mg/dL — AB (ref 70–99)
Glucose-Capillary: 152 mg/dL — ABNORMAL HIGH (ref 70–99)
Glucose-Capillary: 242 mg/dL — ABNORMAL HIGH (ref 70–99)
Glucose-Capillary: 259 mg/dL — ABNORMAL HIGH (ref 70–99)

## 2013-07-18 LAB — HEMOGLOBIN A1C
Hgb A1c MFr Bld: 6.2 % — ABNORMAL HIGH (ref <5.7)
Mean Plasma Glucose: 131 mg/dL — ABNORMAL HIGH (ref <117)

## 2013-07-18 LAB — RENAL FUNCTION PANEL
ALBUMIN: 3.1 g/dL — AB (ref 3.5–5.2)
BUN: 41 mg/dL — AB (ref 6–23)
CO2: 27 mEq/L (ref 19–32)
Calcium: 9.7 mg/dL (ref 8.4–10.5)
Chloride: 86 mEq/L — ABNORMAL LOW (ref 96–112)
Creatinine, Ser: 4.8 mg/dL — ABNORMAL HIGH (ref 0.50–1.10)
GFR calc non Af Amer: 8 mL/min — ABNORMAL LOW (ref >90)
GFR, EST AFRICAN AMERICAN: 10 mL/min — AB (ref >90)
GLUCOSE: 251 mg/dL — AB (ref 70–99)
POTASSIUM: 4.1 meq/L (ref 3.7–5.3)
Phosphorus: 5.3 mg/dL — ABNORMAL HIGH (ref 2.3–4.6)
Sodium: 132 mEq/L — ABNORMAL LOW (ref 137–147)

## 2013-07-18 LAB — CBC
HEMATOCRIT: 21 % — AB (ref 36.0–46.0)
HEMOGLOBIN: 7.1 g/dL — AB (ref 12.0–15.0)
MCH: 32.4 pg (ref 26.0–34.0)
MCHC: 33.8 g/dL (ref 30.0–36.0)
MCV: 95.9 fL (ref 78.0–100.0)
Platelets: 289 10*3/uL (ref 150–400)
RBC: 2.19 MIL/uL — ABNORMAL LOW (ref 3.87–5.11)
RDW: 14.7 % (ref 11.5–15.5)
WBC: 16 10*3/uL — AB (ref 4.0–10.5)

## 2013-07-18 MED ORDER — DEXAMETHASONE 4 MG PO TABS
4.0000 mg | ORAL_TABLET | Freq: Two times a day (BID) | ORAL | Status: DC
  Administered 2013-07-18 – 2013-07-19 (×2): 4 mg via ORAL
  Filled 2013-07-18 (×3): qty 1

## 2013-07-18 MED ORDER — DEXAMETHASONE 2 MG PO TABS: 2.0000 mg | ORAL_TABLET | Freq: Two times a day (BID) | ORAL | Status: DC

## 2013-07-18 MED ORDER — CALCIUM CARBONATE ANTACID 500 MG PO CHEW
400.0000 mg | CHEWABLE_TABLET | Freq: Three times a day (TID) | ORAL | Status: DC | PRN
  Administered 2013-07-18: 400 mg via ORAL
  Filled 2013-07-18: qty 2

## 2013-07-18 MED ORDER — DOXERCALCIFEROL 4 MCG/2ML IV SOLN
INTRAVENOUS | Status: AC
  Administered 2013-07-18: 4 ug via INTRAVENOUS
  Filled 2013-07-18: qty 2

## 2013-07-18 MED ORDER — INSULIN GLARGINE 100 UNIT/ML ~~LOC~~ SOLN
20.0000 [IU] | Freq: Every day | SUBCUTANEOUS | Status: DC
  Administered 2013-07-18 – 2013-07-19 (×2): 20 [IU] via SUBCUTANEOUS
  Filled 2013-07-18 (×2): qty 0.2

## 2013-07-18 MED ORDER — DIPHENHYDRAMINE HCL 25 MG PO CAPS
25.0000 mg | ORAL_CAPSULE | Freq: Once | ORAL | Status: AC
  Administered 2013-07-18: 25 mg via ORAL
  Filled 2013-07-18: qty 1

## 2013-07-18 MED ORDER — DARBEPOETIN ALFA-POLYSORBATE 25 MCG/0.42ML IJ SOLN
INTRAMUSCULAR | Status: AC
  Administered 2013-07-18: 25 ug via INTRAVENOUS
  Filled 2013-07-18: qty 0.42

## 2013-07-18 NOTE — Progress Notes (Signed)
PULMONARY / CRITICAL CARE MEDICINE   Name: Holly Ellison MRN: [id number] DOB: [birth year]    ADMISSION DATE:  07/16/2013 CONSULTATION DATE:  07/17/13  REFERRING MD :  EDP PRIMARY SERVICE: PCCM  CHIEF COMPLAINT:  Medical management post T11 laminectomy  BRIEF PATIENT DESCRIPTION: 73 y.o. 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  PCCM was consulted for assistance with medical management. PMH - ESRD on HD, CAD, COPD, DM-2 H/o MSSA bacteremia in 07/2012 ,neg TTE S/p 4 wks of abx therapy   SIGNIFICANT EVENTS / STUDIES:  6/9 - to OR for thoracic laminectomy for tumor with decompression of thoracic spinal cord.  LINES / TUBES: Foley 6/9 >>> Thoracic hemovac 6/9 >>>  CULTURES: None  ANTIBIOTICS:    SUBJECTIVE: c/o back pain No dyspnea  VITAL SIGNS: Temp:  [97.5 F (36.4 C)-98.7 F (37.1 C)] 97.5 F (36.4 C) (06/11 0730) Pulse Rate:  [71-86] 80 (06/11 0800) Resp:  [11-25] 14 (06/11 0800) BP: (134-171)/(37-86) 158/52 mmHg (06/11 0800) SpO2:  [91 %-100 %] 94 % (06/11 0800) HEMODYNAMICS:   VENTILATOR SETTINGS:   INTAKE / OUTPUT: Intake/Output     06/10 0701 - 06/11 0700 06/11 0701 - 06/12 0700   P.O. 690 130   I.V. (mL/kg) 230 (3.2) 13.2 (0.2)   Other  60   IV Piggyback 50    Total Intake(mL/kg) 970 (13.6) 203.2 (2.8)   Urine (mL/kg/hr) 30 (0)    Drains     Blood     Total Output 30     Net +940 +203.2        Stool Occurrence 2 x      PHYSICAL EXAMINATION: General: Pleasant female, resting in bed, in NAD. Neuro: A&O x 3, BLes 4/5, UE 5/5 HEENT: [place]/AT. PERRL, sclerae anicteric. Cardiovascular: RRR, no M/R/G.  Lungs: Respirations even and unlabored.  CTA bilaterally, No W/R/R.  Abdomen: BS x 4, soft, NT/ND.  Musculoskeletal: No gross deformities, no edema.  Skin: Intact, warm, no rashes.    LABS:  CBC  Recent Labs Lab 07/17/13 1110 07/17/13 1210 07/18/13 0242  WBC 16.9*  --  16.0*  HGB 4.9*  8.2* 7.1*  HCT 14.3* 24.7* 21.0*  PLT 411*  --  289   Coag's No results found for this basename: APTT, INR,  in the last 168 hours BMET  Recent Labs Lab 07/16/13 2227 07/17/13 1110 07/18/13 0242  NA 138 137 132*  K 3.5* 3.9 4.1  CL  --  90* 86*  CO2  --  23 27  BUN  --  25* 41*  CREATININE  --  3.85* 4.80*  GLUCOSE 142* 197* 251*   Electrolytes  Recent Labs Lab 07/17/13 1110 07/18/13 0242  CALCIUM 9.5 9.7  PHOS  --  5.3*   Sepsis Markers No results found for this basename: LATICACIDVEN, PROCALCITON, O2SATVEN,  in the last 168 hours ABG No results found for this basename: PHART, PCO2ART, PO2ART,  in the last 168 hours Liver Enzymes  Recent Labs Lab 07/17/13 1110 07/18/13 0242  AST 21  --   ALT 10  --   ALKPHOS 96  --   BILITOT 0.3  --   ALBUMIN 3.5 3.1*   Cardiac Enzymes No results found for this basename: TROPONINI, PROBNP,  in the last 168 hours Glucose  Recent Labs Lab 07/17/13 0424 07/17/13 0759 07/17/13 1137 07/17/13 1615 07/17/13 2340 07/18/13 0730  GLUCAP 178* 178* 201* 228* 257* 239*  Imaging Dg Thoracolumbar Spine  07/17/2013   CLINICAL DATA:  Thoracic laminectomy for tumor.  EXAM: THORACOLUMBAR SPINE - 2 VIEW  COMPARISON:  MRI of the thoracic spine July 16, 2013.  FINDINGS: Two frontal prone radiographs of the thoracolumbar junction, appearing interoperative, radiologist was not present.  A grid has been placed over the thoracolumbar spine, needle markers overlie the L3, L1 vertebral bodies and inferior endplate of T9 and N82. Status post median sternotomy.  IMPRESSION: Limited intraoperative AP views of the thoracolumbar spine demonstrate markers indicating T9, and T11 inferior endplate in the L1, and L3 vertebral bodies.   Electronically Signed   By: Elon Alas   On: 07/17/2013 02:46   Ct Chest Wo Contrast  07/17/2013   CLINICAL DATA:  Status post thoracic laminectomy 07/16/2013 for 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 Heavy smoker. Evaluate for  mass. Biopsy results unavailable.  EXAM: CT CHEST WITHOUT CONTRAST  TECHNIQUE: Multidetector CT imaging of the chest was performed following the standard protocol without IV contrast.  COMPARISON:  Thoracic MRI 07/16/2013. Abdominal pelvic CT 07/10/2013.  FINDINGS: There is extensive atherosclerosis of the aorta, great vessels and coronary arteries status post median sternotomy and CABG. Small mediastinal lymph nodes are not pathologically enlarged. The heart size is normal. There is no significant pleural or pericardial effusion.  The lungs demonstrate mild emphysema and asymmetric subpleural fibrotic changes within the right lung, possibly due to prior radiation therapy. There is no evidence of pulmonary mass, endobronchial lesion or confluent airspace opacity. Thyromegaly is noted.  The visualized upper abdomen has a stable appearance without suspicious findings.  There are interval postsurgical changes status post T10-12 laminectomy. Posterior epidural drain is in place. Large (2.9 cm) central lucency within the T11 vertebral body and surrounding sclerosis are unchanged. The lucency extends to the superior endplate. There is no endplate destruction or collapse. Mild endplate changes are present on the left at T10 without endplate destruction. A small amount of anterior paraspinal soft tissue thickening is unchanged. No other osseous lesions are evident.  IMPRESSION: 1. Interval T10-12 laminectomy without demonstrated complication. The spinal canal appears adequately decompressed. 2. Underlying lucency within T11 vertebral body and surrounding sclerosis are unchanged. In addition to the clinically considered indolent infection as the etiology for this process, amyloid deposition should be considered given the patient's history of end-stage renal disease. 3. No evidence of primary malignancy within the chest or extra osseous metastatic disease.   Electronically Signed   By: Camie Patience M.D.   On: 07/17/2013 13:45      ASSESSMENT / PLAN:  PULMONARY A: Hx COPD, heavy ex smoker CT chest neg P:   - Albuterol PRN. - Pulmonary toiletry.   CARDIOVASCULAR A:  Hx HTN Hx CAD Hx HLD P:  - Resume home meds.  RENAL A:   ESRD - normally dialyses T/Th/Sat. P:   - Renal informed - HD today - NS KVO.   GASTROINTESTINAL A:   Nutrition P:   - Renal diet. - Pantoprazole.  HEMATOLOGIC A: Anemia -acute blood loss + chronic disease related P:  - VTE Proph:  SCD's. - CBC in AM.  INFECTIOUS A:   No acute issues. H/o MSSA bacteremia in 07/2012 ,neg TTE  S/p 4 wks of abx therapy P:   - chk blood cx, Can dc ancef if cx neg  ENDOCRINE A:   DM  Hyperthyroidism P:   - CBG's ac/hs - SSI, increase lantus full dose . - ct  methimazole.  NEUROLOGIC A:   S/p  T11 laminectomy / decompression of spinal cord. Acute encephalopathy after anesthesia P:   - Per neurosurgery -prelim neg for malignancy, doubt infectious -PT consult  OK to transfer out of ICU in my opinion, Advise age appropriate screening for maliognancy as outpt - mamogram, colonoscopy  I have personally obtained a history, examined the patient, evaluated laboratory and imaging results, formulated the assessment and plan and placed orders.   Kara Mead MD. [place] Flood. [place] Pager [phone] If no response call [phone]      07/18/2013, 9:17 AM

## 2013-07-18 NOTE — Progress Notes (Signed)
Inpatient Diabetes Program Recommendations  AACE/ADA: New Consensus Statement on Inpatient Glycemic Control (2013)  Target Ranges:  Prepandial:   less than 140 mg/dL      Peak postprandial:   less than 180 mg/dL (1-2 hours)      Critically ill patients:  140 - 180 mg/dL  Results for Holly Ellison, Holly Ellison (MRN [id number]) as of 07/18/2013 11:44  Ref. Range 07/17/2013 07:59 07/17/2013 11:37 07/17/2013 16:15 07/17/2013 23:40 07/18/2013 07:30  Glucose-Capillary Latest Range: 70-99 mg/dL 178 (H) 201 (H) 228 (H) 257 (H) 239 (H)   Inpatient Diabetes Program Recommendations Insulin - Basal: consider increasing Lantus to home dose 28 units  Correction (SSI): increase to moderate scale during steroid therapy Thank you  Raoul Pitch BSN, RN,CDE Inpatient Diabetes Coordinator [phone] (team pager)

## 2013-07-18 NOTE — Progress Notes (Signed)
S: No new CO.  Discussed role of transfusion and she prefers to hold off for now O:BP 150/68  Pulse 77  Temp(Src) 98.2 F (36.8 C) (Oral)  Resp 13  Ht 5\' 6"  (1.676 m)  Wt 71.5 kg (157 lb 10.1 oz)  BMI 25.45 kg/m2  SpO2 98%  Intake/Output Summary (Last 24 hours) at 07/18/13 0713 Last data filed at 07/18/13 0600  Gross per 24 hour  Intake    820 ml  Output     30 ml  Net    790 ml   Weight change:  TMH:DQQIW and alert CVS:RRR Resp:clear Abd:+ BS NTND Ext: no edema LUA AVF + bruit NEURO:CNI Able to lift legs off bed.  Ox3 no asterixis.  No incontinence   . aspirin EC  81 mg Oral Daily  . atorvastatin  40 mg Oral Daily  . calcium carbonate  2 tablet Oral TID WC  .  ceFAZolin (ANCEF) IV  2 g Intravenous Q T,Th,Sa-HD  . clopidogrel  75 mg Oral Daily  . darbepoetin (ARANESP) injection - DIALYSIS  25 mcg Intravenous Q Thu-HD  . dexamethasone  4 mg Intravenous 4 times per day   Or  . dexamethasone  4 mg Oral 4 times per day  . docusate sodium  100 mg Oral BID  . doxercalciferol  4 mcg Intravenous Q T,Th,Sa-HD  . gabapentin  100 mg Oral TID  . insulin aspart  0-9 Units Subcutaneous TID WC  . insulin glargine  10 Units Subcutaneous Daily  . methimazole  10 mg Oral Daily  . metoprolol  50 mg Oral BID  . multivitamin  1 tablet Oral QHS  . pantoprazole  40 mg Oral Daily  . ranolazine  500 mg Oral BID  . sodium chloride  3 mL Intravenous Q12H   Dg Thoracolumbar Spine  07/17/2013   CLINICAL DATA:  Thoracic laminectomy for tumor.  EXAM: THORACOLUMBAR SPINE - 2 VIEW  COMPARISON:  MRI of the thoracic spine July 16, 2013.  FINDINGS: Two frontal prone radiographs of the thoracolumbar junction, appearing interoperative, radiologist was not present.  A grid has been placed over the thoracolumbar spine, needle markers overlie the L3, L1 vertebral bodies and inferior endplate of T9 and L79. Status post median sternotomy.  IMPRESSION: Limited intraoperative AP views of the thoracolumbar spine  demonstrate markers indicating T9, and T11 inferior endplate in the L1, and L3 vertebral bodies.   Electronically Signed   By: Elon Alas   On: 07/17/2013 02:46   Ct Chest Wo Contrast  07/17/2013   CLINICAL DATA:  Status post thoracic laminectomy 07/16/2013 for 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 Heavy smoker. Evaluate for mass. Biopsy results unavailable.  EXAM: CT CHEST WITHOUT CONTRAST  TECHNIQUE: Multidetector CT imaging of the chest was performed following the standard protocol without IV contrast.  COMPARISON:  Thoracic MRI 07/16/2013. Abdominal pelvic CT 07/10/2013.  FINDINGS: There is extensive atherosclerosis of the aorta, great vessels and coronary arteries status post median sternotomy and CABG. Small mediastinal lymph nodes are not pathologically enlarged. The heart size is normal. There is no significant pleural or pericardial effusion.  The lungs demonstrate mild emphysema and asymmetric subpleural fibrotic changes within the right lung, possibly due to prior radiation therapy. There is no evidence of pulmonary mass, endobronchial lesion or confluent airspace opacity. Thyromegaly is noted.  The visualized upper abdomen has a stable appearance without suspicious findings.  There are interval postsurgical changes status post T10-12 laminectomy. Posterior epidural drain is in place. Large (2.9 cm)  central lucency within the T11 vertebral body and surrounding sclerosis are unchanged. The lucency extends to the superior endplate. There is no endplate destruction or collapse. Mild endplate changes are present on the left at T10 without endplate destruction. A small amount of anterior paraspinal soft tissue thickening is unchanged. No other osseous lesions are evident.  IMPRESSION: 1. Interval T10-12 laminectomy without demonstrated complication. The spinal canal appears adequately decompressed. 2. Underlying lucency within T11 vertebral body and surrounding sclerosis are unchanged. In addition to the clinically  considered indolent infection as the etiology for this process, amyloid deposition should be considered given the patient's history of end-stage renal disease. 3. No evidence of primary malignancy within the chest or extra osseous metastatic disease.   Electronically Signed   By: Camie Patience M.D.   On: 07/17/2013 13:45   BMET    Component Value Date/Time   NA 132* 07/18/2013 0242   K 4.1 07/18/2013 0242   CL 86* 07/18/2013 0242   CO2 27 07/18/2013 0242   GLUCOSE 251* 07/18/2013 0242   BUN 41* 07/18/2013 0242   CREATININE 4.80* 07/18/2013 0242   CALCIUM 9.7 07/18/2013 0242   GFRNONAA 8* 07/18/2013 0242   GFRAA 10* 07/18/2013 0242   CBC    Component Value Date/Time   WBC 16.0* 07/18/2013 0242   RBC 2.19* 07/18/2013 0242   RBC 2.66* 06/05/2011 0156   HGB 7.1* 07/18/2013 0242   HCT 21.0* 07/18/2013 0242   PLT 289 07/18/2013 0242   MCV 95.9 07/18/2013 0242   MCH 32.4 07/18/2013 0242   MCHC 33.8 07/18/2013 0242   RDW 14.7 07/18/2013 0242   LYMPHSABS 1.5 07/17/2013 1110   MONOABS 0.3 07/17/2013 1110   EOSABS 0.0 07/17/2013 1110   BASOSABS 0.0 07/17/2013 1110     Assessment: 1. 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 DM 3. Anemia on aranesp 4. 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 ESRD  Plan: 1. HD today.  Will hold off on transfusion.  If Hg drops further then can transfuse with HD on Sat.   Holly Ellison T

## 2013-07-18 NOTE — Progress Notes (Signed)
Rehab admissions - I met with pt and explained the purpose and possibility of inpatient rehab. Questions were answered and informational brochures were given. Pt is willing to consider going to inpatient rehab and wanted to discuss things with her son and daughter. No family present at this time and son was driving to the hospital at this time per pt.  I tried to contact pt's daughter by phone but was unable to leave a voicemail.  I will follow up with pt and family tomorrow to see if pt/family would like to pursue inpatient rehab. Rehab MD feels that pt is a good rehab candidate.  Please call me with any questions. Thanks.  Nanetta Batty, PT Rehabilitation Admissions Coordinator [phone]

## 2013-07-18 NOTE — Progress Notes (Addendum)
OT Cancellation Note  Patient Details Name: Holly Ellison MRN: [id number] DOB: [birth year]   Cancelled Treatment:    Reason Eval/Treat Not Completed: Patient at procedure or test/ unavailable (HD)Nsg states she will be inHD until 4 pm. Will see in am  [place], OTR/L  [phone] 07/18/2013 07/18/2013, 12:01 PM

## 2013-07-18 NOTE — Procedures (Signed)
Pt seen on HD.  Ap 120 Vp 180  BFR 400.  Just getting HD started.

## 2013-07-18 NOTE — Progress Notes (Signed)
Subjective: Patient reports "I'm a little sore where he did surgery, but that's all"  Objective: Vital signs in last 24 hours: Temp:  [97.5 F (36.4 C)-98.7 F (37.1 C)] 97.5 F (36.4 C) (06/11 0730) Pulse Rate:  [71-86] 76 (06/11 1000) Resp:  [11-25] 13 (06/11 1000) BP: (113-171)/(37-86) 141/76 mmHg (06/11 1000) SpO2:  [91 %-100 %] 94 % (06/11 1000)  Intake/Output from previous day: 06/10 0701 - 06/11 0700 In: 970 [P.O.:690; I.V.:230; IV Piggyback:50] Out: 30 [Urine:30] Intake/Output this shift: Total I/O In: 321.2 [P.O.:248; I.V.:13.2; Other:60] Out: -   Alert, conversant, smiling and cooperative. Reports incisional soreness. Site with honeycomb drsg. No erythema, swelling, or drainage. Hemovac with trace blood. MAEW. CT reviewed by DrStern, no evidence of metastatic disease; ?osteomyelitis.   Lab Results:  Recent Labs  07/17/13 1110 07/17/13 1210 07/18/13 0242  WBC 16.9*  --  16.0*  HGB 4.9* 8.2* 7.1*  HCT 14.3* 24.7* 21.0*  PLT 411*  --  289   BMET  Recent Labs  07/17/13 1110 07/18/13 0242  NA 137 132*  K 3.9 4.1  CL 90* 86*  CO2 23 27  GLUCOSE 197* 251*  BUN 25* 41*  CREATININE 3.85* 4.80*  CALCIUM 9.5 9.7    Studies/Results: Dg Thoracolumbar Spine  07/17/2013   CLINICAL DATA:  Thoracic laminectomy for tumor.  EXAM: THORACOLUMBAR SPINE - 2 VIEW  COMPARISON:  MRI of the thoracic spine July 16, 2013.  FINDINGS: Two frontal prone radiographs of the thoracolumbar junction, appearing interoperative, radiologist was not present.  A grid has been placed over the thoracolumbar spine, needle markers overlie the L3, L1 vertebral bodies and inferior endplate of T9 and Z61. Status post median sternotomy.  IMPRESSION: Limited intraoperative AP views of the thoracolumbar spine demonstrate markers indicating T9, and T11 inferior endplate in the L1, and L3 vertebral bodies.   Electronically Signed   By: Elon Alas   On: 07/17/2013 02:46   Ct Chest Wo  Contrast  07/17/2013   CLINICAL DATA:  Status post thoracic laminectomy 07/16/2013 for 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 Heavy smoker. Evaluate for mass. Biopsy results unavailable.  EXAM: CT CHEST WITHOUT CONTRAST  TECHNIQUE: Multidetector CT imaging of the chest was performed following the standard protocol without IV contrast.  COMPARISON:  Thoracic MRI 07/16/2013. Abdominal pelvic CT 07/10/2013.  FINDINGS: There is extensive atherosclerosis of the aorta, great vessels and coronary arteries status post median sternotomy and CABG. Small mediastinal lymph nodes are not pathologically enlarged. The heart size is normal. There is no significant pleural or pericardial effusion.  The lungs demonstrate mild emphysema and asymmetric subpleural fibrotic changes within the right lung, possibly due to prior radiation therapy. There is no evidence of pulmonary mass, endobronchial lesion or confluent airspace opacity. Thyromegaly is noted.  The visualized upper abdomen has a stable appearance without suspicious findings.  There are interval postsurgical changes status post T10-12 laminectomy. Posterior epidural drain is in place. Large (2.9 cm) central lucency within the T11 vertebral body and surrounding sclerosis are unchanged. The lucency extends to the superior endplate. There is no endplate destruction or collapse. Mild endplate changes are present on the left at T10 without endplate destruction. A small amount of anterior paraspinal soft tissue thickening is unchanged. No other osseous lesions are evident.  IMPRESSION: 1. Interval T10-12 laminectomy without demonstrated complication. The spinal canal appears adequately decompressed. 2. Underlying lucency within T11 vertebral body and surrounding sclerosis are unchanged. In addition to the clinically considered indolent infection as the etiology for this  process, amyloid deposition should be considered given the patient's history of end-stage renal disease. 3. No evidence of primary  malignancy within the chest or extra osseous metastatic disease.   Electronically Signed   By: Camie Patience M.D.   On: 07/17/2013 13:45    Assessment/Plan: Doing well 12hrs post-op    LOS: 2 days  Discussions in process CIR vs SNF near pts home.  Per DrStern, ok to transfer to med-surg floor (6E renal); taper decadron to 4mg  BIDx2d, then 2mg  BIDx2d, then stop. Hemovac pulled, dsd to site. Mobilize as tolerated.   Holly Ellison 07/18/2013, 10:43 AM

## 2013-07-18 NOTE — Progress Notes (Signed)
Patient complaining of generalized itching. ELink notified. Thayer Ohm D

## 2013-07-18 NOTE — Progress Notes (Signed)
Physical Therapy Treatment Patient Details Name: Holly Ellison MRN: [id number] DOB: 1941-01-05 Today's Date: 07/18/2013    History of Present Illness Patient is 73 year old female with ESRD on dialysis with 1 week h/o progressive weakness in legs and fecal incontinence and severe back pain.  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    PT Comments    Pt very fatigued from dialysis, however agreed to at least try sitting on the EOB. Once there, she agreed to try to stand, and one there agreed to pivot to the chair. Pt obviously proud of her accomplishment! Making progress. Lengthy discussion with pt and son re: discharge options.   Follow Up Recommendations  CIR     Equipment Recommendations   (TBA)    Recommendations for Other Services       Precautions / Restrictions Precautions Precautions: Back;Fall Precaution Comments: educated on back precautions for comfort Restrictions Weight Bearing Restrictions: No    Mobility  Bed Mobility Overal bed mobility: Needs Assistance Bed Mobility: Rolling;Sidelying to Sit Rolling: Min assist Sidelying to sit: Min assist (with rail)       General bed mobility comments: VCs for technique and positioning; difficulty sequencing how to scoot out to EOB  Transfers Overall transfer level: Needs assistance Equipment used: Rolling walker (2 wheeled) Transfers: Sit to/from [facility] Sit to Stand: Min assist Stand pivot transfers: Min assist       General transfer comment: VCs for hand placement and safety, assist for elevation and stability; assist and max cues for walker placement as turning; assist to control descent  Ambulation/Gait             General Gait Details: too fatigued and unsafe to ambulate with  available +1 assist (need 2 people)   Stairs            Wheelchair Mobility    Modified Rankin (Stroke Patients Only)       Balance           Standing balance support: Bilateral upper extremity supported;During functional activity Standing balance-Leahy Scale: Poor                      Cognition Arousal/Alertness: Awake/alert Behavior During Therapy: WFL for tasks assessed/performed Overall Cognitive Status: Impaired/Different from baseline Area of Impairment: Awareness;Problem solving;Memory     Memory: Decreased short-term memory;Decreased recall of precautions   Safety/Judgement: Decreased awareness of safety Awareness: Emergent Problem Solving: Slow processing;Decreased initiation;Difficulty sequencing;Requires verbal cues;Requires tactile cues      Exercises      General Comments General comments (skin integrity, edema, etc.): Son present and asking about difference between CIR, SNF, and ALF. Intensity and duration of therapies explained. Son stated he wants his mom to go to CIR, if possible.      Pertinent Vitals/Pain Pre-medicated for pain ~40 minutes prior to session; back pain 8/10 per pt BP prior 136/72    Home Living                      Prior Function            PT Goals (current goals can now be found in the care plan section) Acute Rehab PT Goals Patient Stated Goal: to not have this pain Progress towards  PT goals: Progressing toward goals    Frequency  Min 5X/week    PT Plan Current plan remains appropriate    Co-evaluation             End of Session Equipment Utilized During Treatment: Gait belt Activity Tolerance: Patient limited by fatigue;Patient limited by pain Patient left: in chair;with call bell/phone within reach;with nursing/sitter in room;with family/visitor present     Time: 4097-3532 PT Time Calculation (min): 25 min  Charges:  $Therapeutic Activity: 23-37 mins                    G  Codes:      Caralee Morea Aug 02, 2013, 6:30 PM Pager [phone]

## 2013-07-18 NOTE — Progress Notes (Signed)
eLink Physician-Brief Progress Note Patient Name: Holly Ellison DOB: 1940/04/28 MRN: [id number]  Date of Service  07/18/2013   HPI/Events of Note   itching  eICU Interventions  Benadryl x 1 Pharmacy to clear this due to allergy alert on procainamide   Intervention Category Minor Interventions: Other:  [name] 07/18/2013, 2:19 AM

## 2013-07-18 NOTE — Progress Notes (Signed)
[facility] Progress Note Patient Name: Holly Ellison DOB: 20-Dec-1940 MRN: [id number]  Date of Service  07/18/2013   HPI/Events of Note   Baseline TUMS use. Wants TUMS  eICU Interventions  tums   Intervention Category Minor Interventions: Other:  Monee Dembeck 07/18/2013, 1:00 AM

## 2013-07-18 NOTE — Progress Notes (Signed)
Rehab admissions - I am following pt's case and reviewed pt's chart. I tried to meet with pt in follow up to rehab MD consult but pt had just left for dialysis.  I will follow up with pt later this afternoon as able. Per chart review, pt is needing to discuss possible rehab options including inpatient rehab versus a SNF closer to home.  Please call me with any questions. Thanks.  Nanetta Batty, PT Rehabilitation Admissions Coordinator [phone]

## 2013-07-18 NOTE — Progress Notes (Signed)
PT Cancellation Note  Patient Details Name: Holly Ellison MRN: [id number] DOB: 10-Jan-1941   Cancelled Treatment:    Reason Eval/Treat Not Completed: Medical issues which prohibited therapy (pt going to hemodialysis.).  Will check back tomorrow.  Thanks.   INGOLD,Zakariyah Freimark 07/18/2013, 11:06 AM [facility] [phone] (pager)

## 2013-07-19 ENCOUNTER — Inpatient Hospital Stay (HOSPITAL_COMMUNITY)
Admission: RE | Admit: 2013-07-19 | Discharge: 2013-08-15 | DRG: 945 | Disposition: A | Discharge status: Skilled Nursing Facility | Payer: Medicare Other | Source: Intra-hospital | Attending: Physical Medicine & Rehabilitation | Admitting: Physical Medicine & Rehabilitation

## 2013-07-19 DIAGNOSIS — D166 Benign neoplasm of vertebral column: Secondary | ICD-10-CM

## 2013-07-19 DIAGNOSIS — I10 Essential (primary) hypertension: Secondary | ICD-10-CM | POA: Diagnosis present

## 2013-07-19 DIAGNOSIS — E0590 Thyrotoxicosis, unspecified without thyrotoxic crisis or storm: Secondary | ICD-10-CM

## 2013-07-19 DIAGNOSIS — I120 Hypertensive chronic kidney disease with stage 5 chronic kidney disease or end stage renal disease: Secondary | ICD-10-CM | POA: Diagnosis not present

## 2013-07-19 DIAGNOSIS — D62 Acute posthemorrhagic anemia: Secondary | ICD-10-CM | POA: Diagnosis not present

## 2013-07-19 DIAGNOSIS — Z9889 Other specified postprocedural states: Secondary | ICD-10-CM | POA: Diagnosis not present

## 2013-07-19 DIAGNOSIS — E1142 Type 2 diabetes mellitus with diabetic polyneuropathy: Secondary | ICD-10-CM | POA: Diagnosis not present

## 2013-07-19 DIAGNOSIS — F0391 Unspecified dementia with behavioral disturbance: Secondary | ICD-10-CM

## 2013-07-19 DIAGNOSIS — N2889 Other specified disorders of kidney and ureter: Secondary | ICD-10-CM

## 2013-07-19 DIAGNOSIS — F3289 Other specified depressive episodes: Secondary | ICD-10-CM | POA: Diagnosis not present

## 2013-07-19 DIAGNOSIS — E1169 Type 2 diabetes mellitus with other specified complication: Secondary | ICD-10-CM | POA: Diagnosis not present

## 2013-07-19 DIAGNOSIS — G629 Polyneuropathy, unspecified: Secondary | ICD-10-CM

## 2013-07-19 DIAGNOSIS — D72829 Elevated white blood cell count, unspecified: Secondary | ICD-10-CM | POA: Diagnosis not present

## 2013-07-19 DIAGNOSIS — F29 Unspecified psychosis not due to a substance or known physiological condition: Secondary | ICD-10-CM

## 2013-07-19 DIAGNOSIS — E0829 Diabetes mellitus due to underlying condition with other diabetic kidney complication: Secondary | ICD-10-CM

## 2013-07-19 DIAGNOSIS — I25810 Atherosclerosis of coronary artery bypass graft(s) without angina pectoris: Secondary | ICD-10-CM

## 2013-07-19 DIAGNOSIS — N186 End stage renal disease: Secondary | ICD-10-CM

## 2013-07-19 DIAGNOSIS — F03918 Unspecified dementia, unspecified severity, with other behavioral disturbance: Secondary | ICD-10-CM

## 2013-07-19 DIAGNOSIS — G9520 Unspecified cord compression: Secondary | ICD-10-CM

## 2013-07-19 DIAGNOSIS — E119 Type 2 diabetes mellitus without complications: Secondary | ICD-10-CM | POA: Diagnosis present

## 2013-07-19 DIAGNOSIS — I2510 Atherosclerotic heart disease of native coronary artery without angina pectoris: Secondary | ICD-10-CM | POA: Diagnosis not present

## 2013-07-19 DIAGNOSIS — G43909 Migraine, unspecified, not intractable, without status migrainosus: Secondary | ICD-10-CM

## 2013-07-19 DIAGNOSIS — E1149 Type 2 diabetes mellitus with other diabetic neurological complication: Secondary | ICD-10-CM

## 2013-07-19 DIAGNOSIS — K5900 Constipation, unspecified: Secondary | ICD-10-CM | POA: Diagnosis not present

## 2013-07-19 DIAGNOSIS — E1140 Type 2 diabetes mellitus with diabetic neuropathy, unspecified: Secondary | ICD-10-CM

## 2013-07-19 DIAGNOSIS — G8220 Paraplegia, unspecified: Secondary | ICD-10-CM | POA: Diagnosis not present

## 2013-07-19 DIAGNOSIS — F411 Generalized anxiety disorder: Secondary | ICD-10-CM | POA: Diagnosis not present

## 2013-07-19 DIAGNOSIS — I25709 Atherosclerosis of coronary artery bypass graft(s), unspecified, with unspecified angina pectoris: Secondary | ICD-10-CM

## 2013-07-19 DIAGNOSIS — J4489 Other specified chronic obstructive pulmonary disease: Secondary | ICD-10-CM

## 2013-07-19 DIAGNOSIS — Z5189 Encounter for other specified aftercare: Secondary | ICD-10-CM | POA: Diagnosis present

## 2013-07-19 DIAGNOSIS — R5381 Other malaise: Secondary | ICD-10-CM

## 2013-07-19 DIAGNOSIS — F329 Major depressive disorder, single episode, unspecified: Secondary | ICD-10-CM

## 2013-07-19 DIAGNOSIS — Z951 Presence of aortocoronary bypass graft: Secondary | ICD-10-CM

## 2013-07-19 DIAGNOSIS — Z9861 Coronary angioplasty status: Secondary | ICD-10-CM | POA: Diagnosis not present

## 2013-07-19 DIAGNOSIS — F0390 Unspecified dementia without behavioral disturbance: Secondary | ICD-10-CM

## 2013-07-19 DIAGNOSIS — J410 Simple chronic bronchitis: Secondary | ICD-10-CM

## 2013-07-19 DIAGNOSIS — J449 Chronic obstructive pulmonary disease, unspecified: Secondary | ICD-10-CM | POA: Diagnosis not present

## 2013-07-19 DIAGNOSIS — N2581 Secondary hyperparathyroidism of renal origin: Secondary | ICD-10-CM

## 2013-07-19 DIAGNOSIS — Z992 Dependence on renal dialysis: Secondary | ICD-10-CM

## 2013-07-19 DIAGNOSIS — I151 Hypertension secondary to other renal disorders: Secondary | ICD-10-CM

## 2013-07-19 DIAGNOSIS — I159 Secondary hypertension, unspecified: Secondary | ICD-10-CM

## 2013-07-19 LAB — GLUCOSE, CAPILLARY
GLUCOSE-CAPILLARY: 231 mg/dL — AB (ref 70–99)
GLUCOSE-CAPILLARY: 295 mg/dL — AB (ref 70–99)
Glucose-Capillary: 184 mg/dL — ABNORMAL HIGH (ref 70–99)
Glucose-Capillary: 208 mg/dL — ABNORMAL HIGH (ref 70–99)

## 2013-07-19 MED ORDER — RANOLAZINE ER 500 MG PO TB12
500.0000 mg | ORAL_TABLET | Freq: Two times a day (BID) | ORAL | Status: DC
  Administered 2013-07-19 – 2013-08-15 (×52): 500 mg via ORAL
  Filled 2013-07-19 (×60): qty 1

## 2013-07-19 MED ORDER — LIDOCAINE-PRILOCAINE 2.5-2.5 % EX CREA
1.0000 "application " | TOPICAL_CREAM | CUTANEOUS | Status: DC | PRN
  Filled 2013-07-19: qty 5

## 2013-07-19 MED ORDER — ASPIRIN EC 81 MG PO TBEC
81.0000 mg | DELAYED_RELEASE_TABLET | Freq: Every day | ORAL | Status: DC
  Administered 2013-07-20 – 2013-08-15 (×26): 81 mg via ORAL
  Filled 2013-07-19 (×30): qty 1

## 2013-07-19 MED ORDER — DOXERCALCIFEROL 4 MCG/2ML IV SOLN
4.0000 ug | INTRAVENOUS | Status: DC
  Administered 2013-07-20: 4 ug via INTRAVENOUS
  Filled 2013-07-19 (×2): qty 2

## 2013-07-19 MED ORDER — PHENOL 1.4 % MT LIQD
1.0000 | OROMUCOSAL | Status: DC | PRN
  Filled 2013-07-19: qty 177

## 2013-07-19 MED ORDER — INSULIN GLARGINE 100 UNIT/ML ~~LOC~~ SOLN: 28.0000 [IU] | Freq: Every day | SUBCUTANEOUS | Status: DC

## 2013-07-19 MED ORDER — ALTEPLASE 2 MG IJ SOLR
2.0000 mg | Freq: Once | INTRAMUSCULAR | Status: AC | PRN
  Filled 2013-07-19: qty 2

## 2013-07-19 MED ORDER — DOCUSATE SODIUM 100 MG PO CAPS
100.0000 mg | ORAL_CAPSULE | Freq: Two times a day (BID) | ORAL | Status: DC
  Administered 2013-07-19 – 2013-08-15 (×52): 100 mg via ORAL
  Filled 2013-07-19 (×58): qty 1

## 2013-07-19 MED ORDER — ACETAMINOPHEN 325 MG PO TABS
325.0000 mg | ORAL_TABLET | ORAL | Status: DC | PRN
  Administered 2013-07-20 – 2013-08-14 (×36): 650 mg via ORAL
  Filled 2013-07-19 (×36): qty 2

## 2013-07-19 MED ORDER — CLOPIDOGREL BISULFATE 75 MG PO TABS
75.0000 mg | ORAL_TABLET | Freq: Every day | ORAL | Status: DC
  Administered 2013-07-20 – 2013-08-15 (×26): 75 mg via ORAL
  Filled 2013-07-19 (×28): qty 1

## 2013-07-19 MED ORDER — INSULIN GLARGINE 100 UNIT/ML ~~LOC~~ SOLN
28.0000 [IU] | Freq: Every day | SUBCUTANEOUS | Status: DC
  Administered 2013-07-20 – 2013-07-23 (×4): 28 [IU] via SUBCUTANEOUS
  Filled 2013-07-19 (×4): qty 0.28

## 2013-07-19 MED ORDER — MENTHOL 3 MG MT LOZG
1.0000 | LOZENGE | OROMUCOSAL | Status: DC | PRN
  Filled 2013-07-19: qty 9

## 2013-07-19 MED ORDER — DEXAMETHASONE 4 MG PO TABS
4.0000 mg | ORAL_TABLET | Freq: Two times a day (BID) | ORAL | Status: AC
  Administered 2013-07-19 – 2013-07-20 (×3): 4 mg via ORAL
  Filled 2013-07-19 (×4): qty 1

## 2013-07-19 MED ORDER — ALBUTEROL SULFATE (2.5 MG/3ML) 0.083% IN NEBU: 3.0000 mL | INHALATION_SOLUTION | RESPIRATORY_TRACT | Status: DC | PRN

## 2013-07-19 MED ORDER — DEXAMETHASONE 2 MG PO TABS
2.0000 mg | ORAL_TABLET | Freq: Two times a day (BID) | ORAL | Status: AC
  Administered 2013-07-21 – 2013-07-22 (×4): 2 mg via ORAL
  Filled 2013-07-19 (×5): qty 1

## 2013-07-19 MED ORDER — PENTAFLUOROPROP-TETRAFLUOROETH EX AERO: 1.0000 "application " | INHALATION_SPRAY | CUTANEOUS | Status: DC | PRN

## 2013-07-19 MED ORDER — GABAPENTIN 100 MG PO CAPS
100.0000 mg | ORAL_CAPSULE | Freq: Three times a day (TID) | ORAL | Status: DC
  Administered 2013-07-19 – 2013-07-28 (×25): 100 mg via ORAL
  Filled 2013-07-19 (×30): qty 1

## 2013-07-19 MED ORDER — GUAIFENESIN-DM 100-10 MG/5ML PO SYRP
5.0000 mL | ORAL_SOLUTION | Freq: Four times a day (QID) | ORAL | Status: DC | PRN
  Filled 2013-07-19: qty 10

## 2013-07-19 MED ORDER — CALCIUM CARBONATE ANTACID 500 MG PO CHEW
2.0000 | CHEWABLE_TABLET | Freq: Three times a day (TID) | ORAL | Status: DC
  Administered 2013-07-19 – 2013-07-28 (×24): 400 mg via ORAL
  Filled 2013-07-19 (×28): qty 2

## 2013-07-19 MED ORDER — NEPRO/CARBSTEADY PO LIQD: 237.0000 mL | ORAL | Status: DC | PRN

## 2013-07-19 MED ORDER — RENA-VITE PO TABS
1.0000 | ORAL_TABLET | Freq: Every day | ORAL | Status: DC
  Administered 2013-07-19 – 2013-07-30 (×10): 1 via ORAL
  Administered 2013-07-31: 22:00:00 via ORAL
  Administered 2013-08-01 – 2013-08-05 (×5): 1 via ORAL
  Administered 2013-08-06: 21:00:00 via ORAL
  Administered 2013-08-07 – 2013-08-14 (×8): 1 via ORAL
  Filled 2013-07-19 (×30): qty 1

## 2013-07-19 MED ORDER — SODIUM CHLORIDE 0.9 % IV SOLN: 100.0000 mL | INTRAVENOUS | Status: DC | PRN

## 2013-07-19 MED ORDER — FLEET ENEMA 7-19 GM/118ML RE ENEM: 1.0000 | ENEMA | Freq: Once | RECTAL | Status: AC | PRN

## 2013-07-19 MED ORDER — PANTOPRAZOLE SODIUM 40 MG PO TBEC
40.0000 mg | DELAYED_RELEASE_TABLET | Freq: Every day | ORAL | Status: DC
  Administered 2013-07-20 – 2013-08-15 (×27): 40 mg via ORAL
  Filled 2013-07-19 (×29): qty 1

## 2013-07-19 MED ORDER — ESCITALOPRAM OXALATE 10 MG PO TABS
10.0000 mg | ORAL_TABLET | Freq: Every day | ORAL | Status: DC
  Administered 2013-07-20 – 2013-07-28 (×9): 10 mg via ORAL
  Filled 2013-07-19 (×10): qty 1

## 2013-07-19 MED ORDER — HEPARIN SODIUM (PORCINE) 1000 UNIT/ML DIALYSIS
1000.0000 [IU] | INTRAMUSCULAR | Status: DC | PRN
  Filled 2013-07-19: qty 1

## 2013-07-19 MED ORDER — METOPROLOL TARTRATE 50 MG PO TABS
50.0000 mg | ORAL_TABLET | Freq: Two times a day (BID) | ORAL | Status: DC
  Administered 2013-07-19 – 2013-08-14 (×43): 50 mg via ORAL
  Filled 2013-07-19 (×57): qty 1

## 2013-07-19 MED ORDER — ONDANSETRON HCL 4 MG/2ML IJ SOLN
4.0000 mg | INTRAMUSCULAR | Status: DC | PRN
  Filled 2013-07-19: qty 2

## 2013-07-19 MED ORDER — INSULIN ASPART 100 UNIT/ML ~~LOC~~ SOLN
0.0000 [IU] | Freq: Three times a day (TID) | SUBCUTANEOUS | Status: DC
  Administered 2013-07-19: 5 [IU] via SUBCUTANEOUS
  Administered 2013-07-20 – 2013-07-21 (×4): 3 [IU] via SUBCUTANEOUS
  Administered 2013-07-21: 1 [IU] via SUBCUTANEOUS
  Administered 2013-07-21: 2 [IU] via SUBCUTANEOUS
  Administered 2013-07-22: 1 [IU] via SUBCUTANEOUS
  Administered 2013-07-22: 2 [IU] via SUBCUTANEOUS
  Administered 2013-07-22: 1 [IU] via SUBCUTANEOUS
  Administered 2013-07-23 (×2): 2 [IU] via SUBCUTANEOUS
  Administered 2013-07-23: 1 [IU] via SUBCUTANEOUS
  Administered 2013-07-26: 3 [IU] via SUBCUTANEOUS
  Administered 2013-07-28: 1 [IU] via SUBCUTANEOUS
  Administered 2013-07-29: 2 [IU] via SUBCUTANEOUS
  Administered 2013-07-29 – 2013-07-30 (×2): 1 [IU] via SUBCUTANEOUS
  Administered 2013-07-30: 2 [IU] via SUBCUTANEOUS
  Administered 2013-07-31: 1 [IU] via SUBCUTANEOUS
  Administered 2013-08-02 – 2013-08-04 (×3): 2 [IU] via SUBCUTANEOUS
  Administered 2013-08-04 – 2013-08-05 (×2): 1 [IU] via SUBCUTANEOUS
  Administered 2013-08-05: 3 [IU] via SUBCUTANEOUS
  Administered 2013-08-05: 1 [IU] via SUBCUTANEOUS
  Administered 2013-08-06 – 2013-08-07 (×2): 2 [IU] via SUBCUTANEOUS
  Administered 2013-08-07: 1 [IU] via SUBCUTANEOUS
  Administered 2013-08-08: 2 [IU] via SUBCUTANEOUS
  Administered 2013-08-09: 1 [IU] via SUBCUTANEOUS
  Administered 2013-08-09: 3 [IU] via SUBCUTANEOUS
  Administered 2013-08-10: 2 [IU] via SUBCUTANEOUS
  Administered 2013-08-10: 1 [IU] via SUBCUTANEOUS
  Administered 2013-08-11 – 2013-08-14 (×3): 2 [IU] via SUBCUTANEOUS

## 2013-07-19 MED ORDER — METHIMAZOLE 10 MG PO TABS
10.0000 mg | ORAL_TABLET | Freq: Every day | ORAL | Status: DC
  Administered 2013-07-20 – 2013-08-15 (×27): 10 mg via ORAL
  Filled 2013-07-19 (×29): qty 1

## 2013-07-19 MED ORDER — CALCIUM CARBONATE ANTACID 500 MG PO CHEW
400.0000 mg | CHEWABLE_TABLET | Freq: Three times a day (TID) | ORAL | Status: DC | PRN
  Filled 2013-07-19: qty 2

## 2013-07-19 MED ORDER — METHOCARBAMOL 500 MG PO TABS
500.0000 mg | ORAL_TABLET | Freq: Four times a day (QID) | ORAL | Status: DC | PRN
  Administered 2013-07-20 – 2013-07-24 (×7): 500 mg via ORAL
  Filled 2013-07-19 (×7): qty 1

## 2013-07-19 MED ORDER — DARBEPOETIN ALFA-POLYSORBATE 25 MCG/0.42ML IJ SOLN: 25.0000 ug | INTRAMUSCULAR | Status: DC

## 2013-07-19 MED ORDER — ATORVASTATIN CALCIUM 40 MG PO TABS
40.0000 mg | ORAL_TABLET | Freq: Every day | ORAL | Status: DC
  Administered 2013-07-20 – 2013-08-14 (×23): 40 mg via ORAL
  Filled 2013-07-19 (×27): qty 1

## 2013-07-19 MED ORDER — LIDOCAINE HCL (PF) 1 % IJ SOLN
5.0000 mL | INTRAMUSCULAR | Status: DC | PRN
  Filled 2013-07-19: qty 5

## 2013-07-19 MED ORDER — HYDROCODONE-ACETAMINOPHEN 5-325 MG PO TABS
1.0000 | ORAL_TABLET | ORAL | Status: DC | PRN
  Administered 2013-07-19 – 2013-07-20 (×2): 2 via ORAL
  Filled 2013-07-19 (×2): qty 2

## 2013-07-19 MED ORDER — ALUMINUM HYDROXIDE GEL 320 MG/5ML PO SUSP
15.0000 mL | Freq: Four times a day (QID) | ORAL | Status: DC | PRN
  Filled 2013-07-19: qty 30

## 2013-07-19 MED ORDER — BISACODYL 10 MG RE SUPP
10.0000 mg | Freq: Every day | RECTAL | Status: DC | PRN
  Administered 2013-07-22 (×2): 10 mg via RECTAL
  Filled 2013-07-19 (×2): qty 1

## 2013-07-19 NOTE — Progress Notes (Signed)
Rehab admissions - I met with pt to follow up on yesterday's visit. Pt had spoken with her son about possible rehab. "I will do whatever he thinks is best." Further questions were answered about possible rehab with inpatient rehab.  Manuela Schwartz, PT also gave me update on pt's activity.  I then called and spoke with pt's son, Karma Greaser and answered his questions about possible inpatient rehab. He is interested in pursuing this option with his mom and pt is agreeable as well.  I then left messages with Dr. Melven Sartorius office to see if pt is medically cleared for inpatient rehab. Bed is available and could potentially bring pt to CIR later today with physician clearance.  I updated Malachy Mood, case Freight forwarder and Lorriane Shire with social worker about pt's case. Pt's RN Jonni Sanger is aware as well.  I will proceed when I hear back from Dr. Vertell Limber for medical clearance. Please call me with any questions. Thanks.  Nanetta Batty, PT Rehabilitation Admissions Coordinator [phone]

## 2013-07-19 NOTE — Progress Notes (Signed)
Physical Medicine and Rehabilitation Consult   Reason for Consult: Paraparesis Referring Physician: Dr. Vertell Limber     HPI: Holly Ellison is a 73 y.o. female with history of DM type 2 with neuropathy, CAD, COPD, ESRD, HA,  who was admitted on 07/16/13 pm with one week history of BLE, fecal incontinence and severe back pain. 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 Vertell Limber.    Reports started having weakness a few months ago with limited mobility and falls.      Review of Systems  Constitutional: Positive for malaise/fatigue.  HENT: Negative for hearing loss.   Eyes: Positive for blurred vision. Negative for double vision.  Respiratory: Negative for cough and shortness of breath.   Cardiovascular: Negative for chest pain and palpitations.  Gastrointestinal: Negative for heartburn, nausea and abdominal pain.  Musculoskeletal: Positive for back pain and myalgias.  Neurological: Positive for sensory change (Chronic bilateral hands and legs), focal weakness and weakness. Negative for headaches.     Past Medical History   Diagnosis  Date   .  Chronic kidney disease         just found out? Placed graft in [facility]-Dr. Tarri Glenn in [facility].  started dialysis in 06/2011   .  Arthritis         Osteoarthritis   .  Neuropathy in diabetes     .  Insomnia     .  Asthma     .  CAD (coronary artery disease)  2012       7 stents-First PCI was in [place].  The last time she had a procedure was  a stent last year, thena  CABG was undertaken in October [facility])   .  GERD (gastroesophageal reflux disease)     .  Diabetes mellitus         Type II   .  COPD (chronic obstructive pulmonary disease)     .  Hypertension     .  Peripheral vascular disease         endarterectomy   .  Anemia     .  CHF (congestive heart failure)     .   Coronary artery dilation     .  Myocardial infarction  2013       x 3   .  Colloid cyst of brain         surgery in the 1980's   .  H/O Bell's palsy     .  Frequent headaches     .  Orthostasis         Past Surgical History   Procedure  Laterality  Date   .  Abdominal hysterectomy       .  Coronary artery bypass graft    2012       7 stents   .  Cea           Left   .  Carpal tunnel release       .  Av fistula placement       .  Grave's disease           ? unclea rif ca or not     Family History   Problem  Relation  Age of Onset   .  Diabetes type II          Social History:  Married.  Retired [employer] and moved here a few years ago to live with family. Used walker/cane PTA. Has a supportive family-- Lives with daughter (works nights) and  Son in neighborhood helps with ADLs.  Husband with dementia in [place].  reports that she quit smoking about 17 years ago. Her smoking use included Cigarettes. She has never used smokeless tobacco. She reports that she does not drink alcohol or use illicit drugs.      Allergies   Allergen  Reactions   .  Dilaudid [Hydromorphone Hcl]  Itching   .  Morphine Sulfate  Itching   .  Opium  Itching   .  Ciprocin-Fluocin-Procin [Fluocinolone Acetonide]  Itching    Medications Prior to Admission   Medication  Sig  Dispense  Refill   .  albuterol (PROVENTIL HFA;VENTOLIN HFA) 108 (90 BASE) MCG/ACT inhaler  Inhale 2 puffs into the lungs every 6 (six) hours as needed for wheezing.         Marland Kitchen  amitriptyline (ELAVIL) 25 MG tablet           .  ampicillin (PRINCIPEN) 250 MG capsule           .  aspirin EC 81 MG EC tablet  Take 1 tablet (81 mg total) by mouth daily.         Marland Kitchen  atorvastatin (LIPITOR) 40 MG tablet  Take 40 mg by mouth daily.         .  clopidogrel (PLAVIX) 75 MG tablet  Take 1 tablet (75 mg total) by mouth daily.   30 tablet   0   .  gabapentin (NEURONTIN) 100 MG capsule  Take 100 mg by mouth 3 (three) times daily.         .   insulin aspart (NOVOLOG) 100 unit/mL injection  Inject 2 Units into the skin 2 (two) times daily with a meal. Sliding scale: No units if <120, 3 units if 120-150, 4 if 150-180         .  LANTUS SOLOSTAR 100 UNIT/ML Solostar Pen           .  lidocaine (XYLOCAINE) 5 % ointment           .  methimazole (TAPAZOLE) 10 MG tablet  Take 10 mg by mouth daily.         .  metolazone (ZAROXOLYN) 2.5 MG tablet  Take 2.5 mg by mouth daily as needed.          .  metoprolol (LOPRESSOR) 100 MG tablet  Take 50 mg by mouth 2 (two) times daily.         .  metoprolol succinate (TOPROL-XL) 100 MG 24 hr tablet           .  Oxycodone HCl 10 MG TABS  Take 10 mg by mouth 3 (three) times daily as needed (Pain).         .  pantoprazole (PROTONIX) 40 MG tablet  Take 40 mg by mouth daily.         .  ranolazine (RANEXA) 500 MG 12 hr tablet  Take 500 mg by mouth 2 (two) times daily.         .  sertraline (ZOLOFT) 25 MG tablet           .  silver sulfADIAZINE (SILVADENE) 1 % cream  Apply 1 application topically daily.   50 g   2   .  topiramate (TOPAMAX) 25 MG tablet           .  zolpidem (AMBIEN) 5 MG tablet              Home: Home Living Living Arrangements: Children   Functional History: Functional Status:   Mobility:   ADL:   Cognition: Cognition Orientation Level: Oriented X4   Blood pressure 145/69, pulse 77, temperature 98.9 F (37.2 C), temperature source Oral, resp. rate 16, height 5\' 6"  (1.676 m), weight 71.5 kg (157 lb 10.1 oz), SpO2 100.00%. Physical Exam  Vitals reviewed. Constitutional: She is oriented to person, place, and time. She appears well-developed and well-nourished.  In distress due to back pain.  HENT:   Head: Normocephalic and atraumatic.  Eyes: Conjunctivae are normal. Pupils are equal, round, and reactive to light.  Neck: Normal range of motion. Neck supple.  Cardiovascular: Normal rate and regular rhythm.    No murmur heard. Respiratory: Effort normal and breath sounds  normal. No respiratory distress. She has no wheezes.  GI: Soft. Bowel sounds are normal. She exhibits no distension. There is no tenderness.  Musculoskeletal: She exhibits no edema.  Neurological: She is alert and oriented to person, place, and time.  Speech clear. Follows commands without difficulty. Diffuse weakness--BUE/LLE>RLE. Bilateral hands with thenar atrophy and contracture with decrease in fine motor movements. Numbness stocking-glove distribution. May have additional thoracic sensory level but difficult to assess given pain levels, anxiety this am.  Skin: Skin is warm and dry.  Psychiatric:  Pleasant but anxious     Results for orders placed during the hospital encounter of 07/16/13 (from the past 24 hour(s))   TYPE AND SCREEN     Status: None     Collection Time      07/16/13  9:15 PM       Result  Value  Ref Range     ABO/RH(D)  O POS        Antibody Screen  POS        Sample Expiration  07/19/2013        DAT, IgG  NEG        Antibody Identification  ANTI M        Unit Number  [id number]        Blood Component Type  RBC LR PHER2        Unit division  00        Status of Unit  ALLOCATED        Donor AG Type  NEGATIVE FOR M ANTIGEN        Transfusion Status  OK TO TRANSFUSE        Crossmatch Result  COMPATIBLE        Unit Number  [id number]        Blood Component Type  RED CELLS,LR        Unit division  00        Status of Unit  ALLOCATED        Donor AG Type  NEGATIVE FOR M ANTIGEN        Transfusion Status  OK TO TRANSFUSE        Crossmatch Result  COMPATIBLE        Unit Number  [id number]        Blood Component Type  RED CELLS,LR        Unit division  00        Status of Unit  ALLOCATED        Donor AG Type  NEGATIVE FOR M ANTIGEN        Transfusion Status  OK TO TRANSFUSE        Crossmatch Result  COMPATIBLE        Unit Number  [id number]        Blood Component Type  RED CELLS,LR        Unit division  00        Status of Unit  ALLOCATED         Transfusion Status  OK TO TRANSFUSE        Crossmatch Result  COMPATIBLE        Unit Number  [id number]        Blood Component Type  RED CELLS,LR        Unit division  00        Status of Unit  ALLOCATED        Transfusion Status  OK TO TRANSFUSE        Crossmatch Result  COMPATIBLE      POCT I-STAT 4, (NA,K, GLUC, HGB,HCT)     Status: Abnormal     Collection Time      07/16/13 10:27 PM       Result  Value  Ref Range     Sodium  138   137 - 147 mEq/L     Potassium  3.5 (*)  3.7 - 5.3 mEq/L     Glucose, Bld  142 (*)  70 - 99 mg/dL     HCT  32.0 (*)  36.0 - 46.0 %     Hemoglobin  10.9 (*)  12.0 - 15.0 g/dL   GLUCOSE, CAPILLARY     Status: Abnormal     Collection Time      07/16/13 11:39 PM       Result  Value  Ref Range     Glucose-Capillary  138 (*)  70 - 99 mg/dL   BLOOD PRODUCT ORDER (VERBAL) VERIFICATION     Status: None     Collection Time      07/17/13 12:30 AM       Result  Value  Ref Range     Blood product order confirm  MD AUTHORIZATION REQUESTED      MRSA PCR SCREENING     Status: None     Collection Time      07/17/13  3:16 AM       Result  Value  Ref Range     MRSA by PCR  NEGATIVE   NEGATIVE   GLUCOSE, CAPILLARY     Status: Abnormal     Collection Time      07/17/13  4:24 AM       Result  Value  Ref Range     Glucose-Capillary  178 (*)  70 - 99 mg/dL    Dg Thoracolumbar Spine   07/17/2013   CLINICAL DATA:  Thoracic laminectomy for tumor.  EXAM: THORACOLUMBAR SPINE - 2 VIEW  COMPARISON:  MRI of the thoracic spine July 16, 2013.  FINDINGS: Two frontal prone radiographs of the thoracolumbar junction, appearing interoperative, radiologist was not present.  A grid has been placed over the thoracolumbar spine, needle markers overlie the L3, L1 vertebral bodies and inferior endplate of T9 and E31. Status post median sternotomy.  IMPRESSION: Limited intraoperative AP views of the thoracolumbar spine demonstrate markers indicating T9, and T11 inferior endplate in the L1,  and L3 vertebral bodies.   Electronically Signed   By: Elon Alas   On: 07/17/2013 02:46     Assessment/Plan: Diagnosis: destructive T11 lesion  causing cord compression s/p surgical decompression Does the need for close, 24 hr/day medical supervision in concert with the patient's rehab needs make it unreasonable for this patient to be served in a less intensive setting? Yes Co-Morbidities requiring supervision/potential complications: DM with DPN, copd, esrd, pain Due to bowel management, safety, skin/wound care, disease management, medication administration, pain management and patient education, does the patient require 24 hr/day rehab nursing? Yes Does the patient require coordinated care of a physician, rehab nurse, PT (1-2 hrs/day, 5 days/week) and OT (1-2 hrs/day, 5 days/week) to address physical and functional deficits in the context of the above medical diagnosis(es)? Yes Addressing deficits in the following areas: balance, endurance, locomotion, strength, transferring, bowel/bladder control, bathing, dressing, feeding, grooming, toileting and psychosocial support Can the patient actively participate in an intensive therapy program of at least 3 hrs of therapy per day at least 5 days per week? Yes The potential for patient to make measurable gains while on inpatient rehab is excellent Anticipated functional outcomes upon discharge from inpatient rehab are supervision and min assist  with PT, supervision and min assist with OT, n/a with SLP. Estimated rehab length of stay to reach the above functional goals is: 15-20 days potentially Does the patient have adequate social supports to accommodate these discharge functional goals? Yes and Potentially Anticipated D/C setting: Home Anticipated post D/C treatments: HH therapy Overall Rehab/Functional Prognosis: good   RECOMMENDATIONS: This patient's condition is appropriate for continued rehabilitative care in the following setting:  CIR Patient has agreed to participate in recommended program. Potentially Note that insurance prior authorization may be required for reimbursement for recommended care.   Comment: Pt seemed to indicate that she wanted to go to a SNF closer to home. I discussed the potential benefits of CIR vs SNF. She will d/w daughter. We will follow along.    Meredith Staggers, MD, [facility]         07/17/2013    Revision History...     Date/Time User Action   07/18/2013 8:54 AM Meredith Staggers, MD Sign   07/17/2013 9:04 AM Bary Leriche, PA-C Share  View Details Report   Routing History...     Date/Time From To Method   07/18/2013 8:54 AM Meredith Staggers, MD Meredith Staggers, MD In Basket   07/18/2013 8:54 AM Meredith Staggers, MD Nicholos Johns, MD Fax

## 2013-07-19 NOTE — Progress Notes (Signed)
Physical Therapy Treatment Patient Details Name: Holly Ellison MRN: [id number] DOB: Nov 23, 1940 Today's Date: 07/19/2013    History of Present Illness Patient is 73 year old female with ESRD on dialysis with 1 week h/o progressive weakness in legs and fecal incontinence and severe back pain.  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    PT Comments    Patient motivated to participate with PT and improve mobility.  Agree with need for Inpatient Rehab at discharge for continued therapy.  Follow Up Recommendations  CIR     Equipment Recommendations  Other (comment) (TBD at next venue of care)    Recommendations for Other Services Rehab consult     Precautions / Restrictions Precautions Precautions: Back;Fall Precaution Comments: Reviewed precautions.  Recall 1/3 Restrictions Weight Bearing Restrictions: No    Mobility  Bed Mobility Overal bed mobility: Needs Assistance;+2 for physical assistance Bed Mobility: Supine to Sit;Rolling Rolling: Mod assist   Supine to sit: Mod assist     General bed mobility comments: v/c for safety with back precautions  Transfers Overall transfer level: Needs assistance Equipment used: Rolling walker (2 wheeled) Transfers: Sit to/from Stand Sit to Stand: Mod assist;+2 physical assistance         General transfer comment: Verbal cues for hand placement.  Assist to rise to standing and for balance.  Patient stood x3 with rest between each time.  Knees buckling in stance.   Ambulation/Gait Ambulation/Gait assistance: Min assist;+2 safety/equipment Ambulation Distance (Feet): 5 Feet Assistive device: Rolling walker (2 wheeled) Gait Pattern/deviations: Step-through pattern;Decreased stride length;Decreased step length - right;Decreased step  length - left;Ataxic Gait velocity: decreased Gait velocity interpretation: Below normal speed for age/gender General Gait Details: Patient able to ambulate 5' with chair directly behind patient for safety.  Knees buckling.   Stairs            Wheelchair Mobility    Modified Rankin (Stroke Patients Only)       Balance Overall balance assessment: Needs assistance Sitting-balance support: Bilateral upper extremity supported;Feet supported Sitting balance-Leahy Scale: Fair     Standing balance support: Bilateral upper extremity supported Standing balance-Leahy Scale: Poor                      Cognition Arousal/Alertness: Awake/alert Behavior During Therapy: WFL for tasks assessed/performed Overall Cognitive Status: Impaired/Different from baseline Area of Impairment: Memory     Memory: Decreased short-term memory;Decreased recall of precautions         General Comments: Pt reports extended time on bed pan. Per staff pt has been on bed pan > 40 minutes and it was verified that pt did use call bell appropriately. .    Exercises General Exercises - Lower Extremity Ankle Circles/Pumps: AROM;Both;10 reps;Seated    General Comments General comments (skin integrity, edema, etc.): risk for skin break down due to incontinence      Pertinent Vitals/Pain Pain 8/10 in back with mobility.    Home Living Family/patient expects to be discharged to:: Private residence Living Arrangements: Children Available Help at Discharge: Family Type of Home: House Home Access: Level entry   [place]: One [place]: Environmental consultant - 2 wheels;Shower seat;Wheelchair - manual      Prior Function Level of Independence: Independent with assistive device(s)  PT Goals (current goals can now be found in the care plan section) Acute Rehab PT Goals Patient Stated Goal: to be able to go home Progress towards PT goals: Progressing toward goals    Frequency  Min  5X/week    PT Plan Current plan remains appropriate    Co-evaluation             End of Session Equipment Utilized During Treatment: Gait belt Activity Tolerance: Patient limited by pain;Patient limited by fatigue Patient left: in chair;with call bell/phone within reach     Time: 1006-1030 PT Time Calculation (min): 24 min  Charges:  $Therapeutic Activity: 23-37 mins                    G Codes:      Despina Pole 2013/08/09, 11:47 AM Holly Ellison. Sanjuana Kava, Holly Ellison Pager [phone]

## 2013-07-19 NOTE — Care Management Note (Signed)
CARE MANAGEMENT NOTE 07/19/2013  Patient:  Holly Ellison, Holly Ellison   Account Number:  [id number]  Date Initiated:  07/19/2013  Documentation initiated by:  Calina Patrie  Subjective/Objective Assessment:   Consult to CIR for possible adm.     Action/Plan:   Notified by CIR that pt has been accepted for admission to inpt rehab today.   Anticipated DC Date:  07/19/2013   Anticipated DC Plan:  IP REHAB FACILITY         Choice offered to / List presented to:             Status of service:  Completed, signed off Medicare Important Message given?   (If response is "NO", the following Medicare IM given date fields will be blank) Date Medicare IM given:   Date Additional Medicare IM given:    Discharge Disposition:    Per UR Regulation:    If discussed at Long Length of Stay Meetings, dates discussed:    Comments:

## 2013-07-19 NOTE — Progress Notes (Signed)
Subjective: Patient reports "My son missed the rehab lady yesterday. They're going to talk by phone today I think"  Objective: Vital signs in last 24 hours: Temp:  [97.9 F (36.6 C)-98.7 F (37.1 C)] 98.7 F (37.1 C) (06/12 0739) Pulse Rate:  [65-77] 71 (06/12 0739) Resp:  [10-19] 14 (06/12 0739) BP: (113-194)/(51-88) 194/69 mmHg (06/12 0739) SpO2:  [90 %-95 %] 94 % (06/12 0739) Weight:  [73.8 kg (162 lb 11.2 oz)-76.9 kg (169 lb 8.5 oz)] 76.3 kg (168 lb 3.4 oz) (06/12 0501)  Intake/Output from previous day: 06/11 0701 - 06/12 0700 In: 921.2 [P.O.:848; I.V.:13.2] Out: 3000  Intake/Output this shift:    Awakens to voice. Conversant, noting only incisional pain with movements. Drsg intact, without erythema, swelling, or drainage.   Lab Results:  Recent Labs  07/17/13 1110 07/17/13 1210 07/18/13 0242  WBC 16.9*  --  16.0*  HGB 4.9* 8.2* 7.1*  HCT 14.3* 24.7* 21.0*  PLT 411*  --  289   BMET  Recent Labs  07/17/13 1110 07/18/13 0242  NA 137 132*  K 3.9 4.1  CL 90* 86*  CO2 23 27  GLUCOSE 197* 251*  BUN 25* 41*  CREATININE 3.85* 4.80*  CALCIUM 9.5 9.7    Studies/Results: Ct Chest Wo Contrast  07/17/2013   CLINICAL DATA:  Status post thoracic laminectomy 07/16/2013 for 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 Heavy smoker. Evaluate for mass. Biopsy results unavailable.  EXAM: CT CHEST WITHOUT CONTRAST  TECHNIQUE: Multidetector CT imaging of the chest was performed following the standard protocol without IV contrast.  COMPARISON:  Thoracic MRI 07/16/2013. Abdominal pelvic CT 07/10/2013.  FINDINGS: There is extensive atherosclerosis of the aorta, great vessels and coronary arteries status post median sternotomy and CABG. Small mediastinal lymph nodes are not pathologically enlarged. The heart size is normal. There is no significant pleural or pericardial effusion.  The lungs demonstrate mild emphysema and asymmetric subpleural fibrotic changes within the right lung, possibly due to prior  radiation therapy. There is no evidence of pulmonary mass, endobronchial lesion or confluent airspace opacity. Thyromegaly is noted.  The visualized upper abdomen has a stable appearance without suspicious findings.  There are interval postsurgical changes status post T10-12 laminectomy. Posterior epidural drain is in place. Large (2.9 cm) central lucency within the T11 vertebral body and surrounding sclerosis are unchanged. The lucency extends to the superior endplate. There is no endplate destruction or collapse. Mild endplate changes are present on the left at T10 without endplate destruction. A small amount of anterior paraspinal soft tissue thickening is unchanged. No other osseous lesions are evident.  IMPRESSION: 1. Interval T10-12 laminectomy without demonstrated complication. The spinal canal appears adequately decompressed. 2. Underlying lucency within T11 vertebral body and surrounding sclerosis are unchanged. In addition to the clinically considered indolent infection as the etiology for this process, amyloid deposition should be considered given the patient's history of end-stage renal disease. 3. No evidence of primary malignancy within the chest or extra osseous metastatic disease.   Electronically Signed   By: Camie Patience M.D.   On: 07/17/2013 13:45    Assessment/Plan: stable   LOS: 3 days  Awaiting conversation between pts son & Rehab. Hopeful of CIR.    Verdis Prime 07/19/2013, 8:04 AM

## 2013-07-19 NOTE — Progress Notes (Signed)
Pt prepared for d/c to Inpatient Rehab. IVs left inplace. Skin intact except as most recently charted. Vitals are stable. Report called to [facility] on 21M.  Pt to be transported via bed by NT & RN.  Jillyn Ledger, MBA, BS, RN

## 2013-07-19 NOTE — H&P (Signed)
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  HPI: Holly Ellison is a 73 y.o. female with history of DM type 2 with neuropathy, CAD, COPD, ESRD, HA, who was admitted on 07/16/13 pm with one week history of BLE, fecal incontinence and severe back pain. 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 Vertell Limber. Frozen section appeared benign and NS questions old or indolent osteomyelitis. Patient with history of MSSA bacteremia 07/2012 s/p 4 weeks of antibiotic therapy. CT chest without evidence of malignancy. HD ongoing and labs with evidence of ABLA with down to 7.1 as well as leucocytosis with WBC-16.0. Therapy initiated and CIR recommended by rehab team and MD therefore patient admitted today.   Patient complains of back pain mainly with movement. Was having this problem even before hospitalization. Patient has had hand weakness as well as foot weakness chronically secondary to diabetic peripheral neuropathy however over the last couple months he started developing thigh weakness  Review of Systems  HENT: Negative for hearing loss.  Eyes: Positive for blurred vision. Negative for pain.  Respiratory: Negative for cough and shortness of breath.  Cardiovascular: Positive for chest pain (chronic chest wall pain). Negative for palpitations.  Gastrointestinal: Positive for constipation. Negative for heartburn, nausea and abdominal pain.  Musculoskeletal: Positive for back pain and myalgias.  Neurological: Positive for sensory change (BLE to knees and bilateral hands) and focal weakness (BLE getting stronger since surgery). Negative for headaches.  Psychiatric/Behavioral: The patient has insomnia.   Past Medical History   Diagnosis  Date   .  Chronic kidney disease      just found out? Placed graft in  NOv-Dr. Tarri Glenn in [facility]. started dialysis in 06/2011   .  Arthritis      Osteoarthritis   .  Neuropathy in diabetes    .  Insomnia    .  Asthma    .  CAD (coronary artery disease)  2012     7 stents-First PCI was in [place]. The last time she had a procedure was a stent last year, thena CABG was undertaken in October [facility])   .  GERD (gastroesophageal reflux disease)    .  Diabetes mellitus      Type II   .  COPD (chronic obstructive pulmonary disease)    .  Hypertension    .  Peripheral vascular disease      endarterectomy   .  Anemia    .  CHF (congestive heart failure)    .  Coronary artery dilation    .  Myocardial infarction  2013     x 3   .  Colloid cyst of brain      surgery in the 1980's   .  H/O Bell's palsy    .  Frequent headaches    .  Orthostasis     Past Surgical History   Procedure  Laterality  Date   .  Abdominal hysterectomy     .  Coronary artery bypass graft   2012     7 stents   .  Cea       Left   .  Carpal tunnel release     .  Av fistula placement     .  Grave's disease       ? unclea rif ca or not   .  Laminectomy  N/A  07/16/2013     Procedure: THORACIC LAMINECTOMY FOR TUMOR; Surgeon: Erline Levine, MD; Location: [place] [facility]; Service: Neurosurgery; Laterality: N/A;    Family History   Problem  Relation  Age of Onset   .  Diabetes type II      Social History: Married. Retired [employer] and moved here a few years ago to live with family. Used walker/cane PTA. Has a supportive family-- Lives with daughter (works nights) and Son in neighborhood helps with ADLs. Husband with dementia in [place]. reports that she quit smoking about 17 years ago. Her smoking use included Cigarettes. She has never used smokeless tobacco. She reports that she does not drink alcohol or use illicit drugs.  Allergies   Allergen  Reactions   .  Dilaudid [Hydromorphone Hcl]  Itching   .  Morphine Sulfate  Itching   .  Opium  Itching   .   Ciprofloxacin Hcl  Itching   .  Procainamide Hcl      Unknown reaction    Medications Prior to Admission   Medication  Sig  Dispense  Refill   .  albuterol (PROVENTIL HFA;VENTOLIN HFA) 108 (90 BASE) MCG/ACT inhaler  Inhale 2 puffs into the lungs every 6 (six) hours as needed for wheezing.     Marland Kitchen  amitriptyline (ELAVIL) 25 MG tablet  Take 25-50 mg by mouth at bedtime.     Marland Kitchen  aspirin 325 MG EC tablet  Take 325 mg by mouth daily.     Marland Kitchen  atorvastatin (LIPITOR) 40 MG tablet  Take 40 mg by mouth at bedtime.     Marland Kitchen  b complex-vitamin c-folic acid (NEPHRO-VITE) 0.8 MG TABS tablet  Take 1 tablet by mouth daily.     .  calcium carbonate (TUMS EX) 750 MG chewable tablet  Chew 1 tablet by mouth 3 (three) times daily.     .  clopidogrel (PLAVIX) 75 MG tablet  Take 1 tablet (75 mg total) by mouth daily.  30 tablet  0   .  escitalopram (LEXAPRO) 10 MG tablet  Take 10 mg by mouth daily.     Marland Kitchen  gabapentin (NEURONTIN) 100 MG capsule  Take 200-300 mg by mouth 2 (two) times daily. Take 2 capsules in the morning and 3 capsules at bedtime     .  insulin aspart (NOVOLOG) 100 unit/mL injection  Inject 0-4 Units into the skin 3 (three) times daily before meals. Sliding scale: No units if <120, 3 units if 120-150, 4 if 150-180     .  LANTUS SOLOSTAR 100 UNIT/ML Solostar Pen  Inject 28 Units into the skin every morning.     .  lidocaine (XYLOCAINE) 5 % ointment  daily as needed for mild pain (due to dialysis).     .  methimazole (TAPAZOLE) 10 MG tablet  Take 10 mg by mouth 3 (three) times daily.     .  metoprolol succinate (TOPROL-XL) 100 MG 24 hr tablet  Take 100 mg by mouth daily. Take with or immediately following a meal.     .  [EXPIRED] nitrofurantoin, macrocrystal-monohydrate, (MACROBID) 100 MG capsule  Take 100 mg by mouth 2 (two) times daily.     .  pantoprazole (PROTONIX) 40 MG tablet  Take 40 mg by mouth daily.     .  ranolazine (RANEXA) 1000 MG SR tablet  Take 1,000 mg by mouth 2 (two) times daily.     .   silver sulfADIAZINE (SILVADENE)  1 % cream  Apply 1 application topically daily.  50 g  2    Home:  Home Living  Family/patient expects to be discharged to:: Private residence  Living Arrangements: Children  Available Help at Discharge: Family  Type of Home: House  Home Access: Level entry  [place]: One [place]: Environmental consultant - 2 wheels;Shower seat;Wheelchair - manual  Functional History:  Prior Function  Level of Independence: Independent with assistive device(s)  Functional Status:  Mobility:  Bed Mobility  Overal bed mobility: Needs Assistance;+2 for physical assistance  Bed Mobility: Supine to Sit;Rolling  Rolling: Mod assist  Sidelying to sit: Min assist (with rail)  Supine to sit: Mod assist  General bed mobility comments: v/c for safety with back precautions  Transfers  Overall transfer level: Needs assistance  Equipment used: Rolling walker (2 wheeled)  Transfers: Sit to/from Stand  Sit to Stand: Mod assist;+2 physical assistance  Stand pivot transfers: Min assist  General transfer comment: v/c for hand precautions  Ambulation/Gait  Ambulation/Gait assistance: Min assist  Ambulation Distance (Feet): 24 Feet  Assistive device: Rolling walker (2 wheeled)  Gait Pattern/deviations: Step-through pattern;Decreased stride length;Shuffle;Drifts right/left;Trunk flexed;Narrow base of support  Gait velocity: decreased  Gait velocity interpretation: <1.8 ft/sec, indicative of risk for recurrent falls  General Gait Details: too fatigued and unsafe to ambulate with available +1 assist (need 2 people)   ADL:  ADL  Overall ADL's : Needs assistance/impaired  Eating/Feeding: Set up  Grooming: Wash/dry hands;Wash/dry face;Oral care;Brushing hair;Sitting;Set up  Lower Body Bathing: Total assistance  Lower Body Bathing Details (indicate cue type and reason): supine in bed due to incontinence of bladder  Upper Body Dressing : Minimal assistance;Sitting  Lower Body  Dressing: Total assistance  Toilet Transfer: +2 for physical assistance;Moderate assistance;Ambulation;RW  General ADL Comments: Pt supine on arrival on bed pan requesting to be removed off pan. pt noted to have soiled linens but no void in bed pan. Pt log rolled Rt side with repeat education of precautions  Cognition:  Cognition  Overall Cognitive Status: Impaired/Different from baseline  Orientation Level: Oriented X4  Cognition  Arousal/Alertness: Awake/alert  Behavior During Therapy: WFL for tasks assessed/performed  Overall Cognitive Status: Impaired/Different from baseline  Area of Impairment: Memory  Memory: Decreased short-term memory;Decreased recall of precautions  Following Commands: Follows multi-step commands with increased time  Safety/Judgement: Decreased awareness of safety  Awareness: Emergent  Problem Solving: Slow processing;Decreased initiation;Difficulty sequencing;Requires verbal cues;Requires tactile cues  General Comments: Pt reports extended time on bed pan. Per staff pt has been on bed pan > 40 minutes and it was verified that pt did use call bell appropriately. Marland Kitchen  Physical Exam:  Blood pressure 194/69, pulse 71, temperature 98.7 F (37.1 C), temperature source Oral, resp. rate 14, height 5' 6"  (1.676 m), weight 76.3 kg (168 lb 3.4 oz), SpO2 94.00%.  Physical Exam  Nursing note and vitals reviewed.  Constitutional: She is oriented to person, place, and time. She appears well-developed and well-nourished. She is sleeping. She is easily aroused.  HENT:  Head: Normocephalic and atraumatic.  Eyes: Conjunctivae are normal. Pupils are equal, round, and reactive to light.  Neck: Normal range of motion. Neck supple.  Cardiovascular: Normal rate and regular rhythm.  Respiratory: Effort normal and breath sounds normal. No respiratory distress. She has no wheezes.  GI: Soft. Bowel sounds are normal. She exhibits no distension. There is no tenderness.  Musculoskeletal:  She exhibits no edema and no tenderness.  Neurological: She  is oriented to person, place, and time and easily aroused.  Speech clear. Follows commands without difficulty. Diffuse weakness--BUE/LLE>RLE. Bilateral hands with thenar atrophy and contracture with decrease in fine motor movements. Numbness stocking-glove distribution  Skin: Skin is warm and dry.  Mid thoracic back incision with honeycomb dressing and dry dressing on prior drain site.  4 minus bilateral deltoid, bicep, tricep 3 minus bilateral finger flexors and extensors 2 minus bilateral hip flexors 3 minus bilaterally knee extensors 2 minus bilateral ankle dorsiflexors and plantar flexors  Results for orders placed during the hospital encounter of 07/16/13 (from the past 48 hour(s))   CULTURE, BLOOD (ROUTINE X 2) Status: None    Collection Time    07/17/13 11:00 AM   Result  Value  Ref Range    Specimen Description  BLOOD LEFT HAND     Special Requests  BOTTLES DRAWN AEROBIC ONLY 3CC     Culture Setup Time      Value:  07/17/2013 16:30     Performed at [facility]    Culture      Value:  BLOOD CULTURE RECEIVED NO GROWTH TO DATE CULTURE WILL BE HELD FOR 5 DAYS BEFORE ISSUING A FINAL NEGATIVE REPORT     Performed at [facility]    Report Status  PENDING    CBC WITH DIFFERENTIAL Status: Abnormal    Collection Time    07/17/13 11:10 AM   Result  Value  Ref Range    WBC  16.9 (*)  4.0 - 10.5 K/uL    RBC  1.48 (*)  3.87 - 5.11 MIL/uL    Hemoglobin  4.9 (*)  12.0 - 15.0 g/dL    Comment:  CRITICAL RESULT CALLED TO, READ BACK BY AND VERIFIED WITH:     WASHBURN S RN 07/17/13 1159 COSTELLO B     REPEATED TO VERIFY    HCT  14.3 (*)  36.0 - 46.0 %    MCV  96.6  78.0 - 100.0 fL    MCH  33.1  26.0 - 34.0 pg    MCHC  34.3  30.0 - 36.0 g/dL    RDW  14.8  11.5 - 15.5 %    Platelets  411 (*)  150 - 400 K/uL    Neutrophils Relative %  89 (*)  43 - 77 %    Lymphocytes Relative  9 (*)  12 - 46 %    Monocytes Relative   2 (*)  3 - 12 %    Eosinophils Relative  0  0 - 5 %    Basophils Relative  0  0 - 1 %    Neutro Abs  15.1 (*)  1.7 - 7.7 K/uL    Lymphs Abs  1.5  0.7 - 4.0 K/uL    Monocytes Absolute  0.3  0.1 - 1.0 K/uL    Eosinophils Absolute  0.0  0.0 - 0.7 K/uL    Basophils Absolute  0.0  0.0 - 0.1 K/uL    Smear Review  PLATELETS APPEAR INCREASED    COMPREHENSIVE METABOLIC PANEL Status: Abnormal    Collection Time    07/17/13 11:10 AM   Result  Value  Ref Range    Sodium  137  137 - 147 mEq/L    Potassium  3.9  3.7 - 5.3 mEq/L    Chloride  90 (*)  96 - 112 mEq/L    CO2  23  19 - 32 mEq/L  Glucose, Bld  197 (*)  70 - 99 mg/dL    BUN  25 (*)  6 - 23 mg/dL    Creatinine, Ser  3.85 (*)  0.50 - 1.10 mg/dL    Calcium  9.5  8.4 - 10.5 mg/dL    Total Protein  8.0  6.0 - 8.3 g/dL    Albumin  3.5  3.5 - 5.2 g/dL    AST  21  0 - 37 U/L    ALT  10  0 - 35 U/L    Alkaline Phosphatase  96  39 - 117 U/L    Total Bilirubin  0.3  0.3 - 1.2 mg/dL    GFR calc non Af Amer  11 (*)  >90 mL/min    GFR calc Af Amer  13 (*)  >90 mL/min    Comment:  (NOTE)     The eGFR has been calculated using the CKD EPI equation.     This calculation has not been validated in all clinical situations.     eGFR's persistently <90 mL/min signify possible Chronic Kidney     Disease.   CULTURE, BLOOD (ROUTINE X 2) Status: None    Collection Time    07/17/13 11:10 AM   Result  Value  Ref Range    Specimen Description  BLOOD THUMB LEFT     Special Requests  BOTTLES DRAWN AEROBIC ONLY 6CC     Culture Setup Time      Value:  07/17/2013 16:29     Performed at [facility]    Culture      Value:  BLOOD CULTURE RECEIVED NO GROWTH TO DATE CULTURE WILL BE HELD FOR 5 DAYS BEFORE ISSUING A FINAL NEGATIVE REPORT     Performed at [facility]    Report Status  PENDING    HEMOGLOBIN A1C Status: Abnormal    Collection Time    07/17/13 11:10 AM   Result  Value  Ref Range    Hemoglobin A1C  6.2 (*)  <5.7 %    Comment:   (NOTE)         According to the ADA Clinical Practice Recommendations for 2011, when     HbA1c is used as a screening test:     >=6.5% Diagnostic of Diabetes Mellitus     (if abnormal result is confirmed)     5.7-6.4% Increased risk of developing Diabetes Mellitus     References:Diagnosis and Classification of Diabetes Mellitus,Diabetes     Care,2011,34(Suppl 1):S62-S69 and Standards of Medical Care in     Diabetes - 2011,Diabetes Care,2011,34 (Suppl 1):S11-S61.    Mean Plasma Glucose  131 (*)  <117 mg/dL    Comment:  Performed at [place], CAPILLARY Status: Abnormal    Collection Time    07/17/13 11:37 AM   Result  Value  Ref Range    Glucose-Capillary  201 (*)  70 - 99 mg/dL   HEMOGLOBIN AND HEMATOCRIT, BLOOD Status: Abnormal    Collection Time    07/17/13 12:10 PM   Result  Value  Ref Range    Hemoglobin  8.2 (*)  12.0 - 15.0 g/dL    Comment:  RESULTS VERIFIED VIA RECOLLECT     REPEATED TO VERIFY    HCT  24.7 (*)  36.0 - 46.0 %   GLUCOSE, CAPILLARY Status: Abnormal    Collection Time    07/17/13 4:15 PM   Result  Value  Ref Range  Glucose-Capillary  228 (*)  70 - 99 mg/dL    Comment 1  Notify RN     Comment 2  Documented in Chart    GLUCOSE, CAPILLARY Status: Abnormal    Collection Time    07/17/13 11:40 PM   Result  Value  Ref Range    Glucose-Capillary  257 (*)  70 - 99 mg/dL    Comment 1  Documented in Chart     Comment 2  Notify RN    CBC Status: Abnormal    Collection Time    07/18/13 2:42 AM   Result  Value  Ref Range    WBC  16.0 (*)  4.0 - 10.5 K/uL    RBC  2.19 (*)  3.87 - 5.11 MIL/uL    Hemoglobin  7.1 (*)  12.0 - 15.0 g/dL    HCT  21.0 (*)  36.0 - 46.0 %    MCV  95.9  78.0 - 100.0 fL    MCH  32.4  26.0 - 34.0 pg    MCHC  33.8  30.0 - 36.0 g/dL    RDW  14.7  11.5 - 15.5 %    Platelets  289  150 - 400 K/uL    Comment:  REPEATED TO VERIFY     DELTA CHECK NOTED   RENAL FUNCTION PANEL Status: Abnormal    Collection Time     07/18/13 2:42 AM   Result  Value  Ref Range    Sodium  132 (*)  137 - 147 mEq/L    Potassium  4.1  3.7 - 5.3 mEq/L    Chloride  86 (*)  96 - 112 mEq/L    CO2  27  19 - 32 mEq/L    Glucose, Bld  251 (*)  70 - 99 mg/dL    BUN  41 (*)  6 - 23 mg/dL    Comment:  DELTA CHECK NOTED    Creatinine, Ser  4.80 (*)  0.50 - 1.10 mg/dL    Calcium  9.7  8.4 - 10.5 mg/dL    Phosphorus  5.3 (*)  2.3 - 4.6 mg/dL    Albumin  3.1 (*)  3.5 - 5.2 g/dL    GFR calc non Af Amer  8 (*)  >90 mL/min    GFR calc Af Amer  10 (*)  >90 mL/min    Comment:  (NOTE)     The eGFR has been calculated using the CKD EPI equation.     This calculation has not been validated in all clinical situations.     eGFR's persistently <90 mL/min signify possible Chronic Kidney     Disease.   GLUCOSE, CAPILLARY Status: Abnormal    Collection Time    07/18/13 7:30 AM   Result  Value  Ref Range    Glucose-Capillary  239 (*)  70 - 99 mg/dL   GLUCOSE, CAPILLARY Status: Abnormal    Collection Time    07/18/13 3:40 PM   Result  Value  Ref Range    Glucose-Capillary  152 (*)  70 - 99 mg/dL   GLUCOSE, CAPILLARY Status: Abnormal    Collection Time    07/18/13 5:03 PM   Result  Value  Ref Range    Glucose-Capillary  242 (*)  70 - 99 mg/dL   GLUCOSE, CAPILLARY Status: Abnormal    Collection Time    07/18/13 8:45 PM   Result  Value  Ref Range    Glucose-Capillary  259 (*)  70 - 99 mg/dL   GLUCOSE, CAPILLARY Status: Abnormal    Collection Time    07/19/13 7:37 AM   Result  Value  Ref Range    Glucose-Capillary  231 (*)  70 - 99 mg/dL    Ct Chest Wo Contrast  07/17/2013 CLINICAL DATA: Status post thoracic laminectomy 07/16/2013 for 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 Heavy smoker. Evaluate for mass. Biopsy results unavailable. EXAM: CT CHEST WITHOUT CONTRAST TECHNIQUE: Multidetector CT imaging of the chest was performed following the standard protocol without IV contrast. COMPARISON: Thoracic MRI 07/16/2013. Abdominal pelvic CT 07/10/2013. FINDINGS:  There is extensive atherosclerosis of the aorta, great vessels and coronary arteries status post median sternotomy and CABG. Small mediastinal lymph nodes are not pathologically enlarged. The heart size is normal. There is no significant pleural or pericardial effusion. The lungs demonstrate mild emphysema and asymmetric subpleural fibrotic changes within the right lung, possibly due to prior radiation therapy. There is no evidence of pulmonary mass, endobronchial lesion or confluent airspace opacity. Thyromegaly is noted. The visualized upper abdomen has a stable appearance without suspicious findings. There are interval postsurgical changes status post T10-12 laminectomy. Posterior epidural drain is in place. Large (2.9 cm) central lucency within the T11 vertebral body and surrounding sclerosis are unchanged. The lucency extends to the superior endplate. There is no endplate destruction or collapse. Mild endplate changes are present on the left at T10 without endplate destruction. A small amount of anterior paraspinal soft tissue thickening is unchanged. No other osseous lesions are evident. IMPRESSION: 1. Interval T10-12 laminectomy without demonstrated complication. The spinal canal appears adequately decompressed. 2. Underlying lucency within T11 vertebral body and surrounding sclerosis are unchanged. In addition to the clinically considered indolent infection as the etiology for this process, amyloid deposition should be considered given the patient's history of end-stage renal disease. 3. No evidence of primary malignancy within the chest or extra osseous metastatic disease. Electronically Signed By: Camie Patience M.D. On: 07/17/2013 13:45   Medical Problem List and Plan:  1. 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  2. DVT Prophylaxis/Anticoagulation: Mechanical: Antiembolism stockings, thigh (TED hose) Bilateral lower  extremities  Sequential compression devices, below knee Bilateral lower extremities  3. Pain Management: Will continue vicodin prn as this is effective.  4. H/o depression/Mood: Resume lexapro. LCSW to follow for evaluation and support.  5. Neuropsych: This patient is capable of making decisions on her own behalf.  6. ESRD: Continue HD on TTS past therapy sessions.  7. DM type 2 with peripheral neuropathy: Will monitor BS with ac/hs checks. Continue lantus insulin with SSI for elevated BS. Anticipate that BS may run high while on decadron.  8. CAD: Continue plavix, lipitor, lopressor and ranexa.  9. Leucocytosis: Monitor for signs of infection with close watch on temp curve as well as wound.  10. HTN: Will monitor every 8 hours. Watch for orthostatic changes on post dialysis days.  11. Acute on chronic anemia: On aranesp. Transfusion with dialysis prn.  Post Admission Physician Evaluation:  Functional deficits secondary to Destructive T11 lesion causing cord compression s/p surgical decompression in addition the patient has severe diabetic peripheral neuropathy both upper extremity and lower extremity motor and sensory deficits 1. Patient is admitted to receive collaborative, interdisciplinary care between the physiatrist, rehab nursing staff, and therapy team. 2. Patient's level of medical complexity and substantial therapy needs in context of that medical necessity cannot be provided at a lesser intensity of care such as  a SNF. 3. Patient has experienced substantial functional loss from his/her baseline which was documented above under the "Functional History" and "Functional Status" headings. Judging by the patient's diagnosis, physical exam, and functional history, the patient has potential for functional progress which will result in measurable gains while on inpatient rehab. These gains will be of substantial and practical use upon discharge in facilitating mobility and self-care at the  household level. 4. Physiatrist will provide 24 hour management of medical needs as well as oversight of the therapy plan/treatment and provide guidance as appropriate regarding the interaction of the two. 5. 24 hour rehab nursing will assist with bowel management, safety, skin/wound care, disease management, medication administration, pain management and patient education and help integrate therapy concepts, techniques,education, etc. 6. PT will assess and treat for/with: pre gait, gait training, endurance , safety, equipment, neuromuscular re education. Goals are:  supervision to min assist . 7. OT will assess and treat for/with: ADLs, , Neuromuscular re education, safety, endurance, equipment. Goals are:  supervision to min assist . 8. SLP will assess and treat for/with: NA. Goals are: NA. 9. Case Management and Social Worker will assess and treat for psychological issues and discharge planning. 10. Team conference will be held weekly to assess progress toward goals and to determine barriers to discharge. 11. Patient will receive at least 3 hours of therapy per day at least 5 days per week. 12. ELOS: 15-20d  13. Prognosis: good Charlett Blake M.D. [facility] FAAPM&R (Sports Med, Neuromuscular Med) Diplomate Am Board of Electrodiagnostic Med   07/19/2013

## 2013-07-19 NOTE — Progress Notes (Signed)
PMR Admission Coordinator Pre-Admission Assessment  Patient: Holly Ellison is an 73 y.o., female  MRN: [id number]  DOB: Jun 21, 1940  Height: 5\' 6"  (167.6 cm)  Weight: 76.3 kg (168 lb 3.4 oz)  Insurance Information  HMO: PPO: PCP: IPA: 80/20: OTHER:  PRIMARY: Medicare A & B Policy#: [id number] a Subscriber: self  CM Name: Phone#: Fax#:  Pre-Cert#: verified in Occupational psychologist: retired  Benefits: Phone #: Name:  Eff. Date: A & B: 12-09-99 Deduct: $1260 Out of Pocket Max: none Life Max: unlimited  CIR: 100% SNF: 100% days 1-20; 80% days 21-100 (100 day visit max)  Outpatient: 80% Co-Pay: 20%  Home Health: 100% Co-Pay: none, no visit limits  DME: 80% Co-Pay: 20%  Providers: pt's preference  Son has begun the [facility] Application process and may have further questions about this.  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 female with history of DM type 2 with neuropathy, CAD, COPD, ESRD, HA, who was admitted on 07/16/13 pm with one week history of BLE, fecal incontinence and severe back pain. 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 Vertell Limber.  Reports started having weakness a few months ago with limited mobility and falls.  Past Medical History    Past Medical History    Diagnosis  Date    .  Chronic kidney disease       just found out? Placed graft in [facility]-Dr. Tarri Glenn in [facility]. started dialysis in 06/2011    .  Arthritis       Osteoarthritis    .  Neuropathy in diabetes     .  Insomnia     .  Asthma      .  CAD (coronary artery disease)  2012      7 stents-First PCI was in [place]. The last time she had a procedure was a stent last year, thena CABG was undertaken in October [facility])    .  GERD (gastroesophageal reflux disease)     .  Diabetes mellitus       Type II    .  COPD (chronic obstructive pulmonary disease)     .  Hypertension     .  Peripheral vascular disease       endarterectomy    .  Anemia     .  CHF (congestive heart failure)     .  Coronary artery dilation     .  Myocardial infarction  2013      x 3    .  Colloid cyst of brain       surgery in the 1980's    .  H/O Bell's palsy     .  Frequent headaches     .  Orthostasis      Family History  family history includes Diabetes type II in an other family member.  Prior Rehab/Hospitalizations: had prior SNF stay in [place] after a heart attack, had other follow up rehab after 2 other heart  attacks, outpt PT following carpal tunnel surgery and inpt stay in 1980's for removal of colloid cyst of the brain.  Current Medications  Current facility-administered medications:0.9 % sodium chloride infusion, 250 mL, Intravenous, Continuous, Erline Levine, MD; 0.9 % sodium chloride infusion, , Intravenous, Continuous, Erline Levine, MD; acetaminophen (TYLENOL) suppository 650 mg, 650 mg, Rectal, Q4H PRN, Erline Levine, MD; acetaminophen (TYLENOL) tablet 650 mg, 650 mg, Oral, Q4H PRN, Erline Levine, MD  albuterol (PROVENTIL) (2.5 MG/3ML) 0.083% nebulizer solution 3 mL, 3 mL, Inhalation, Q2H PRN, Rahul P Desai, PA-C; aspirin EC tablet 81 mg, 81 mg, Oral, Daily, Erline Levine, MD, 81 mg at 07/19/13 0956; atorvastatin (LIPITOR) tablet 40 mg, 40 mg, Oral, Daily, Erline Levine, MD, 40 mg at 07/19/13 0956  calcium carbonate (TUMS - dosed in mg elemental calcium) chewable tablet 400 mg of elemental calcium, 2 tablet, Oral, TID WC, Windy Kalata, MD, 400 mg of elemental calcium at 07/19/13 0818; calcium carbonate (TUMS - dosed in mg  elemental calcium) chewable tablet 400 mg of elemental calcium, 400 mg of elemental calcium, Oral, TID PRN, Brand Males, MD, 400 mg of elemental calcium at 07/18/13 0803  clopidogrel (PLAVIX) tablet 75 mg, 75 mg, Oral, Daily, Erline Levine, MD, 75 mg at 07/19/13 0956; darbepoetin (ARANESP) injection 25 mcg, 25 mcg, Intravenous, Q Thu-HD, Windy Kalata, MD, 25 mcg at 07/18/13 1325; [START ON 07/20/2013] dexamethasone (DECADRON) tablet 2 mg, 2 mg, Oral, Q12H, Erline Levine, MD; dexamethasone (DECADRON) tablet 4 mg, 4 mg, Oral, Q12H, Erline Levine, MD, 4 mg at 07/19/13 0957  diazepam (VALIUM) tablet 5 mg, 5 mg, Oral, Q6H PRN, Erline Levine, MD; docusate sodium (COLACE) capsule 100 mg, 100 mg, Oral, BID, Erline Levine, MD, 100 mg at 07/19/13 0956; doxercalciferol (HECTOROL) injection 4 mcg, 4 mcg, Intravenous, Q T,Th,Sa-HD, Windy Kalata, MD, 4 mcg at 07/18/13 1324; gabapentin (NEURONTIN) capsule 100 mg, 100 mg, Oral, TID, Erline Levine, MD, 100 mg at 07/19/13 0958  HYDROcodone-acetaminophen (NORCO/VICODIN) 5-325 MG per tablet 1-2 tablet, 1-2 tablet, Oral, Q4H PRN, Erline Levine, MD, 2 tablet at 07/19/13 0817; insulin aspart (novoLOG) injection 0-9 Units, 0-9 Units, Subcutaneous, TID WC, Raylene Miyamoto, MD, 3 Units at 07/19/13 [id number]; [START ON 07/20/2013] insulin glargine (LANTUS) injection 28 Units, 28 Units, Subcutaneous, Daily, Kara Mead V, MD  labetalol (NORMODYNE,TRANDATE) injection 10 mg, 10 mg, Intravenous, Q2H PRN, Colbert Coyer, MD, 10 mg at 07/18/13 0005; menthol-cetylpyridinium (CEPACOL) lozenge 3 mg, 1 lozenge, Oral, PRN, Erline Levine, MD; methimazole (TAPAZOLE) tablet 10 mg, 10 mg, Oral, Daily, Erline Levine, MD, 10 mg at 07/19/13 0958; metoprolol (LOPRESSOR) tablet 50 mg, 50 mg, Oral, BID, Erline Levine, MD, 50 mg at 07/19/13 1601  multivitamin (RENA-VIT) tablet 1 tablet, 1 tablet, Oral, QHS, Windy Kalata, MD, 1 tablet at 07/18/13 2137; ondansetron [facility]) injection 4 mg, 4  mg, Intravenous, Q4H PRN, Erline Levine, MD, 4 mg at 07/17/13 1945; oxyCODONE (Oxy IR/ROXICODONE) immediate release tablet 10 mg, 10 mg, Oral, TID PRN, Erline Levine, MD, 10 mg at 07/18/13 0803  oxyCODONE-acetaminophen (PERCOCET/ROXICET) 5-325 MG per tablet 1-2 tablet, 1-2 tablet, Oral, Q4H PRN, Erline Levine, MD; pantoprazole (PROTONIX) EC tablet 40 mg, 40 mg, Oral, Daily, Erline Levine, MD, 40 mg at 07/19/13 1010; phenol (CHLORASEPTIC) mouth spray 1 spray, 1 spray, Mouth/Throat, PRN, Erline Levine, MD; ranolazine (RANEXA) 12 hr tablet 500 mg, 500 mg, Oral, BID, Erline Levine, MD, 500 mg at 07/19/13 0959  sodium chloride 0.9 % injection 3 mL, 3 mL, Intravenous, Q12H, Erline Levine,  MD, 3 mL at 07/18/13 2139; sodium chloride 0.9 % injection 3 mL, 3 mL, Intravenous, PRN, Erline Levine, MD, 3 mL at 07/18/13 0926; zolpidem (AMBIEN) tablet 5 mg, 5 mg, Oral, QHS PRN, Erline Levine, MD, 5 mg at 07/18/13 2146  Patients Current Diet: Diabetic and renal diet, carb modified with 1200 mL fluid restriction  Precautions / Restrictions  Precautions  Precautions: Back;Fall  Precaution Comments: Reviewed precautions. Recall 1/3  Restrictions  Weight Bearing Restrictions: No  Prior Activity Level  Limited Community (1-2x/wk): pt mainly stayed at home but got out with [facility] for dialysis. Son ran all the errands. Pt did get out for MD appts.  Home Assistive Devices / Equipment  Home Assistive Devices/Equipment: Shower chair with back;Walker (specify type);Wheelchair  Home Equipment: Environmental consultant - 2 wheels;Shower seat;Wheelchair - manual  Prior Functional Level  Prior Function  Level of Independence: Independent with assistive device(s)  Current Functional Level    Cognition  Overall Cognitive Status: Impaired/Different from baseline  Orientation Level: Oriented X4  Following Commands: Follows multi-step commands with increased time  Safety/Judgement: Decreased awareness of safety  General Comments: Pt reports extended time  on bed pan. Per staff pt has been on bed pan > 40 minutes and it was verified that pt did use call bell appropriately. .    Extremity Assessment  (includes Sensation/Coordination)      ADLs  Overall ADL's : Needs assistance/impaired  Eating/Feeding: Set up  Grooming: Wash/dry hands;Wash/dry face;Oral care;Brushing hair;Sitting;Set up  Lower Body Bathing: Total assistance  Lower Body Bathing Details (indicate cue type and reason): supine in bed due to incontinence of bladder  Upper Body Dressing : Minimal assistance;Sitting  Lower Body Dressing: Total assistance  Toilet Transfer: +2 for physical assistance;Moderate assistance;Ambulation;RW  General ADL Comments: Pt supine on arrival on bed pan requesting to be removed off pan. pt noted to have soiled linens but no void in bed pan. Pt log rolled Rt side with repeat education of precautions    Mobility  Overal bed mobility: Needs Assistance;+2 for physical assistance  Bed Mobility: Supine to Sit;Rolling  Rolling: Mod assist  Sidelying to sit: Min assist (with rail)  Supine to sit: Mod assist  General bed mobility comments: v/c for safety with back precautions    Transfers  Overall transfer level: Needs assistance  Equipment used: Rolling walker (2 wheeled)  Transfers: Sit to/from Stand  Sit to Stand: Mod assist;+2 physical assistance  Stand pivot transfers: Min assist  General transfer comment: Verbal cues for hand placement. Assist to rise to standing and for balance. Patient stood x3 with rest between each time. Knees buckling in stance.    Ambulation / Gait / Stairs / Wheelchair Mobility  Ambulation/Gait  Ambulation/Gait assistance: Min assist;+2 safety/equipment  Ambulation Distance (Feet): 5 Feet  Assistive device: Rolling walker (2 wheeled)  Gait Pattern/deviations: Step-through pattern;Decreased stride length;Decreased step length - right;Decreased step length - left;Ataxic  Gait velocity: decreased  Gait velocity interpretation:  Below normal speed for age/gender  General Gait Details: Patient able to ambulate 5' with chair directly behind patient for safety. Knees buckling.    Posture / Balance     Special needs/care consideration  BiPAP/CPAP no  CPM no  Continuous Drip IV no  Dialysis - yes Days - Tues, Thurs, Sat.  (dialysis has been contacted to arrange for a later afternoon dialysis time to not interfere with therapy schedule)  Life Vest no  Oxygen no (pt had been on O2 previously - 2L - but  was taken off of it, hx of COPD and asthma)  Special Bed no  Trach Size no  Wound Vac (area) no  Skin - previous diabetic foot ulcer on plantar aspect L foot, current non healing circular wound on R mid anterior shin.  Bowel mgmt: last BM on 07-18-13  Bladder mgmt: oliguria  Diabetic mgmt - son checks pt's blood sugars at home and managed with insulin  Note: L UE with AV fistula site  Note: pt's son has been primary caregiver for pt and stated that his sister Holly Ellison is minimally involved in helping their mom due to her work schedule. Pt also shared that dtr has a 64 yo grandson and 22 yo granddtr that keeps her dtr busy as well.  Son shared his concerns over helping his mom and causing her increased pain. He also shared concerns about not knowing "the right way" to help clean his mom up with hygiene issues and son is willing and wanting further education from nsg/therapy staff on how best to assist pt in these tasks.    Previous Home Environment  Living Arrangements: Children (lives with dtr and 51 yo grandson (who works nights) and son lives in same neighborhood)  Available Help at Discharge: Family - son will be primary caregiver  Type of Home: House  Home Layout: One level  Home Access: Level entry  [place]: No  Discharge Living Setting  Plans for Discharge Living Setting: Lives with (comment) (lives with dtr)  Type of Home at Discharge: House  Discharge Home Layout: One level  Discharge Home Access: Level  entry  Does the patient have any problems obtaining your medications?: No  Social/Family/Support Systems  Contact Information: son Holly Ellison will be primary caregiver  Anticipated Caregiver: son Holly Ellison will be primary caregiver (pt lives with dtr but dtr has crazy work schedule with 72 yo )  Anticipated Ambulance person Information: see above  Ability/Limitations of Caregiver: (son works nights and then helps him Mom during the day)  Caregiver Availability: (son will be primary caregiver, dtr is limited in what help she can provide)  Discharge Plan Discussed with Primary Caregiver: (discussed by phone with son on 6-12)  Is Caregiver In Agreement with Plan?: Yes  Does Caregiver/Family have Issues with Lodging/Transportation while Pt is in Rehab?: No (note that son lives 44 minutes away and works nights)  Goals/Additional Needs  Patient/Family Goal for Rehab: supervision and minimal assistance with PT/OT, NA for SLP  Expected length of stay: 15-20 days  Cultural Considerations: none  Dietary Needs: renal diet, carb modified with 1200 mL fluid restriction  Equipment Needs: to be determined  Pt/Family Agrees to Admission and willing to participate: Yes (spoke with son Holly Ellison by phone on 6-12 )  Program Orientation Provided & Reviewed with Pt/Caregiver Including Roles & Responsibilities: Yes  Decrease burden of Care through IP rehab admission: NA  Possible need for SNF placement upon discharge: not anticipated but son/pt asking about possible other options if pt does not progress as quickly as team anticipates. In addition, son had been looking into [facility] in [place] and considering this for his mom. Pt is willing for this as well. Son shared that he has started the [facility] application process. In addition, pt was asking and considering that if she needed SNF at end of rehab stay, that possibly she could be placed in same SNF in [place] that her husband is staying in.  At this  point, both pt and her son are  aware of the plan to get pt to a supervision to minimal assistance level for home with family assistance. Rehab social workers were made aware of this and will assist pt/family in DC planning needs.  Patient Condition: This patient's condition remains as documented in the consult dated 07-18-13, in which the Rehabilitation Physician determined and documented that the patient's condition is appropriate for intensive rehabilitative care in an inpatient rehabilitation facility. Will admit to inpatient rehab today.  Preadmission Screen Completed By: Nanetta Batty, PT 07/19/2013 12:56 PM  ______________________________________________________________________  Discussed status with Dr. Letta Pate on 07-19-13 at 1256 and received telephone approval for admission today.  Admission Coordinator: Nanetta Batty, PT time 1256 pm/Date 07-19-13    Cosigned by: Charlett Blake, MD [07/19/2013 1:23 PM]

## 2013-07-19 NOTE — PMR Pre-admission (Signed)
PMR Admission Coordinator Pre-Admission Assessment  Patient: Holly Ellison is an 73 y.o., female MRN: [id number] DOB: 01/20/1941 Height: 5\' 6"  (167.6 cm) Weight: 76.3 kg (168 lb 3.4 oz)              Insurance Information HMO:     PPO:      PCP:      IPA:      80/20:      OTHER:  PRIMARY: Medicare A & B      Policy#: [id number]      Subscriber: self CM Name:       Phone#:      Fax#:  Pre-Cert#: verified in Visual merchandiser: retired Benefits:  Phone #:      Name:  Eff. Date: A & B: 12-09-99 Deduct: $1260      Out of Pocket Max: none      Life Max: unlimited CIR: 100%      SNF: 100% days 1-20; 80% days 21-100 (100 day visit max) Outpatient: 80%     Co-Pay: 20% Home Health: 100%      Co-Pay: none, no visit limits DME: 80%     Co-Pay: 20% Providers: pt's preference  Son has begun the [facility] Application process and may have further questions about this.  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 female with history of DM type 2 with neuropathy, CAD, COPD, ESRD, HA,  who was admitted on 07/16/13 pm with one week history of BLE, fecal incontinence and severe back pain. 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 Vertell Limber.   Reports started having weakness a few months ago with limited mobility and falls.   Past Medical History  Past Medical History  Diagnosis Date  . Chronic kidney disease     just found out? Placed graft in [facility]v-Dr. Tarri Glenn in [facility].  started dialysis in 06/2011  . Arthritis     Osteoarthritis   . Neuropathy in diabetes   . Insomnia   . Asthma   . CAD (coronary artery disease) 2012    7 stents-First PCI was in [place].  The last time she had a procedure was  a stent last year, thena  CABG was undertaken in October [facility])  . GERD (gastroesophageal reflux disease)   . Diabetes mellitus     Type II  . COPD (chronic obstructive pulmonary disease)   . Hypertension   . Peripheral vascular disease     endarterectomy  . Anemia   . CHF (congestive heart failure)   . Coronary artery dilation   . Myocardial infarction 2013    x 3  . 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 H/O Bell's palsy   . Frequent headaches   . Orthostasis     Family History  family history includes Diabetes type II in an other family member.  Prior Rehab/Hospitalizations: had prior SNF stay in [place] after a heart attack, had other follow up rehab after 2 other heart attacks, outpt PT following carpal tunnel surgery and  inpt stay in 1980's for removal of colloid cyst of the brain.   Current Medications  Current facility-administered medications:0.9 %  sodium chloride infusion, 250 mL, Intravenous, Continuous, Erline Levine, MD;  0.9 %  sodium chloride infusion, , Intravenous, Continuous, Erline Levine, MD;  acetaminophen (TYLENOL) suppository 650 mg, 650 mg, Rectal, Q4H PRN, Erline Levine, MD;  acetaminophen (TYLENOL) tablet 650 mg, 650 mg, Oral, Q4H PRN, Erline Levine, MD albuterol (PROVENTIL) (2.5 MG/3ML) 0.083% nebulizer solution 3 mL, 3 mL, Inhalation, Q2H PRN, Rahul P Desai, PA-C;  aspirin EC tablet 81 mg, 81 mg, Oral, Daily, Erline Levine, MD, 81 mg at 07/19/13 0956;  atorvastatin (LIPITOR) tablet 40 mg, 40 mg, Oral, Daily, Erline Levine, MD, 40 mg at 07/19/13 0956 calcium carbonate (TUMS - dosed in mg elemental calcium) chewable tablet 400 mg of elemental calcium, 2 tablet, Oral, TID WC, Windy Kalata, MD, 400 mg of elemental calcium at 07/19/13 0818;  calcium carbonate (TUMS - dosed  in mg elemental calcium) chewable tablet 400 mg of elemental calcium, 400 mg of elemental calcium, Oral, TID PRN, Brand Males, MD, 400 mg of elemental calcium at 07/18/13 0803 clopidogrel (PLAVIX) tablet 75 mg, 75 mg, Oral, Daily, Erline Levine, MD, 75 mg at 07/19/13 0956;  darbepoetin (ARANESP) injection 25 mcg, 25 mcg, Intravenous, Q Thu-HD, Windy Kalata, MD, 25 mcg at 07/18/13 1325;  [START ON 07/20/2013] dexamethasone (DECADRON) tablet 2 mg, 2 mg, Oral, Q12H, Erline Levine, MD;  dexamethasone (DECADRON) tablet 4 mg, 4 mg, Oral, Q12H, Erline Levine, MD, 4 mg at 07/19/13 0957 diazepam (VALIUM) tablet 5 mg, 5 mg, Oral, Q6H PRN, Erline Levine, MD;  docusate sodium (COLACE) capsule 100 mg, 100 mg, Oral, BID, Erline Levine, MD, 100 mg at 07/19/13 0956;  doxercalciferol (HECTOROL) injection 4 mcg, 4 mcg, Intravenous, Q T,Th,Sa-HD, Windy Kalata, MD, 4 mcg at 07/18/13 1324;  gabapentin (NEURONTIN) capsule 100 mg, 100 mg, Oral, TID, Erline Levine, MD, 100 mg at 07/19/13 0958 HYDROcodone-acetaminophen (NORCO/VICODIN) 5-325 MG per tablet 1-2 tablet, 1-2 tablet, Oral, Q4H PRN, Erline Levine, MD, 2 tablet at 07/19/13 0817;  insulin aspart (novoLOG) injection 0-9 Units, 0-9 Units, Subcutaneous, TID WC, Raylene Miyamoto, MD, 3 Units at 07/19/13 [phone];  [START ON 07/20/2013] insulin glargine (LANTUS) injection 28 Units, 28 Units, Subcutaneous, Daily, Kara Mead V, MD labetalol (NORMODYNE,TRANDATE) injection 10 mg, 10 mg, Intravenous, Q2H PRN, Colbert Coyer, MD, 10 mg at 07/18/13 0005;  menthol-cetylpyridinium (CEPACOL) lozenge 3 mg, 1 lozenge, Oral, PRN, Erline Levine, MD;  methimazole (TAPAZOLE) tablet 10 mg, 10 mg, Oral, Daily, Erline Levine, MD, 10 mg at 07/19/13 0958;  metoprolol (LOPRESSOR) tablet 50 mg, 50 mg, Oral, BID, Erline Levine, MD, 50 mg at 07/19/13 6270 multivitamin (RENA-VIT) tablet 1 tablet, 1 tablet, Oral, QHS, Windy Kalata, MD, 1 tablet at 07/18/13 2137;  ondansetron [facility])  injection 4 mg, 4 mg, Intravenous, Q4H PRN, Erline Levine, MD, 4 mg at 07/17/13 1945;  oxyCODONE (Oxy IR/ROXICODONE) immediate release tablet 10 mg, 10 mg, Oral, TID PRN, Erline Levine, MD, 10 mg at 07/18/13 0803 oxyCODONE-acetaminophen (PERCOCET/ROXICET) 5-325 MG per tablet 1-2 tablet, 1-2 tablet, Oral, Q4H PRN, Erline Levine, MD;  pantoprazole (PROTONIX) EC tablet 40 mg, 40 mg, Oral, Daily, Erline Levine, MD, 40 mg at 07/19/13 1010;  phenol (CHLORASEPTIC) mouth spray 1 spray, 1 spray, Mouth/Throat, PRN, Erline Levine, MD;  ranolazine (RANEXA) 12 hr tablet 500 mg, 500 mg, Oral, BID, Erline Levine, MD, 500 mg at 07/19/13 0959 sodium chloride 0.9 % injection  3 mL, 3 mL, Intravenous, Q12H, Erline Levine, MD, 3 mL at 07/18/13 2139;  sodium chloride 0.9 % injection 3 mL, 3 mL, Intravenous, PRN, Erline Levine, MD, 3 mL at 07/18/13 0926;  zolpidem (AMBIEN) tablet 5 mg, 5 mg, Oral, QHS PRN, Erline Levine, MD, 5 mg at 07/18/13 2146  Patients Current Diet: Diabetic and renal diet, carb modified with 1200 mL fluid restriction  Precautions / Restrictions Precautions Precautions: Back;Fall Precaution Comments: Reviewed precautions.  Recall 1/3 Restrictions Weight Bearing Restrictions: No   Prior Activity Level Limited Community (1-2x/wk): pt mainly stayed at home but got out with Access for dialysis. Son ran all the errands. Pt did get out for MD appts.  Home Assistive Devices / Equipment Home Assistive Devices/Equipment: Shower chair with back;Walker (specify type);Wheelchair Home Equipment: Environmental consultant - 2 wheels;Shower seat;Wheelchair - manual  Prior Functional Level Prior Function Level of Independence: Independent with assistive device(s)  Current Functional Level Cognition  Overall Cognitive Status: Impaired/Different from baseline Orientation Level: Oriented X4 Following Commands: Follows multi-step commands with increased time Safety/Judgement: Decreased awareness of safety General Comments: Pt reports  extended time on bed pan. Per staff pt has been on bed pan > 40 minutes and it was verified that pt did use call bell appropriately. .    Extremity Assessment (includes Sensation/Coordination)          ADLs  Overall ADL's : Needs assistance/impaired Eating/Feeding: Set up Grooming: Wash/dry hands;Wash/dry face;Oral care;Brushing hair;Sitting;Set up Lower Body Bathing: Total assistance Lower Body Bathing Details (indicate cue type and reason): supine in bed due to incontinence of bladder Upper Body Dressing : Minimal assistance;Sitting Lower Body Dressing: Total assistance Toilet Transfer: +2 for physical assistance;Moderate assistance;Ambulation;RW General ADL Comments: Pt supine on arrival on bed pan requesting to be removed off pan. pt noted to have soiled linens but no void in bed pan. Pt log rolled Rt side with repeat education of precautions    Mobility  Overal bed mobility: Needs Assistance;+2 for physical assistance Bed Mobility: Supine to Sit;Rolling Rolling: Mod assist Sidelying to sit: Min assist (with rail) Supine to sit: Mod assist General bed mobility comments: v/c for safety with back precautions    Transfers  Overall transfer level: Needs assistance Equipment used: Rolling walker (2 wheeled) Transfers: Sit to/from Stand Sit to Stand: Mod assist;+2 physical assistance Stand pivot transfers: Min assist General transfer comment: Verbal cues for hand placement.  Assist to rise to standing and for balance.  Patient stood x3 with rest between each time.  Knees buckling in stance.     Ambulation / Gait / Stairs / Wheelchair Mobility  Ambulation/Gait Ambulation/Gait assistance: Min assist;+2 safety/equipment Ambulation Distance (Feet): 5 Feet Assistive device: Rolling walker (2 wheeled) Gait Pattern/deviations: Step-through pattern;Decreased stride length;Decreased step length - right;Decreased step length - left;Ataxic Gait velocity: decreased Gait velocity  interpretation: Below normal speed for age/gender General Gait Details: Patient able to ambulate 5' with chair directly behind patient for safety.  Knees buckling.    Posture / Balance      Special needs/care consideration BiPAP/CPAP no  CPM no  Continuous Drip IV no  Dialysis - yes        Days - Tues, Thurs, Sat.  (dialysis has been contacted to arrange for a later afternoon dialysis time to not interfere with therapy schedule) Life Vest no  Oxygen no (pt had been on O2 previously - 2L - but was taken off of it, hx of COPD and asthma)  Special Bed no  Lurline Idol  Size no  Wound Vac (area) no        Skin - previous diabetic foot ulcer on plantar aspect L foot, current non healing circular wound on R mid anterior shin.                               Bowel mgmt: last BM on 07-18-13 Bladder mgmt: oliguria Diabetic mgmt - son checks pt's blood sugars at home and managed with insulin  Note: L UE with AV fistula site  Note: pt's son has been primary caregiver for pt and stated that his sister Elmo Putt is minimally involved in helping their mom due to her work schedule. Pt also shared that dtr has a 47 yo grandson and 39 yo granddtr that keeps her dtr busy as well.   Son shared his concerns over helping his mom and causing her increased pain. He also shared concerns about not knowing "the right way" to help clean his mom up with hygiene issues and son is willing and wanting further education from nsg/therapy staff on how best to assist pt in these tasks.   Previous Home Environment Living Arrangements: Children (lives with dtr and 69 yo grandson (who works nights) and son lives in same neighborhood) Available Help at Discharge: Family - son will be primary caregiver Type of Home: House Home Layout: One level Home Access: Level entry [place]: No  Discharge Living Setting Plans for Discharge Living Setting: Lives with (comment) (lives with dtr) Type of Home at Discharge: House Discharge  Home Layout: One level Discharge Home Access: Level entry Does the patient have any problems obtaining your medications?: No  Social/Family/Support Systems Contact Information: son Karma Greaser will be primary caregiver Anticipated Caregiver: son Karma Greaser will be primary caregiver (pt lives with dtr but dtr has crazy work schedule with 72 yo ) Anticipated Ambulance person Information: see above Ability/Limitations of Caregiver:  (son works nights and then helps him Mom during the day) Caregiver Availability:  (son will be primary caregiver, dtr is limited in what help she can provide) Discharge Plan Discussed with Primary Caregiver:  (discussed by phone with son on 6-12) Is Caregiver In Agreement with Plan?: Yes Does Caregiver/Family have Issues with Lodging/Transportation while Pt is in Rehab?: No (note that son lives 49 minutes away and works nights)  Goals/Additional Needs Patient/Family Goal for Rehab: supervision and minimal assistance with PT/OT, NA for SLP Expected length of stay: 15-20 days Cultural Considerations: none Dietary Needs: renal diet, carb modified with 1200 mL fluid restriction Equipment Needs: to be determined Pt/Family Agrees to Admission and willing to participate: Yes (spoke with son Dwayne by phone on 6-12 ) Program Orientation Provided & Reviewed with Pt/Caregiver Including Roles  & Responsibilities: Yes   Decrease burden of Care through IP rehab admission: NA  Possible need for SNF placement upon discharge: not anticipated but son/pt asking about possible other options if pt does not progress as quickly as team anticipates. In addition, son had been looking into [facility] in [place] and considering this for his mom. Pt is willing for this as well. Son shared that he has started the [facility] application process. In addition, pt was asking and considering that if she needed SNF at end of rehab stay, that possibly she could be placed in same SNF in  [place] that her husband is staying in.  At this point, both pt and her son are aware of the plan  to get pt to a supervision to minimal assistance level for home with family assistance. Rehab social workers were made aware of this and will assist pt/family in DC planning needs.   Patient Condition: This patient's condition remains as documented in the consult dated 07-18-13, in which the Rehabilitation Physician determined and documented that the patient's condition is appropriate for intensive rehabilitative care in an inpatient rehabilitation facility. Will admit to inpatient rehab today.  Preadmission Screen Completed By:  Nanetta Batty, PT 07/19/2013 12:56 PM ______________________________________________________________________   Discussed status with Dr. Letta Pate on 07-19-13 at 1256 and received telephone approval for admission today.  Admission Coordinator:  Nanetta Batty, PT time 1256 pm/Date 07-19-13

## 2013-07-19 NOTE — Progress Notes (Signed)
Rehab admissions - We did receive medical clearance from Dr. Vertell Limber and bed is available today for inpatient rehab. Will admit pt to CIR later this afternoon.   I called and updated pt's son by phone and he was pleased with the news. Pt is also pleased.   I updated Jonni Sanger, RN and Malachy Mood, case management and Lorriane Shire with social services.  Please call me with any questions. Thanks.  Nanetta Batty, PT Rehabilitation Admissions Coordinator [phone]

## 2013-07-19 NOTE — Evaluation (Signed)
Occupational Therapy Evaluation Patient Details Name: Holly Ellison MRN: [id number] DOB: 11-06-1940 Today's Date: 07/19/2013    History of Present Illness Patient is 73 year old female with ESRD on dialysis with 1 week h/o progressive weakness in legs and fecal incontinence and severe back pain.  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   Clinical Impression   Patient is s/p T10-T11 laminectomy and decompression surgery resulting in functional limitations due to the deficits listed below (see OT problem list).  Patient will benefit from skilled OT acutely to increase independence and safety with ADLS to allow discharge CIR. OT to follow acutely for back precautions with adl retraining.     Follow Up Recommendations  CIR    Equipment Recommendations  Other (comment) (defer to SNF)    Recommendations for Other Services Rehab consult     Precautions / Restrictions Precautions Precautions: Back;Fall Precaution Comments: educated on back precautions      Mobility Bed Mobility Overal bed mobility: Needs Assistance;+2 for physical assistance Bed Mobility: Supine to Sit;Rolling Rolling: Mod assist   Supine to sit: Mod assist     General bed mobility comments: v/c for safety with back precautions  Transfers Overall transfer level: Needs assistance Equipment used: Rolling walker (2 wheeled) Transfers: Sit to/from Stand Sit to Stand: Mod assist;+2 physical assistance         General transfer comment: v/c for hand precautions    Balance Overall balance assessment: Needs assistance Sitting-balance support: Bilateral upper extremity supported;Feet supported Sitting balance-Leahy Scale: Fair     Standing balance support: Bilateral upper extremity supported;During functional  activity Standing balance-Leahy Scale: Poor                              ADL Overall ADL's : Needs assistance/impaired Eating/Feeding: Set up   Grooming: Wash/dry hands;Wash/dry face;Oral care;Brushing hair;Sitting;Set up       Lower Body Bathing: Total assistance Lower Body Bathing Details (indicate cue type and reason): supine in bed due to incontinence of bladder Upper Body Dressing : Minimal assistance;Sitting   Lower Body Dressing: Total assistance   Toilet Transfer: +2 for physical assistance;Moderate assistance;Ambulation;RW             General ADL Comments: Pt supine on arrival on bed pan requesting to be removed off pan. pt noted to have soiled linens but no void in bed pan. Pt log rolled Rt side with repeat education of precautions     Vision                     Perception Perception Perception Tested?: No   Praxis      Pertinent Vitals/Pain Premedicated and tolerated activity well     Hand Dominance Right   Extremity/Trunk Assessment Upper Extremity Assessment Upper Extremity Assessment: Overall WFL for tasks assessed   Lower Extremity Assessment Lower Extremity Assessment: Defer to PT evaluation   Cervical / Trunk Assessment Cervical / Trunk Assessment: Normal   Communication Communication Communication: HOH   Cognition Arousal/Alertness: Awake/alert Behavior During Therapy: WFL for tasks assessed/performed Overall Cognitive Status: Impaired/Different from baseline Area of Impairment: Memory     Memory: Decreased short-term memory;Decreased recall of precautions         General Comments: Pt  reports extended time on bed pan. Per staff pt has been on bed pan > 40 minutes and it was verified that pt did use call bell appropriately. .   General Comments       Exercises       Shoulder Instructions      Home Living Family/patient expects to be discharged to:: Private residence Living Arrangements:  Children Available Help at Discharge: Family Type of Home: House Home Access: Level entry     Home Layout: One level               Home Equipment: Environmental consultant - 2 wheels;Shower seat;Wheelchair - manual          Prior Functioning/Environment Level of Independence: Independent with assistive device(s)             OT Diagnosis: Generalized weakness;Cognitive deficits;Acute pain   OT Problem List: Decreased strength;Decreased activity tolerance;Impaired balance (sitting and/or standing);Decreased cognition;Decreased safety awareness;Decreased knowledge of use of DME or AE;Decreased knowledge of precautions;Pain   OT Treatment/Interventions: Self-care/ADL training;Therapeutic exercise;DME and/or AE instruction;Therapeutic activities;Patient/family education;Balance training    OT Goals(Current goals can be found in the care plan section) Acute Rehab OT Goals Patient Stated Goal: to be able to go home OT Goal Formulation: With patient Time For Goal Achievement: 08/02/13 Potential to Achieve Goals: Good  OT Frequency: Min 2X/week   Barriers to D/C:            Co-evaluation              End of Session Equipment Utilized During Treatment: Gait belt;Rolling walker Nurse Communication: Mobility status;Precautions  Activity Tolerance: Patient tolerated treatment well Patient left: with call bell/phone within reach;in chair   Time: 5379-4327 OT Time Calculation (min): 21 min Charges:  OT General Charges $OT Visit: 1 Procedure OT Evaluation $Initial OT Evaluation Tier I: 1 Procedure OT Treatments $Self Care/Home Management : 8-22 mins G-Codes:    Parke Poisson B 2013-08-06, 10:24 AM Pager: [phone]

## 2013-07-19 NOTE — Progress Notes (Signed)
Patient is continuing to improve.  Plan is to transfer the patient to Rehab today.

## 2013-07-19 NOTE — Progress Notes (Signed)
Subjective:   Feeling well. Pain is getting better.  Objective Filed Vitals:   07/18/13 2049 07/19/13 0104 07/19/13 0501 07/19/13 0739  BP: 133/76 176/76 173/69 194/69  Pulse: 69 73 72 71  Temp: 98.3 F (36.8 C) 98.5 F (36.9 C) 98.1 F (36.7 C) 98.7 F (37.1 C)  TempSrc: Oral Oral Oral Oral  Resp: 16 16 18 14   Height:      Weight:   76.3 kg (168 lb 3.4 oz)   SpO2: 95% 94% 90% 94%   Physical Exam General:Alert and oriented. No acute distress.  Heart: RRR Lungs: CTA. Unlabored.  Abdomen: soft, nontender +BS Extremities: no edema. Dialysis Access: L AVF +bruit/thrill  HD orders-  [facility], 4hrs, BFR 400, 2K,2.25Ca DW 73kg, Aranesp 44mcg, hect 4 mcg   Assessment/Plan: 1. 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 Cefazolin per pharm. Consulted for IP rehab 2. ESRD -TTS. HD pending tomorrow. K+4.1 3. Anemia - hgb 7.1 6/11- transfuse sat if drops. Cont aranesp 25 q thurs 4. Secondary hyperparathyroidism - Ca+9.7. Phos 5.3 Cont hectorol 5. HTN/volume - BP elevated. Metop BID. PRN labetalol 6. Nutrition - alb 3. Multivit. Renal diet 7. DM- per primary  Shelle Iron, NP [place] [phone] 07/19/2013,9:37 AM  LOS: 3 days    Additional Objective Labs: Basic Metabolic Panel:  Recent Labs Lab 07/16/13 2227 07/17/13 1110 07/18/13 0242  NA 138 137 132*  K 3.5* 3.9 4.1  CL  --  90* 86*  CO2  --  23 27  GLUCOSE 142* 197* 251*  BUN  --  25* 41*  CREATININE  --  3.85* 4.80*  CALCIUM  --  9.5 9.7  PHOS  --   --  5.3*   Liver Function Tests:  Recent Labs Lab 07/17/13 1110 07/18/13 0242  AST 21  --   ALT 10  --   ALKPHOS 96  --   BILITOT 0.3  --   PROT 8.0  --   ALBUMIN 3.5 3.1*   No results found for this basename: LIPASE, AMYLASE,  in the last 168 hours CBC:  Recent Labs Lab 07/17/13 1110 07/17/13 1210 07/18/13 0242  WBC 16.9*  --  16.0*  NEUTROABS 15.1*  --   --   HGB 4.9* 8.2* 7.1*  HCT 14.3* 24.7* 21.0*  MCV  96.6  --  95.9  PLT 411*  --  289   Blood Culture    Component Value Date/Time   SDES BLOOD THUMB LEFT 07/17/2013 1110   SPECREQUEST BOTTLES DRAWN AEROBIC ONLY 6CC 07/17/2013 1110   CULT  Value:        BLOOD CULTURE RECEIVED NO GROWTH TO DATE CULTURE WILL BE HELD FOR 5 DAYS BEFORE ISSUING A FINAL NEGATIVE REPORT Performed at [facility] 07/17/2013 1110   REPTSTATUS PENDING 07/17/2013 1110    Cardiac Enzymes: No results found for this basename: CKTOTAL, CKMB, CKMBINDEX, TROPONINI,  in the last 168 hours CBG:  Recent Labs Lab 07/18/13 0730 07/18/13 1540 07/18/13 1703 07/18/13 2045 07/19/13 0737  GLUCAP 239* 152* 242* 259* 231*   Iron Studies: No results found for this basename: IRON, TIBC, TRANSFERRIN, FERRITIN,  in the last 72 hours @lablastinr3 @ Studies/Results: Ct Chest Wo Contrast  07/17/2013   CLINICAL DATA:  Status post thoracic laminectomy 07/16/2013 for 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 Heavy smoker. Evaluate for mass. Biopsy results unavailable.  EXAM: CT CHEST WITHOUT CONTRAST  TECHNIQUE: Multidetector CT imaging of the chest was performed following the standard protocol without IV contrast.  COMPARISON:  Thoracic MRI 07/16/2013. Abdominal pelvic CT 07/10/2013.  FINDINGS: There is extensive atherosclerosis of the aorta, great vessels and coronary arteries status post median sternotomy and CABG. Small mediastinal lymph nodes are not pathologically enlarged. The heart size is normal. There is no significant pleural or pericardial effusion.  The lungs demonstrate mild emphysema and asymmetric subpleural fibrotic changes within the right lung, possibly due to prior radiation therapy. There is no evidence of pulmonary mass, endobronchial lesion or confluent airspace opacity. Thyromegaly is noted.  The visualized upper abdomen has a stable appearance without suspicious findings.  There are interval postsurgical changes status post T10-12 laminectomy. Posterior epidural drain is in place. Large  (2.9 cm) central lucency within the T11 vertebral body and surrounding sclerosis are unchanged. The lucency extends to the superior endplate. There is no endplate destruction or collapse. Mild endplate changes are present on the left at T10 without endplate destruction. A small amount of anterior paraspinal soft tissue thickening is unchanged. No other osseous lesions are evident.  IMPRESSION: 1. Interval T10-12 laminectomy without demonstrated complication. The spinal canal appears adequately decompressed. 2. Underlying lucency within T11 vertebral body and surrounding sclerosis are unchanged. In addition to the clinically considered indolent infection as the etiology for this process, amyloid deposition should be considered given the patient's history of end-stage renal disease. 3. No evidence of primary malignancy within the chest or extra osseous metastatic disease.   Electronically Signed   By: Camie Patience M.D.   On: 07/17/2013 13:45   Medications: . sodium chloride    . sodium chloride Stopped (07/18/13 0719)   . aspirin EC  81 mg Oral Daily  . atorvastatin  40 mg Oral Daily  . calcium carbonate  2 tablet Oral TID WC  .  ceFAZolin (ANCEF) IV  2 g Intravenous Q T,Th,Sa-HD  . clopidogrel  75 mg Oral Daily  . darbepoetin (ARANESP) injection - DIALYSIS  25 mcg Intravenous Q Thu-HD  . [START ON 07/20/2013] dexamethasone  2 mg Oral Q12H  . dexamethasone  4 mg Oral Q12H  . docusate sodium  100 mg Oral BID  . doxercalciferol  4 mcg Intravenous Q T,Th,Sa-HD  . gabapentin  100 mg Oral TID  . insulin aspart  0-9 Units Subcutaneous TID WC  . insulin glargine  20 Units Subcutaneous Daily  . methimazole  10 mg Oral Daily  . metoprolol  50 mg Oral BID  . multivitamin  1 tablet Oral QHS  . pantoprazole  40 mg Oral Daily  . ranolazine  500 mg Oral BID  . sodium chloride  3 mL Intravenous Q12H   I have seen and examined this patient and agree with plan per Shelle Iron.   CO pain Lt lower back/flank  that is very tender to just superficial palpation.  No rash or lesions.  ? Neuropathic pain.  Plan HD in am and recheck Hg. Aeris Hersman T,MD 07/19/2013 10:12 AM

## 2013-07-19 NOTE — Progress Notes (Addendum)
PULMONARY / CRITICAL CARE MEDICINE   Name: Holly Ellison MRN: [id number] DOB: June 01, 1940    ADMISSION DATE:  07/16/2013 CONSULTATION DATE:  07/17/13  REFERRING MD :  EDP PRIMARY SERVICE: PCCM  CHIEF COMPLAINT:  Medical management post T11 laminectomy  BRIEF PATIENT DESCRIPTION: 73 y.o. 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  PCCM was consulted for assistance with medical management. PMH - ESRD on HD, CAD, COPD, DM-2 H/o MSSA bacteremia in 07/2012 ,neg TTE S/p 4 wks of abx therapy   SIGNIFICANT EVENTS / STUDIES:  6/9 - to OR for thoracic laminectomy for tumor with decompression of thoracic spinal cord.  LINES / TUBES: Foley 6/9 >>> Thoracic hemovac 6/9 >>>  CULTURES: None  ANTIBIOTICS:    SUBJECTIVE: c/o back pain No dyspnea, chest pain  VITAL SIGNS: Temp:  [97.9 F (36.6 C)-98.7 F (37.1 C)] 98.7 F (37.1 C) (06/12 0739) Pulse Rate:  [65-76] 71 (06/12 0739) Resp:  [10-19] 14 (06/12 0739) BP: (119-194)/(61-88) 194/69 mmHg (06/12 0739) SpO2:  [90 %-95 %] 94 % (06/12 0739) Weight:  [73.8 kg (162 lb 11.2 oz)-76.9 kg (169 lb 8.5 oz)] 76.3 kg (168 lb 3.4 oz) (06/12 0501) HEMODYNAMICS:   VENTILATOR SETTINGS:   INTAKE / OUTPUT: Intake/Output     06/11 0701 - 06/12 0700 06/12 0701 - 06/13 0700   P.O. 848    I.V. (mL/kg) 13.2 (0.2)    Other 60    IV Piggyback     Total Intake(mL/kg) 921.2 (12.1)    Urine (mL/kg/hr)     Other 3000 (1.6)    Total Output 3000     Net -2078.8          Urine Occurrence 1 x    Stool Occurrence 1 x      PHYSICAL EXAMINATION: General: Pleasant female, oob to chair, in NAD. Neuro: A&O x 3, BLes 4/5, UE 5/5 HEENT: [place]/AT. PERRL, sclerae anicteric. Cardiovascular: RRR, no M/R/G.  Lungs: Respirations even and unlabored.  CTA bilaterally, No W/R/R.  Abdomen: BS x 4, soft, NT/ND.  Musculoskeletal: No gross deformities, no edema.  Skin: Intact, warm, no  rashes.    LABS:  CBC  Recent Labs Lab 07/17/13 1110 07/17/13 1210 07/18/13 0242  WBC 16.9*  --  16.0*  HGB 4.9* 8.2* 7.1*  HCT 14.3* 24.7* 21.0*  PLT 411*  --  289   Coag's No results found for this basename: APTT, INR,  in the last 168 hours BMET  Recent Labs Lab 07/16/13 2227 07/17/13 1110 07/18/13 0242  NA 138 137 132*  K 3.5* 3.9 4.1  CL  --  90* 86*  CO2  --  23 27  BUN  --  25* 41*  CREATININE  --  3.85* 4.80*  GLUCOSE 142* 197* 251*   Electrolytes  Recent Labs Lab 07/17/13 1110 07/18/13 0242  CALCIUM 9.5 9.7  PHOS  --  5.3*   Sepsis Markers No results found for this basename: LATICACIDVEN, PROCALCITON, O2SATVEN,  in the last 168 hours ABG No results found for this basename: PHART, PCO2ART, PO2ART,  in the last 168 hours Liver Enzymes  Recent Labs Lab 07/17/13 1110 07/18/13 0242  AST 21  --   ALT 10  --   ALKPHOS 96  --   BILITOT 0.3  --   ALBUMIN 3.5 3.1*   Cardiac Enzymes No results found for this basename: TROPONINI, PROBNP,  in the last 168 hours Glucose  Recent Labs  Lab 07/17/13 2340 07/18/13 0730 07/18/13 1540 07/18/13 1703 07/18/13 2045 07/19/13 0737  GLUCAP 257* 239* 152* 242* 259* 231*    Imaging Ct Chest Wo Contrast  07/17/2013   CLINICAL DATA:  Status post thoracic laminectomy 07/16/2013 for 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 Heavy smoker. Evaluate for mass. Biopsy results unavailable.  EXAM: CT CHEST WITHOUT CONTRAST  TECHNIQUE: Multidetector CT imaging of the chest was performed following the standard protocol without IV contrast.  COMPARISON:  Thoracic MRI 07/16/2013. Abdominal pelvic CT 07/10/2013.  FINDINGS: There is extensive atherosclerosis of the aorta, great vessels and coronary arteries status post median sternotomy and CABG. Small mediastinal lymph nodes are not pathologically enlarged. The heart size is normal. There is no significant pleural or pericardial effusion.  The lungs demonstrate mild emphysema and asymmetric  subpleural fibrotic changes within the right lung, possibly due to prior radiation therapy. There is no evidence of pulmonary mass, endobronchial lesion or confluent airspace opacity. Thyromegaly is noted.  The visualized upper abdomen has a stable appearance without suspicious findings.  There are interval postsurgical changes status post T10-12 laminectomy. Posterior epidural drain is in place. Large (2.9 cm) central lucency within the T11 vertebral body and surrounding sclerosis are unchanged. The lucency extends to the superior endplate. There is no endplate destruction or collapse. Mild endplate changes are present on the left at T10 without endplate destruction. A small amount of anterior paraspinal soft tissue thickening is unchanged. No other osseous lesions are evident.  IMPRESSION: 1. Interval T10-12 laminectomy without demonstrated complication. The spinal canal appears adequately decompressed. 2. Underlying lucency within T11 vertebral body and surrounding sclerosis are unchanged. In addition to the clinically considered indolent infection as the etiology for this process, amyloid deposition should be considered given the patient's history of end-stage renal disease. 3. No evidence of primary malignancy within the chest or extra osseous metastatic disease.   Electronically Signed   By: Camie Patience M.D.   On: 07/17/2013 13:45     ASSESSMENT / PLAN:  PULMONARY A: Hx COPD, heavy ex smoker CT chest neg P:   - Albuterol PRN. - Pulmonary toiletry.   CARDIOVASCULAR A:  Hx HTN Hx CAD Hx HLD P:  - Resumed home meds.  RENAL A:   ESRD - normally dialyses T/Th/Sat. P:   - Renal informed - HD today - NS KVO.   GASTROINTESTINAL A:   Nutrition P:   - Renal diet. - Pantoprazole.  HEMATOLOGIC A: Anemia -acute blood loss + chronic disease related P:  - VTE Proph:  SCD's. - Transfuse for Hb <7 on HD  INFECTIOUS A:   No acute issues. H/o MSSA bacteremia in 07/2012 ,neg TTE  S/p  4 wks of abx therapy P:   - chk blood cx, Can dc ancef since cx neg  ENDOCRINE A:   DM  Hyperthyroidism P:   - CBG's ac/hs - SSI, increase lantus full home dose . - ct  methimazole.  NEUROLOGIC A:   S/p T11 laminectomy / decompression of spinal cord. Acute encephalopathy after anesthesia P:   - Per neurosurgery -prelim neg for malignancy, doubt infectious -PT consult, CIR planned   Advise age appropriate screening for maliognancy as outpt - mamogram, colonoscopy Will sign off I have personally obtained a history, examined the patient, evaluated laboratory and imaging results, formulated the assessment and plan and placed orders.   Kara Mead MD. [facility] Flood. [facility] Pager [phone] If no response call [phone]      07/19/2013,  10:16 AM

## 2013-07-19 NOTE — Progress Notes (Addendum)
Received pt. As a transfer from [facility] [address].Pt. From home with family,no assistance device at bedside.Pt. Oriented to unit,unable to show safety video.Safety plan was sing with pt.keep monitoring pt. closely and assessing her needs.Pt. Has a diabetic ulcer on bottom of her left foot open to air,also on her right lower leg pt. Has another diabetic ulcer open to air,both are dry and intact.

## 2013-07-20 ENCOUNTER — Inpatient Hospital Stay (HOSPITAL_COMMUNITY): Payer: Medicare Other

## 2013-07-20 ENCOUNTER — Inpatient Hospital Stay (HOSPITAL_COMMUNITY): Payer: Medicare Other | Admitting: *Deleted

## 2013-07-20 LAB — TYPE AND SCREEN
ABO/RH(D): O POS
ANTIBODY SCREEN: POSITIVE
DAT, IgG: NEGATIVE
DONOR AG TYPE: NEGATIVE
DONOR AG TYPE: NEGATIVE
Donor AG Type: NEGATIVE
UNIT DIVISION: 0
Unit division: 0
Unit division: 0
Unit division: 0
Unit division: 0

## 2013-07-20 LAB — RENAL FUNCTION PANEL
ALBUMIN: 2.8 g/dL — AB (ref 3.5–5.2)
BUN: 57 mg/dL — AB (ref 6–23)
CHLORIDE: 87 meq/L — AB (ref 96–112)
CO2: 25 mEq/L (ref 19–32)
Calcium: 9.7 mg/dL (ref 8.4–10.5)
Creatinine, Ser: 5.14 mg/dL — ABNORMAL HIGH (ref 0.50–1.10)
GFR calc Af Amer: 9 mL/min — ABNORMAL LOW (ref >90)
GFR calc non Af Amer: 8 mL/min — ABNORMAL LOW (ref >90)
GLUCOSE: 310 mg/dL — AB (ref 70–99)
Phosphorus: 2.8 mg/dL (ref 2.3–4.6)
Potassium: 4 mEq/L (ref 3.7–5.3)
Sodium: 129 mEq/L — ABNORMAL LOW (ref 137–147)

## 2013-07-20 LAB — CBC
HEMATOCRIT: 21.4 % — AB (ref 36.0–46.0)
HEMOGLOBIN: 7.1 g/dL — AB (ref 12.0–15.0)
MCH: 32.3 pg (ref 26.0–34.0)
MCHC: 33.2 g/dL (ref 30.0–36.0)
MCV: 97.3 fL (ref 78.0–100.0)
Platelets: 349 10*3/uL (ref 150–400)
RBC: 2.2 MIL/uL — ABNORMAL LOW (ref 3.87–5.11)
RDW: 14.8 % (ref 11.5–15.5)
WBC: 13.9 10*3/uL — AB (ref 4.0–10.5)

## 2013-07-20 LAB — GLUCOSE, CAPILLARY
Glucose-Capillary: 242 mg/dL — ABNORMAL HIGH (ref 70–99)
Glucose-Capillary: 211 mg/dL — ABNORMAL HIGH (ref 70–99)
Glucose-Capillary: 234 mg/dL — ABNORMAL HIGH (ref 70–99)

## 2013-07-20 MED ORDER — DOXERCALCIFEROL 4 MCG/2ML IV SOLN
INTRAVENOUS | Status: AC
  Filled 2013-07-20: qty 2

## 2013-07-20 MED ORDER — HYDROCODONE-ACETAMINOPHEN 7.5-325 MG PO TABS
2.0000 | ORAL_TABLET | Freq: Four times a day (QID) | ORAL | Status: DC | PRN
  Administered 2013-07-21 – 2013-07-24 (×4): 2 via ORAL
  Filled 2013-07-20 (×5): qty 2

## 2013-07-20 MED ORDER — POLYETHYLENE GLYCOL 3350 17 G PO PACK
17.0000 g | PACK | Freq: Every day | ORAL | Status: DC | PRN
  Administered 2013-07-20: 17 g via ORAL
  Filled 2013-07-20: qty 1

## 2013-07-20 MED ORDER — HYDROCODONE-ACETAMINOPHEN 7.5-325 MG PO TABS: 1.0000 | ORAL_TABLET | Freq: Four times a day (QID) | ORAL | Status: DC | PRN

## 2013-07-20 MED ORDER — CLONIDINE HCL 0.1 MG PO TABS
0.1000 mg | ORAL_TABLET | ORAL | Status: DC | PRN
  Administered 2013-07-22: 0.1 mg via ORAL
  Filled 2013-07-20 (×2): qty 1

## 2013-07-20 MED ORDER — AMLODIPINE BESYLATE 2.5 MG PO TABS
2.5000 mg | ORAL_TABLET | Freq: Every day | ORAL | Status: DC
  Administered 2013-07-21 – 2013-07-22 (×2): 2.5 mg via ORAL
  Filled 2013-07-20 (×5): qty 1

## 2013-07-20 NOTE — Evaluation (Signed)
Physical Therapy Assessment and Plan  Patient Details  Name: Holly Ellison MRN: [id number] Date of Birth: 02-27-1940  PT Diagnosis: , Difficulty walking, Impaired sensation, Low back pain and Muscle weakness Rehab Potential: Good ELOS: 18-21 Days   Today's Date: 07/20/2013 Time: 1000-1115 Time Calculation (min): 75 min  Problem List:  Patient Active Problem List   Diagnosis Date Noted  . Compression of spinal cord with myelopathy 07/19/2013  . Paraparesis 07/17/2013  . Bacteremia due to Staphylococcus aureus 07/28/2012  . HCAP (healthcare-associated pneumonia) 07/27/2012  . ESRD on dialysis 07/27/2012  . Chest pain 10/29/2011  . Hypertensive emergency 10/29/2011  . Bell's palsy 10/29/2011  . Hyperthyroidism 10/29/2011  . Pulmonary edema 06/05/2011  . 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 Peripheral neuropathy 06/05/2011  . Diabetes mellitus 06/05/2011  . Hypertension 06/05/2011  . COPD (chronic obstructive pulmonary disease) 06/05/2011  . Atherosclerosis of native arteries of the extremities with ulceration 05/25/2011    Past Medical History:  Past Medical History  Diagnosis Date  . Chronic kidney disease     just found out? Placed graft in [facility]-Dr. Tarri Glenn in [facility].  started dialysis in 06/2011  . Arthritis     Osteoarthritis  . Neuropathy in diabetes   . Insomnia   . Asthma   . CAD (coronary artery disease) 2012    7 stents-First PCI was in [place].  The last time she had a procedure was  a stent last year, thena  CABG was undertaken in [facility])  . GERD (gastroesophageal reflux disease)   . Diabetes mellitus     Type II  . COPD (chronic obstructive pulmonary disease)   . Hypertension   . Peripheral vascular disease     endarterectomy  . Anemia   . CHF (congestive heart failure)   . Coronary artery dilation   . Myocardial infarction 2013    x 3  . 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 H/O Bell's  palsy   . Frequent headaches   . Orthostasis    Past Surgical History:  Past Surgical History  Procedure Laterality Date  . Abdominal hysterectomy    . Coronary artery bypass graft  2012    7 stents  . Cea      Left  . Carpal tunnel release    . Av fistula placement    . Grave's disease      ? unclea rif ca or not   . Laminectomy N/A 07/16/2013    Procedure: THORACIC LAMINECTOMY FOR TUMOR;  Surgeon: Erline Levine, MD;  Location: [place] [facility];  Service: Neurosurgery;  Laterality: N/A;    Assessment & Plan Clinical Impression:   Holly Ellison, "Holly Ellison"  is a 73 y.o. female with history of DM type 2 with neuropathy, CAD, COPD, ESRD, HA, who was admitted on 07/16/13 pm with one week history of BLE, fecal incontinence and severe back pain. 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 Vertell Limber. Frozen section appeared benign and NS questions old or indolent osteomyelitis. Patient with history of MSSA bacteremia 07/2012 s/p 4 weeks of antibiotic therapy. CT chest without evidence of malignancy. HD ongoing and labs with evidence of ABLA with down to 7.1 as well as leucocytosis with WBC-16.0. .   Patient transferred to CIR on 07/19/2013 .   Patient currently  requires max with mobility secondary to muscle weakness and unbalanced muscle activation, ataxia and decreased coordination.  Prior to hospitalization, patient was mod with mobility and lived with Daughter in a House home. Pt's son lives in the neighborhood and assists daily.  Home access is  Level entry.  Patient will benefit from skilled PT intervention to maximize safe functional mobility and decrease caregiver burden for planned discharge home with 24 hour assist.  Anticipate patient will benefit from follow up [facility] at discharge.  PT - End of Session Activity Tolerance: Tolerates 10 - 20 min  activity with multiple rests Endurance Deficit: Yes Endurance Deficit Description: Pt fatigues quickly requiring frequent rest breaks and mild SOB noted, oxygen sats WFL at 92-94% on RA.  PT Assessment Rehab Potential: Good PT Patient demonstrates impairments in the following area(s): Balance;Endurance;Motor;Pain;Safety;Sensory PT Transfers Functional Problem(s): Bed Mobility;Bed to Chair;Car;Furniture PT Locomotion Functional Problem(s): Ambulation;Wheelchair Mobility PT Plan PT Intensity: Minimum of 1-2 x/day ,45 to 90 minutes PT Frequency: 5 out of 7 days PT Duration Estimated Length of Stay: 18-21 Days PT Treatment/Interventions: Ambulation/gait training;Community reintegration;Discharge planning;Disease management/prevention;DME/adaptive equipment instruction;Functional mobility training;Neuromuscular re-education;Pain management;Patient/family education;Psychosocial support;Skin care/wound management;Splinting/orthotics;Therapeutic Activities;Therapeutic Exercise;UE/LE Strength taining/ROM;UE/LE Coordination activities;Visual/perceptual remediation/compensation;Wheelchair propulsion/positioning PT Transfers Anticipated Outcome(s): Supervision PT Locomotion Anticipated Outcome(s): Minimal Assistance PT Recommendation Follow Up Recommendations: Home health PT;24 hour supervision/assistance Patient destination: Home  Skilled Therapeutic Intervention  PT evaluation completed. Pt presents with decreased strength and sensation in BLE. Pt with history of neuropathy in BLE and BUE. Pt able to tolerate standing in parallel bars with mod to max A x2 for 20-30 seconds, demonstrating intermittent B knee buckling upon lateral weight shifting. Pt completed stand pivot tsf x2 with mod A without presence of knee buckling and cues for technique and weight shifting. Pt reports her son was assisting her with transferring in the home from bed to her wheel chair, requiring increased assist. Pt propelled wheel  chair in hallway with noted fatigue. Pt assisted to recliner chair upon return to room. Pt with intermittent pain throughout session with certain movements (lateral, fwd weight shifts).   PT Evaluation Precautions/Restrictions Precautions Precautions: Back;Fall Precaution Comments: Fistula L arm General Chart Reviewed: Yes Family/Caregiver Present: No   Pain Pain Assessment Pain Assessment: 0-10 Pain Score: 7  Pain Type: Surgical pain Pain Location: Thoracic Pain Orientation: Mid Pain Descriptors / Indicators: Aching;Sore Pain Onset: With Activity Patients Stated Pain Goal: 2 Pain Intervention(s): RN made aware;Emotional support;Repositioned Home Living/Prior Functioning Home Living Available Help at Discharge: Family (Daugther works Night shift and son lives close by, assist pt during the day as needed. ) Type of Home: House Home Access: Level entry Home Layout: One level Additional Comments: Pt was primarily at the wheelchiar level prior to admission.   Lives With: Daughter Prior Function Level of Independence: Needs assistance with ADLs;Needs assistance with homemaking;Needs assistance with tranfers  Able to Take Stairs?: No Driving: No Vocation: Retired Comments: Pt's son was assisting her with bathing, dressing and transfers to/from wheelchair. Pt able to propel wheel chair short distances in the home and community, but family also assisting. Pt traveled to HD with transportation services. Pt's husband lives in a SNF in [place].  Vision/Perception  Vision - Assessment Eye Alignment: Impaired (comment) Additional Comments: Pt reports blurred vision and diplopia, but these symptoms have not chaged since her surgery. Pt unable to read due to visual impairements. Pt can see shapes and shadows  Cognition Overall Cognitive Status: Impaired/Different from baseline Orientation Level: Oriented X4 Sensation  Sensation Light Touch: Impaired by gross assessment Additional  Comments: Pt has peripheral neuropathy in BLE and BUE. Decreased to light touch in B feet beginning at the ankle and distal to the toes. Absent to light touch in the R great toe.  Coordination Gross Motor Movements are Fluid and Coordinated: No Fine Motor Movements are Fluid and Coordinated: No Motor  Motor Motor: Abnormal postural alignment and control;Motor impersistence;Ataxia Motor - Skilled Clinical Observations: BLE  Mobility Transfers Transfers: Yes Sit to Stand: 3: Mod assist Sit to Stand Details: Tactile cues for initiation;Tactile cues for sequencing;Tactile cues for weight shifting;Tactile cues for posture;Visual cues/gestures for sequencing;Verbal cues for sequencing;Verbal cues for technique;Verbal cues for precautions/safety Sit to Stand Details (indicate cue type and reason): Pt reports increased pain with sit to stand transfers. Pt demonstrates difficulty with maintaining back precautions. Stand to Sit: 2: Max assist Stand to Sit Details (indicate cue type and reason): Tactile cues for sequencing;Tactile cues for weight shifting;Tactile cues for posture;Tactile cues for placement;Verbal cues for technique;Verbal cues for precautions/safety;Manual facilitation for weight bearing Stand Pivot Transfers: 3: Mod assist Stand Pivot Transfer Details: Tactile cues for initiation;Tactile cues for sequencing;Tactile cues for weight shifting;Tactile cues for posture;Tactile cues for placement;Manual facilitation for weight bearing;Verbal cues for sequencing;Verbal cues for technique;Verbal cues for precautions/safety Stand Pivot Transfer Details (indicate cue type and reason): x2 stand pivot to/from mat to wheel chair. Pt fearful of BLE knee buckling. Pt able to complete transfers without knee buckling. x1 transfers from w/c to recliner chair upon return to room.  Locomotion  Ambulation Ambulation: No (unable to assess. Pt stood in parallel bars, but unable to ambulate secondary to positive  knee buckling in standing upon weight shifting) Wheelchair Mobility Distance: 55  Trunk/Postural Assessment  Cervical Assessment Cervical Assessment: Within Functional Limits Thoracic Assessment Thoracic Assessment: Within Functional Limits Postural Control Postural Control: Deficits on evaluation Trunk Control: Decreased overall trunk control in sitting and with transfers secondary to weakness and fatigue.  Postural Limitations: decreased upright posture in sitting and standing, poor core control/weakness. Pt able to achieve upright posture in standing, but knees buckle without verbal/tactile cues.   Balance Balance Balance Assessed: Yes Static Sitting Balance Static Sitting - Balance Support: Feet supported;No upper extremity supported Static Sitting - Level of Assistance: 5: Stand by assistance Static Standing Balance Static Standing - Level of Assistance: 2: Max assist Static Standing - Comment/# of Minutes: Pt tolerated standing in parallel bars with max A x2 trials of 20 seconds.  Dynamic Standing Balance Dynamic Standing - Balance Support:  (unable to assess) Extremity Assessment      RLE Assessment RLE Assessment: Exceptions to [facility] RLE Strength RLE Overall Strength: Within Functional Limits for tasks assessed Right Hip Flexion: 3+/5 Right Hip Extension: 3+/5 Right Hip ABduction: 3+/5 Right Hip ADduction: 3+/5 Right Knee Flexion: 4/5 Right Knee Extension: 4/5 Right Ankle Dorsiflexion: 3+/5 Right Ankle Plantar Flexion: 3+/5 LLE Assessment LLE Assessment: Exceptions to WFL LLE AROM (degrees) LLE Overall AROM Comments: AROM WFL except DF/PF requried PROM.  LLE Strength LLE Overall Strength: Deficits Left Hip Flexion: 3-/5 Left Hip Extension: 3-/5 Left Hip ABduction: 3-/5 Left Hip ADduction: 3-/5 Left Knee Flexion: 3-/5 Left Knee Extension: 3-/5 Left Ankle Dorsiflexion: 1/5 Left Ankle Plantar Flexion: 1/5  FIM:  FIM - Bed/Chair Transfer Bed/Chair Transfer  Assistive Devices: Arm rests Bed/Chair Transfer: 2: Chair or W/C > Bed: Max A (lift and lower assist);2: Bed > Chair or W/C: Max A (lift and lower assist) FIM - Locomotion:  Wheelchair Distance: 55 Locomotion: Wheelchair: 2: Travels 50 - 149 ft with minimal assistance (Pt.>75%) FIM - Locomotion: Ambulation Locomotion: Ambulation: 0: Activity did not occur FIM - Locomotion: Stairs Locomotion: Stairs: 0: Activity did not occur   Refer to Care Plan for Long Term Goals  Recommendations for other services: None  Discharge Criteria: Patient will be discharged from PT if patient refuses treatment 3 consecutive times without medical reason, if treatment goals not met, if there is a change in medical status, if patient makes no progress towards goals or if patient is discharged from hospital.  The above assessment, treatment plan, treatment alternatives and goals were discussed and mutually agreed upon: by patient  Lillia Corporal R 07/20/2013, 1:39 PM

## 2013-07-20 NOTE — Plan of Care (Signed)
Problem: RH PAIN MANAGEMENT Goal: RH STG PAIN MANAGED AT OR BELOW PT'S PAIN GOAL Outcome: Progressing Less than 3. Goal: RH OTHER STG PAIN MANAGEMENT GOALS W/ASSIST Other STG Pain Management Goals With Assistance.  Outcome: Progressing Less than 3

## 2013-07-20 NOTE — Evaluation (Addendum)
Occupational Therapy Assessment and Plan  Patient Details  Name: Holly Ellison MRN: [id number] Date of Birth: Jan 11, 1941  OT Diagnosis: abnormal posture, acute pain, disturbance of vision and muscle weakness (generalized) Rehab Potential:  good ELOS:   18-21 days  Today's Date: 07/20/2013 Time: 0800-0900  (95min)   1st session Time Calculation (min): 60 min  Problem List:  Patient Active Problem List   Diagnosis Date Noted  . Compression of spinal cord with myelopathy 07/19/2013  . Paraparesis 07/17/2013  . Bacteremia due to Staphylococcus aureus 07/28/2012  . HCAP (healthcare-associated pneumonia) 07/27/2012  . ESRD on dialysis 07/27/2012  . Chest pain 10/29/2011  . Hypertensive emergency 10/29/2011  . Bell's palsy 10/29/2011  . Hyperthyroidism 10/29/2011  . Pulmonary edema 06/05/2011  . 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 Peripheral neuropathy 06/05/2011  . Diabetes mellitus 06/05/2011  . Hypertension 06/05/2011  . COPD (chronic obstructive pulmonary disease) 06/05/2011  . Atherosclerosis of native arteries of the extremities with ulceration 05/25/2011    Past Medical History:  Past Medical History  Diagnosis Date  . Chronic kidney disease     just found out? Placed graft in [facility]-Dr. Tarri Glenn in [facility].  started dialysis in 06/2011  . Arthritis     Osteoarthritis  . Neuropathy in diabetes   . Insomnia   . Asthma   . CAD (coronary artery disease) 2012    7 stents-First PCI was in [place].  The last time she had a procedure was  a stent last year, thena  CABG was undertaken in October [facility])  . GERD (gastroesophageal reflux disease)   . Diabetes mellitus     Type II  . COPD (chronic obstructive pulmonary disease)   . Hypertension   . Peripheral vascular disease     endarterectomy  . Anemia   . CHF (congestive heart failure)   . Coronary artery dilation   . Myocardial infarction 2013    x 3  . Colloid cyst of brain      surgery in the 1980's  . H/O Bell's palsy   . Frequent headaches   . Orthostasis    Past Surgical History:  Past Surgical History  Procedure Laterality Date  . Abdominal hysterectomy    . Coronary artery bypass graft  2012    7 stents  . Cea      Left  . Carpal tunnel release    . Av fistula placement    . Grave's disease      ? unclea rif ca or not   . Laminectomy N/A 07/16/2013    Procedure: THORACIC LAMINECTOMY FOR TUMOR;  Surgeon: Erline Levine, MD;  Location: [place] [facility];  Service: Neurosurgery;  Laterality: N/A;    Assessment & Plan Clinical Impression:due to cord compression.  HPI: Holly Ellison is a 73 y.o. female with history of DM type 2 with neuropathy, CAD, COPD, ESRD, HA, who was admitted on 07/16/13 pm with one week history of BLE, fecal incontinence and severe back pain. 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 Vertell Limber. Frozen section appeared benign and NS questions old or indolent osteomyelitis. Patient with history of MSSA bacteremia 07/2012 s/p 4 weeks of antibiotic therapy. CT chest without evidence of malignancy. HD ongoing and labs with evidence of ABLA with down to 7.1 as well as leucocytosis with WBC-16.0. Therapy initiated and CIR recommended  by rehab team and MD therefore patient admitted today.  Patient complains of back pain mainly with movement. Was having this problem even before hospitalization.  Patient has had hand weakness as well as foot weakness chronically secondary to diabetic peripheral neuropathy however over the last couple months he started developing thigh weakness  .  Patient transferred to CIR on 07/19/2013 .    Patient currently requires max with basic self-care skills secondary to muscle weakness and decreased cardiorespiratoy endurance.  Prior to hospitalization, patient could complete  BADL  with min.  Patient will benefit from skilled intervention to increase independence with basic self-care skills prior to discharge home with care partner.  Anticipate patient will require intermittent supervision and follow up home health.  OT - End of Session Activity Tolerance: Tolerates < 10 min activity with changes in vital signs Endurance Deficit: Yes Endurance Deficit Description: SOB and needs frequent rest breaks OT Assessment Rehab Potential: Good Barriers to Discharge: Other (comment) OT Patient demonstrates impairments in the following area(s): Balance;Cognition;Endurance;Motor;Pain;Safety;Sensory;Skin Integrity;Vision OT Basic ADL's Functional Problem(s): Eating;Grooming;Bathing;Dressing;Toileting OT Transfers Functional Problem(s): Toilet;Tub/Shower OT Additional Impairment(s): Fuctional Use of Upper Extremity OT Plan OT Intensity: Minimum of 1-2 x/day, 45 to 90 minutes OT Frequency: 5 out of 7 days OT Duration/Estimated Length of Stay:  18-21  days OT Treatment/Interventions: Balance/vestibular training;Cognitive remediation/compensation;Discharge planning;DME/adaptive equipment instruction;Functional mobility training;Neuromuscular re-education;Pain management;Patient/family education;Self Care/advanced ADL retraining;Therapeutic Activities;Therapeutic Exercise;UE/LE Strength taining/ROM;UE/LE Coordination activities;Wheelchair propulsion/positioning OT Self Feeding Anticipated Outcome(s): mod I OT Basic Self-Care Anticipated Outcome(s): minimal assist OT Toileting Anticipated Outcome(s): minimal assist OT Bathroom Transfers Anticipated Outcome(s): minimal assit OT Recommendation Patient destination: Home Follow Up Recommendations: Home health OT Equipment Recommended: dropped arm 3 in 1 bedside comode;Tub/shower bench   Skilled Therapeutic Intervention Time:1500-1530  (30 min)  2nd session Pain:  none Individual session   Discusssed POC.and goals for rehab stay.   Pt. Stated she used the wc 2-3 weeks PTA but before that did primarily furniture walk.  She sat on the side of the tub to get in, crossed her legs over and then sat on the shower seat.  Discussed practicing this transfer in future to see if tub bench would be safer.  Pt. In agreement with POC and ELOS.    OT Evaluation Precautions/Restrictions  Precautions Precautions: Back;Fall   Therapy Vitals Temp: 97.6 F (36.4 C) Temp src: Oral Pulse Rate: 74 Resp: 20 BP: 173/64 mmHg Patient Position (if appropriate): Sitting Oxygen Therapy SpO2: 96 % O2 Device: None (Room air) Pain:  7/10 back   Home Living/Prior Functioning Home Living Family/patient expects to be discharged to:: Private residence Living Arrangements: Children Available Help at Discharge: Family Type of Home: House Home Access: Level entry Home Layout: One level  Lives With: Daughter- works from 1300- 2200; grand daughter there during day to assist.  Son lives close by.   IADL History Homemaking Responsibilities: No Current License: No Leisure and Hobbies: crochet but cant' use hands anymore ADL:  See FIM   Vision/Perception  Vision- Assessment Eye Alignment: Impaired (comment)  Cognition Overall Cognitive Status: Impaired/Different from baseline Orientation Level: Oriented to person;Oriented to place;Oriented to situation;Disoriented to time Safety/Judgment: Impaired Sensation Sensation Light Touch: Impaired by gross assessment Additional Comments: Peripheral neuropathy Coordination Gross Motor Movements are Fluid and Coordinated: No Fine Motor Movements are Fluid and Coordinated: No Coordination and Movement Description:  (decreased fine motor coordination; decreased tip to tip) Motor  Motor Motor: Motor impersistence;Abnormal postural alignment and control Motor - Skilled Clinical Observations: decreased  grasp and tip to tip prehension with BUE Mobility  Bed Mobility Bed Mobility: Rolling  Right;Supine to Sit;Rolling Left Rolling Right: 4: Min assist Rolling Right Details: Manual facilitation for weight shifting Rolling Left: 4: Min assist Rolling Left Details: Manual facilitation for weight shifting Supine to Sit: 3: Mod assist Supine to Sit Details: Manual facilitation for weight shifting;Visual cues/gestures for precautions/safety Transfers Sit to Stand: 2: Max assist Sit to Stand Details: Manual facilitation for placement;Verbal cues for precautions/safety;Verbal cues for technique;Verbal cues for sequencing;Manual facilitation for weight bearing Stand to Sit: 2: Max assist;With upper extremity assist;With armrests;To chair/3-in-1 Stand to Sit Details (indicate cue type and reason): Manual facilitation for weight shifting;Verbal cues for precautions/safety;Verbal cues for technique  Trunk/Postural Assessment  Cervical Assessment Cervical Assessment: Within Functional Limits Thoracic Assessment Thoracic Assessment: Within Functional Limits Lumbar Assessment Lumbar Assessment: Exceptions to [employer] [facility] [employer] Lumbar AROM Overall Lumbar AROM Comments: decreased mobility with a/p movements Postural Control Postural Control: Deficits on evaluation Trunk Control: decreased eccentric/concentric control Postural Limitations: decreased upright posture with knees flexed/trunk flexed  Balance Balance Balance Assessed: Yes Static Sitting Balance Static Sitting - Balance Support: Feet supported;No upper extremity supported Static Sitting - Level of Assistance: 6: Modified independent (Device/Increase time) Static Standing Balance Static Standing - Level of Assistance: 2: Max assist Dynamic Standing Balance Dynamic Standing - Balance Support:  (unable with +1) Extremity/Trunk Assessment RUE Assessment RUE Assessment: Exceptions to [facility] RUE Strength RUE Overall Strength: Deficits Gross Grasp: Impaired 3 Point Pinch:  (poor prehension) LUE Assessment LUE Assessment: Exceptions to  [facility] LUE Strength LUE Overall Strength: Deficits Gross Grasp: Impaired 3 Point Pinch:  (impaired)  FIM:  FIM - Grooming Grooming Steps: Wash, rinse, dry face;Wash, rinse, dry hands;Oral care, brush teeth, clean dentures;Brush, comb hair Grooming: 4: Patient completes 3 of 4 or 4 of 5 steps FIM - Bathing Bathing Steps Patient Completed: Chest;Right Arm;Left Arm;Abdomen;Right upper leg;Left upper leg Bathing: 3: Mod-Patient completes 5-7 [id number] parts or 50-74% FIM - Upper Body Dressing/Undressing Upper body dressing/undressing steps patient completed: Thread/unthread right sleeve of pullover shirt/dresss;Thread/unthread left sleeve of pullover shirt/dress;Put head through opening of pull over shirt/dress Upper body dressing/undressing: 4: Min-Patient completed 75 plus % of tasks FIM - Lower Body Dressing/Undressing Lower body dressing/undressing: 1: Total-Patient completed less than 25% of tasks FIM - Toileting Toileting: 0: Activity did not occur FIM - BControl and instrumentation engineerDevices: Arm rests Bed/Chair Transfer: 2: Chair or W/C > Bed: Max A (lift and lower assist);2: Bed > Chair or W/C: Max A (lift and lower assist) FIM - TAir cabin crewTransfers: 0-Activity did not occur (Pt. used bedpan due to [employer] FIM - Tub/Shower Transfers Tub/shower Transfers: 0-Activity did not occur or was simulated   Refer to Care Plan for Long Term Goals  Recommendations for other services: None  Discharge Criteria: Patient will be discharged from OT if patient refuses treatment 3 consecutive times without medical reason, if treatment goals not met, if there is a change in medical status, if patient makes no progress towards goals or if patient is discharged from hospital.  The above assessment, treatment plan, treatment alternatives and goals were discussed and mutually agreed upon: by patient  ELisa Roca6/13/2015, 6:12 PM

## 2013-07-20 NOTE — Progress Notes (Signed)
Nutrition Brief Note  Patient identified on the Malnutrition Screening Tool (MST) Report  Wt Readings from Last 15 Encounters:  07/19/13 168 lb 3.4 oz (76.3 kg)  07/19/13 168 lb 3.4 oz (76.3 kg)  07/19/13 168 lb 3.4 oz (76.3 kg)  10/09/12 148 lb 2.4 oz (67.2 kg)  07/31/12 160 lb 15 oz (73 kg)  11/01/11 155 lb 3.3 oz (70.4 kg)  06/11/11 164 lb 7.4 oz (74.6 kg)  05/25/11 176 lb (79.833 kg)    Body mass index is 27.03 kg/(m^2). Patient meets criteria for Overweight based on current BMI. Pt reports that she had a decreased appetite for a short while but, her appetite has returned to normal and she is eating well again. She states that she usually weighs 167 to 170 lbs. Pt reports following a renal diet PTA and has no additional questions.   Current diet order is Renal/Carb Modified, patient is consuming approximately 60-100% of meals at this time. Labs and medications reviewed.   No nutrition interventions warranted at this time. If nutrition issues arise, please consult RD.   Pryor Ochoa RD, LDN Inpatient Clinical Dietitian Pager: [phone] After Hours Pager: [phone]

## 2013-07-20 NOTE — Progress Notes (Signed)
Physical Therapy Session Note  Patient Details  Name: Holly Ellison MRN: [id number] Date of Birth: 1940-12-10  Today's Date: 07/20/2013 Time: 1300-1330 Time Calculation (min): 30 min  Short Term Goals: Week 1:  PT Short Term Goal 1 (Week 1): Pt will be able to complete supine to sit/sit to supine transfers with mod A to promote OOB activities PT Short Term Goal 2 (Week 1): Pt will be able to complete sit to stand/stand to sit transfers with min A.  PT Short Term Goal 3 (Week 1): Pt will be able to tolerate static standing for 60-75 seconds to promote increased weight bearing and pre-gait activities without knee buckling.  PT Short Term Goal 4 (Week 1): Pt will be able to ambulate 10-15 feet using the LRAD with mod A to promote increased functional mobility within the home.  PT Short Term Goal 5 (Week 1): Pt will be able to propel wheel chair 50 feet with SBA to promote functional mobility within the home and community.  Skilled Therapeutic Interventions/Progress Updates:  Pt received sitting in recliner chair and agreeable to therapy. Pt very sleepy throughout session, requiring consistent cueing to stay alert and attend to task. Pt completed seated there ex to promote increased strength, ROM and overall functional mobility. Exercises included, heel raises, toe raises, marching, LAQ, hip abd, hamstring curls and hip add (pillow squeezes). Pt completed 2x12 with manual resistance provided for hip abd and hamstring curls. Pt repositioned for comfort at completion of session. All needs within reach.   Therapy Documentation Precautions:  Precautions Precautions: Back;Fall Precaution Comments: Fistula L arm    Pain: Pain Assessment Pain Assessment: 0-10 Pain Score: 4  Pain Type: Surgical pain Pain Location: Thoracic Pain Orientation: Mid   See FIM for current functional status  Therapy/Group: Individual Therapy  [name] 07/20/2013, 4:21 PM

## 2013-07-20 NOTE — Progress Notes (Signed)
S: No new CO.   O:BP 196/84  Pulse 65  Temp(Src) 98 F (36.7 C) (Oral)  Resp 18  Ht 5' 6.14" (1.68 m)  Wt 76.3 kg (168 lb 3.4 oz)  BMI 27.03 kg/m2  SpO2 96%  Intake/Output Summary (Last 24 hours) at 07/20/13 1018 Last data filed at 07/19/13 1800  Gross per 24 hour  Intake    240 ml  Output      0 ml  Net    240 ml   Weight change:  OZH:YQMVH and alert CVS:RRR Resp:clear Abd:+ BS NTND Ext: no edema LUA AVF + bruit NEURO:CNI Able to lift legs off bed.  Ox3 no asterixis.    Marland Kitchen aspirin EC  81 mg Oral Daily  . atorvastatin  40 mg Oral q1800  . calcium carbonate  2 tablet Oral TID WC  . clopidogrel  75 mg Oral Daily  . [START ON 07/25/2013] darbepoetin (ARANESP) injection - DIALYSIS  25 mcg Intravenous Q Thu-HD  . [START ON 07/21/2013] dexamethasone  2 mg Oral Q12H  . dexamethasone  4 mg Oral Q12H  . docusate sodium  100 mg Oral BID  . doxercalciferol  4 mcg Intravenous Q T,Th,Sa-HD  . escitalopram  10 mg Oral Daily  . gabapentin  100 mg Oral TID  . insulin aspart  0-9 Units Subcutaneous TID WC  . insulin glargine  28 Units Subcutaneous Daily  . methimazole  10 mg Oral Daily  . metoprolol  50 mg Oral BID  . multivitamin  1 tablet Oral QHS  . pantoprazole  40 mg Oral Daily  . ranolazine  500 mg Oral BID   No results found. BMET    Component Value Date/Time   NA 132* 07/18/2013 0242   K 4.1 07/18/2013 0242   CL 86* 07/18/2013 0242   CO2 27 07/18/2013 0242   GLUCOSE 251* 07/18/2013 0242   BUN 41* 07/18/2013 0242   CREATININE 4.80* 07/18/2013 0242   CALCIUM 9.7 07/18/2013 0242   GFRNONAA 8* 07/18/2013 0242   GFRAA 10* 07/18/2013 0242   CBC    Component Value Date/Time   WBC 16.0* 07/18/2013 0242   RBC 2.19* 07/18/2013 0242   RBC 2.66* 06/05/2011 0156   HGB 7.1* 07/18/2013 0242   HCT 21.0* 07/18/2013 0242   PLT 289 07/18/2013 0242   MCV 95.9 07/18/2013 0242   MCH 32.4 07/18/2013 0242   MCHC 33.8 07/18/2013 0242   RDW 14.7 07/18/2013 0242   LYMPHSABS 1.5 07/17/2013 1110   MONOABS 0.3 07/17/2013 1110   EOSABS 0.0 07/17/2013 1110   BASOSABS 0.0 07/17/2013 1110     Assessment: 1. 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 DM 3. Anemia on aranesp 4. 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 ESRD 6. HTN, BP running high  Plan: 1. HD today.  Plan transfusion if Hg lower 2. Start amlodipine for non HD days.  BP does come down with HD.   Jamoni Broadfoot T

## 2013-07-20 NOTE — Progress Notes (Signed)
Patient ID: Holly Ellison, female   DOB: 1940-06-20, 73 y.o.   MRN: [id number] 73 y.o. female with history of DM type 2 with neuropathy, CAD, COPD, ESRD, HA, who was admitted on 07/16/13 pm with one week history of BLE, fecal incontinence and severe back pain. 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 Vertell Limber. Frozen section appeared benign and NS questions old or indolent osteomyelitis. Patient with history of MSSA bacteremia 07/2012 s/p 4 weeks of antibiotic therapy. CT chest without evidence of malignancy. HD ongoing and labs with evidence of ABLA with down to 7.1 as well as leucocytosis with WBC-16.0.    Subjective/Complaints: Back pain kept pt awake last night No leg pain  Review of Systems  Musculoskeletal: Positive for back pain.  All other systems reviewed and are negative.  Objective: Vital Signs: Blood pressure 196/84, pulse 65, temperature 98 F (36.7 C), temperature source Oral, resp. rate 18, height 5' 6.14" (1.68 m), weight 76.3 kg (168 lb 3.4 oz), SpO2 96.00%. No results found. Results for orders placed during the hospital encounter of 07/19/13 (from the past 72 hour(s))  GLUCOSE, CAPILLARY     Status: Abnormal   Collection Time    07/19/13  6:21 PM      Result Value Ref Range   Glucose-Capillary 295 (*) 70 - 99 mg/dL   Comment 1 Notify RN    GLUCOSE, CAPILLARY     Status: Abnormal   Collection Time    07/19/13  9:23 PM      Result Value Ref Range   Glucose-Capillary 208 (*) 70 - 99 mg/dL  GLUCOSE, CAPILLARY     Status: Abnormal   Collection Time    07/20/13  7:14 AM      Result Value Ref Range   Glucose-Capillary 242 (*) 70 - 99 mg/dL   Comment 1 Notify RN        Nursing note and vitals reviewed.  Constitutional: She is oriented to person, place, and time. She appears well-developed and well-nourished. She  is sleeping. She is easily aroused.  HENT:  Head: Normocephalic and atraumatic.  Eyes: Conjunctivae are normal. Pupils are equal, round, and reactive to light.  Neck: Normal range of motion. Neck supple.  Cardiovascular: Normal rate and regular rhythm.  Respiratory: Effort normal and breath sounds normal. No respiratory distress. She has no wheezes.  GI: Soft. Bowel sounds are normal. She exhibits no distension. There is no tenderness.  Musculoskeletal: She exhibits no edema and no tenderness.  Neurological: She is oriented to person, place, and time and easily aroused.  Speech clear. Follows commands without difficulty. Diffuse weakness--BUE/LLE>RLE. Bilateral hands with thenar atrophy and contracture with decrease in fine motor movements. Numbness stocking-glove distribution  Skin: Skin is warm and dry.  Mid thoracic back incision with honeycomb dressing and dry dressing on prior drain site.  4 minus bilateral deltoid, bicep, tricep 3 minus bilateral finger flexors and extensors  2 minus bilateral hip flexors 3 minus bilaterally knee extensors 2 minus bilateral ankle dorsiflexors and plantar flexors   Assessment/Plan: 1. Functional deficits secondary to T11 lesion with paraplegia which require 3+ hours per day of interdisciplinary therapy in a comprehensive inpatient rehab setting. Physiatrist is providing close team supervision and 24 hour management of active medical problems listed below. Physiatrist and rehab team continue to assess barriers to discharge/monitor patient progress toward functional and medical goals.  FIM:                   Comprehension Comprehension Mode: Auditory Comprehension: 5-Follows basic conversation/direction: With extra time/assistive device  Expression Expression Mode: Verbal Expression: 5-Expresses basic needs/ideas: With extra time/assistive device  Social Interaction Social Interaction: 4-Interacts appropriately 75 - 89% of the time -  Needs redirection for appropriate language or to initiate interaction.  Problem Solving Problem Solving: 4-Solves basic 75 - 89% of the time/requires cueing 10 - 24% of the time  Memory Memory: 4-Recognizes or recalls 75 - 89% of the time/requires cueing 10 - 24% of the time  Medical Problem List and Plan:  1. 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  2. DVT Prophylaxis/Anticoagulation: Mechanical: Antiembolism stockings, thigh (TED hose) Bilateral lower extremities  Sequential compression devices, below knee Bilateral lower extremities  3. Pain Management: Will continue vicodin prn as this is effective. Adjust dose 4. H/o depression/Mood: Resume lexapro. LCSW to follow for evaluation and support.  5. Neuropsych: This patient is capable of making decisions on her own behalf.  6. ESRD: Continue HD on TTS past therapy sessions.  7. DM type 2 with peripheral neuropathy: Will monitor BS with ac/hs checks. Continue lantus insulin with SSI for elevated BS. Anticipate that BS may run high while on decadron.  8. CAD: Continue plavix, lipitor, lopressor and ranexa.  9. Leucocytosis: Monitor for signs of infection with close watch on temp curve as well as wound.  10. HTN: Will monitor every 8 hours. Watch for orthostatic changes on post dialysis days.  11. Acute on chronic anemia: On aranesp. Transfusion with dialysis prn.    LOS (Days) 1 A FACE TO FACE EVALUATION WAS PERFORMED  Oceane Fosse E 07/20/2013, 10:54 AM

## 2013-07-21 ENCOUNTER — Inpatient Hospital Stay (HOSPITAL_COMMUNITY): Payer: Medicare Other | Admitting: Physical Therapy

## 2013-07-21 LAB — GLUCOSE, CAPILLARY
GLUCOSE-CAPILLARY: 145 mg/dL — AB (ref 70–99)
Glucose-Capillary: 125 mg/dL — ABNORMAL HIGH (ref 70–99)
Glucose-Capillary: 215 mg/dL — ABNORMAL HIGH (ref 70–99)
Glucose-Capillary: 200 mg/dL — ABNORMAL HIGH (ref 70–99)
Glucose-Capillary: 210 mg/dL — ABNORMAL HIGH (ref 70–99)

## 2013-07-21 MED ORDER — DARBEPOETIN ALFA-POLYSORBATE 60 MCG/0.3ML IJ SOLN
60.0000 ug | INTRAMUSCULAR | Status: DC
  Administered 2013-07-25: 60 ug via INTRAVENOUS
  Filled 2013-07-21: qty 0.3

## 2013-07-21 NOTE — Progress Notes (Signed)
Physical Therapy Session Note  Patient Details  Name: Holly Ellison MRN: [id number] Date of Birth: [birth year]  Today's Date: 07/21/2013 Time: 1450-1550 Time Calculation (min): 60 min  Short Term Goals: Week 1:  PT Short Term Goal 1 (Week 1): Pt will be able to complete supine to sit/sit to supine transfers with mod A to promote OOB activities PT Short Term Goal 2 (Week 1): Pt will be able to complete sit to stand/stand to sit transfers with min A.  PT Short Term Goal 3 (Week 1): Pt will be able to tolerate static standing for 60-75 seconds to promote increased weight bearing and pre-gait activities without knee buckling.  PT Short Term Goal 4 (Week 1): Pt will be able to ambulate 10-15 feet using the LRAD with mod A to promote increased functional mobility within the home.  PT Short Term Goal 5 (Week 1): Pt will be able to propel wheel chair 50 feet with SBA to promote functional mobility within the home and community.  Skilled Therapeutic Interventions/Progress Updates:   Session focused on functional transfers, initiation of gait training, and LE strengthening.  Pt received sitting in recliner, agreeable to therapy. Squat pivot transfer recliner <> w/c with max A. W/c propulsion with alt combo of BUE/LE with rest approx every 10 ft for total of 60 ft x 2 room <> RN station and min A, more than reasonable amount of time. In // bars, pt transferred sit <> stand with mod A, pulling up on bars. Gait training 2 x 15 ft in // bars with min A and verbal/tactile cues for upright posture and maintaining shoulders over hips as well as extending BLEs in stance phase. Pt demo increased forward lean and heavy dependence on UEs to prevent LE buckling. For LE strengthening in // bars with UE support, pt performed standing alt marching x 20, hamstrings curls x 10 each LE (d/c standing hip abd due to increased back pain), seated LAQ x 10 each LE. Pt requires frequent rest breaks with 1 episode of dizziness in  standing, see vitals below. Pt returned to room and left sitting in recliner with all needs within reach.   Therapy Documentation Precautions:  Precautions Precautions: Back;Fall Precaution Comments: Fistula L arm Restrictions Weight Bearing Restrictions: No Vital Signs: Therapy Vitals BP: 153/64 mmHg (after ambulation in // bars) Patient Position (if appropriate): Sitting Pain: Pain Assessment Pain Assessment: 0-10 Pain Score: 8  Pain Type: Surgical pain Pain Location: Back Pain Orientation: Lower;Mid Pain Descriptors / Indicators: Aching;Discomfort Pain Onset: On-going Pain Intervention(s): Repositioned;Emotional support Locomotion : Ambulation Ambulation/Gait Assistance: 4: Min assist Wheelchair Mobility Distance: 60   See FIM for current functional status  Therapy/Group: Individual Therapy  Laretta Alstrom 07/21/2013, 4:08 PM

## 2013-07-21 NOTE — Progress Notes (Signed)
S: No new CO.  Walking some with PT  Eating well O:BP 185/68  Pulse 65  Temp(Src) 98.2 F (36.8 C) (Oral)  Resp 18  Ht 5' 6.14" (1.68 m)  Wt 73.7 kg (162 lb 7.7 oz)  BMI 26.11 kg/m2  SpO2 100%  Intake/Output Summary (Last 24 hours) at 07/21/13 0836 Last data filed at 07/20/13 2243  Gross per 24 hour  Intake    240 ml  Output   2000 ml  Net  -1760 ml   Weight change: -1.4 kg (-3 lb 1.4 oz) TML:YYTKP and alert CVS:RRR Resp:clear Abd:+ BS NTND Ext: no edema LUA AVF + bruit NEURO:CNI   Ox3 no asterixis.    Marland Kitchen amLODipine  2.5 mg Oral Daily  . aspirin EC  81 mg Oral Daily  . atorvastatin  40 mg Oral q1800  . calcium carbonate  2 tablet Oral TID WC  . clopidogrel  75 mg Oral Daily  . [START ON 07/25/2013] darbepoetin (ARANESP) injection - DIALYSIS  25 mcg Intravenous Q Thu-HD  . dexamethasone  2 mg Oral Q12H  . docusate sodium  100 mg Oral BID  . doxercalciferol      . doxercalciferol  4 mcg Intravenous Q T,Th,Sa-HD  . escitalopram  10 mg Oral Daily  . gabapentin  100 mg Oral TID  . insulin aspart  0-9 Units Subcutaneous TID WC  . insulin glargine  28 Units Subcutaneous Daily  . methimazole  10 mg Oral Daily  . metoprolol  50 mg Oral BID  . multivitamin  1 tablet Oral QHS  . pantoprazole  40 mg Oral Daily  . ranolazine  500 mg Oral BID   No results found. BMET    Component Value Date/Time   NA 129* 07/20/2013 1850   K 4.0 07/20/2013 1850   CL 87* 07/20/2013 1850   CO2 25 07/20/2013 1850   GLUCOSE 310* 07/20/2013 1850   BUN 57* 07/20/2013 1850   CREATININE 5.14* 07/20/2013 1850   CALCIUM 9.7 07/20/2013 1850   GFRNONAA 8* 07/20/2013 1850   GFRAA 9* 07/20/2013 1850   CBC    Component Value Date/Time   WBC 13.9* 07/20/2013 1850   RBC 2.20* 07/20/2013 1850   RBC 2.66* 06/05/2011 0156   HGB 7.1* 07/20/2013 1850   HCT 21.4* 07/20/2013 1850   PLT 349 07/20/2013 1850   MCV 97.3 07/20/2013 1850   MCH 32.3 07/20/2013 1850   MCHC 33.2 07/20/2013 1850   RDW 14.8 07/20/2013 1850   LYMPHSABS 1.5 07/17/2013 1110   MONOABS 0.3 07/17/2013 1110   EOSABS 0.0 07/17/2013 1110   BASOSABS 0.0 07/17/2013 1110     Assessment: 1. 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 DM 3. Anemia on aranesp 4. 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 ESRD 6. HTN, BP running high  Plan: 1. HD tues. 2. Started on amlodipine and prn clonidine yest 3. Increase dose of aranesp Holly Ellison T

## 2013-07-22 ENCOUNTER — Inpatient Hospital Stay (HOSPITAL_COMMUNITY): Payer: Medicare Other

## 2013-07-22 ENCOUNTER — Encounter (HOSPITAL_COMMUNITY): Payer: Medicare Other

## 2013-07-22 ENCOUNTER — Inpatient Hospital Stay (HOSPITAL_COMMUNITY): Payer: Medicare Other | Admitting: *Deleted

## 2013-07-22 LAB — GLUCOSE, CAPILLARY
GLUCOSE-CAPILLARY: 172 mg/dL — AB (ref 70–99)
GLUCOSE-CAPILLARY: 207 mg/dL — AB (ref 70–99)
Glucose-Capillary: 125 mg/dL — ABNORMAL HIGH (ref 70–99)
Glucose-Capillary: 146 mg/dL — ABNORMAL HIGH (ref 70–99)

## 2013-07-22 MED ORDER — CLONIDINE HCL 0.1 MG PO TABS
0.1000 mg | ORAL_TABLET | ORAL | Status: AC
  Administered 2013-07-22: 0.1 mg via ORAL
  Filled 2013-07-22: qty 1

## 2013-07-22 NOTE — Progress Notes (Signed)
Occupational Therapy Session Note  Patient Details  Name: Holly Ellison MRN: [id number] Date of Birth: [birth year]  Today's Date: 07/22/2013  Session 1 Time: 1000-1100 Time Calculation (min): 60 min  Short Term Goals: Week 1:  OT Short Term Goal 1 (Week 1): Pt. will feed self with AE prn with no assist OT Short Term Goal 2 (Week 1): Pt. will bath LB with AE with mod assist OT Short Term Goal 3 (Week 1): pt. will stand for LB bathing with mod assist OT Short Term Goal 4 (Week 1): Pt. will transfer to dropped arm cc with mod assist  Skilled Therapeutic Interventions/Progress Updates:    Pt sitting in w/c upon arrival.  Pt declined bathing and dressing this morning but eager to work on BUE strength and endurance.  Pt stated she had difficulty feeding herself because her hands fatigued quickly.  Foam tubing issued for use with utensils and education provided.  Pt engaged in BUE therex including wrist flexion/extension, grasp, and shoulder flexion/extension.  Pt transitioned to UE ergometer and performed 2 sets X 1 min at level 1. Pt required 3 min rest break between sets.  Focus on activity tolerance and increased BUE strength to address increased independence with BADLs.  Therapy Documentation Precautions:  Precautions Precautions: Back;Fall Precaution Comments: Fistula L arm Restrictions Weight Bearing Restrictions: No General:   Vital Signs: Therapy Vitals Pulse Rate: 68 BP: 162/70 mmHg Patient Position (if appropriate): Sitting Pain: Pain Assessment Pain Assessment: No/denies pain  See FIM for current functional status  Therapy/Group: Individual Therapy  Session 2 Time: 5883-2549 Pt denied pain Individual Therapy  Pt sitting EOB upon arrival and agreeable to therapy.  Pt initially engaged in sit<>stand from EOB and semi squats from EOB requiring mod/max A.  Pt also practiced sit<>supine requiring min A.  Pt engaged in bed mobility including rolling side to side and  repositioning in bed without assistance.  Pt transitioned to hand fine motor tasks to increased patient's ability to perform BADLs (especially holding utensils). Focus on activity tolerance, bed mobility, fine motor tasks, and safety awareness.  Leotis Shames [facility] 07/22/2013, 11:05 AM

## 2013-07-22 NOTE — Progress Notes (Addendum)
[facility] Progress Note   Subjective: Doing well on rehab, no complaints  Filed Vitals:   07/21/13 1607 07/21/13 2043 07/21/13 2220 07/22/13 0549  BP: 153/64 192/84 217/83 179/71  Pulse:  98 69 61  Temp:  98 F (36.7 C) 98.1 F (36.7 C) 97.9 F (36.6 C)  TempSrc:  Oral Oral Oral  Resp:  18 18 18   Height:      Weight:    76 kg (167 lb 8.8 oz)  SpO2:  98% 99% 96%   Exam: Awake , alert RRR Chest is clear Abd soft, NTND No LE edema, LUA AVF patent Neuro is nf, Ox3  HD: [facility]  TTS 4h   2/2.25 Bath  73kg   LUA AVF  Heparin 4500 Aranesp 25 mcg q 4 weeks, last 6/9   Hectorol 4 ug TIW Tsat 26%, ferr 1119, pth 286        Assessment: 1 Cord compression / BLE weakness / 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-  s/p laminectomy 6/915; no frank tumor per OP note, multiple biopsies sent, no results in computer yet 2 ESRD on HD 3 Anemia on aranesp 60, Hb 7's 4 HPTH on vit D, tums ac 5 HTN 3 kg up, on 2 BP meds 6 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 7 COPD 8 DM on insulin  Plan- HD tomorrow    [name] MD  pager [phone]    cell [phone]  07/22/2013, 8:45 AM     Recent Labs Lab 07/17/13 1110 07/18/13 0242 07/20/13 1850  NA 137 132* 129*  K 3.9 4.1 4.0  CL 90* 86* 87*  CO2 23 27 25   GLUCOSE 197* 251* 310*  BUN 25* 41* 57*  CREATININE 3.85* 4.80* 5.14*  CALCIUM 9.5 9.7 9.7  PHOS  --  5.3* 2.8    Recent Labs Lab 07/17/13 1110 07/18/13 0242 07/20/13 1850  AST 21  --   --   ALT 10  --   --   ALKPHOS 96  --   --   BILITOT 0.3  --   --   PROT 8.0  --   --   ALBUMIN 3.5 3.1* 2.8*    Recent Labs Lab 07/17/13 1110 07/17/13 1210 07/18/13 0242 07/20/13 1850  WBC 16.9*  --  16.0* 13.9*  NEUTROABS 15.1*  --   --   --   HGB 4.9* 8.2* 7.1* 7.1*  HCT 14.3* 24.7* 21.0* 21.4*  MCV 96.6  --  95.9 97.3  PLT 411*  --  289 349   . amLODipine  2.5 mg Oral Daily  . aspirin EC  81 mg Oral Daily  . atorvastatin  40 mg Oral q1800  . calcium carbonate  2  tablet Oral TID WC  . clopidogrel  75 mg Oral Daily  . [START ON 07/25/2013] darbepoetin (ARANESP) injection - DIALYSIS  60 mcg Intravenous Q Thu-HD  . dexamethasone  2 mg Oral Q12H  . docusate sodium  100 mg Oral BID  . doxercalciferol  4 mcg Intravenous Q T,Th,Sa-HD  . escitalopram  10 mg Oral Daily  . gabapentin  100 mg Oral TID  . insulin aspart  0-9 Units Subcutaneous TID WC  . insulin glargine  28 Units Subcutaneous Daily  . methimazole  10 mg Oral Daily  . metoprolol  50 mg Oral BID  . multivitamin  1 tablet Oral QHS  . pantoprazole  40 mg Oral Daily  . ranolazine  500 mg Oral BID  sodium chloride, sodium chloride, acetaminophen, albuterol, aluminum hydroxide, bisacodyl, calcium carbonate, cloNIDine, feeding supplement (NEPRO CARB STEADY), guaiFENesin-dextromethorphan, heparin, HYDROcodone-acetaminophen, lidocaine (PF), lidocaine-prilocaine, menthol-cetylpyridinium, methocarbamol, ondansetron (ZOFRAN) IV, pentafluoroprop-tetrafluoroeth, phenol, polyethylene glycol

## 2013-07-22 NOTE — IPOC Note (Signed)
Overall Plan of Care [facility]) Patient Details Name: Holly Ellison MRN: [id number] DOB: [birth year]  Admitting Diagnosis: T11 sp cord [facility] Problems: Active Problems:   Compression of spinal cord with myelopathy     Functional Problem List: Nursing Bladder;Bowel;Endurance;Medication Management;Pain;Safety;Skin Integrity  PT Balance;Endurance;Motor;Pain;Safety;Sensory  OT Balance;Cognition;Endurance;Motor;Pain;Safety;Sensory;Skin Integrity;Vision  SLP    TR         Basic ADL's: OT Eating;Grooming;Bathing;Dressing;Toileting     Advanced  ADL's: OT       Transfers: PT Bed Mobility;Bed to Chair;Car;Furniture  OT Toilet;Tub/Shower     Locomotion: PT Ambulation;Wheelchair Mobility     Additional Impairments: OT Fuctional Use of Upper Extremity  SLP        TR      Anticipated Outcomes Item Anticipated Outcome  Self Feeding mod I  Swallowing      Basic self-care  minimal assist  Toileting  minimal assist   Bathroom Transfers minimal assit  Bowel/Bladder  Pt. will continue continent to bladder and bowel  Transfers  Supervision  Locomotion  Minimal Assistance  Communication     Cognition     Pain  Pain will be <than 3,on scale 1 to 10  Safety/Judgment  Pt. will be free from falls during this admissio   Therapy Plan: PT Intensity: Minimum of 1-2 x/day ,45 to 90 minutes PT Frequency: 5 out of 7 days PT Duration Estimated Length of Stay: 18-21 Days OT Intensity: Minimum of 1-2 x/day, 45 to 90 minutes OT Frequency: 5 out of 7 days OT Duration/Estimated Length of Stay:  18-21  days         Team Interventions: Nursing Interventions Patient/Family Education;Bladder Management;Bowel Management;Disease Management/Prevention;Pain Management;Medication Management;Skin Care/Wound Management  PT interventions Ambulation/gait training;Community reintegration;Discharge planning;Disease management/prevention;DME/adaptive equipment instruction;Functional  mobility training;Neuromuscular re-education;Pain management;Patient/family education;Psychosocial support;Skin care/wound management;Splinting/orthotics;Therapeutic Activities;Therapeutic Exercise;UE/LE Strength taining/ROM;UE/LE Coordination activities;Visual/perceptual remediation/compensation;Wheelchair propulsion/positioning  OT Interventions Balance/vestibular training;Cognitive remediation/compensation;Discharge planning;DME/adaptive equipment instruction;Functional mobility training;Neuromuscular re-education;Pain management;Patient/family education;Self Care/advanced ADL retraining;Therapeutic Activities;Therapeutic Exercise;UE/LE Strength taining/ROM;UE/LE Coordination activities;Wheelchair propulsion/positioning  SLP Interventions    TR Interventions    SW/CM Interventions      Team Discharge Planning: Destination: PT-Home ,OT- Home , SLP-  Projected Follow-up: PT-Home health PT;24 hour supervision/assistance, OT-  Home health OT, SLP-  Projected Equipment Needs: PT- , OT- 3 in 1 bedside comode;Tub/shower bench, SLP-  Equipment Details: PT- , OT-  Patient/family involved in discharge planning: PT- Patient,  OT-Patient, SLP-   MD ELOS: 18-20 days Medical Rehab Prognosis:  Excellent Assessment: The patient has been admitted for CIR therapies with the diagnosis of T11 spinal cord compression. The team will be addressing functional mobility, strength, stamina, balance, safety, adaptive techniques and equipment, self-care, bowel and bladder mgt, patient and caregiver education, back management, pain mgt, egosupport, NMR, leisure awareness. Goals have been set at supervision to min assist for mobility and transfers and min assist for self-care   Meredith Staggers, MD, [facility]      See Team Conference Notes for weekly updates to the plan of care

## 2013-07-22 NOTE — Progress Notes (Signed)
Physical Therapy Session Note  Patient Details  Name: Holly Ellison MRN: [id number] Date of Birth: 10/28/1940  Today's Date: 07/22/2013 Time: 4098-1191 Time Calculation (min): 50 min  Short Term Goals: Week 1:  PT Short Term Goal 1 (Week 1): Pt will be able to complete supine to sit/sit to supine transfers with mod A to promote OOB activities PT Short Term Goal 2 (Week 1): Pt will be able to complete sit to stand/stand to sit transfers with min A.  PT Short Term Goal 3 (Week 1): Pt will be able to tolerate static standing for 60-75 seconds to promote increased weight bearing and pre-gait activities without knee buckling.  PT Short Term Goal 4 (Week 1): Pt will be able to ambulate 10-15 feet using the LRAD with mod A to promote increased functional mobility within the home.  PT Short Term Goal 5 (Week 1): Pt will be able to propel wheel chair 50 feet with SBA to promote functional mobility within the home and community.  Skilled Therapeutic Interventions/Progress Updates:   Pt reclined in bed, willing to participate in tx.  New LSO delivered by Ortho Kemper Durie.  Log rolling R with cues and min assist to sit with HOB elevated.  Initiated instruction in donning LSO; pt did so with mod assist.  Squat pivot transfer > L wearing LSO with mod assist from slightly higher bed.  W/c legrests adjusted for improved fit and function and to decrease pull on low back.  Therapeutic exercise performed with LE to increase strength for functional mobility: kInetron in sitting with varying resistance focusing on full motion with LLE; seated hip abd bil against green Theraband, 20 x 1; long arc quad ext with isometric hold focusing on eccentric control, 10 x 1 L and R. Therapist stretched bil hamstrings and heel cords.  Gait training in // with min assist sit> stand with pt pulling up on bars, x 6', x 6' with mod assist for blocking R knee, trunk elevation.  After first walk, pt c/o R knee buckling and required max  assist to support her as w/c brought up behind.     Therapy Documentation Precautions:  Precautions Precautions: Back;Fall Precaution Comments: Fistula L arm Required Braces or Orthoses: Spinal Brace Restrictions Weight Bearing Restrictions: No   Pain: Pain Assessment Pain Score: 8  Pain Location: Back Pain Orientation: Lower Pain Intervention(s): RN made aware     See FIM for current functional status  Therapy/Group: Individual Therapy  Kellyanne Ellwanger 07/22/2013, 3:58 PM

## 2013-07-22 NOTE — Progress Notes (Signed)
Physical Therapy Session Note  Patient Details  Name: Holly Ellison MRN: [id number] Date of Birth: 09-Apr-1940  Today's Date: 07/22/2013 Time: 0800-0855 Time Calculation (min): 55 min  Short Term Goals: Week 1:  PT Short Term Goal 1 (Week 1): Pt will be able to complete supine to sit/sit to supine transfers with mod A to promote OOB activities PT Short Term Goal 2 (Week 1): Pt will be able to complete sit to stand/stand to sit transfers with min A.  PT Short Term Goal 3 (Week 1): Pt will be able to tolerate static standing for 60-75 seconds to promote increased weight bearing and pre-gait activities without knee buckling.  PT Short Term Goal 4 (Week 1): Pt will be able to ambulate 10-15 feet using the LRAD with mod A to promote increased functional mobility within the home.  PT Short Term Goal 5 (Week 1): Pt will be able to propel wheel chair 50 feet with SBA to promote functional mobility within the home and community.  Skilled Therapeutic Interventions/Progress Updates:  1:1. Pt received semi-reclined in bed, ready for therapy. Focus this session on functional transfers and w/c propulsion. Pt practiced multiple t/f sup<>sit EOB, req mod A in hospital bed w/ use of bed rail and mod-max A in standard bed utilizing log roll technique. Pt hesitates to initiate transfer due to fear of increased pain, reporting 8/10 pain (RN aware). Pt req min-mod A for multiple squat pivot transfer bed<>w/c as well as scooting EOB w/ cues for increased anterior lean. Pt req max A for w/c propulsion x50' w/ B UE/LE, consistent cues for technique and tolerance limited by back pain. Pt w/ reports throughout session of double vision (MD aware- chronic problem per pt report) and dizziness w/ transitional movements that decreases w/ rest. Pt req mod A for t/f sit<>stand at sink to pull up pants and x4 w/ use of B UE on single // bar in gym for emphasis on technique and quad control. Pt sitting in w/c at end of session w/ all  needs in reach.   Therapy Documentation Precautions:  Precautions Precautions: Back;Fall Precaution Comments: Fistula L arm Restrictions Weight Bearing Restrictions: No  See FIM for current functional status  Therapy/Group: Individual Therapy  Gilmore Laroche 07/22/2013, 9:00 AM

## 2013-07-22 NOTE — Progress Notes (Signed)
Patient information reviewed and entered into eRehab system by Corleone Biegler, RN, CRRN, PPS Coordinator.  Information including medical coding and functional independence measure will be reviewed and updated through discharge.     Per nursing patient was given "Data Collection Information Summary for Patients in Inpatient Rehabilitation Facilities with attached "Privacy Act Statement-Health Care Records" upon admission.  

## 2013-07-23 ENCOUNTER — Inpatient Hospital Stay (HOSPITAL_COMMUNITY): Payer: Medicare Other

## 2013-07-23 ENCOUNTER — Inpatient Hospital Stay (HOSPITAL_COMMUNITY): Payer: Medicare Other | Admitting: Physical Therapy

## 2013-07-23 ENCOUNTER — Encounter (HOSPITAL_COMMUNITY): Payer: Medicare Other

## 2013-07-23 DIAGNOSIS — G8220 Paraplegia, unspecified: Secondary | ICD-10-CM

## 2013-07-23 LAB — CULTURE, BLOOD (ROUTINE X 2)
CULTURE: NO GROWTH
Culture: NO GROWTH

## 2013-07-23 LAB — GLUCOSE, CAPILLARY
GLUCOSE-CAPILLARY: 156 mg/dL — AB (ref 70–99)
GLUCOSE-CAPILLARY: 148 mg/dL — AB (ref 70–99)
GLUCOSE-CAPILLARY: 108 mg/dL — AB (ref 70–99)
Glucose-Capillary: 159 mg/dL — ABNORMAL HIGH (ref 70–99)

## 2013-07-23 MED ORDER — HYDROCODONE-ACETAMINOPHEN 5-325 MG PO TABS
ORAL_TABLET | ORAL | Status: AC
  Filled 2013-07-23: qty 2

## 2013-07-23 MED ORDER — LIDOCAINE HCL (PF) 1 % IJ SOLN: 5.0000 mL | INTRAMUSCULAR | Status: DC | PRN

## 2013-07-23 MED ORDER — NEPRO/CARBSTEADY PO LIQD
237.0000 mL | ORAL | Status: DC | PRN
  Filled 2013-07-23: qty 237

## 2013-07-23 MED ORDER — LIDOCAINE-PRILOCAINE 2.5-2.5 % EX CREA
1.0000 "application " | TOPICAL_CREAM | CUTANEOUS | Status: DC | PRN
  Filled 2013-07-23: qty 5

## 2013-07-23 MED ORDER — INSULIN GLARGINE 100 UNIT/ML ~~LOC~~ SOLN
30.0000 [IU] | Freq: Every day | SUBCUTANEOUS | Status: DC
  Administered 2013-07-24 – 2013-07-25 (×2): 30 [IU] via SUBCUTANEOUS
  Filled 2013-07-23 (×2): qty 0.3

## 2013-07-23 MED ORDER — HEPARIN SODIUM (PORCINE) 1000 UNIT/ML DIALYSIS: 1000.0000 [IU] | INTRAMUSCULAR | Status: DC | PRN

## 2013-07-23 MED ORDER — SODIUM CHLORIDE 0.9 % IV SOLN: 100.0000 mL | INTRAVENOUS | Status: DC | PRN

## 2013-07-23 MED ORDER — ALTEPLASE 2 MG IJ SOLR
2.0000 mg | Freq: Once | INTRAMUSCULAR | Status: DC | PRN
  Filled 2013-07-23: qty 2

## 2013-07-23 MED ORDER — PENTAFLUOROPROP-TETRAFLUOROETH EX AERO
1.0000 | INHALATION_SPRAY | CUTANEOUS | Status: DC | PRN
Start: 2013-07-23 — End: 2013-07-23

## 2013-07-23 MED ORDER — AMLODIPINE BESYLATE 5 MG PO TABS
5.0000 mg | ORAL_TABLET | Freq: Every day | ORAL | Status: DC
  Administered 2013-07-24 – 2013-08-15 (×18): 5 mg via ORAL
  Filled 2013-07-23 (×26): qty 1

## 2013-07-23 NOTE — Care Management Note (Signed)
[facility] Individual Statement of Services  Patient Name:  Holly Ellison  Date:  07/22/2013  Welcome to the [facility].  Our goal is to provide you with an individualized program based on your diagnosis and situation, designed to meet your specific needs.  With this comprehensive rehabilitation program, you will be expected to participate in at least 3 hours of rehabilitation therapies Monday-Friday, with modified therapy programming on the weekends.  Your rehabilitation program will include the following services:  Physical Therapy (PT), Occupational Therapy (OT), 24 hour per day rehabilitation nursing, Therapeutic Recreaction (TR), Case Management (Social Worker), Rehabilitation Medicine, Nutrition Services and Pharmacy Services  Weekly team conferences will be held on Tuesdays to discuss your progress.  Your Social Worker will talk with you frequently to get your input and to update you on team discussions.  Team conferences with you and your family in attendance may also be held.  Expected length of stay: 2-3 weeks  Overall anticipated outcome: minimal assistance  Depending on your progress and recovery, your program may change. Your Social Worker will coordinate services and will keep you informed of any changes. Your Social Worker's name and contact numbers are listed  below.  The following services may also be recommended but are not provided by the [facility] will be made to provide these services after discharge if needed.  Arrangements include referral to agencies that provide these services.  Your insurance has been verified to be:  Medicare Your primary doctor is:  Dr. Salvadore Oxford  Pertinent information will be shared with your doctor and your insurance company.  Social Worker:  [facility], [place] or [phone]   Information discussed with and copy given to patient by: Lennart Pall, 07/22/2013, 5:58 PM

## 2013-07-23 NOTE — Progress Notes (Signed)
Physical Therapy Session Note  Patient Details  Name: Holly Ellison MRN: [id number] Date of Birth: 03-07-1940  Today's Date: 07/23/2013 Time: 0910-1000 Time Calculation (min): 50 min  Short Term Goals: Week 1:  PT Short Term Goal 1 (Week 1): Pt will be able to complete supine to sit/sit to supine transfers with mod A to promote OOB activities PT Short Term Goal 2 (Week 1): Pt will be able to complete sit to stand/stand to sit transfers with min A.  PT Short Term Goal 3 (Week 1): Pt will be able to tolerate static standing for 60-75 seconds to promote increased weight bearing and pre-gait activities without knee buckling.  PT Short Term Goal 4 (Week 1): Pt will be able to ambulate 10-15 feet using the LRAD with mod A to promote increased functional mobility within the home.  PT Short Term Goal 5 (Week 1): Pt will be able to propel wheel chair 50 feet with SBA to promote functional mobility within the home and community.  Skilled Therapeutic Interventions/Progress Updates:  1:1. Pt received sitting in w/c, with RN in room. Pt missed 8min at start of session due to nursing needs. Focus this session on functional transfers, B LE strength/endurance and ambulation.  Pt req mod A squat pivot t/f w/c<>bed and t/f sup<>sit w/ HOB flat and use of bed rails. Emphasis on log roll technique, pt stating increased comfort w/ use of bed rails. Pt performed supine therex to target B LE strength/endurance, exercises included: 2x10 reps of: clam shells, glute sets and hip abd squeezes. Pt practiced multiple t/f sit<>stand in // bars to target strength and technique, initially req modA decreasing to min guard A. Pt able to progress to amb 10'x2 w/ min-mod A in // bars. Pt demonstrating progressive B LE flexion due to fatigue, w/ narrow BOS. Therapist assisting w/ advancement/placement of B LE due to decreased strength/proprioception. Pt req seated rest throughout session due to fatigue. Pt sitting in w/c at end of  session w/ all needs in reach.   Therapy Documentation Precautions:  Precautions Precautions: Back;Fall Precaution Comments: Fistula L arm Required Braces or Orthoses: Spinal Brace Restrictions Weight Bearing Restrictions: No Pain: Pain Assessment Pain Assessment: 0-10 Pain Score: 4  Pain Type: Acute pain Pain Location: Back Pain Orientation: Lower Pain Descriptors / Indicators: Aching Pain Onset: On-going Pain Intervention(s): Other (Comment) (LSO donned)  See FIM for current functional status  Therapy/Group: Individual Therapy  Gilmore Laroche 07/23/2013, 12:07 PM

## 2013-07-23 NOTE — Progress Notes (Signed)
Social Work  Social Work Assessment and Plan  Patient Details  Name: Holly Ellison MRN: [id number] Date of Birth: 1940-06-10  Today's Date: 07/22/2013  Problem List:  Patient Active Problem List   Diagnosis Date Noted  . Compression of spinal cord with myelopathy 07/19/2013  . Paraparesis 07/17/2013  . Bacteremia due to Staphylococcus aureus 07/28/2012  . HCAP (healthcare-associated pneumonia) 07/27/2012  . ESRD on dialysis 07/27/2012  . Chest pain 10/29/2011  . Hypertensive emergency 10/29/2011  . Bell's palsy 10/29/2011  . Hyperthyroidism 10/29/2011  . Pulmonary edema 06/05/2011  . 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 Peripheral neuropathy 06/05/2011  . Diabetes mellitus 06/05/2011  . Hypertension 06/05/2011  . COPD (chronic obstructive pulmonary disease) 06/05/2011  . Atherosclerosis of native arteries of the extremities with ulceration 05/25/2011   Past Medical History:  Past Medical History  Diagnosis Date  . Chronic kidney disease     just found out? Placed graft in NOv-Dr. Tarri Glenn in [facility].  started dialysis in 06/2011  . Arthritis     Osteoarthritis  . Neuropathy in diabetes   . Insomnia   . Asthma   . CAD (coronary artery disease) 2012    7 stents-First PCI was in [place].  The last time she had a procedure was  a stent last year, thena  CABG was undertaken in October [facility])  . GERD (gastroesophageal reflux disease)   . Diabetes mellitus     Type II  . COPD (chronic obstructive pulmonary disease)   . Hypertension   . Peripheral vascular disease     endarterectomy  . Anemia   . CHF (congestive heart failure)   . Coronary artery dilation   . Myocardial infarction 2013    x 3  . 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 H/O Bell's palsy   . Frequent headaches   . Orthostasis    Past Surgical History:  Past Surgical History  Procedure Laterality Date  . Abdominal hysterectomy    . Coronary artery  bypass graft  2012    7 stents  . Cea      Left  . Carpal tunnel release    . Av fistula placement    . Grave's disease      ? unclea rif ca or not   . Laminectomy N/A 07/16/2013    Procedure: THORACIC LAMINECTOMY FOR TUMOR;  Surgeon: Erline Levine, MD;  Location: [facility];  Service: Neurosurgery;  Laterality: N/A;   Social History:  reports that she quit smoking about 17 years ago. Her smoking use included Cigarettes. She smoked 0.00 packs per day. She has never used smokeless tobacco. She reports that she does not drink alcohol or use illicit drugs.  Family / Support Systems Marital Status: Married How Long?: pt reports that her husband is in a SNF in [place] where one of their children live Patient Roles: Parent Children: daughter (lives with pt), Elmo Putt Little @ (C) [phone] and son (local) Dwayne @ (C) [phone] plus two other children lving in [place] and [place]. Anticipated Caregiver: son Karma Greaser will be primary caregiver (pt lives with dtr but dtr has crazy work schedule with 72 yo ) Ability/Limitations of Caregiver: pt actually reports that son is not currently working and "does alot of nothing so he can be there to help me." (son works nights and then helps him Mom during the day) Caregiver Availability: 24/7 (son will be primary caregiver, dtr is  limited in what help s) Family Dynamics: pt notes that she fully expects her two children will provide all assistance she needs.  Son present at this time and nods "yes" in agreement.  Social History Preferred language: English Religion:  Cultural Background: NA Read: Yes Write: Yes Employment Status: Disabled Date Retired/Disabled/Unemployed: 1999 Freight forwarder Issues: none Guardian/Conservator: none - per MD, pt capable of making decisions on her own behalf   Abuse/Neglect Physical Abuse: Denies Verbal Abuse: Denies Sexual Abuse: Denies Exploitation of patient/patient's resources: Denies Self-Neglect:  Denies  Emotional Status Pt's affect, behavior adn adjustment status: Pt very pleasant, oriented and "ready" for tx.  Denies any significant emotional distress or concern about d/c home.  No s/s of depression or anxiety.  Will monitor and refer to neuropsych as appropriate. Recent Psychosocial Issues: Moved to [place] in 2013 when she began HD to live with daughter so family could assist. Pyschiatric History: None Substance Abuse History: None  Patient / Family Perceptions, Expectations & Goals Pt/Family understanding of illness & functional limitations: pt and family with basic understanding of pt's thoracic lesion, surgery performed and current functional limitations/ need for CIR. Premorbid pt/family roles/activities: Pt was independent overall until approx one month prior with declining function and significant increase in pain.   Anticipated changes in roles/activities/participation: Pt will likely now require assistance at home.  Son and daughter to assume primary caregiver roles. 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, learning disability) Transportation available at discharge: yes - [facility] or family  Discharge Planning Living Arrangements: Children Support Systems: Children Type of Residence: Private residence Insurance Resources: [employer] Financial Resources: [place] Referred: No Living Expenses: Lives with family Money Management: Patient;Family Does the patient have any problems obtaining your medications?: No Home Management: shared with family Patient/Family Preliminary Plans: Pt fully intends to d/c home with daughter and son sharing in assistance needs Social Work Anticipated Follow Up Needs:  HH/OP  Clinical Impression Very pleasant, oriented, elderly woman here following thoracic surgery for removal of lesion.  She is "very pleased" that they do not believe lesion to be cancerous and that her pain has significantly decreased since surgery.  Good family support with primary focus being on rebuilding her overall strength.  Will follow for support and d/c planning needs.  Lanijah Warzecha 07/22/2013, 5:55 PM

## 2013-07-23 NOTE — Progress Notes (Signed)
Physical Therapy Session Note  Patient Details  Name: Holly Ellison MRN: [id number] Date of Birth: 02-09-1940  Today's Date: 07/23/2013 Time: 1138-1208 and [id number] Time Calculation (min): 30 min and 45 min  Short Term Goals: Week 1:  PT Short Term Goal 1 (Week 1): Pt will be able to complete supine to sit/sit to supine transfers with mod A to promote OOB activities PT Short Term Goal 2 (Week 1): Pt will be able to complete sit to stand/stand to sit transfers with min A.  PT Short Term Goal 3 (Week 1): Pt will be able to tolerate static standing for 60-75 seconds to promote increased weight bearing and pre-gait activities without knee buckling.  PT Short Term Goal 4 (Week 1): Pt will be able to ambulate 10-15 feet using the LRAD with mod A to promote increased functional mobility within the home.  PT Short Term Goal 5 (Week 1): Pt will be able to propel wheel chair 50 feet with SBA to promote functional mobility within the home and community.  Skilled Therapeutic Interventions/Progress Updates:   Treatment 1: Session focused on transfer training, LE strengthening, and sit <> stand using RW. Pt received sitting in w/c, requesting to remove LSO due to brace riding up while seated. Pt instructed in doffing LSO with min A. Pt given option to don LSO in preparation for standing and declined at this time. Transfer training w/c <> level mat table via squat pivot transfer x 2 with min A, verbal cues for correct hand placement and head/hips relationship. Pt able to assist in directing w/c setup for transfer with mod A. For BLE strengthening sitting EOM, pt performed LAQ and hip flex 2 x 10 each exercise. Pt performed sit <> stand x 3 from mat table using RW with min A. In standing, pt requires verbal cues for upright posture and maintaining shoulders over hips due to forward flexed posture leaning on RW. Pt able to maintain standing 1-2 min each trial. Pt requires mod-max A for squat pivot transfer mat > w/c  at end of session due to fatigue and increased low back pain (unrated). Pt reporting, "I can tell a difference without the LSO." Discussed donning LSO for therapies, doffing in between while resting throughout day. Pt in agreement with plan. Pt returned to room and left sitting in w/c with all needs within reach.   Treatment 2: Pt received sitting in w/c, son in room. Continued focus on w/c parts management, transfer training, standing tolerance, and LE strengthening. Mirror for visual feedback, pt unable to release leg rests despite HOH assist, total A for leg rest mgmt. Donned LSO sitting in w/c with mod A due to UE weakness. Squat pivot transfer training mat <> w/c with emphasis on head/hips relationship with min A, pt with improved demonstration of correct hand placement to facilitate head/hips relationship. Attempted mirror for visual feedback to facilitate upright posture in standing, pt declining to look at self in mirror. Pt performed sit <> stand x 2 from w/c using RW with min-mod A to participate in functional task of placing yellow clothespins on vertical rod at raised table to for upright posture, standing tolerance, and UE NMR. Pt able to place 3 clothespins on first trial before pt fatigued and unable to place any clothespins on second trial before requiring seated rest. For BLE strengthening, kinetron from w/c level 4 x 2 min, prolonged rest between trials. Pt returned to room and left sitting in w/c with all needs within reach, son in  room.   Therapy Documentation Precautions:  Precautions Precautions: Back;Fall Precaution Comments: Fistula L arm Required Braces or Orthoses: Spinal Brace Restrictions Weight Bearing Restrictions: No Pain: Pain Assessment Pain Assessment: 0-10 Pain Score: 4  Pain Type: Acute pain Pain Location: Back Pain Orientation: Lower Pain Descriptors / Indicators: Aching Pain Onset: On-going Pain Intervention(s): Other (Comment) (LSO donned)  See FIM for  current functional status  Therapy/Group: Individual Therapy  Laretta Alstrom 07/23/2013, 12:24 PM

## 2013-07-23 NOTE — Progress Notes (Signed)
Patient ID: Holly Ellison, female   DOB: [birth year], 73 y.o.   MRN: [id number] 73 y.o. female with history of DM type 2 with neuropathy, CAD, COPD, ESRD, HA, who was admitted on 07/16/13 pm with one week history of BLE, fecal incontinence and severe back pain. 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 Vertell Limber. Frozen section appeared benign and NS questions old or indolent osteomyelitis. Patient with history of MSSA bacteremia 07/2012 s/p 4 weeks of antibiotic therapy. CT chest without evidence of malignancy. HD ongoing and labs with evidence of ABLA with down to 7.1 as well as leucocytosis with WBC-16.0.    Subjective/Complaints: Concerned about elevated BP's. Pain a little better. No leg pain  Review of Systems  Musculoskeletal: Positive for back pain.  All other systems reviewed and are negative.  Objective: Vital Signs: Blood pressure 179/68, pulse 52, temperature 97.6 F (36.4 C), temperature source Oral, resp. rate 18, height 5' 6.14" (1.68 m), weight 79.9 kg (176 lb 2.4 oz), SpO2 99.00%. No results found. Results for orders placed during the hospital encounter of 07/19/13 (from the past 72 hour(s))  GLUCOSE, CAPILLARY     Status: Abnormal   Collection Time    07/20/13 11:24 AM      Result Value Ref Range   Glucose-Capillary 211 (*) 70 - 99 mg/dL   Comment 1 Notify RN    GLUCOSE, CAPILLARY     Status: Abnormal   Collection Time    07/20/13  4:31 PM      Result Value Ref Range   Glucose-Capillary 234 (*) 70 - 99 mg/dL   Comment 1 Notify RN    CBC     Status: Abnormal   Collection Time    07/20/13  6:50 PM      Result Value Ref Range   WBC 13.9 (*) 4.0 - 10.5 K/uL   RBC 2.20 (*) 3.87 - 5.11 MIL/uL   Hemoglobin 7.1 (*) 12.0 - 15.0 g/dL   HCT 21.4 (*) 36.0 - 46.0 %   MCV 97.3  78.0 - 100.0 fL   MCH 32.3  26.0 - 34.0 pg    MCHC 33.2  30.0 - 36.0 g/dL   RDW 14.8  11.5 - 15.5 %   Platelets 349  150 - 400 K/uL  RENAL FUNCTION PANEL     Status: Abnormal   Collection Time    07/20/13  6:50 PM      Result Value Ref Range   Sodium 129 (*) 137 - 147 mEq/L   Potassium 4.0  3.7 - 5.3 mEq/L   Chloride 87 (*) 96 - 112 mEq/L   CO2 25  19 - 32 mEq/L   Glucose, Bld 310 (*) 70 - 99 mg/dL   BUN 57 (*) 6 - 23 mg/dL   Creatinine, Ser 5.14 (*) 0.50 - 1.10 mg/dL   Calcium 9.7  8.4 - 10.5 mg/dL   Phosphorus 2.8  2.3 - 4.6 mg/dL   Albumin 2.8 (*) 3.5 - 5.2 g/dL   GFR calc non Af Amer 8 (*) >90 mL/min   GFR calc Af Amer 9 (*) >90 mL/min   Comment: (NOTE)     The eGFR has been calculated using the CKD EPI equation.     This calculation has not been validated in all clinical situations.     eGFR's persistently <90 mL/min signify possible Chronic Kidney  Disease.  GLUCOSE, CAPILLARY     Status: Abnormal   Collection Time    07/21/13 12:36 AM      Result Value Ref Range   Glucose-Capillary 145 (*) 70 - 99 mg/dL  GLUCOSE, CAPILLARY     Status: Abnormal   Collection Time    07/21/13  7:05 AM      Result Value Ref Range   Glucose-Capillary 125 (*) 70 - 99 mg/dL   Comment 1 Notify RN    GLUCOSE, CAPILLARY     Status: Abnormal   Collection Time    07/21/13 11:25 AM      Result Value Ref Range   Glucose-Capillary 215 (*) 70 - 99 mg/dL   Comment 1 Notify RN    GLUCOSE, CAPILLARY     Status: Abnormal   Collection Time    07/21/13  4:19 PM      Result Value Ref Range   Glucose-Capillary 200 (*) 70 - 99 mg/dL   Comment 1 Notify RN    GLUCOSE, CAPILLARY     Status: Abnormal   Collection Time    07/21/13  9:29 PM      Result Value Ref Range   Glucose-Capillary 210 (*) 70 - 99 mg/dL  GLUCOSE, CAPILLARY     Status: Abnormal   Collection Time    07/22/13  7:30 AM      Result Value Ref Range   Glucose-Capillary 125 (*) 70 - 99 mg/dL   Comment 1 Notify RN    GLUCOSE, CAPILLARY     Status: Abnormal   Collection Time     07/22/13 11:20 AM      Result Value Ref Range   Glucose-Capillary 172 (*) 70 - 99 mg/dL   Comment 1 Notify RN    GLUCOSE, CAPILLARY     Status: Abnormal   Collection Time    07/22/13  4:38 PM      Result Value Ref Range   Glucose-Capillary 146 (*) 70 - 99 mg/dL  GLUCOSE, CAPILLARY     Status: Abnormal   Collection Time    07/22/13  9:26 PM      Result Value Ref Range   Glucose-Capillary 207 (*) 70 - 99 mg/dL  GLUCOSE, CAPILLARY     Status: Abnormal   Collection Time    07/23/13  7:20 AM      Result Value Ref Range   Glucose-Capillary 156 (*) 70 - 99 mg/dL   Comment 1 Notify RN        Nursing note and vitals reviewed.  Constitutional: She is oriented to person, place, and time. She appears well-developed and well-nourished. She is sleeping. She is easily aroused.  HENT:  Head: Normocephalic and atraumatic.  Eyes: Conjunctivae are normal. Pupils are equal, round, and reactive to light.  Neck: Normal range of motion. Neck supple.  Cardiovascular: Normal rate and regular rhythm.  Respiratory: Effort normal and breath sounds normal. No respiratory distress. She has no wheezes.  GI: Soft. Bowel sounds are normal. She exhibits no distension. There is no tenderness.  Musculoskeletal: She exhibits no edema and no tenderness.  Neurological: She is oriented to person, place, and time and easily aroused.  Speech clear. Follows commands without difficulty. Diffuse weakness--BUE/LLE>RLE. Bilateral hands with thenar atrophy and contracture with decrease in fine motor movements. Numbness stocking-glove distribution persists Skin: Skin is warm and dry.  Mid thoracic back incision intact.  4 minus bilateral deltoid, bicep, tricep 3 minus bilateral finger flexors and extensors  2  minus bilateral hip flexors 3 minus bilaterally knee extensors 2 minus bilateral ankle dorsiflexors and plantar flexors   Assessment/Plan: 1. Functional deficits secondary to T11 lesion with paraplegia which  require 3+ hours per day of interdisciplinary therapy in a comprehensive inpatient rehab setting. Physiatrist is providing close team supervision and 24 hour management of active medical problems listed below. Physiatrist and rehab team continue to assess barriers to discharge/monitor patient progress toward functional and medical goals. FIM: FIM - Bathing Bathing Steps Patient Completed: Chest;Right Arm;Left Arm;Abdomen;Right upper leg;Left upper leg Bathing: 3: Mod-Patient completes 5-7 16f10 parts or 50-74%  FIM - Upper Body Dressing/Undressing Upper body dressing/undressing steps patient completed: Thread/unthread right sleeve of pullover shirt/dresss;Thread/unthread left sleeve of pullover shirt/dress;Put head through opening of pull over shirt/dress Upper body dressing/undressing: 4: Min-Patient completed 75 plus % of tasks FIM - Lower Body Dressing/Undressing Lower body dressing/undressing: 1: Total-Patient completed less than 25% of tasks  FIM - Toileting Toileting steps completed by patient: Adjust clothing prior to toileting;Performs perineal hygiene;Adjust clothing after toileting Toileting: 0: No continent bowel/bladder events this shift  FIM - TRadio producerDevices: Grab bars Toilet Transfers: 2-To toilet/BSC: Max A (lift and lower assist);2-From toilet/BSC: Max A (lift and lower assist)  FIM - BEngineer, siteAssistive Devices: Bed rails;Arm rests;HOB elevated Bed/Chair Transfer: 3: Supine > Sit: Mod A (lifting assist/Pt. 50-74%/lift 2 legs;3: Bed > Chair or W/C: Mod A (lift or lower assist)  FIM - Locomotion: Wheelchair Distance: 60 Locomotion: Wheelchair: 1: Total Assistance/staff pushes wheelchair (Pt<25%) FIM - Locomotion: Ambulation Locomotion: Ambulation Assistive Devices: Parallel bars Ambulation/Gait Assistance: 2: Max assist Locomotion: Ambulation: 1: Travels less than 50 ft with maximal assistance (Pt: 25 -  49%)  Comprehension Comprehension Mode: Auditory Comprehension: 5-Understands basic 90% of the time/requires cueing < 10% of the time  Expression Expression Mode: Verbal Expression: 5-Expresses basic needs/ideas: With extra time/assistive device  Social Interaction Social Interaction: 6-Interacts appropriately with others with medication or extra time (anti-anxiety, antidepressant).  Problem Solving Problem Solving: 4-Solves basic 75 - 89% of the time/requires cueing 10 - 24% of the time  Memory Memory: 3-Recognizes or recalls 50 - 74% of the time/requires cueing 25 - 49% of the time  Medical Problem List and Plan:  1. 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  2. DVT Prophylaxis/Anticoagulation: Mechanical: Antiembolism stockings, thigh (TED hose) Bilateral lower extremities  Sequential compression devices, below knee Bilateral lower extremities  3. Pain Management: Will continue vicodin prn as this is effective. Adjust dose 4. H/o depression/Mood: Resume lexapro. LCSW to follow for evaluation and support.  5. Neuropsych: This patient is capable of making decisions on her own behalf.  6. ESRD: Continue HD on TTS past therapy sessions.  7. DM type 2 with peripheral neuropathy: Will monitor BS with ac/hs checks. Continue lantus insulin (increase to 30u) with SSI for elevated BS. Anticipate that BS may run high while on decadron.  8. CAD: Continue plavix, lipitor, lopressor and ranexa.  9. Leucocytosis: Monitor for signs of infection with close watch on temp curve as well as wound.  10. HTN: increased norvasc to 518m Consider clonidine patch. Prn clonidine in place.  11. Acute on chronic anemia: On aranesp. Transfusion with dialysis prn.    LOS (Days) 4 A FACE TO FACE EVALUATION WAS PERFORMED  SWARTZ,ZACHARY T 07/23/2013, 8:59 AM

## 2013-07-23 NOTE — Progress Notes (Signed)
Occupational Therapy Session Note  Patient Details  Name: Holly Ellison MRN: [id number] Date of Birth: 05-27-1940  Today's Date: 07/23/2013 Time: 0800-0900 Time Calculation (min): 60 min  Short Term Goals: Week 1:  OT Short Term Goal 1 (Week 1): Pt. will feed self with AE prn with no assist OT Short Term Goal 2 (Week 1): Pt. will bath LB with AE with mod assist OT Short Term Goal 3 (Week 1): pt. will stand for LB bathing with mod assist OT Short Term Goal 4 (Week 1): Pt. will transfer to dropped arm cc with mod assist  Skilled Therapeutic Interventions/Progress Updates:    Pt engaged in bathing at shower level and dressing with sit<>stand from w/c at sink to address activity tolerance, safety awareness, sit<>stand, standing balance, and transfers.  Pt required mod A for stand pivot transfer bed->w/c and w/c<>tub bench.  Pt stood in shower with min A but required assistance with bathing buttocks. Pt required extra time to complete tasks as well as multiple rest breaks throughout session.   Therapy Documentation Precautions:  Precautions Precautions: Back;Fall Precaution Comments: Fistula L arm Required Braces or Orthoses: Spinal Brace Restrictions Weight Bearing Restrictions: No Pain: Pain Assessment Pain Assessment: 0-10 Pain Score: 4  Pain Type: Acute pain Pain Location: Back Pain Orientation: Lower Pain Descriptors / Indicators: Aching Pain Onset: On-going Pain Intervention(s): RN aware; Other (Comment) (LSO donned)  See FIM for current functional status  Therapy/Group: Individual Therapy  Leroy Libman 07/23/2013, 9:05 AM

## 2013-07-23 NOTE — Progress Notes (Signed)
Subjective:  Improving with rehab, no complaints  Objective: Vital signs in last 24 hours: Temp:  [97.6 F (36.4 C)-97.9 F (36.6 C)] 97.6 F (36.4 C) (06/16 0531) Pulse Rate:  [52-75] 54 (06/16 0902) Resp:  [18-20] 18 (06/16 0531) BP: (148-219)/(68-76) 148/72 mmHg (06/16 0902) SpO2:  [96 %-99 %] 99 % (06/16 0531) Weight:  [79.9 kg (176 lb 2.4 oz)] 79.9 kg (176 lb 2.4 oz) (06/16 0500) Weight change: 3.9 kg (8 lb 9.6 oz)  Intake/Output from previous day: 06/15 0701 - 06/16 0700 In: 120 [P.O.:120] Out: -  Intake/Output this shift:   Lab Results:  Recent Labs  07/20/13 1850  WBC 13.9*  HGB 7.1*  HCT 21.4*  PLT 349   BMET:  Recent Labs  07/20/13 1850  NA 129*  K 4.0  CL 87*  CO2 25  GLUCOSE 310*  BUN 57*  CREATININE 5.14*  CALCIUM 9.7  ALBUMIN 2.8*   No results found for this basename: PTH,  in the last 72 hours Iron Studies: No results found for this basename: IRON, TIBC, TRANSFERRIN, FERRITIN,  in the last 72 hours  Studies/Results: No results found.  EXAM: General appearance:  Alert, in no apparent distress Resp:  CTA without rales, rhonchi, or wheezes Cardio:  RRR without murmur or rub GI: + BS, soft and nontender Extremities:  No edema  Access:  AVF @ LUA with + bruit  HD: [facility]  4h 2/2.25 Bath 73kg LUA AVF Heparin 4500  Aranesp 25 mcg q 4 weeks, last 6/9 Hectorol 4 ug TIW  Tsat 26%, ferr 1119, pth 286  Assessment/Plan: 1. 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 2. ESRD - HD on TTS @ [facility], K 4.  HD pending. 3. HTN/Volume - BP 148/72 on Amlodipine 5 mg qd, Metoprolol 50 mg bid; 7 over EDW, but no signs or symptoms of fluid overload. 4. Anemia - Hgb 7.1,  Aranesp 60 mcg 5. Sec HPT - Ca 9.7 (10.7 corrected), P 2.8; Hectorol 4 mcg, Tums with meals.  Hold Hectorol. 6. Nutrition - Alb down to 2.8, renal  carb-mod diet, vitamin. 7. CAD - 7 stents, CABG, on Ranexa. 8. DM - insulin per primary.  LOS: 4 days   Holly Ellison,Holly Ellison 07/23/2013,10:21 AM  Pt seen, examined and agree w A/P as above.  [name] MD pager [phone]    cell [phone] 07/23/2013, 11:08 AM

## 2013-07-24 ENCOUNTER — Inpatient Hospital Stay (HOSPITAL_COMMUNITY): Payer: Medicare Other

## 2013-07-24 ENCOUNTER — Encounter (HOSPITAL_COMMUNITY): Payer: Medicare Other

## 2013-07-24 ENCOUNTER — Inpatient Hospital Stay (HOSPITAL_COMMUNITY): Payer: Medicare Other | Admitting: *Deleted

## 2013-07-24 DIAGNOSIS — G8220 Paraplegia, unspecified: Secondary | ICD-10-CM

## 2013-07-24 LAB — GLUCOSE, CAPILLARY
Glucose-Capillary: 84 mg/dL (ref 70–99)
Glucose-Capillary: 99 mg/dL (ref 70–99)
Glucose-Capillary: 75 mg/dL (ref 70–99)
Glucose-Capillary: 82 mg/dL (ref 70–99)
Glucose-Capillary: 123 mg/dL — ABNORMAL HIGH (ref 70–99)

## 2013-07-24 MED ORDER — HYDROCODONE-ACETAMINOPHEN 7.5-325 MG PO TABS
1.0000 | ORAL_TABLET | Freq: Four times a day (QID) | ORAL | Status: DC | PRN
  Administered 2013-07-25 – 2013-07-26 (×4): 1 via ORAL
  Filled 2013-07-24 (×4): qty 1

## 2013-07-24 NOTE — Progress Notes (Signed)
Physical Therapy Session Note  Patient Details  Name: Holly Ellison MRN: [id number] Date of Birth: 13-Apr-1940  Today's Date: 07/24/2013 Time: Treatment Session 1: [id number]; Treatment Session 2: 0092-3300 Time Calculation (min): Treatment Session 1: 45 min; Treatment Session 2: 12min  Short Term Goals: Week 1:  PT Short Term Goal 1 (Week 1): Pt will be able to complete supine to sit/sit to supine transfers with mod A to promote OOB activities PT Short Term Goal 2 (Week 1): Pt will be able to complete sit to stand/stand to sit transfers with min A.  PT Short Term Goal 3 (Week 1): Pt will be able to tolerate static standing for 60-75 seconds to promote increased weight bearing and pre-gait activities without knee buckling.  PT Short Term Goal 4 (Week 1): Pt will be able to ambulate 10-15 feet using the LRAD with mod A to promote increased functional mobility within the home.  PT Short Term Goal 5 (Week 1): Pt will be able to propel wheel chair 50 feet with SBA to promote functional mobility within the home and community.  Skilled Therapeutic Interventions/Progress Updates:  1:1. Pt received supine in bed, ready for therapy. Focus this session on intellectual awareness, B LE NMR, and safety during functional mobility. Pt req min A for t/f sup>sit EOB w/ use of bed rails, initiating use of log roll technique w/out cues and mod A for squat pivot t/f bed<>w/c<>tx mat. Max A for w/c propulsion x75' w/ B LE and therapist facilitating knee extension. Throughout remainder of session, pt became increasingly frustrated regarding lack of ability to do things (such as propel w/c) and increased cues for sequencing. Pt stating, "I don't understand, I can't feel my feet its like they aren't even there. I used to be able to do all this before." Education regarding surgery and PLOF vs. Current level of function w/ minimal understanding. Pt became emotional with emotional support provided. Pt progressively becoming more  quiet, with decreased focused attention and alertness noted. BP: 115/64. Pt transported back to room and assisted back to bed. RN present to assess vitals and blood sugar, WFL. Pt req max cues to wake, but immediately shutting eyes again. Pt left in care of RN.   Treatment Session 2:  1:1. Pt received semi-reclined in bed, demonstrating significantly improved alertness this session compared to earlier. Reviewed event from AM as well as re-educated pt regarding current level of function, role of therapies and PT goals. Pt verbalized understanding. Pt requested to use bathroom, req min A for t/f sup>sit EOB initiating use of log roll technique w/out cues, mod A for squat pivot t/f bed>w/c>BSC. Total A for management of clothing and close(S)-min A for stability while sitting on BSC. Pt requesting increased time to use bathroom, nurse tech present to take over.   Therapy Documentation Precautions:  Precautions Precautions: Back;Fall Precaution Comments: Fistula L arm Required Braces or Orthoses: Spinal Brace Restrictions Weight Bearing Restrictions: No General: Amount of Missed PT Time (min): 15 Minutes Missed Time Reason: Nursing care;Patient fatigue  See FIM for current functional status  Therapy/Group: Individual Therapy  Gilmore Laroche 07/24/2013, 12:11 PM

## 2013-07-24 NOTE — Progress Notes (Signed)
Patient ID: Holly Ellison, female   DOB: 12-04-1940, 73 y.o.   MRN: [id number] 73 y.o. female with history of DM type 2 with neuropathy, CAD, COPD, ESRD, HA, who was admitted on 07/16/13 pm with one week history of BLE, fecal incontinence and severe back pain. 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 Vertell Limber. Frozen section appeared benign and NS questions old or indolent osteomyelitis. Patient with history of MSSA bacteremia 07/2012 s/p 4 weeks of antibiotic therapy. CT chest without evidence of malignancy. HD ongoing and labs with evidence of ABLA with down to 7.1 as well as leucocytosis with WBC-16.0.    Subjective/Complaints: No new problems overnight. Had HD. Pain better. bp's better    Review of Systems  Musculoskeletal: Positive for back pain.  All other systems reviewed and are negative.  Objective: Vital Signs: Blood pressure 113/58, pulse 72, temperature 98.1 F (36.7 C), temperature source Oral, resp. rate 18, height 5' 6.14" (1.68 m), weight 73.664 kg (162 lb 6.4 oz), SpO2 100.00%. No results found. Results for orders placed during the hospital encounter of 07/19/13 (from the past 72 hour(s))  GLUCOSE, CAPILLARY     Status: Abnormal   Collection Time    07/21/13 11:25 AM      Result Value Ref Range   Glucose-Capillary 215 (*) 70 - 99 mg/dL   Comment 1 Notify RN    GLUCOSE, CAPILLARY     Status: Abnormal   Collection Time    07/21/13  4:19 PM      Result Value Ref Range   Glucose-Capillary 200 (*) 70 - 99 mg/dL   Comment 1 Notify RN    GLUCOSE, CAPILLARY     Status: Abnormal   Collection Time    07/21/13  9:29 PM      Result Value Ref Range   Glucose-Capillary 210 (*) 70 - 99 mg/dL  GLUCOSE, CAPILLARY     Status: Abnormal   Collection Time    07/22/13  7:30 AM      Result Value Ref Range   Glucose-Capillary  125 (*) 70 - 99 mg/dL   Comment 1 Notify RN    GLUCOSE, CAPILLARY     Status: Abnormal   Collection Time    07/22/13 11:20 AM      Result Value Ref Range   Glucose-Capillary 172 (*) 70 - 99 mg/dL   Comment 1 Notify RN    GLUCOSE, CAPILLARY     Status: Abnormal   Collection Time    07/22/13  4:38 PM      Result Value Ref Range   Glucose-Capillary 146 (*) 70 - 99 mg/dL  GLUCOSE, CAPILLARY     Status: Abnormal   Collection Time    07/22/13  9:26 PM      Result Value Ref Range   Glucose-Capillary 207 (*) 70 - 99 mg/dL  GLUCOSE, CAPILLARY     Status: Abnormal   Collection Time    07/23/13  7:20 AM      Result Value Ref Range   Glucose-Capillary 156 (*) 70 - 99 mg/dL   Comment 1 Notify RN    GLUCOSE, CAPILLARY     Status: Abnormal   Collection Time    07/23/13 11:22 AM      Result Value Ref Range   Glucose-Capillary 159 (*) 70 - 99 mg/dL   Comment 1 Notify RN  GLUCOSE, CAPILLARY     Status: Abnormal   Collection Time    07/23/13  4:49 PM      Result Value Ref Range   Glucose-Capillary 148 (*) 70 - 99 mg/dL  GLUCOSE, CAPILLARY     Status: Abnormal   Collection Time    07/23/13  9:47 PM      Result Value Ref Range   Glucose-Capillary 108 (*) 70 - 99 mg/dL  GLUCOSE, CAPILLARY     Status: None   Collection Time    07/24/13  7:18 AM      Result Value Ref Range   Glucose-Capillary 84  70 - 99 mg/dL   Comment 1 Notify RN        Nursing note and vitals reviewed.  Constitutional: She is oriented to person, place, and time. She appears well-developed and well-nourished. She is sleeping. She is easily aroused.  HENT:  Head: Normocephalic and atraumatic.  Eyes: Conjunctivae are normal. Pupils are equal, round, and reactive to light.  Neck: Normal range of motion. Neck supple.  Cardiovascular: Normal rate and regular rhythm.  Respiratory: Effort normal and breath sounds normal. No respiratory distress. She has no wheezes.  GI: Soft. Bowel sounds are normal. She exhibits no  distension. There is no tenderness.  Musculoskeletal: She exhibits no edema and no tenderness.  Neurological: She is oriented to person, place, and time and easily aroused.  Speech clear. Follows commands without difficulty. Diffuse weakness--BUE/LLE>RLE. Bilateral hands with thenar atrophy and contracture with decrease in fine motor movements. Numbness stocking-glove distribution persists Skin: Skin is warm and dry.  Mid thoracic back incision intact.  4 minus bilateral deltoid, bicep, tricep 3 minus bilateral finger flexors and extensors  2 minus bilateral hip flexors 3 minus bilaterally knee extensors 2 minus bilateral ankle dorsiflexors and plantar flexors   Assessment/Plan: 1. Functional deficits secondary to T11 lesion with paraplegia which require 3+ hours per day of interdisciplinary therapy in a comprehensive inpatient rehab setting. Physiatrist is providing close team supervision and 24 hour management of active medical problems listed below. Physiatrist and rehab team continue to assess barriers to discharge/monitor patient progress toward functional and medical goals. FIM: FIM - Bathing Bathing Steps Patient Completed: Chest;Right Arm;Left Arm;Abdomen;Front perineal area;Right upper leg;Left upper leg Bathing: 3: Mod-Patient completes 5-7 42f 10 parts or 50-74%  FIM - Upper Body Dressing/Undressing Upper body dressing/undressing steps patient completed: Thread/unthread right sleeve of pullover shirt/dresss;Thread/unthread left sleeve of pullover shirt/dress;Put head through opening of pull over shirt/dress;Pull shirt over trunk Upper body dressing/undressing: 5: Set-up assist to: Obtain clothing/put away FIM - Lower Body Dressing/Undressing Lower body dressing/undressing steps patient completed: Thread/unthread left underwear leg;Thread/unthread left pants leg Lower body dressing/undressing: 2: Max-Patient completed 25-49% of tasks  FIM - Toileting Toileting steps completed by  patient: Adjust clothing prior to toileting;Performs perineal hygiene;Adjust clothing after toileting Toileting: 0: No continent bowel/bladder events this shift  FIM - Radio producer Devices: Grab bars Toilet Transfers: 2-To toilet/BSC: Max A (lift and lower assist);2-From toilet/BSC: Max A (lift and lower assist)  FIM - Control and instrumentation engineer Devices: Arm rests Bed/Chair Transfer: 3: Chair or W/C > Bed: Mod A (lift or lower assist);3: Bed > Chair or W/C: Mod A (lift or lower assist)  FIM - Locomotion: Wheelchair Distance: 60 Locomotion: Wheelchair: 1: Total Assistance/staff pushes wheelchair (Pt<25%) FIM - Locomotion: Ambulation Locomotion: Ambulation Assistive Devices: Parallel bars Ambulation/Gait Assistance: 3: Mod assist Locomotion: Ambulation: 0: Activity did not occur  Comprehension  Comprehension Mode: Auditory Comprehension: 5-Understands basic 90% of the time/requires cueing < 10% of the time  Expression Expression Mode: Verbal Expression: 5-Expresses basic needs/ideas: With no assist  Social Interaction Social Interaction: 6-Interacts appropriately with others with medication or extra time (anti-anxiety, antidepressant).  Problem Solving Problem Solving: 4-Solves basic 75 - 89% of the time/requires cueing 10 - 24% of the time  Memory Memory: 3-Recognizes or recalls 50 - 74% of the time/requires cueing 25 - 49% of the time  Medical Problem List and Plan:  1. 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  2. DVT Prophylaxis/Anticoagulation: Mechanical: Antiembolism stockings, thigh (TED hose) Bilateral lower extremities  Sequential compression devices, below knee Bilateral lower extremities  3. Pain Management: Will continue vicodin prn as this is effective. Adjust dose 4. H/o depression/Mood: Resume lexapro. LCSW to follow for  evaluation and support.  5. Neuropsych: This patient is capable of making decisions on her own behalf.  6. ESRD: Continue HD on TTS past therapy sessions.  7. DM type 2 with peripheral neuropathy: Will monitor BS with ac/hs checks. Continue lantus insulin (increase to 30u) with SSI for elevated BS. Sugars improving---watch for hypoglycemia    8. CAD: Continue plavix, lipitor, lopressor and ranexa.  9. Leucocytosis: Monitor for signs of infection with close watch on temp curve as well as wound.  10. HTN: increased norvasc to 5mg  daily. bp's leveled out yesterday (low with hd)   -f needed Consider clonidine patch. Prn po clonidine in place.  11. Acute on chronic anemia: On aranesp. Transfusion with dialysis prn.    LOS (Days) 5 A FACE TO FACE EVALUATION WAS PERFORMED  SWARTZ,ZACHARY T 07/24/2013, 8:02 AM

## 2013-07-24 NOTE — Progress Notes (Signed)
Subjective:  Sluggish, complains of soreness over abdomen and lower back, but received Vicodin & Robaxin this AM  Objective: Vital signs in last 24 hours: Temp:  [97.3 F (36.3 C)-98.5 F (36.9 C)] 98.1 F (36.7 C) (06/17 0512) Pulse Rate:  [54-77] 72 (06/17 0742) Resp:  [18-20] 18 (06/17 0512) BP: (85-202)/(42-76) 113/58 mmHg (06/17 0742) SpO2:  [93 %-100 %] 98 % (06/17 0933) Weight:  [72.7 kg (160 lb 4.4 oz)-76.7 kg (169 lb 1.5 oz)] 73.664 kg (162 lb 6.4 oz) (06/17 0512) Weight change: -3.2 kg (-7 lb 0.9 oz)  Intake/Output from previous day: 06/16 0701 - 06/17 0700 In: 120 [P.O.:120] Out: 3637  Intake/Output this shift: Total I/O In: 240 [P.O.:240] Out: -   Lab Results: No results found for this basename: WBC, HGB, HCT, PLT,  in the last 72 hours BMET: No results found for this basename: NA, K, CL, CO2, GLUCOSE, BUN, CREATININE, CALCIUM, PHOSPHORUS, ALBUMIN,  in the last 72 hours No results found for this basename: PTH,  in the last 72 hours Iron Studies: No results found for this basename: IRON, TIBC, TRANSFERRIN, FERRITIN,  in the last 72 hours  Studies/Results: No results found.  EXAM:  General appearance: Alert, in no apparent distress  Resp: CTA without rales, rhonchi, or wheezes  Cardio: RRR without murmur or rub  GI: + BS, soft and nontender  Extremities: No edema  Access: AVF @ LUA with + bruit   HD: [facility]  4h 2/2.25 Bath 73kg LUA AVF Heparin 4500  Aranesp 25 mcg q 4 weeks, last 6/9 Hectorol 4 ug TIW  Tsat 26%, ferr 1119, pth 286  Assessment/Plan: 1. 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  2. ESRD - HD on TTS @ [facility].  Next HD tomorrow. 3. HTN/Volume - BP 113/58 on Amlodipine 5 mg qd, Metoprolol 50 mg bid; wt 73.7 kg s/p net UF 3.6 L yesterday.  4. Anemia - Hgb 7.1, Aranesp 60 mcg. CBC tomorrow. 5. Sec HPT - Ca 9.7  (10.7 corrected), P 2.8; Hectorol 4 mcg, Tums with meals. Hold Hectorol.  6. Nutrition - Alb down to 2.8, renal carb-mod diet, vitamin.  7. CAD - 7 stents, CABG, on Ranexa.  8. DM - insulin per primary.     LOS: 5 days   Holly Ellison,Holly Ellison 07/24/2013,10:29 AM  Pt seen, examined and agree w A/P as above.  [name] MD pager [phone]    cell [phone] 07/24/2013, 12:00 PM

## 2013-07-24 NOTE — Patient Care Conference (Signed)
Inpatient RehabilitationTeam Conference and Plan of Care Update Date: 07/23/2013   Time: 3:30 PM    Patient Name: Holly Ellison      Medical Record Number: [id number]  Date of Birth: [birth year] Sex: Female         Room/Bed: 4M11C/4M11C-01 Payor Info: Payor: MEDICARE / Plan: MEDICARE PART A AND B / Product Type: *No Product type* /    Admitting Diagnosis: T11 sp cord decomp   Admit Date/Time:  07/19/2013  5:07 PM Admission Comments: No comment available   Primary Diagnosis:  <principal problem not specified> Principal Problem: <principal problem not specified>  Patient Active Problem List   Diagnosis Date Noted  . Compression of spinal cord with myelopathy 07/19/2013  . Paraparesis 07/17/2013  . Bacteremia due to Staphylococcus aureus 07/28/2012  . HCAP (healthcare-associated pneumonia) 07/27/2012  . ESRD on dialysis 07/27/2012  . Chest pain 10/29/2011  . Hypertensive emergency 10/29/2011  . Bell's palsy 10/29/2011  . Hyperthyroidism 10/29/2011  . Pulmonary edema 06/05/2011  . 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 Peripheral neuropathy 06/05/2011  . Diabetes mellitus 06/05/2011  . Hypertension 06/05/2011  . COPD (chronic obstructive pulmonary disease) 06/05/2011  . Atherosclerosis of native arteries of the extremities with ulceration 05/25/2011    Expected Discharge Date: Expected Discharge Date: 08/08/13  Team Members Present: Physician leading conference: Dr. 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 LBM 6/11/5  Pt to remain continent.  Monitor for regular bowel movement q 2-3 days   Swallow/Nutrition/ Hydration             ADL's   bathing/dressing-mod A; transfers-mod A/max A; decreased activity tolerance/BUE strength  bathing/dressing-supervision; toilet transfer-suprevision; tub transfer-min A  activity tolerance, transfers, UE strength, safety awareness   Mobility   Close(S)-min guard A for sitting balance; Max A for bed mobility and w/c propulsion, Mod A for squat pivot transfers and ambulation x10'i n // bars  Supervision-Min A, combination of w/c and ambulation  functional endurance, B LE strength, safety during functional mobility, transfers, bed mobility, ambulation, pt education   Communication             Safety/Cognition/ Behavioral Observations            Pain   No c/o pain  <3  Monitor for nonverbal cues    Skin   Hemodialysis cath LUE ++, Honeycomb dressing to surgical incision, unremarkable,   No additional skin breakdown  Routine turn q 2hrs    Rehab Goals Patient on target to meet rehab goals: Yes *See Care Plan and progress notes for long and short-term goals.  Barriers to Discharge: NEURO DEFICITS, PAIN    Possible Resolutions to Barriers:  NMR, ADAPTIVE EQUIPMENT, SCI EDUCATION    Discharge Planning/Teaching Needs:  home with son and daughter providing 24/7 assistance      Team Discussion:  Pt very motivated, but extremely deconditioned and weak.  Anticipate need for longer LOS to meet supervision/ min assist goals.  No concerns at this time.  Revisions to Treatment Plan:  None  Continued Need for Acute Rehabilitation Level of Care: The patient requires daily medical management by a physician with specialized training in physical medicine and rehabilitation for the following conditions: Daily direction of a multidisciplinary physical rehabilitation program to ensure safe treatment while eliciting the highest outcome that is of  practical value to the patient.: Yes Daily medical management of patient stability for increased activity during participation in an intensive rehabilitation regime.: Yes Daily analysis of laboratory values and/or radiology reports with any subsequent need for medication adjustment of medical intervention for : Neurological problems;Post surgical problems  HOYLE, LUCY 07/24/2013, 7:28 AM

## 2013-07-24 NOTE — Progress Notes (Signed)
Occupational Therapy Session Note  Patient Details  Name: Holly Ellison MRN: [id number] Date of Birth: 16-Mar-1940  Today's Date: 07/24/2013  Session 1 Time: 0800-0900 Time Calculation (min): 60 min  Short Term Goals: Week 1:  OT Short Term Goal 1 (Week 1): Pt. will feed self with AE prn with no assist OT Short Term Goal 2 (Week 1): Pt. will bath LB with AE with mod assist OT Short Term Goal 3 (Week 1): pt. will stand for LB bathing with mod assist OT Short Term Goal 4 (Week 1): Pt. will transfer to dropped arm cc with mod assist  Skilled Therapeutic Interventions/Progress Updates:    Pt engaged in bathing and dressing at bed level.  Pt initially set EOB but complained of increased pain in back and stated that she was unable to "sit up straight."  Attempted transfer to w/c to complete bathing and dressing tasks.  Pt stated that pain was too intense and she needed to lay back in bed.  Pt completed bathing and dressing tasks at bed level.  Pt required increased assistance this morning.  Pt exhibited difficulty sequencing tasks at bed level versus shower and seated in w/c.  RN notified to look at patient's back secondary to patient's report of increased pain with any transitional movements.  RN assessed patient's back and removed one strip of tape/bandage at top of surgical site. Pt reported that pain decreased with movements afterwards.  MD present during beginning of session and also made aware of increased pain. Focus on activity tolerance, BADLs, sequencing, and bed mobility.   Therapy Documentation Precautions:  Precautions Precautions: Back;Fall Precaution Comments: Fistula L arm Required Braces or Orthoses: Spinal Brace Restrictions Weight Bearing Restrictions: No Pain: Pain Assessment Pain Assessment: 0-10 Pain Score: 10-Worst pain ever Pain Type: Acute pain Pain Location: Back Pain Orientation: Mid Pain Descriptors / Indicators: Aching;Jabbing Pain Onset: With Activity Pain  Intervention(s): RN made aware (nurse called to room to assess dressing and notify of pain with movement);Repositioned  See FIM for current functional status  Therapy/Group: Individual Therapy  Session 2 Time: 1100-1140 Pt initially denied pain before moving but stated her pain increased to 5/10 in mid back with movement. Individual Therapy  Pt asleep upon arrival and required tactile cue to awaken.  Pt initially not oriented until fully awake.  Discussed with patient earlier events with increased pain during earlier session and her subsequent session with PT during which patient was confused. Pt stated that she "felt stupid" earlier.  Pt more alert during this session but still required max verbal cues for task initiation and sequencing. Focus of session on bed mobility using proper technique for back precautions and to reduce pain during transitional movements.  Leotis Shames [facility] 07/24/2013, 9:21 AM

## 2013-07-24 NOTE — Progress Notes (Signed)
PT notified RN of patient acting differently, saying that she was easily frustrated, fatigued.  Checked blood sugar- 99.  VSS BP 115/73, O2 sats 98% on room air.  Patient falling asleep easily during conversation.  Moans and groans when moved or awakened.  C/o pain in back.  Patient resting in bed at this time.  Will notify Algis Liming, PA of findings.  Will continue to monitor patient closely. Brita Romp, RN

## 2013-07-24 NOTE — Progress Notes (Signed)
Social Work Patient ID: Holly Ellison, female   DOB: Jun 30, 1940, 73 y.o.   MRN: [id number]  Lennart Pall, LCSW Social Worker Signed  Patient Care Conference Service date: 07/24/2013 7:28 AM  Inpatient RehabilitationTeam Conference and Plan of Care Update Date: 07/23/2013   Time: 3:30 PM     Patient Name: Holly Ellison       Medical Record Number: [id number]   Date of Birth: December 31, 1940 Sex: Female         Room/Bed: 4M11C/4M11C-01 Payor Info: Payor: MEDICARE / Plan: MEDICARE PART A AND B / Product Type: *No Product type* /   Admitting Diagnosis: T11 sp cord decomp   Admit Date/Time:  07/19/2013  5:07 PM Admission Comments: No comment available   Primary Diagnosis:  <principal problem not specified> Principal Problem: <principal problem not specified>    Patient Active Problem List     Diagnosis  Date Noted   .  Compression of spinal cord with myelopathy  07/19/2013   .  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  Bacteremia due to Staphylococcus aureus  07/28/2012   .  HCAP (healthcare-associated pneumonia)  07/27/2012   .  ESRD on dialysis  07/27/2012   .  Chest pain  10/29/2011   .  Hypertensive emergency  10/29/2011   .  Bell's palsy  10/29/2011   .  Hyperthyroidism  10/29/2011   .  Pulmonary edema  06/05/2011   .  CAD (coronary artery disease) of bypass graft  06/05/2011   .  Peripheral neuropathy  06/05/2011   .  Diabetes mellitus  06/05/2011   .  Hypertension  06/05/2011   .  COPD (chronic obstructive pulmonary disease)  06/05/2011   .  Atherosclerosis of native arteries of the extremities with ulceration  05/25/2011     Expected Discharge Date: Expected Discharge Date: 08/08/13  Team Members Present: Physician leading conference: Dr. Alger Simons Social Worker Present: Lennart Pall, LCSW Nurse Present: Elliot Cousin, RN PT Present: Melene Plan, Cottie Banda, PT OT Present: Roanna Epley, COTA;Kayla Perkinson, OT;Jennifer Tamala Julian, OT SLP Present: Weston Anna, SLP PPS Coordinator  present : Daiva Nakayama, RN, CRRN;Becky Alwyn Ren, PT        Current Status/Progress  Goal  Weekly Team Focus   Medical     T11 DESTRUCTIVE LESION (?OSTEO) WITH MYELOPATHY  IMPROVE FUNCTIONAL MOBILITY  PAIN CONTROL, nmr   Bowel/Bladder     Continent of bowel and bladder. LBM 6/11/5  Pt to remain continent.  Monitor for regular bowel movement q 2-3 days   Swallow/Nutrition/ Hydration            ADL's     bathing/dressing-mod A; transfers-mod A/max A; decreased activity tolerance/BUE strength  bathing/dressing-supervision; toilet transfer-suprevision; tub transfer-min A  activity tolerance, transfers, UE strength, safety awareness   Mobility     Close(S)-min guard A for sitting balance; Max A for bed mobility and w/c propulsion, Mod A for squat pivot transfers and ambulation x10'i n // bars  Supervision-Min A, combination of w/c and ambulation  functional endurance, B LE strength, safety during functional mobility, transfers, bed mobility, ambulation, pt education   Communication            Safety/Cognition/ Behavioral Observations           Pain     No c/o pain  <3  Monitor for nonverbal cues    Skin     Hemodialysis cath LUE ++, Honeycomb dressing to surgical incision, unremarkable,   No additional skin breakdown  Routine turn q 2hrs    Rehab Goals Patient on target to meet rehab goals: Yes *See Care Plan and progress notes for long and short-term goals.    Barriers to Discharge:  NEURO DEFICITS, PAIN      Possible Resolutions to Barriers:    NMR, ADAPTIVE EQUIPMENT, SCI EDUCATION      Discharge Planning/Teaching Needs:    home with son and daughter providing 24/7 assistance      Team Discussion:    Pt very motivated, but extremely deconditioned and weak.  Anticipate need for longer LOS to meet supervision/ min assist goals.  No concerns at this time.   Revisions to Treatment Plan:    None    Continued Need for Acute Rehabilitation Level of Care: The patient requires daily  medical management by a physician with specialized training in physical medicine and rehabilitation for the following conditions: Daily direction of a multidisciplinary physical rehabilitation program to ensure safe treatment while eliciting the highest outcome that is of practical value to the patient.: Yes Daily medical management of patient stability for increased activity during participation in an intensive rehabilitation regime.: Yes Daily analysis of laboratory values and/or radiology reports with any subsequent need for medication adjustment of medical intervention for : Neurological problems;Post surgical problems  Holly Ellison 07/24/2013, 7:28 AM

## 2013-07-24 NOTE — Progress Notes (Signed)
Recreational Therapy Session Note  Patient Details  Name: Holly Ellison MRN: [id number] Date of Birth: 1940/09/01 Today's Date: 07/24/2013  Eval  Incomplete, pt lethargic, resting in bed.  RN aware & monitoring.   [place] 07/24/2013, 12:19 PM

## 2013-07-24 NOTE — Progress Notes (Signed)
Patient sleepy and lethargic--got 2 vicodin as well as robaxin this am. Had dialysis last pm. Fatigue compounded by pain meds. Will let patient rest this am and monitor activity level this afternoon. Will decrease vicodin to one pill every 6 hours prn.

## 2013-07-24 NOTE — Progress Notes (Signed)
Social Work Patient ID: Holly Ellison, female   DOB: December 14, 1940, 73 y.o.   MRN: [id number]   Met with pt and her son yesterday following team conference.  Both aware and agreeable with targeted d/c date of 7/2 and supervision/ min assist goals.  Pt does ask if she may need to "go somewhere else" after CIR.  Clarified that she does have option of SNF after CIR if she and family do not feel she is ready for home.  Son encourages her to "Don't think to much ahead ... Let's see how you do."  Son confirms that family can provide 24/7 care at d/c.  Will continue to follow.  HOYLE, LUCY, LCSW

## 2013-07-25 ENCOUNTER — Inpatient Hospital Stay (HOSPITAL_COMMUNITY): Payer: Medicare Other | Admitting: Physical Therapy

## 2013-07-25 ENCOUNTER — Encounter (HOSPITAL_COMMUNITY): Payer: Medicare Other

## 2013-07-25 ENCOUNTER — Inpatient Hospital Stay (HOSPITAL_COMMUNITY): Payer: Medicare Other | Admitting: *Deleted

## 2013-07-25 ENCOUNTER — Inpatient Hospital Stay (HOSPITAL_COMMUNITY): Payer: Medicare Other | Admitting: Occupational Therapy

## 2013-07-25 LAB — GLUCOSE, CAPILLARY
GLUCOSE-CAPILLARY: 112 mg/dL — AB (ref 70–99)
GLUCOSE-CAPILLARY: 59 mg/dL — AB (ref 70–99)
Glucose-Capillary: 64 mg/dL — ABNORMAL LOW (ref 70–99)
Glucose-Capillary: 72 mg/dL (ref 70–99)

## 2013-07-25 LAB — CBC
HCT: 20.6 % — ABNORMAL LOW (ref 36.0–46.0)
Hemoglobin: 7 g/dL — ABNORMAL LOW (ref 12.0–15.0)
MCH: 33.7 pg (ref 26.0–34.0)
MCHC: 34 g/dL (ref 30.0–36.0)
MCV: 99 fL (ref 78.0–100.0)
PLATELETS: 359 10*3/uL (ref 150–400)
RBC: 2.08 MIL/uL — ABNORMAL LOW (ref 3.87–5.11)
RDW: 16.7 % — AB (ref 11.5–15.5)
WBC: 13.7 10*3/uL — ABNORMAL HIGH (ref 4.0–10.5)

## 2013-07-25 LAB — RENAL FUNCTION PANEL
Albumin: 2.7 g/dL — ABNORMAL LOW (ref 3.5–5.2)
BUN: 50 mg/dL — ABNORMAL HIGH (ref 6–23)
CALCIUM: 9.6 mg/dL (ref 8.4–10.5)
CO2: 29 mEq/L (ref 19–32)
CREATININE: 4.89 mg/dL — AB (ref 0.50–1.10)
Chloride: 93 mEq/L — ABNORMAL LOW (ref 96–112)
GFR calc Af Amer: 9 mL/min — ABNORMAL LOW (ref >90)
GFR, EST NON AFRICAN AMERICAN: 8 mL/min — AB (ref >90)
Glucose, Bld: 68 mg/dL — ABNORMAL LOW (ref 70–99)
PHOSPHORUS: 3 mg/dL (ref 2.3–4.6)
Potassium: 4.5 mEq/L (ref 3.7–5.3)
Sodium: 134 mEq/L — ABNORMAL LOW (ref 137–147)

## 2013-07-25 MED ORDER — LIDOCAINE-PRILOCAINE 2.5-2.5 % EX CREA: 1.0000 "application " | TOPICAL_CREAM | CUTANEOUS | Status: DC | PRN

## 2013-07-25 MED ORDER — SODIUM CHLORIDE 0.9 % IV SOLN: 100.0000 mL | INTRAVENOUS | Status: DC | PRN

## 2013-07-25 MED ORDER — INSULIN GLARGINE 100 UNIT/ML ~~LOC~~ SOLN
25.0000 [IU] | Freq: Every day | SUBCUTANEOUS | Status: DC
  Filled 2013-07-25: qty 0.25

## 2013-07-25 MED ORDER — DARBEPOETIN ALFA-POLYSORBATE 60 MCG/0.3ML IJ SOLN
INTRAMUSCULAR | Status: AC
  Filled 2013-07-25: qty 0.3

## 2013-07-25 MED ORDER — PENTAFLUOROPROP-TETRAFLUOROETH EX AERO: 1.0000 "application " | INHALATION_SPRAY | CUTANEOUS | Status: DC | PRN

## 2013-07-25 MED ORDER — LIDOCAINE HCL (PF) 1 % IJ SOLN: 5.0000 mL | INTRAMUSCULAR | Status: DC | PRN

## 2013-07-25 MED ORDER — HEPARIN SODIUM (PORCINE) 1000 UNIT/ML DIALYSIS: 1000.0000 [IU] | INTRAMUSCULAR | Status: DC | PRN

## 2013-07-25 MED ORDER — ALTEPLASE 2 MG IJ SOLR
2.0000 mg | Freq: Once | INTRAMUSCULAR | Status: DC | PRN
  Filled 2013-07-25: qty 2

## 2013-07-25 MED ORDER — NEPRO/CARBSTEADY PO LIQD
237.0000 mL | ORAL | Status: DC | PRN
  Filled 2013-07-25: qty 237

## 2013-07-25 MED ORDER — BACLOFEN 5 MG HALF TABLET
5.0000 mg | ORAL_TABLET | Freq: Three times a day (TID) | ORAL | Status: DC | PRN
  Administered 2013-07-25 – 2013-07-26 (×2): 5 mg via ORAL
  Filled 2013-07-25 (×3): qty 1

## 2013-07-25 NOTE — Progress Notes (Signed)
Patient still c/o intense back pain.  PT in with patient attempting to get patient to agree to work with her.  Pulled back covers to apply k-pad and patient started moaning without me even touching her.  Applied k-pad to back and repositioned patient in bed for comfort.  Will continue to monitor patient closely.  Brita Romp, RN

## 2013-07-25 NOTE — Progress Notes (Signed)
Occupational Therapy Session Note  Patient Details  Name: Holly Ellison MRN: [id number] Date of Birth: [birth year]  Today's Date: 07/25/2013 Time: 0800-0830 Time Calculation (min): 30 min  Short Term Goals: Week 1:  OT Short Term Goal 1 (Week 1): Pt. will feed self with AE prn with no assist OT Short Term Goal 2 (Week 1): Pt. will bath LB with AE with mod assist OT Short Term Goal 3 (Week 1): pt. will stand for LB bathing with mod assist OT Short Term Goal 4 (Week 1): Pt. will transfer to dropped arm cc with mod assist  Skilled Therapeutic Interventions/Progress Updates:    Pt resting in bed eating Graham cracker upon arrival.  Pt state her breakfast tray had just been delivered.  Nurse tech entered to recheck CBG since it was at 64 earlier.  Recheck results at 21.  Pt agreeable to transferring to w/c to eat breakfast before bathing and dressing.  Pt sat EOB with mod A and performed squat pivot transfer to w/c with mod A.  Pt began eating breakfast but after about 5 mins c/o increased pain on left side of mid back. Pt began grimacing and became tearful.  RN notified and pt returned to bed for comfort.  Pt positioned on side with pillow at back for support.  Pt rated pain at 10.  Pt missed 30 mins therapy secondary to increased pain.  Focus on bed mobility, transfers, and activity tolerance.  Therapy Documentation Precautions:  Precautions Precautions: Back;Fall Precaution Comments: Fistula L arm Required Braces or Orthoses: Spinal Brace Restrictions Weight Bearing Restrictions: No General: General Amount of Missed OT Time (min): 30 Minutes pain Pain: Pain Assessment Pain Assessment: 0-10 Pain Score: 10-Worst pain ever Pain Location: Back Pain Orientation: Mid Pain Descriptors / Indicators: Aching;Spasm;Sharp Pain Onset: With Activity Pain Intervention(s): RN made aware;Repositioned  See FIM for current functional status  Therapy/Group: Individual Therapy  Leroy Libman 07/25/2013, 8:39 AM

## 2013-07-25 NOTE — Progress Notes (Signed)
CRITICAL VALUE ALERT  Critical value received:  CBG 64  Date of notification:  07/25/17  Time of notification:  0755  Critical value read back:yes  Nurse who received alert:  Rayetta Pigg, RN  MD notified (1st page):  Naaman Plummer  Time of first page:  0800  MD notified (2nd page):  Time of second page:  Responding MD:  Naaman Plummer  Time MD responded:  0800

## 2013-07-25 NOTE — Progress Notes (Signed)
Recreational Therapy Session Note  Patient Details  Name: Sande Pickert MRN: [id number] Date of Birth: [birth year] Today's Date: 07/25/2013  Pain: c/o back pain, unrated, RN aware, premedicated Skilled Therapeutic Interventions/Progress Updates: Pt in bed during leisure screen with frequent c/o pain, grimacing, moaning but wanted to engage in conversation.  Leisure discussion about interest PTA & education on deep breathing techniques to assist with pain management.  Education included verbal instructions, demonstration/modeling & actual implementation with max cues to focus on breathing.  Pt requested repositioning and to get OOB.  RN in to assist with scoot pivot transfer, Total assist +2, pt+~50.   Will defer full eval at this time, will continue to monitor through team for future participation. Therapy/Group: Individual Therapy  SIMPSON,LISA 07/25/2013, 1:51 PM

## 2013-07-25 NOTE — Progress Notes (Signed)
Occupational Therapy Session Note  Patient Details  Name: Marigrace Mccole MRN: [id number] Date of Birth: [birth year]  Today's Date: 07/25/2013 Time: 3545-6256 Time Calculation (min): 25 min  Short Term Goals: Week 1:  OT Short Term Goal 1 (Week 1): Pt. will feed self with AE prn with no assist OT Short Term Goal 2 (Week 1): Pt. will bath LB with AE with mod assist OT Short Term Goal 3 (Week 1): pt. will stand for LB bathing with mod assist OT Short Term Goal 4 (Week 1): Pt. will transfer to dropped arm cc with mod assist  Skilled Therapeutic Interventions/Progress Updates:    Patient seen this pm for OT intervention to attempt to address functional mobility as related to ADL.  Patient initially very receptive to OT although dozing upon arrival.  Patient requesting to brush teeth.  While shifting weight forward, patient cried out in pain, and reported that pain was "back again."  Patient with reported episode of back spasm in OT session this am.  Patient unable to continue session, and assisted back to bed with moist heat on back, patient placed on right side per her request.  RN aware, and attentive to patient's concerns regarding pain.    Therapy Documentation Precautions:  Precautions Precautions: Back;Fall Precaution Comments: Fistula L arm Required Braces or Orthoses: Spinal Brace Restrictions Weight Bearing Restrictions: No General: General Amount of Missed OT Time (min): 20 Minutes Missed Time Reason: Pain   Pain: 8/10 spasm in back and side See FIM for current functional status  Therapy/Group: Individual Therapy  Mariah Milling 07/25/2013, 1:56 PM

## 2013-07-25 NOTE — Progress Notes (Signed)
Subjective:  Back pain, likely muscle spasms, no other complaints  Objective: Vital signs in last 24 hours: Temp:  [97.1 F (36.2 C)-98.4 F (36.9 C)] 97.9 F (36.6 C) (06/18 0517) Pulse Rate:  [59-74] 65 (06/18 0517) Resp:  [17-18] 18 (06/18 0517) BP: (115-131)/(56-65) 125/61 mmHg (06/18 0517) SpO2:  [98 %] 98 % (06/18 0517) Weight:  [73.846 kg (162 lb 12.8 oz)] 73.846 kg (162 lb 12.8 oz) (06/18 0517) Weight change: -2.855 kg (-6 lb 4.7 oz)  Intake/Output from previous day: 06/17 0701 - 06/18 0700 In: 360 [P.O.:360] Out: -    Lab Results: No results found for this basename: WBC, HGB, HCT, PLT,  in the last 72 hours BMET:  Recent Labs  07/25/13 0610  NA 134*  K 4.5  CL 93*  CO2 29  GLUCOSE 68*  BUN 50*  CREATININE 4.89*  CALCIUM 9.6  ALBUMIN 2.7*   No results found for this basename: PTH,  in the last 72 hours Iron Studies: No results found for this basename: IRON, TIBC, TRANSFERRIN, FERRITIN,  in the last 72 hours  Studies/Results: No results found.  EXAM:  General appearance: Alert, in moderate distress, sec to back pain  Resp: CTA without rales, rhonchi, or wheezes  Cardio: RRR without murmur or rub  GI: + BS, soft and nontender  Extremities: No edema  Access: AVF @ LUA with + bruit   HD: [facility]  4h 2/2.25 Bath 73kg LUA AVF Heparin 4500  Aranesp 25 mcg q 4 weeks, last 6/9 Hectorol 4 ug TIW  Tsat 26%, ferr 1119, pth 286  Assessment/Plan: 1. 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  2. ESRD - HD on TTS @ [facility]. HD pending today.  3. HTN/Volume - BP 125/61 on Amlodipine 5 mg qd, Metoprolol 50 mg bid; wt 73.8 kg with HD pending.  4. Anemia - Hgb 7.1, Aranesp 60 mcg.  CBC pre-dialysis. 5. 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  6. Nutrition - Alb down to 2.7, renal carb-mod  diet, vitamin.  7. CAD - 7 stents, CABG, on Ranexa.  8. DM - insulin per primary.     LOS: 6 days   LYLES,CHARLES 07/25/2013,10:15 AM  Pt seen, examined and agree w A/P as above.  [name] MD pager [phone]    cell [phone] 07/25/2013, 11:32 AM

## 2013-07-25 NOTE — Progress Notes (Signed)
Physical Therapy Session Note  Patient Details  Name: Holly Ellison MRN: [id number] Date of Birth: 09-29-1940  Today's Date: 07/25/2013 Time: [phone] and 1050 Time Calculation (min): 25 min   Short Term Goals: Week 1:  PT Short Term Goal 1 (Week 1): Pt will be able to complete supine to sit/sit to supine transfers with mod A to promote OOB activities PT Short Term Goal 2 (Week 1): Pt will be able to complete sit to stand/stand to sit transfers with min A.  PT Short Term Goal 3 (Week 1): Pt will be able to tolerate static standing for 60-75 seconds to promote increased weight bearing and pre-gait activities without knee buckling.  PT Short Term Goal 4 (Week 1): Pt will be able to ambulate 10-15 feet using the LRAD with mod A to promote increased functional mobility within the home.  PT Short Term Goal 5 (Week 1): Pt will be able to propel wheel chair 50 feet with SBA to promote functional mobility within the home and community.  Skilled Therapeutic Interventions/Progress Updates:   Treatment 1: PT notified by OT prior to session regarding pt's increase in back pain/spasm. Session focused on activity tolerance, bed mobility, and positioning. Pt received L sidelying in bed with pillow at back for support. Pt with no report of pain at rest although visibly upset and crying over pain episode with OT, emotional support provided. With coaxing, pt agreeable to donning pants at bed level. With log roll technique side to side to assist in pulling up pants, pt with c/o pain, see below. Repositioned patient on R side due to report of L back pain, pillow at back for support. Pt grimacing and crying, shouting out in pain. Pt unable to verbalize if change in positioning improved or worsened pain, continued emotional support provided. Patient left in bed and RN notified of continued 10/10 pain. Pt missed 35 min of skilled physical therapy due to increased pain.   Treatment 2: Attempted to see patient for skilled  therapy session. Pt in R sidelying with pillow behind back for support and continued report of 10/10 pain. Pt grimacing with light touch on shoulder, refusing therapy. Discussed with patient role of RT and RT evaluation scheduled later and encouraged patient to attempt sitting up for scheduled session. Pt in agreement with plan. RN present to apply k-pad to L back, as RN pulled back covers patient moaning in pain without RN even touching her. Pt declining more pain medication. Pt missed 30 minutes of skilled physical therapy due to increased pain. Will f/u as able per POC.   Therapy Documentation Precautions:  Precautions Precautions: Back;Fall Precaution Comments: Fistula L arm Required Braces or Orthoses: Spinal Brace Restrictions Weight Bearing Restrictions: No General: Amount of Missed PT Time (min): 35 Minutes and 30 minutes Missed Time Reason: Pain Pain: Pain Assessment Pain Assessment: 0-10 Pain Score: 10-Worst pain ever Pain Type: Acute pain Pain Location: Back Pain Orientation: Mid;Left Pain Descriptors / Indicators: Aching;Spasm;Crying;Sharp Pain Onset: With Activity Pain Intervention(s): Repositioned;RN made aware  See FIM for current functional status  Therapy/Group: Individual Therapy  Laretta Alstrom 07/25/2013, 9:34 AM

## 2013-07-25 NOTE — Progress Notes (Signed)
OT notified RN of sudden onset of back pain for patient while sitting in wheelchair, eating breakfast.  Assisted OT in placing patient back in bed, lying on side.  Patient now resting comfortably for the time being.  Will continue to monitor for pain.  Patient did not eat adequate breakfast and CBG slightly low, gave patient Nepro to drink to supplement breakfast.  Will continue to monitor closely.  Brita Romp, RN

## 2013-07-25 NOTE — Progress Notes (Signed)
Patient ID: Holly Ellison, female   DOB: 04-16-1940, 73 y.o.   MRN: [id number] 73 y.o. female with history of DM type 2 with neuropathy, CAD, COPD, ESRD, HA, who was admitted on 07/16/13 pm with one week history of BLE, fecal incontinence and severe back pain. 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 Vertell Limber. Frozen section appeared benign and NS questions old or indolent osteomyelitis. Patient with history of MSSA bacteremia 07/2012 s/p 4 weeks of antibiotic therapy. CT chest without evidence of malignancy. HD ongoing and labs with evidence of ABLA with down to 7.1 as well as leucocytosis with WBC-16.0.    Subjective/Complaints: No new problems overnight. Had HD. Pain better. bp's better    Review of Systems  Musculoskeletal: Positive for back pain.  All other systems reviewed and are negative.  Objective: Vital Signs: Blood pressure 125/61, pulse 65, temperature 97.9 F (36.6 C), temperature source Oral, resp. rate 18, height 5' 6.14" (1.68 m), weight 73.846 kg (162 lb 12.8 oz), SpO2 98.00%. No results found. Results for orders placed during the hospital encounter of 07/19/13 (from the past 72 hour(s))  GLUCOSE, CAPILLARY     Status: Abnormal   Collection Time    07/22/13 11:20 AM      Result Value Ref Range   Glucose-Capillary 172 (*) 70 - 99 mg/dL   Comment 1 Notify RN    GLUCOSE, CAPILLARY     Status: Abnormal   Collection Time    07/22/13  4:38 PM      Result Value Ref Range   Glucose-Capillary 146 (*) 70 - 99 mg/dL  GLUCOSE, CAPILLARY     Status: Abnormal   Collection Time    07/22/13  9:26 PM      Result Value Ref Range   Glucose-Capillary 207 (*) 70 - 99 mg/dL  GLUCOSE, CAPILLARY     Status: Abnormal   Collection Time    07/23/13  7:20 AM      Result Value Ref Range   Glucose-Capillary 156 (*) 70 - 99 mg/dL   Comment 1 Notify RN    GLUCOSE, CAPILLARY     Status: Abnormal   Collection Time    07/23/13 11:22 AM      Result Value Ref Range   Glucose-Capillary 159 (*) 70 - 99 mg/dL   Comment 1 Notify RN    GLUCOSE, CAPILLARY     Status: Abnormal   Collection Time    07/23/13  4:49 PM      Result Value Ref Range   Glucose-Capillary 148 (*) 70 - 99 mg/dL  GLUCOSE, CAPILLARY     Status: Abnormal   Collection Time    07/23/13  9:47 PM      Result Value Ref Range   Glucose-Capillary 108 (*) 70 - 99 mg/dL  GLUCOSE, CAPILLARY     Status: None   Collection Time    07/24/13  7:18 AM      Result Value Ref Range   Glucose-Capillary 84  70 - 99 mg/dL   Comment 1 Notify RN    GLUCOSE, CAPILLARY     Status: None   Collection Time    07/24/13  9:48 AM      Result Value Ref Range   Glucose-Capillary 99  70 - 99 mg/dL   Comment 1 Notify RN    GLUCOSE, CAPILLARY     Status:  None   Collection Time    07/24/13 11:24 AM      Result Value Ref Range   Glucose-Capillary 75  70 - 99 mg/dL  GLUCOSE, CAPILLARY     Status: None   Collection Time    07/24/13  4:56 PM      Result Value Ref Range   Glucose-Capillary 82  70 - 99 mg/dL  GLUCOSE, CAPILLARY     Status: Abnormal   Collection Time    07/24/13  9:59 PM      Result Value Ref Range   Glucose-Capillary 123 (*) 70 - 99 mg/dL  RENAL FUNCTION PANEL     Status: Abnormal   Collection Time    07/25/13  6:10 AM      Result Value Ref Range   Sodium 134 (*) 137 - 147 mEq/L   Potassium 4.5  3.7 - 5.3 mEq/L   Chloride 93 (*) 96 - 112 mEq/L   CO2 29  19 - 32 mEq/L   Glucose, Bld 68 (*) 70 - 99 mg/dL   BUN 50 (*) 6 - 23 mg/dL   Creatinine, Ser 4.89 (*) 0.50 - 1.10 mg/dL   Calcium 9.6  8.4 - 10.5 mg/dL   Phosphorus 3.0  2.3 - 4.6 mg/dL   Albumin 2.7 (*) 3.5 - 5.2 g/dL   GFR calc non Af Amer 8 (*) >90 mL/min   GFR calc Af Amer 9 (*) >90 mL/min   Comment: (NOTE)     The eGFR has been calculated using the CKD EPI equation.     This calculation has not  been validated in all clinical situations.     eGFR's persistently <90 mL/min signify possible Chronic Kidney     Disease.  GLUCOSE, CAPILLARY     Status: Abnormal   Collection Time    07/25/13  7:52 AM      Result Value Ref Range   Glucose-Capillary 64 (*) 70 - 99 mg/dL   Comment 1 Notify RN        Nursing note and vitals reviewed.  Constitutional: She is oriented to person, place, and time. She appears well-developed and well-nourished. She is sleeping. She is easily aroused.  HENT:  Head: Normocephalic and atraumatic.  Eyes: Conjunctivae are normal. Pupils are equal, round, and reactive to light.  Neck: Normal range of motion. Neck supple.  Cardiovascular: Normal rate and regular rhythm.  Respiratory: Effort normal and breath sounds normal. No respiratory distress. She has no wheezes.  GI: Soft. Bowel sounds are normal. She exhibits no distension. There is no tenderness.  Musculoskeletal: She exhibits no edema and no tenderness.  Neurological: She is oriented to person, place, and time and easily aroused.  Speech clear. Follows commands without difficulty. Diffuse weakness--BUE/LLE>RLE. Bilateral hands with thenar atrophy and contracture with decrease in fine motor movements. Numbness stocking-glove distribution persists Skin: Skin is warm and dry.  Mid thoracic back incision intact.  4 minus bilateral deltoid, bicep, tricep 3 minus bilateral finger flexors and extensors  2 minus bilateral hip flexors 3 minus bilaterally knee extensors 2 minus bilateral ankle dorsiflexors and plantar flexors   Assessment/Plan: 1. Functional deficits secondary to T11 lesion with paraplegia which require 3+ hours per day of interdisciplinary therapy in a comprehensive inpatient rehab setting. Physiatrist is providing close team supervision and 24 hour management of active medical problems listed below. Physiatrist and rehab team continue to assess barriers to discharge/monitor patient progress  toward functional and medical goals. FIM: FIM - Bathing Bathing  Steps Patient Completed: Chest;Right Arm;Left Arm;Abdomen;Front perineal area;Right upper leg;Left upper leg Bathing: 3: Mod-Patient completes 5-7 55f10 parts or 50-74%  FIM - Upper Body Dressing/Undressing Upper body dressing/undressing steps patient completed: Thread/unthread right sleeve of pullover shirt/dresss;Thread/unthread left sleeve of pullover shirt/dress Upper body dressing/undressing: 3: Mod-Patient completed 50-74% of tasks FIM - Lower Body Dressing/Undressing Lower body dressing/undressing steps patient completed: Thread/unthread left underwear leg;Thread/unthread left pants leg Lower body dressing/undressing: 1: Total-Patient completed less than 25% of tasks  FIM - Toileting Toileting steps completed by patient: Adjust clothing prior to toileting;Performs perineal hygiene;Adjust clothing after toileting Toileting Assistive Devices: Grab bar or rail for support Toileting: 1: Total-Patient completed zero steps, helper did all 3  FIM - TRadio producerDevices: Grab bars Toilet Transfers: 3-To toilet/BSC: Mod A (lift or lower assist)  FIM - BControl and instrumentation engineerDevices: Arm rests;Bed rails Bed/Chair Transfer: 4: Sit > Supine: Min A (steadying pt. > 75%/lift 1 leg);3: Bed > Chair or W/C: Mod A (lift or lower assist)  FIM - Locomotion: Wheelchair Distance: 60 Locomotion: Wheelchair: 2: Travels 50 - 149 ft with maximal assistance (Pt: 25 - 49%) FIM - Locomotion: Ambulation Locomotion: Ambulation Assistive Devices: Parallel bars Ambulation/Gait Assistance: 3: Mod assist Locomotion: Ambulation: 0: Activity did not occur  Comprehension Comprehension Mode: Auditory Comprehension: 3-Understands basic 50 - 74% of the time/requires cueing 25 - 50%  of the time  Expression Expression Mode: Verbal Expression: 4-Expresses basic 75 - 89% of the time/requires  cueing 10 - 24% of the time. Needs helper to occlude trach/needs to repeat words.  Social Interaction Social Interaction: 4-Interacts appropriately 75 - 89% of the time - Needs redirection for appropriate language or to initiate interaction.  Problem Solving Problem Solving: 3-Solves basic 50 - 74% of the time/requires cueing 25 - 49% of the time  Memory Memory: 3-Recognizes or recalls 50 - 74% of the time/requires cueing 25 - 49% of the time  Medical Problem List and Plan:  1. 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  2. DVT Prophylaxis/Anticoagulation: Mechanical: Antiembolism stockings, thigh (TED hose) Bilateral lower extremities  Sequential compression devices, below knee Bilateral lower extremities  3. Pain Management: Will continue vicodin prn as this is effective.   -robaxin made her drowsy----dc and try low dose baclofen 551mq8 prn 4. H/o depression/Mood: Resume lexapro. LCSW to follow for evaluation and support.  5. Neuropsych: This patient is capable of making decisions on her own behalf.  6. ESRD: Continue HD on TTS past therapy sessions.  7. DM type 2 with peripheral neuropathy:  Continue lantus insulin (increased to 30u) with SSI for elevated BS. Sugars improving---back down to 25 units given hypoglycemia     8. CAD: Continue plavix, lipitor, lopressor and ranexa.  9. Leucocytosis: Monitor for signs of infection with close watch on temp curve as well as wound.  10. HTN: increased norvasc to 65m47maily. bp's leveled out yesterday (low with hd)   -if needed Consider clonidine patch. Prn po clonidine in place.  11. Acute on chronic anemia: On aranesp. Transfusion with dialysis prn.    LOS (Days) 6 A FACE TO FACE EVALUATION WAS PERFORMED  SWARTZ,ZACHARY T 07/25/2013, 8:17 AM

## 2013-07-26 ENCOUNTER — Inpatient Hospital Stay (HOSPITAL_COMMUNITY): Payer: Medicare Other

## 2013-07-26 ENCOUNTER — Encounter (HOSPITAL_COMMUNITY): Payer: Medicare Other

## 2013-07-26 DIAGNOSIS — G8220 Paraplegia, unspecified: Secondary | ICD-10-CM

## 2013-07-26 LAB — GLUCOSE, CAPILLARY
GLUCOSE-CAPILLARY: 62 mg/dL — AB (ref 70–99)
Glucose-Capillary: 45 mg/dL — ABNORMAL LOW (ref 70–99)
Glucose-Capillary: 87 mg/dL (ref 70–99)
Glucose-Capillary: 131 mg/dL — ABNORMAL HIGH (ref 70–99)
Glucose-Capillary: 224 mg/dL — ABNORMAL HIGH (ref 70–99)

## 2013-07-26 MED ORDER — POLYETHYLENE GLYCOL 3350 17 G PO PACK
17.0000 g | PACK | Freq: Every day | ORAL | Status: DC
  Administered 2013-07-26 – 2013-07-30 (×5): 17 g via ORAL
  Filled 2013-07-26 (×6): qty 1

## 2013-07-26 MED ORDER — GLUCOSE 40 % PO GEL
ORAL | Status: AC
  Administered 2013-07-26: 37.5 g
  Filled 2013-07-26: qty 1

## 2013-07-26 MED ORDER — DARBEPOETIN ALFA-POLYSORBATE 100 MCG/0.5ML IJ SOLN
100.0000 ug | INTRAMUSCULAR | Status: DC
  Administered 2013-08-01 – 2013-08-15 (×3): 100 ug via INTRAVENOUS
  Filled 2013-07-26 (×3): qty 0.5

## 2013-07-26 MED ORDER — BOOST / RESOURCE BREEZE PO LIQD
1.0000 | Freq: Three times a day (TID) | ORAL | Status: DC
  Administered 2013-07-26 – 2013-08-14 (×39): 1 via ORAL

## 2013-07-26 MED ORDER — INSULIN GLARGINE 100 UNIT/ML ~~LOC~~ SOLN
20.0000 [IU] | Freq: Every day | SUBCUTANEOUS | Status: DC
  Administered 2013-07-27 – 2013-07-31 (×5): 20 [IU] via SUBCUTANEOUS
  Filled 2013-07-26 (×7): qty 0.2

## 2013-07-26 MED ORDER — BACLOFEN 10 MG PO TABS
10.0000 mg | ORAL_TABLET | Freq: Three times a day (TID) | ORAL | Status: DC | PRN
  Administered 2013-07-26: 10 mg via ORAL
  Filled 2013-07-26: qty 1

## 2013-07-26 MED ORDER — GLUCOSE 40 % PO GEL: 1.0000 | ORAL | Status: DC | PRN

## 2013-07-26 NOTE — Progress Notes (Signed)
Patient ID: Holly Ellison, female   DOB: Mar 30, 1940, 73 y.o.   MRN: [id number] 73 y.o. female with history of DM type 2 with neuropathy, CAD, COPD, ESRD, HA, who was admitted on 07/16/13 pm with one week history of BLE, fecal incontinence and severe back pain. 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 Vertell Limber. Frozen section appeared benign and NS questions old or indolent osteomyelitis. Patient with history of MSSA bacteremia 07/2012 s/p 4 weeks of antibiotic therapy. CT chest without evidence of malignancy. HD ongoing and labs with evidence of ABLA with down to 7.1 as well as leucocytosis with WBC-16.0.    Subjective/Complaints: Hypoglcyemic, having more back pain. Didn't eat much yesterday. Anxious    Review of Systems   All other systems reviewed and are negative.  Objective: Vital Signs: Blood pressure 163/62, pulse 67, temperature 99.1 F (37.3 C), temperature source Oral, resp. rate 19, height 5' 6.14" (1.68 m), weight 73.2 kg (161 lb 6 oz), SpO2 98.00%. No results found. Results for orders placed during the hospital encounter of 07/19/13 (from the past 72 hour(s))  GLUCOSE, CAPILLARY     Status: Abnormal   Collection Time    07/23/13 11:22 AM      Result Value Ref Range   Glucose-Capillary 159 (*) 70 - 99 mg/dL   Comment 1 Notify RN    GLUCOSE, CAPILLARY     Status: Abnormal   Collection Time    07/23/13  4:49 PM      Result Value Ref Range   Glucose-Capillary 148 (*) 70 - 99 mg/dL  GLUCOSE, CAPILLARY     Status: Abnormal   Collection Time    07/23/13  9:47 PM      Result Value Ref Range   Glucose-Capillary 108 (*) 70 - 99 mg/dL  GLUCOSE, CAPILLARY     Status: None   Collection Time    07/24/13  7:18 AM      Result Value Ref Range   Glucose-Capillary 84  70 - 99 mg/dL   Comment 1 Notify RN    GLUCOSE, CAPILLARY      Status: None   Collection Time    07/24/13  9:48 AM      Result Value Ref Range   Glucose-Capillary 99  70 - 99 mg/dL   Comment 1 Notify RN    GLUCOSE, CAPILLARY     Status: None   Collection Time    07/24/13 11:24 AM      Result Value Ref Range   Glucose-Capillary 75  70 - 99 mg/dL  GLUCOSE, CAPILLARY     Status: None   Collection Time    07/24/13  4:56 PM      Result Value Ref Range   Glucose-Capillary 82  70 - 99 mg/dL  GLUCOSE, CAPILLARY     Status: Abnormal   Collection Time    07/24/13  9:59 PM      Result Value Ref Range   Glucose-Capillary 123 (*) 70 - 99 mg/dL  RENAL FUNCTION PANEL     Status: Abnormal   Collection Time    07/25/13  6:10 AM      Result Value Ref Range   Sodium 134 (*) 137 - 147 mEq/L   Potassium 4.5  3.7 - 5.3 mEq/L   Chloride 93 (*) 96 - 112 mEq/L   CO2 29  19 - 32 mEq/L  Glucose, Bld 68 (*) 70 - 99 mg/dL   BUN 50 (*) 6 - 23 mg/dL   Creatinine, Ser 4.89 (*) 0.50 - 1.10 mg/dL   Calcium 9.6  8.4 - 10.5 mg/dL   Phosphorus 3.0  2.3 - 4.6 mg/dL   Albumin 2.7 (*) 3.5 - 5.2 g/dL   GFR calc non Af Amer 8 (*) >90 mL/min   GFR calc Af Amer 9 (*) >90 mL/min   Comment: (NOTE)     The eGFR has been calculated using the CKD EPI equation.     This calculation has not been validated in all clinical situations.     eGFR's persistently <90 mL/min signify possible Chronic Kidney     Disease.  GLUCOSE, CAPILLARY     Status: Abnormal   Collection Time    07/25/13  7:52 AM      Result Value Ref Range   Glucose-Capillary 64 (*) 70 - 99 mg/dL   Comment 1 Notify RN    GLUCOSE, CAPILLARY     Status: None   Collection Time    07/25/13  8:12 AM      Result Value Ref Range   Glucose-Capillary 72  70 - 99 mg/dL   Comment 1 Notify RN    GLUCOSE, CAPILLARY     Status: Abnormal   Collection Time    07/25/13 11:27 AM      Result Value Ref Range   Glucose-Capillary 112 (*) 70 - 99 mg/dL   Comment 1 Notify RN    CBC     Status: Abnormal   Collection Time     07/25/13  4:50 PM      Result Value Ref Range   WBC 13.7 (*) 4.0 - 10.5 K/uL   RBC 2.08 (*) 3.87 - 5.11 MIL/uL   Hemoglobin 7.0 (*) 12.0 - 15.0 g/dL   HCT 20.6 (*) 36.0 - 46.0 %   MCV 99.0  78.0 - 100.0 fL   MCH 33.7  26.0 - 34.0 pg   MCHC 34.0  30.0 - 36.0 g/dL   RDW 16.7 (*) 11.5 - 15.5 %   Platelets 359  150 - 400 K/uL  GLUCOSE, CAPILLARY     Status: Abnormal   Collection Time    07/25/13 10:19 PM      Result Value Ref Range   Glucose-Capillary 59 (*) 70 - 99 mg/dL  GLUCOSE, CAPILLARY     Status: Abnormal   Collection Time    07/26/13  6:56 AM      Result Value Ref Range   Glucose-Capillary 45 (*) 70 - 99 mg/dL  GLUCOSE, CAPILLARY     Status: Abnormal   Collection Time    07/26/13  7:10 AM      Result Value Ref Range   Glucose-Capillary 62 (*) 70 - 99 mg/dL  GLUCOSE, CAPILLARY     Status: None   Collection Time    07/26/13  7:26 AM      Result Value Ref Range   Glucose-Capillary 87  70 - 99 mg/dL   Comment 1 Notify RN        Nursing note and vitals reviewed.  Constitutional: She is oriented to person, place, and time. She appears well-developed and well-nourished. She is sleeping. She is easily aroused.  HENT:  Head: Normocephalic and atraumatic.  Eyes: Conjunctivae are normal. Pupils are equal, round, and reactive to light.  Neck: Normal range of motion. Neck supple.  Cardiovascular: Normal rate and regular rhythm.  Respiratory: Effort  normal and breath sounds normal. No respiratory distress. She has no wheezes.  GI: Soft. Bowel sounds are normal. She exhibits no distension. There is no tenderness.  Musculoskeletal: She exhibits no edema, back is tender.  Neurological: She is oriented to person, place, and time and easily aroused.  Speech clear. Follows commands without difficulty. Diffuse weakness--BUE/LLE>RLE. Bilateral hands with thenar atrophy and contracture with decrease in fine motor movements. Numbness stocking-glove distribution persists Skin: Skin is warm  and dry.  Mid thoracic back incision intact. 4 minus bilateral deltoid, bicep, tricep 3 minus bilateral finger flexors and extensors  2 minus bilateral hip flexors 3 minus bilaterally knee extensors 2 minus bilateral ankle dorsiflexors and plantar flexors   Assessment/Plan: 1. Functional deficits secondary to T11 lesion with paraplegia which require 3+ hours per day of interdisciplinary therapy in a comprehensive inpatient rehab setting. Physiatrist is providing close team supervision and 24 hour management of active medical problems listed below. Physiatrist and rehab team continue to assess barriers to discharge/monitor patient progress toward functional and medical goals. FIM: FIM - Bathing Bathing Steps Patient Completed: Chest;Right Arm;Left Arm;Abdomen;Front perineal area;Right upper leg;Left upper leg Bathing: 3: Mod-Patient completes 5-7 16f10 parts or 50-74%  FIM - Upper Body Dressing/Undressing Upper body dressing/undressing steps patient completed: Thread/unthread right sleeve of pullover shirt/dresss;Thread/unthread left sleeve of pullover shirt/dress Upper body dressing/undressing: 3: Mod-Patient completed 50-74% of tasks FIM - Lower Body Dressing/Undressing Lower body dressing/undressing steps patient completed: Thread/unthread left underwear leg;Thread/unthread left pants leg Lower body dressing/undressing: 1: Total-Patient completed less than 25% of tasks  FIM - Toileting Toileting steps completed by patient: Adjust clothing prior to toileting;Performs perineal hygiene;Adjust clothing after toileting Toileting Assistive Devices: Grab bar or rail for support Toileting: 1: Total-Patient completed zero steps, helper did all 3  FIM - TRadio producerDevices: Grab bars Toilet Transfers: 3-To toilet/BSC: Mod A (lift or lower assist)  FIM - BControl and instrumentation engineerDevices: Arm rests;Bed rails Bed/Chair Transfer: 0: Activity  did not occur  FIM - Locomotion: Wheelchair Distance: 60 Locomotion: Wheelchair: 2: Travels 50 - 149 ft with maximal assistance (Pt: 25 - 49%) FIM - Locomotion: Ambulation Locomotion: Ambulation Assistive Devices: Parallel bars Ambulation/Gait Assistance: 3: Mod assist Locomotion: Ambulation: 0: Activity did not occur  Comprehension Comprehension Mode: Auditory Comprehension: 3-Understands basic 50 - 74% of the time/requires cueing 25 - 50%  of the time  Expression Expression Mode: Verbal Expression: 4-Expresses basic 75 - 89% of the time/requires cueing 10 - 24% of the time. Needs helper to occlude trach/needs to repeat words.  Social Interaction Social Interaction: 4-Interacts appropriately 75 - 89% of the time - Needs redirection for appropriate language or to initiate interaction.  Problem Solving Problem Solving: 3-Solves basic 50 - 74% of the time/requires cueing 25 - 49% of the time  Memory Memory: 3-Recognizes or recalls 50 - 74% of the time/requires cueing 25 - 49% of the time  Medical Problem List and Plan:  1. 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  2. DVT Prophylaxis/Anticoagulation: Mechanical: Antiembolism stockings, thigh (TED hose) Bilateral lower extremities  Sequential compression devices, below knee Bilateral lower extremities  3. Pain Management: Will continue vicodin prn as this is effective.   -increase baclofen to 148mq8 4. H/o depression/Mood: Resume lexapro. LCSW to follow for evaluation and support.  5. Neuropsych: This patient is capable of making decisions on her own behalf.  6. ESRD: Continue HD on  TTS after therapy sessions.  7. DM type 2 with peripheral neuropathy:  Continue lantus insulin (increased to 30u) with SSI for elevated BS. Sugars improving---back down to 20 units given hypoglycemia (INTAKE POOR YESTERDAY)    8. CAD: Continue plavix, lipitor, lopressor  and ranexa.  9. Leucocytosis: Monitor for signs of infection with close watch on temp curve as well as wound.  10. HTN: increased norvasc to 101m daily. bp's leveled out yesterday (low with hd)   -if needed Consider clonidine patch. Prn po clonidine in place.  11. Acute on chronic anemia: On aranesp. Transfusion with dialysis prn.    LOS (Days) 7 A FACE TO FACE EVALUATION WAS PERFORMED  SWARTZ,ZACHARY T 07/26/2013, 8:05 AM

## 2013-07-26 NOTE — Progress Notes (Signed)
Subjective:  Feel better than this am/ tolerated HD yesterday/mild constipation  Objective Vital signs in last 24 hours: Filed Vitals:   07/25/13 2130 07/25/13 2300 07/26/13 0600 07/26/13 0740  BP: 194/76 192/74 169/65 163/62  Pulse: 80 81 67   Temp:  98.2 F (36.8 C) 99.1 F (37.3 C)   TempSrc:  Oral Oral   Resp: 18 18 19    Height:      Weight:      SpO2:   98%    Weight change: 1.354 kg (2 lb 15.8 oz)  Physical Exam: General: Alert, NAD, awoken  From sleep Resp: CTA bilat.  Cardio: RRR without murmur , rub or gallop GI: + BS, soft and nontender / nondistended Extremities: No pedal  edema  Access: AVF @ LUA with + bruit   HD: [facility]  4h 2/2.25 Bath 73kg LUA AVF Heparin 4500  Aranesp 25 mcg q 4 weeks, last 6/9 Hectorol 4 ug TIW  Tsat 26%, ferr 1119, pth 286   Assessment/Plan:  1. 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  2. ESRD - HD on TTS @ [facility]. HD tomor/ Thinks she can stand now for wts  3. HTN/Volume - BP 163/62 at edw by bed wt attempt 3 to3.5 liter uf sat hd/ on Amlodipine 5 mg qd, Metoprolol 50 mg bid; wt 73.8 kg with HD pending.  4. Anemia - Hgb 7.1,>7.0  Aranesp 60 mcg.^ 150mcg and  Transfuse if <7  CBC pre-dialysis. 5. 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 Use 2.0 ca bath  6. Nutrition - Alb down to 2.7, renal carb-mod diet, vitamin.  Start breeze supplement 7. CAD - 7 stents, CABG, on Ranexa.  8. DM - insulin per primary.   Ernest Haber, PA-C [facility] [phone] 07/26/2013,11:42 AM  LOS: 7 days   Pt seen, examined and agree w A/P as above.  [name] MD pager [phone]    cell [phone] 07/26/2013, 12:38 PM    Labs: Basic Metabolic Panel:  Recent Labs Lab 07/20/13 1850 07/25/13 0610  NA 129* 134*  K 4.0 4.5  CL 87* 93*  CO2 25 29   GLUCOSE 310* 68*  BUN 57* 50*  CREATININE 5.14* 4.89*  CALCIUM 9.7 9.6  PHOS 2.8 3.0   Liver Function Tests:  Recent Labs Lab 07/20/13 1850 07/25/13 0610  ALBUMIN 2.8* 2.7*  CBC:  Recent Labs Lab 07/20/13 1850 07/25/13 1650  WBC 13.9* 13.7*  HGB 7.1* 7.0*  HCT 21.4* 20.6*  MCV 97.3 99.0  PLT 349 359  CBG:  Recent Labs Lab 07/25/13 1127 07/25/13 2219 07/26/13 0656 07/26/13 0710 07/26/13 0726  GLUCAP 112* 59* 45* 62* 87    Studies/Results: No results found. Medications:   . amLODipine  5 mg Oral Daily  . aspirin EC  81 mg Oral Daily  . atorvastatin  40 mg Oral q1800  . calcium carbonate  2 tablet Oral TID WC  . clopidogrel  75 mg Oral Daily  . darbepoetin (ARANESP) injection - DIALYSIS  60 mcg Intravenous Q Thu-HD  . docusate sodium  100 mg Oral BID  . escitalopram  10 mg Oral Daily  . gabapentin  100 mg Oral TID  . insulin aspart  0-9 Units Subcutaneous TID WC  . [START ON 07/27/2013] insulin glargine  20 Units  Subcutaneous Daily  . methimazole  10 mg Oral Daily  . metoprolol  50 mg Oral BID  . multivitamin  1 tablet Oral QHS  . pantoprazole  40 mg Oral Daily  . ranolazine  500 mg Oral BID

## 2013-07-26 NOTE — Progress Notes (Signed)
CRITICAL VALUE ALERT  Critical value received:  CBG: 45  Date of notification:  07/26/13  Time of notification:  0711  Critical value read back:yes  Nurse who received alert:  Rayetta Pigg, RN  MD notified (1st page):  Dr. Naaman Plummer  Time of first page:  0725  MD notified (2nd page):  Time of second page:  Responding MD:  Naaman Plummer  Time MD responded:  [phone]  Patient had hypoglycemic event this morning.  Patient asymptomatic except for diaphoresis.  Administered 1 tube of glucose gel and patients CBG came up to 87.  Notified Dr. Naaman Plummer who was on the floor making his rounds.  Patient tolerated procedure well.  Appears to be in no immediate distress at this time.  Will continue to monitor closely.  Brita Romp, RN

## 2013-07-26 NOTE — Progress Notes (Signed)
Physical Therapy Weekly Progress Note  Patient Details  Name: Holly Ellison MRN: [id number] Date of Birth: 1940-05-18  Beginning of progress report period: July 20, 2013 End of progress report period: July 26, 2013  Today's Date: 07/26/2013 Time:Treatment Session 1: 2482-5003; Treatment Session 2: 1430-1530 Time Calculation (min): Treatment Session 1: 55 min; Treatment Session 2: 11mn  Pt has made slow gains since initial eval, achieving 2/5 STGs. Pt able to inconsistently perform t/f sit<>stand and ambulation goals at short term goal level due to variable need for assist dependent on fatigue and pain. Pt continues to demonstrate significant difficulty with w/c propulsion due to decreased gross motor control and coordination  Pt demonstrating slow return of strength in B LE and fair-good day to day carry over of techniques. Pain recently limiting pt's ability to participate in therapies, medical team aware and addressing. When pt demonstrates decreased levels of pain and able to tolerate therapy she currently requires: min-mod A for bed mobility and t/f sit<>stand, mod A for squat pivot transfers, mod-max A for ambulation 10-15' in // bars and max A for w/c propulsion. Pt demonstrates fluctuating cognition throughout sessions regarding sustained attention, problem solving and sequencing appearing to be related to pain.   Patient continues to demonstrate the following deficits: decreased functional endurance, decreased sustained attention, decreased problem solving, decreased sequencing, decreased sitting and standing balance, decreased global strength, decreased gross coordination, decreased gross motor control, decreased intellectual/emergent awareness, pain, decreased overall functional mobility and therefore will continue to benefit from skilled PT intervention to enhance overall performance with activity tolerance, balance, postural control, ability to compensate for deficits, functional use of   right upper extremity, right lower extremity, left upper extremity and left lower extremity, awareness, coordination and knowledge of precautions.  Patient progressing toward long term goals.  Continue plan of care.  PT Short Term Goals Week 1:  PT Short Term Goal 1 (Week 1): Pt will be able to complete supine to sit/sit to supine transfers with mod A to promote OOB activities PT Short Term Goal 1 - Progress (Week 1): Met PT Short Term Goal 2 (Week 1): Pt will be able to complete sit to stand/stand to sit transfers with min A.  PT Short Term Goal 2 - Progress (Week 1): Partly met PT Short Term Goal 3 (Week 1): Pt will be able to tolerate static standing for 60-75 seconds to promote increased weight bearing and pre-gait activities without knee buckling.  PT Short Term Goal 3 - Progress (Week 1): Met PT Short Term Goal 4 (Week 1): Pt will be able to ambulate 10-15 feet using the LRAD with mod A to promote increased functional mobility within the home.  PT Short Term Goal 4 - Progress (Week 1): Partly met PT Short Term Goal 5 (Week 1): Pt will be able to propel wheel chair 50 feet with SBA to promote functional mobility within the home and community. PT Short Term Goal 5 - Progress (Week 1): Progressing toward goal Week 2:  PT Short Term Goal 1 (Week 2): Pt to perform bed mobility w/ min A and use of hospital bed functions PT Short Term Goal 2 (Week 2): Pt to perform bed<>w/c transfers w/ min A and  min cueing for technique.  PT Short Term Goal 3 (Week 2): Pt will be able to propel the wheelchair 50' w/ mod A in controlled environment PT Short Term Goal 4 (Week 2): Pt to transfer sit<>stand w/ min A and B UE support PT Short  Term Goal 5 (Week 2): Pt will be able to ambulate 20-25 feet using the LRAD with mod A   Skilled Therapeutic Interventions/Progress Updates:  Treatment Session 1:  1:1. Pt received supine in bed, ready for therapy. Focus this session on functional transfers, w/c propulsion,  and ambulation. Pt req mod A for t/f sup<>sit EOB w/ use of bed rails and cues to utilize log roll technique. Pt req mod A for squat pivot t/f w/c<>bed x3 and consistent cues for hand placement. Pt demonstrated improved ability to propel w/c today w/ B UE/LE x50' w/ mod A. Pt practiced multiple t/f sit<>stand initially w/ rail in hall, then RW, able to amb 4 steps then 6 steps w/ mod A overall. Pt req frequent rest breaks throughout session due to low back spasms, RN aware. Pt demonstrates fluctuating cognition throughout session regarding sustained attention, problem solving and sequencing appearing to be related to pain. Pt semi-reclined in bed at end of session w/ all needs in reach, bed alarm on.    Treatment Session 2:  1:1. Pt received semi-reclined in bed, ready for therapy. Focus this session on functional transfers, gross NMR and toileting. Pt req min A for t/f sup>sit EOB w/ use of bed rails demonstrating poor use of log roll technique despite cues. Immediately upon sitting EOB, pt stating "Oh! I should not have done that!." Pt req significantly increased time to sequence set up of squat pivot t/f bed>w/c w/out cues, mod A for completion but improved bottom clearance noted. Pt engaged in folding laundry while seated in w/c to target B UE NMR and t/f sit<>stand x1 w/ RW from w/c level to target B LE NMR w/ mod A. Immediately upon standing, pt stated need to use bathroom. Pt req mod A for squat pivot t/f w/c>toilet and total A for management of pants and pericare w/ continued emphasis on t/f sit<>stand w/ min A from elevated toilet seat and use of w/c handles during management for B UE support. Pt unable to demonstrate erect posture due to pain in back. Pt also very tearful during BM due to pain, RN aware. Pt stated continued need to use bathroom, nurse tech present to take over at end of session.   Therapy Documentation Precautions:  Precautions Precautions: Back;Fall Precaution Comments: Fistula L  arm Required Braces or Orthoses: Spinal Brace Restrictions Weight Bearing Restrictions: No  See FIM for current functional status  Therapy/Group: Individual Therapy  Gilmore Laroche 07/26/2013, 10:28 AM

## 2013-07-26 NOTE — Progress Notes (Signed)
Hypoglycemic Event  CBG: 59  Treatment: 15 GM carbohydrate snack  Symptoms: Hungry  Follow-up CBG: Time: CBG Result:104  Possible Reasons for Event: Inadequate meal intake  Comments/MD notified:    Mellody Memos L  Remember to initiate Hypoglycemia Order Set & completeAdult Hypoglycemia Protocol Treatment Guidelines  1. RN shall initiate Hypoglycemia Protocol emergency measures immediately when:            w        Routine or STAT CBG and/or a lab glucose indicates hypoglycemia (CBG < 70 mg/dl)  2. Treat the patient according to ability to take PO's and severity of hypoglycemia.   3. If patient is on GlucoStabilizer, follow directions provided by the [facility] for hypoglycemic events.  4. If patient on insulin pump, follow Hypoglycemia Protocol.  If patient requires more than one treatment have patient place pump in SUSPEND and notify MD.  DO NOT leave pump in SUSPEND for greater than 30 minutes unless ordered by MD.  A. Treatment for Mild or Moderate-Patient cooperative and able to swallow    1.  Patient taking PO's and can cooperate   a.  Give one of the following 15 gram CHO options:                           w     1 tube oral dextrose gel                           w     3-4 Glucose tablets                           w     4 oz. Juice                           w     4 oz. regular soda                                    ESRD patients:  clear, regular soda                           w     8 oz. skim milk    b.  Recheck CBG in 15 minutes after treatment                            w       If CBG < 70 mg/dl, repeat treatment and recheck until hypoglycemia is resolved                            w       If CBG > 70 mg/dl and next meal is more than 1 hour away, give additional 15 grams CHO   2.  Patient NPO-Patient cooperative and no altered mental status    a.  Give 25 ml of D50 IV.   b.  Recheck CBG in 15 minutes after treatment.                             w  If CBG is less than 70 mg/dl, repeat treatment and recheck until hypoglycemia is resolved.   c.  Notify MD for further orders.             SPECIAL CONSIDERATIONS:    a.  If no IV access,                              w        Start IV of D5W at [facility]                             w        Give 25 ml of D50 IV.    b.  If unable to gain IV access                             w          Give Glucagon IM:     i.  1 mg if patient weighs more than 45.5 kg     ii.  0.5 mg if patient weighs less than 45.5 kg   c.  Notify MD for further orders  B. Treatment for Severe-- Patient unconscious or unable to take PO's safely    1.  Position patient on side   2.  Give 50 ml D50 IV   3.  Recheck CBG in 15 minutes.                    w      If CBG is less than 70 mg/dl, repeat treatment and recheck until hypoglycemia is resolved.   4.  Notify MD for further orders.    SPECIAL CONSIDERATIONS:    a.  If no IV access                              w     Give Glucagon IM                                              i.  1 mg if patient weighs more than 45.5 kg                                             ii.  0.5 mg if patient weighs less than 45.5 kg                              w      Start IV of D5W at 50 ml/hr and give 50 ml D50 IV   b.  If no IV access and active seizure                               w       Call Rapid Response   c.  If unable to gain IV access, give Glucagon IM:  w          1 mg if patient weighs more than 45.5 kg                              w          0.5 mg if patient weighs less than 45.5 kg   d.  Notify MD for further orders.  C. Complete smart text progress note to document intervention and follow-up CBG   1. In [facility] patient chart, click on Notes (left side of screen)   2. Create Progress Note   3. Click on [employer].  In the Match box type "hypo" and enter    4. Double click on [facility] IP HYPOGLYCEMIC EVENT and enter data   5. MD must  be notified if patient is NPO or experienced severe hypoglycemia

## 2013-07-26 NOTE — Plan of Care (Signed)
Problem: SCI BOWEL ELIMINATION Goal: RH STG MANAGE BOWEL WITH ASSISTANCE STG Manage Bowel with Min Assistance.  Outcome: Not Progressing Continues to be incontinent of bowels  Problem: RH PAIN MANAGEMENT Goal: RH STG PAIN MANAGED AT OR BELOW PT'S PAIN GOAL Pain <2 on 0-10 scale with prn medications  Outcome: Not Progressing Spasms keep affecting patients pain

## 2013-07-26 NOTE — Progress Notes (Signed)
Occupational Therapy Session Note  Patient Details  Name: Holly Ellison MRN: [id number] Date of Birth: 1940-08-25  Today's Date: 07/26/2013  Session 1 Time: 2841-3244 Time Calculation (min): 33 min  Short Term Goals: Week 1:  OT Short Term Goal 1 (Week 1): Pt. will feed self with AE prn with no assist OT Short Term Goal 2 (Week 1): Pt. will bath LB with AE with mod assist OT Short Term Goal 3 (Week 1): pt. will stand for LB bathing with mod assist OT Short Term Goal 4 (Week 1): Pt. will transfer to dropped arm cc with mod assist  Skilled Therapeutic Interventions/Progress Updates:    Pt asleep on right side upon arrival.  Pt required tactile cue to arouse and was startled.  Pt immediately became tearful when awakened asking/stating "I don't know what to do; my sugar is too low then it's too high; my back hurts when I move; I don't know what to do." Patient had not eaten and I encouraged her to eat but she said she didn't have an appetite.  Pt agreed to some yogurt.  Pt repositioned in bed to eat breakfast.  Pt yells out in pain with all transitional movements and appears to be hypersensitive to touch as evidenced by grimacing when therapist touches patient.  Pt unable to engage in BADLs or bed mobility this morning.  Discussed with RN patient's report of pain and inability to fully engage in therapy.  Reported to RN that patient had eaten yogurt.   Therapy Documentation Precautions:  Precautions Precautions: Back;Fall Precaution Comments: Fistula L arm Required Braces or Orthoses: Spinal Brace Restrictions Weight Bearing Restrictions: No General: General Amount of Missed OT Time (min): 27 Minutes pain See FIM for current functional status  Therapy/Group: Individual Therapy  Session 2 Time: 1330-1400 Pt initially denied pain Individual therapy  Pt resting in bed upon arrival.  Pt stated that her pain was much better than in the morning and apologized for the morning session.  Pt  wanted to wash up and put some clothes on.  Pt completed bathing and dressing tasks seated EOB with sit<>stand to pull up pants. Pt maintained sitting balance with supervision and required max A for sit<>stand to pull up pants.  Pt experienced 2 occasions of increased pain while sitting EOB but was able to continue with therapy.  Pt returned to resting in bed at end of session.  Focus on bed mobility, dynamic sitting balance, activity tolerance, sit<>stand, and safety awareness.  Leotis Shames [facility] 07/26/2013, 8:49 AM

## 2013-07-27 ENCOUNTER — Inpatient Hospital Stay (HOSPITAL_COMMUNITY): Payer: Medicare Other | Admitting: *Deleted

## 2013-07-27 LAB — CBC
HCT: 22.5 % — ABNORMAL LOW (ref 36.0–46.0)
Hemoglobin: 7.4 g/dL — ABNORMAL LOW (ref 12.0–15.0)
MCH: 32.7 pg (ref 26.0–34.0)
MCHC: 32.9 g/dL (ref 30.0–36.0)
MCV: 99.6 fL (ref 78.0–100.0)
PLATELETS: 366 10*3/uL (ref 150–400)
RBC: 2.26 MIL/uL — ABNORMAL LOW (ref 3.87–5.11)
RDW: 16.9 % — ABNORMAL HIGH (ref 11.5–15.5)
WBC: 11.5 10*3/uL — AB (ref 4.0–10.5)

## 2013-07-27 LAB — RENAL FUNCTION PANEL
ALBUMIN: 2.7 g/dL — AB (ref 3.5–5.2)
BUN: 32 mg/dL — ABNORMAL HIGH (ref 6–23)
CALCIUM: 9.8 mg/dL (ref 8.4–10.5)
CO2: 28 mEq/L (ref 19–32)
CREATININE: 3.9 mg/dL — AB (ref 0.50–1.10)
Chloride: 93 mEq/L — ABNORMAL LOW (ref 96–112)
GFR calc Af Amer: 12 mL/min — ABNORMAL LOW (ref >90)
GFR, EST NON AFRICAN AMERICAN: 11 mL/min — AB (ref >90)
Glucose, Bld: 113 mg/dL — ABNORMAL HIGH (ref 70–99)
Phosphorus: 2.4 mg/dL (ref 2.3–4.6)
Potassium: 3.8 mEq/L (ref 3.7–5.3)
Sodium: 133 mEq/L — ABNORMAL LOW (ref 137–147)

## 2013-07-27 LAB — GLUCOSE, CAPILLARY
Glucose-Capillary: 183 mg/dL — ABNORMAL HIGH (ref 70–99)
Glucose-Capillary: 88 mg/dL (ref 70–99)
Glucose-Capillary: 157 mg/dL — ABNORMAL HIGH (ref 70–99)
Glucose-Capillary: 112 mg/dL — ABNORMAL HIGH (ref 70–99)

## 2013-07-27 LAB — PREPARE RBC (CROSSMATCH)

## 2013-07-27 NOTE — Progress Notes (Signed)
Occupational Therapy Session Note  Patient Details  Name: Holly Ellison MRN: [id number] Date of Birth: 1940/08/24  Today's Date: 07/27/2013 Time: 1130-1210 Time Calculation (min): 40 min  Short Term Goals: Week 1:  OT Short Term Goal 1 (Week 1): Pt. will feed self with AE prn with no assist OT Short Term Goal 2 (Week 1): Pt. will bath LB with AE with mod assist OT Short Term Goal 3 (Week 1): pt. will stand for LB bathing with mod assist OT Short Term Goal 4 (Week 1): Pt. will transfer to dropped arm cc with mod assist Week 2:     Skilled Therapeutic Interventions/Progress Updates:    Pt. Lying ibed.  Pt smiled and agreed to get up.  Sat on EOB to doff gown and donn shirt.  Pt. Stated iniatilly she was not in any pain.  After sitting EOB, pt began wincing.  Pt. Stated "sometihing is not right".  Pt. Unable to verbalize what was hurting or how much it was hurting.  After donning shirt, attempted to don back brace and pt refused stating it makes it hurt worse.  Pt stopped answering OT questions the last 10 minutes of session.  Assisted pt back to bed after changing wet brief in supine.  Reported pt's behavior to RN who had seen similar behavior.  Left pt in bed with safety belt on and call bell,phone within reach.     Therapy Documentation Precautions:  Precautions Precautions: Back;Fall Precaution Comments: Fistula L arm Required Braces or Orthoses: Spinal Brace Restrictions Weight Bearing Restrictions: No       Pain: Pain Assessment Pain Assessment: No/denies pain  Initially but then increased near end of session       See FIM for current functional status  Therapy/Group: Individual Therapy  Lisa Roca 07/27/2013, 12:29 PM

## 2013-07-27 NOTE — Procedures (Signed)
I was present at this dialysis session, have reviewed the session itself and made  appropriate changes  [name] MD (pgr) [phone]    ([phone] 07/27/2013, 5:04 PM

## 2013-07-27 NOTE — Progress Notes (Signed)
Patient back to room from hemodialysis via hospital bed.

## 2013-07-27 NOTE — Progress Notes (Signed)
Hemodialysis- Pt has orders for transfusion, Type and screen sent. Blood bank called and pt has antibodies. She has 30 minutes left on HD. Dr. Jonnie Finner paged. Order to give prbcs tomorrow 6/21 on floor to give blood bank time to crossmatch. Will notify primary RN

## 2013-07-27 NOTE — Progress Notes (Signed)
Subjective:  HD yesterday 3029 uf with wt to 73.2 ( edw 73) / today"feels woozy headed" bp stable to ^/ Vicodin last at 8 pm/  BS 152/on TID Gabapentin100 mg   Objective Vital signs in last 24 hours: Filed Vitals:   07/26/13 1425 07/26/13 2100 07/26/13 2323 07/27/13 0537  BP: 156/60 140/72 188/67 168/75  Pulse: 66 80 79 71  Temp: 98.2 F (36.8 C)  98.7 F (37.1 C) 97.8 F (36.6 C)  TempSrc: Oral  Oral Oral  Resp: 18 20 18 19   Height:      Weight:      SpO2: 97%  99% 100%   Weight change:   Physical Exam: General: ALert OX3 NAd. Trying to eat Lunch, but does not want now/ moves all extrem ,  Resp: CTA bilat.  Cardio: RRR without murmur , rub or gallop  GI: + BS, soft and nontender / nondistended  Extremities: No pedal edema  Access: AVF @ LUA with + bruit   HD: [facility]  4h 2/2.25 Bath 73kg LUA AVF Heparin 4500  Aranesp 25 mcg q 4 weeks, last 6/9 Hectorol 4 ug TIW  Tsat 26%, ferr 1119, pth 286   Assessment/Plan:  1. 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  2. ESRD - HD on TTS @ [facility].  attempt lower edw/  Labs pre hd 3. HTN/Volume - BP 168/75 at edw by bed wt attempt Donnamarie Rossetti  Next hd / on Amlodipine 5 mg qd /Also on  Metoprolol 50 mg bid; 4. AMS- hold Gabapentin/ ? From anemia  also 5. Anemia - Hgb 7.1,>7.0 Aranesp 60 mcg.^ 111mcg and Transfuse if <8 / fu am hgb and tfs 6. 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 Use 2.0 ca bath / fu am labs 7. Nutrition - Alb down to 2.7, renal carb-mod diet, vitamin. Start breeze supplement 8. CAD - 7 stents, CABG, on Ranexa.  9. DM - insulin per primary.   Ernest Haber, PA-C [place] [phone] 07/27/2013,1:34 PM  LOS: 8 days   Pt seen, examined and agree w A/P as above. BP up, HD today with maximum UF as tolerated.  [name] MD pager [phone]    cell [phone] 07/27/2013, 5:03 PM    Labs: Basic Metabolic Panel:  Recent Labs Lab 07/20/13 1850 07/25/13 0610  NA 129* 134*  K 4.0 4.5  CL 87* 93*  CO2 25 29  GLUCOSE 310* 68*  BUN 57* 50*  CREATININE 5.14* 4.89*  CALCIUM 9.7 9.6  PHOS 2.8 3.0   Liver Function Tests:  Recent Labs Lab 07/20/13 1850 07/25/13 0610  ALBUMIN 2.8* 2.7*  CBC:  Recent Labs Lab 07/20/13 1850 07/25/13 1650  WBC 13.9* 13.7*  HGB 7.1* 7.0*  HCT 21.4* 20.6*  MCV 97.3 99.0  PLT 349 359    Recent Labs Lab 07/26/13 1200 07/26/13 1651 07/26/13 2129 07/27/13 0725 07/27/13 1118  GLUCAP 131* 224* 183* 88 157*   Medications:   . amLODipine  5 mg Oral Daily  . aspirin EC  81 mg Oral Daily  . atorvastatin  40 mg Oral q1800  . calcium carbonate  2 tablet Oral TID WC  . clopidogrel  75 mg Oral Daily  . [START ON 08/01/2013] darbepoetin (ARANESP) injection - DIALYSIS  100 mcg Intravenous  Q Thu-HD  . docusate sodium  100 mg Oral BID  . escitalopram  10 mg Oral Daily  . feeding supplement (RESOURCE BREEZE)  1 Container Oral TID BM  . gabapentin  100 mg Oral TID  . insulin aspart  0-9 Units Subcutaneous TID WC  . insulin glargine  20 Units Subcutaneous Daily  . methimazole  10 mg Oral Daily  . metoprolol  50 mg Oral BID  . multivitamin  1 tablet Oral QHS  . pantoprazole  40 mg Oral Daily  . polyethylene glycol  17 g Oral Daily  . ranolazine  500 mg Oral BID

## 2013-07-27 NOTE — Progress Notes (Signed)
Patient refusing to take medications, eat dinner tray.  Crying and stating, "I want  to go home. I don't know why ya'll are keeping me here."

## 2013-07-27 NOTE — Progress Notes (Signed)
Dr. Jonnie Finner notified of patient needing IV access to receive blood transfusion.  Order received:  Cancel transfusion until next hemodialysis tx.

## 2013-07-27 NOTE — Progress Notes (Signed)
Patient ID: Holly Ellison, female   DOB: [birth year], 73 y.o.   MRN: [id number] 73 y.o. female with history of DM type 2 with neuropathy, CAD, COPD, ESRD, HA, who was admitted on 07/16/13 pm with one week history of BLE, fecal incontinence and severe back pain. 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 Vertell Limber. Frozen section appeared benign and NS questions old or indolent osteomyelitis. Patient with history of MSSA bacteremia 07/2012 s/p 4 weeks of antibiotic therapy. CT chest without evidence of malignancy. HD ongoing and labs with evidence of ABLA with down to 7.1 as well as leucocytosis with WBC-16.0.    Subjective/Complaints: Feeling better this am. Sugar 88. In good spirits. Pain under reasonable control at present  Review of Systems   All other systems reviewed and are negative.  Objective: Vital Signs: Blood pressure 168/75, pulse 71, temperature 97.8 F (36.6 C), temperature source Oral, resp. rate 19, height 5' 6.14" (1.68 m), weight 73.2 kg (161 lb 6 oz), SpO2 100.00%. No results found. Results for orders placed during the hospital encounter of 07/19/13 (from the past 72 hour(s))  GLUCOSE, CAPILLARY     Status: None   Collection Time    07/24/13  9:48 AM      Result Value Ref Range   Glucose-Capillary 99  70 - 99 mg/dL   Comment 1 Notify RN    GLUCOSE, CAPILLARY     Status: None   Collection Time    07/24/13 11:24 AM      Result Value Ref Range   Glucose-Capillary 75  70 - 99 mg/dL  GLUCOSE, CAPILLARY     Status: None   Collection Time    07/24/13  4:56 PM      Result Value Ref Range   Glucose-Capillary 82  70 - 99 mg/dL  GLUCOSE, CAPILLARY     Status: Abnormal   Collection Time    07/24/13  9:59 PM      Result Value Ref Range   Glucose-Capillary 123 (*) 70 - 99 mg/dL  RENAL FUNCTION PANEL     Status: Abnormal    Collection Time    07/25/13  6:10 AM      Result Value Ref Range   Sodium 134 (*) 137 - 147 mEq/L   Potassium 4.5  3.7 - 5.3 mEq/L   Chloride 93 (*) 96 - 112 mEq/L   CO2 29  19 - 32 mEq/L   Glucose, Bld 68 (*) 70 - 99 mg/dL   BUN 50 (*) 6 - 23 mg/dL   Creatinine, Ser 4.89 (*) 0.50 - 1.10 mg/dL   Calcium 9.6  8.4 - 10.5 mg/dL   Phosphorus 3.0  2.3 - 4.6 mg/dL   Albumin 2.7 (*) 3.5 - 5.2 g/dL   GFR calc non Af Amer 8 (*) >90 mL/min   GFR calc Af Amer 9 (*) >90 mL/min   Comment: (NOTE)     The eGFR has been calculated using the CKD EPI equation.     This calculation has not been validated in all clinical situations.     eGFR's persistently <90 mL/min signify possible Chronic Kidney     Disease.  GLUCOSE, CAPILLARY     Status: Abnormal   Collection Time    07/25/13  7:52 AM      Result Value Ref Range   Glucose-Capillary 64 (*) 70 - 99 mg/dL  Comment 1 Notify RN    GLUCOSE, CAPILLARY     Status: None   Collection Time    07/25/13  8:12 AM      Result Value Ref Range   Glucose-Capillary 72  70 - 99 mg/dL   Comment 1 Notify RN    GLUCOSE, CAPILLARY     Status: Abnormal   Collection Time    07/25/13 11:27 AM      Result Value Ref Range   Glucose-Capillary 112 (*) 70 - 99 mg/dL   Comment 1 Notify RN    CBC     Status: Abnormal   Collection Time    07/25/13  4:50 PM      Result Value Ref Range   WBC 13.7 (*) 4.0 - 10.5 K/uL   RBC 2.08 (*) 3.87 - 5.11 MIL/uL   Hemoglobin 7.0 (*) 12.0 - 15.0 g/dL   HCT 20.6 (*) 36.0 - 46.0 %   MCV 99.0  78.0 - 100.0 fL   MCH 33.7  26.0 - 34.0 pg   MCHC 34.0  30.0 - 36.0 g/dL   RDW 16.7 (*) 11.5 - 15.5 %   Platelets 359  150 - 400 K/uL  GLUCOSE, CAPILLARY     Status: Abnormal   Collection Time    07/25/13 10:19 PM      Result Value Ref Range   Glucose-Capillary 59 (*) 70 - 99 mg/dL  GLUCOSE, CAPILLARY     Status: Abnormal   Collection Time    07/26/13  6:56 AM      Result Value Ref Range   Glucose-Capillary 45 (*) 70 - 99 mg/dL   GLUCOSE, CAPILLARY     Status: Abnormal   Collection Time    07/26/13  7:10 AM      Result Value Ref Range   Glucose-Capillary 62 (*) 70 - 99 mg/dL  GLUCOSE, CAPILLARY     Status: None   Collection Time    07/26/13  7:26 AM      Result Value Ref Range   Glucose-Capillary 87  70 - 99 mg/dL   Comment 1 Notify RN    GLUCOSE, CAPILLARY     Status: Abnormal   Collection Time    07/26/13 12:00 PM      Result Value Ref Range   Glucose-Capillary 131 (*) 70 - 99 mg/dL   Comment 1 Notify RN    GLUCOSE, CAPILLARY     Status: Abnormal   Collection Time    07/26/13  4:51 PM      Result Value Ref Range   Glucose-Capillary 224 (*) 70 - 99 mg/dL  GLUCOSE, CAPILLARY     Status: Abnormal   Collection Time    07/26/13  9:29 PM      Result Value Ref Range   Glucose-Capillary 183 (*) 70 - 99 mg/dL  GLUCOSE, CAPILLARY     Status: None   Collection Time    07/27/13  7:25 AM      Result Value Ref Range   Glucose-Capillary 88  70 - 99 mg/dL   Comment 1 Notify RN        Nursing note and vitals reviewed.  Constitutional: She is oriented to person, place, and time. She appears well-developed and well-nourished. She is sleeping. She is easily aroused.  HENT:  Head: Normocephalic and atraumatic.  Eyes: Conjunctivae are normal. Pupils are equal, round, and reactive to light.  Neck: Normal range of motion. Neck supple.  Cardiovascular: Normal rate and regular rhythm.  Respiratory: Effort normal and breath sounds normal. No respiratory distress. She has no wheezes.  GI: Soft. Bowel sounds are normal. She exhibits no distension. There is no tenderness.  Musculoskeletal: She exhibits no edema, back is tender.  Neurological: She is oriented to person, place, and time and easily aroused.  Speech clear. Follows commands without difficulty. Diffuse weakness--BUE/LLE>RLE. Bilateral hands with thenar atrophy and contracture with decrease in fine motor movements. Numbness stocking-glove distribution  persists Skin: Skin is warm and dry.  Mid thoracic back incision intact. 4 minus bilateral deltoid, bicep, tricep 3 minus bilateral finger flexors and extensors  2 minus bilateral hip flexors 3 minus bilaterally knee extensors 2 minus bilateral ankle dorsiflexors and plantar flexors   Assessment/Plan: 1. Functional deficits secondary to T11 lesion with paraplegia which require 3+ hours per day of interdisciplinary therapy in a comprehensive inpatient rehab setting. Physiatrist is providing close team supervision and 24 hour management of active medical problems listed below. Physiatrist and rehab team continue to assess barriers to discharge/monitor patient progress toward functional and medical goals. FIM: FIM - Bathing Bathing Steps Patient Completed: Chest;Right Arm;Left Arm;Abdomen;Front perineal area;Right upper leg;Left upper leg Bathing: 3: Mod-Patient completes 5-7 9f10 parts or 50-74%  FIM - Upper Body Dressing/Undressing Upper body dressing/undressing steps patient completed: Thread/unthread right sleeve of pullover shirt/dresss;Thread/unthread left sleeve of pullover shirt/dress Upper body dressing/undressing: 3: Mod-Patient completed 50-74% of tasks FIM - Lower Body Dressing/Undressing Lower body dressing/undressing steps patient completed: Thread/unthread left underwear leg;Thread/unthread left pants leg Lower body dressing/undressing: 1: Total-Patient completed less than 25% of tasks  FIM - Toileting Toileting steps completed by patient: Adjust clothing prior to toileting;Performs perineal hygiene;Adjust clothing after toileting Toileting Assistive Devices: Grab bar or rail for support Toileting: 1: Total-Patient completed zero steps, helper did all 3  FIM - TRadio producerDevices: Grab bars Toilet Transfers: 3-To toilet/BSC: Mod A (lift or lower assist)  FIM - BControl and instrumentation engineerDevices: Arm rests;Bed  rails Bed/Chair Transfer: 3: Sit > Supine: Mod A (lifting assist/Pt. 50-74%/lift 2 legs);3: Supine > Sit: Mod A (lifting assist/Pt. 50-74%/lift 2 legs;3: Chair or W/C > Bed: Mod A (lift or lower assist);3: Bed > Chair or W/C: Mod A (lift or lower assist)  FIM - Locomotion: Wheelchair Distance: 60 Locomotion: Wheelchair: 1: Travels less than 50 ft with moderate assistance (Pt: 50 - 74%) FIM - Locomotion: Ambulation Locomotion: Ambulation Assistive Devices: WAdministratorAmbulation/Gait Assistance: 3: Mod assist Locomotion: Ambulation: 1: Travels less than 50 ft with moderate assistance (Pt: 50 - 74%)  Comprehension Comprehension Mode: Auditory Comprehension: 4-Understands basic 75 - 89% of the time/requires cueing 10 - 24% of the time  Expression Expression Mode: Verbal Expression: 4-Expresses basic 75 - 89% of the time/requires cueing 10 - 24% of the time. Needs helper to occlude trach/needs to repeat words.  Social Interaction Social Interaction: 4-Interacts appropriately 75 - 89% of the time - Needs redirection for appropriate language or to initiate interaction.  Problem Solving Problem Solving: 3-Solves basic 50 - 74% of the time/requires cueing 25 - 49% of the time  Memory Memory: 3-Recognizes or recalls 50 - 74% of the time/requires cueing 25 - 49% of the time  Medical Problem List and Plan:  1. 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  2. DVT Prophylaxis/Anticoagulation: Mechanical: Antiembolism stockings, thigh (TED hose) Bilateral lower extremities  Sequential compression devices, below knee Bilateral lower extremities  3. Pain  Management: Will continue vicodin prn as this is effective.   -increased baclofen to 42m q8 4. H/o depression/Mood: Resume lexapro. LCSW to follow for evaluation and support.  5. Neuropsych: This patient is capable of making decisions on her own behalf.  6.  ESRD: Continue HD on TTS after therapy sessions.  7. DM type 2 with peripheral neuropathy:  Continue lantus insulin with SSI for elevated BS. Sugars improving---backed down to 20 units lantus hs given hypoglycemia   -needs to eat consistently, HS snack    8. CAD: Continue plavix, lipitor, lopressor and ranexa.  9. Leucocytosis: Monitor for signs of infection with close watch on temp curve as well as wound.  10. HTN:  norvasc  534mdaily.    -if needed Consider clonidine patch. Prn po clonidine in place.  11. Acute on chronic anemia: On aranesp. Transfusion with dialysis prn.    LOS (Days) 8 A FACE TO FACE EVALUATION WAS PERFORMED  SWARTZ,ZACHARY T 07/27/2013, 8:01 AM

## 2013-07-28 ENCOUNTER — Inpatient Hospital Stay (HOSPITAL_COMMUNITY): Payer: Medicare Other

## 2013-07-28 DIAGNOSIS — G8220 Paraplegia, unspecified: Secondary | ICD-10-CM

## 2013-07-28 LAB — GLUCOSE, CAPILLARY
GLUCOSE-CAPILLARY: 111 mg/dL — AB (ref 70–99)
Glucose-Capillary: 87 mg/dL (ref 70–99)
Glucose-Capillary: 143 mg/dL — ABNORMAL HIGH (ref 70–99)
Glucose-Capillary: 154 mg/dL — ABNORMAL HIGH (ref 70–99)

## 2013-07-28 LAB — IRON AND TIBC
Iron: 31 ug/dL — ABNORMAL LOW (ref 42–135)
Iron: 31 ug/dL — ABNORMAL LOW (ref 42–135)
Saturation Ratios: 16 % — ABNORMAL LOW (ref 20–55)
Saturation Ratios: 15 % — ABNORMAL LOW (ref 20–55)
TIBC: 196 ug/dL — ABNORMAL LOW (ref 250–470)
TIBC: 213 ug/dL — ABNORMAL LOW (ref 250–470)
UIBC: 165 ug/dL (ref 125–400)
UIBC: 182 ug/dL (ref 125–400)

## 2013-07-28 LAB — CBC
HCT: 26.1 % — ABNORMAL LOW (ref 36.0–46.0)
HEMOGLOBIN: 8.5 g/dL — AB (ref 12.0–15.0)
MCH: 32.7 pg (ref 26.0–34.0)
MCHC: 32.6 g/dL (ref 30.0–36.0)
MCV: 100.4 fL — AB (ref 78.0–100.0)
Platelets: 389 10*3/uL (ref 150–400)
RBC: 2.6 MIL/uL — AB (ref 3.87–5.11)
RDW: 17 % — ABNORMAL HIGH (ref 11.5–15.5)
WBC: 10.4 10*3/uL (ref 4.0–10.5)

## 2013-07-28 NOTE — Progress Notes (Signed)
Patient refusing to have vital signs taken by NT.  Patient combative with NT.  States, "I want to go home."  Becomes quiet when everyone leaves her room.  Unable to contact daughter Elmo Putt.

## 2013-07-28 NOTE — Progress Notes (Signed)
Patient ID: Holly Ellison, female   DOB: 1941/02/07, 73 y.o.   MRN: [id number] 73 y.o. female with history of DM type 2 with neuropathy, CAD, COPD, ESRD, HA, who was admitted on 07/16/13 pm with one week history of BLE, fecal incontinence and severe back pain. 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 Vertell Limber. Frozen section appeared benign and NS questions old or indolent osteomyelitis. Patient with history of MSSA bacteremia 07/2012 s/p 4 weeks of antibiotic therapy. CT chest without evidence of malignancy. HD ongoing and labs with evidence of ABLA with down to 7.1 as well as leucocytosis with WBC-16.0.    Subjective/Complaints: "why are they keeping me here? i want to go home"--long night, anxiety per RN  Review of Systems   All other systems reviewed and are negative.  Objective: Vital Signs: Blood pressure 128/75, pulse 74, temperature 98 F (36.7 C), temperature source Oral, resp. rate 16, height 5' 6.14" (1.68 m), weight 69.2 kg (152 lb 8.9 oz), SpO2 95.00%. No results found. Results for orders placed during the hospital encounter of 07/19/13 (from the past 72 hour(s))  GLUCOSE, CAPILLARY     Status: Abnormal   Collection Time    07/25/13  7:52 AM      Result Value Ref Range   Glucose-Capillary 64 (*) 70 - 99 mg/dL   Comment 1 Notify RN    GLUCOSE, CAPILLARY     Status: None   Collection Time    07/25/13  8:12 AM      Result Value Ref Range   Glucose-Capillary 72  70 - 99 mg/dL   Comment 1 Notify RN    GLUCOSE, CAPILLARY     Status: Abnormal   Collection Time    07/25/13 11:27 AM      Result Value Ref Range   Glucose-Capillary 112 (*) 70 - 99 mg/dL   Comment 1 Notify RN    CBC     Status: Abnormal   Collection Time    07/25/13  4:50 PM      Result Value Ref Range   WBC 13.7 (*) 4.0 - 10.5 K/uL   RBC 2.08 (*) 3.87  - 5.11 MIL/uL   Hemoglobin 7.0 (*) 12.0 - 15.0 g/dL   HCT 20.6 (*) 36.0 - 46.0 %   MCV 99.0  78.0 - 100.0 fL   MCH 33.7  26.0 - 34.0 pg   MCHC 34.0  30.0 - 36.0 g/dL   RDW 16.7 (*) 11.5 - 15.5 %   Platelets 359  150 - 400 K/uL  GLUCOSE, CAPILLARY     Status: Abnormal   Collection Time    07/25/13 10:19 PM      Result Value Ref Range   Glucose-Capillary 59 (*) 70 - 99 mg/dL  GLUCOSE, CAPILLARY     Status: Abnormal   Collection Time    07/26/13  6:56 AM      Result Value Ref Range   Glucose-Capillary 45 (*) 70 - 99 mg/dL  GLUCOSE, CAPILLARY     Status: Abnormal   Collection Time    07/26/13  7:10 AM      Result Value Ref Range   Glucose-Capillary 62 (*) 70 - 99 mg/dL  GLUCOSE, CAPILLARY     Status: None   Collection Time    07/26/13  7:26 AM      Result Value Ref Range  Glucose-Capillary 87  70 - 99 mg/dL   Comment 1 Notify RN    GLUCOSE, CAPILLARY     Status: Abnormal   Collection Time    07/26/13 12:00 PM      Result Value Ref Range   Glucose-Capillary 131 (*) 70 - 99 mg/dL   Comment 1 Notify RN    GLUCOSE, CAPILLARY     Status: Abnormal   Collection Time    07/26/13  4:51 PM      Result Value Ref Range   Glucose-Capillary 224 (*) 70 - 99 mg/dL  GLUCOSE, CAPILLARY     Status: Abnormal   Collection Time    07/26/13  9:29 PM      Result Value Ref Range   Glucose-Capillary 183 (*) 70 - 99 mg/dL  GLUCOSE, CAPILLARY     Status: None   Collection Time    07/27/13  7:25 AM      Result Value Ref Range   Glucose-Capillary 88  70 - 99 mg/dL   Comment 1 Notify RN    GLUCOSE, CAPILLARY     Status: Abnormal   Collection Time    07/27/13 11:18 AM      Result Value Ref Range   Glucose-Capillary 157 (*) 70 - 99 mg/dL   Comment 1 Notify RN    CBC     Status: Abnormal   Collection Time    07/27/13  4:45 PM      Result Value Ref Range   WBC 11.5 (*) 4.0 - 10.5 K/uL   RBC 2.26 (*) 3.87 - 5.11 MIL/uL   Hemoglobin 7.4 (*) 12.0 - 15.0 g/dL   HCT 22.5 (*) 36.0 - 46.0 %   MCV  99.6  78.0 - 100.0 fL   MCH 32.7  26.0 - 34.0 pg   MCHC 32.9  30.0 - 36.0 g/dL   RDW 16.9 (*) 11.5 - 15.5 %   Platelets 366  150 - 400 K/uL  IRON AND TIBC     Status: Abnormal   Collection Time    07/27/13  4:45 PM      Result Value Ref Range   Iron 31 (*) 42 - 135 ug/dL   TIBC 196 (*) 250 - 470 ug/dL   Saturation Ratios 16 (*) 20 - 55 %   UIBC 165  125 - 400 ug/dL   Comment: Performed at [facility]  RENAL FUNCTION PANEL     Status: Abnormal   Collection Time    07/27/13  4:49 PM      Result Value Ref Range   Sodium 133 (*) 137 - 147 mEq/L   Potassium 3.8  3.7 - 5.3 mEq/L   Chloride 93 (*) 96 - 112 mEq/L   CO2 28  19 - 32 mEq/L   Glucose, Bld 113 (*) 70 - 99 mg/dL   BUN 32 (*) 6 - 23 mg/dL   Creatinine, Ser 3.90 (*) 0.50 - 1.10 mg/dL   Calcium 9.8  8.4 - 10.5 mg/dL   Phosphorus 2.4  2.3 - 4.6 mg/dL   Albumin 2.7 (*) 3.5 - 5.2 g/dL   GFR calc non Af Amer 11 (*) >90 mL/min   GFR calc Af Amer 12 (*) >90 mL/min   Comment: (NOTE)     The eGFR has been calculated using the CKD EPI equation.     This calculation has not been validated in all clinical situations.     eGFR's persistently <90 mL/min signify possible Chronic Kidney  Disease.  TYPE AND SCREEN     Status: None   Collection Time    07/27/13  5:30 PM      Result Value Ref Range   ABO/RH(D) O POS     Antibody Screen POS     Sample Expiration 07/30/2013     DAT, IgG NEG     Antibody Identification ANTI M     Unit Number [id number]     Blood Component Type RED CELLS,LR     Unit division 00     Status of Unit ALLOCATED     Transfusion Status OK TO TRANSFUSE     Crossmatch Result COMPATIBLE     Unit Number [id number]     Blood Component Type RED CELLS,LR     Unit division 00     Status of Unit REL FROM [facility]     Transfusion Status DO NOT ISSUE FOR TRANSFUSION     Crossmatch Result INCOMPATIBLE     Unit Number [id number]     Blood Component Type RED CELLS,LR     Unit division 00     Status  of Unit REL FROM [facility]     Transfusion Status DO NOT ISSUE FOR TRANSFUSION     Crossmatch Result INCOMPATIBLE    PREPARE RBC (CROSSMATCH)     Status: None   Collection Time    07/27/13  5:30 PM      Result Value Ref Range   Order Confirmation ORDER PROCESSED BY BLOOD BANK    GLUCOSE, CAPILLARY     Status: Abnormal   Collection Time    07/27/13  8:11 PM      Result Value Ref Range   Glucose-Capillary 112 (*) 70 - 99 mg/dL   Comment 1 Notify RN    CBC     Status: Abnormal   Collection Time    07/28/13  3:35 AM      Result Value Ref Range   WBC 10.4  4.0 - 10.5 K/uL   RBC 2.60 (*) 3.87 - 5.11 MIL/uL   Hemoglobin 8.5 (*) 12.0 - 15.0 g/dL   HCT 26.1 (*) 36.0 - 46.0 %   MCV 100.4 (*) 78.0 - 100.0 fL   MCH 32.7  26.0 - 34.0 pg   MCHC 32.6  30.0 - 36.0 g/dL   RDW 17.0 (*) 11.5 - 15.5 %   Platelets 389  150 - 400 K/uL      Nursing note and vitals reviewed.  Constitutional: She is oriented to person, place, and time. She appears well-developed and well-nourished. She is sleeping. She is easily aroused.  HENT:  Head: Normocephalic and atraumatic.  Eyes: Conjunctivae are normal. Pupils are equal, round, and reactive to light.  Neck: Normal range of motion. Neck supple.  Cardiovascular: Normal rate and regular rhythm.  Respiratory: Effort normal and breath sounds normal. No respiratory distress. She has no wheezes.  GI: Soft. Bowel sounds are normal. She exhibits no distension. There is no tenderness.  Musculoskeletal: She exhibits no edema, back is tender.  Neurological: She is oriented to person, confused.  Speech clear. Follows commands without difficulty.  Bilateral hands with thenar atrophy and contracture with decrease in fine motor movements. Numbness stocking-glove distribution persists.  Skin: Skin is warm and dry.  Mid thoracic back incision intact without drainage. 4 minus bilateral deltoid, bicep, tricep 3 minus bilateral finger flexors and extensors  2 minus bilateral hip  flexors 3 minus bilaterally knee extensors 2 minus bilateral ankle dorsiflexors and plantar flexors  Psych: confused, anxious, paranoid  Assessment/Plan: 1. Functional deficits secondary to T11 lesion with paraplegia which require 3+ hours per day of interdisciplinary therapy in a comprehensive inpatient rehab setting. Physiatrist is providing close team supervision and 24 hour management of active medical problems listed below. Physiatrist and rehab team continue to assess barriers to discharge/monitor patient progress toward functional and medical goals. FIM: FIM - Bathing Bathing Steps Patient Completed: Chest;Right Arm;Left Arm;Abdomen;Front perineal area;Right upper leg;Left upper leg Bathing: 3: Mod-Patient completes 5-7 41f10 parts or 50-74%  FIM - Upper Body Dressing/Undressing Upper body dressing/undressing steps patient completed: Thread/unthread right sleeve of pullover shirt/dresss;Thread/unthread left sleeve of pullover shirt/dress Upper body dressing/undressing: 3: Mod-Patient completed 50-74% of tasks FIM - Lower Body Dressing/Undressing Lower body dressing/undressing steps patient completed: Thread/unthread left underwear leg;Thread/unthread left pants leg Lower body dressing/undressing: 1: Total-Patient completed less than 25% of tasks  FIM - Toileting Toileting steps completed by patient: Adjust clothing prior to toileting;Performs perineal hygiene;Adjust clothing after toileting Toileting Assistive Devices: Grab bar or rail for support Toileting: 0: Activity did not occur  FIM - TRadio producerDevices: Grab bars Toilet Transfers: 0-Activity did not occur  FIM - BControl and instrumentation engineerDevices: Arm rests;Bed rails Bed/Chair Transfer: 3: Sit > Supine: Mod A (lifting assist/Pt. 50-74%/lift 2 legs);3: Supine > Sit: Mod A (lifting assist/Pt. 50-74%/lift 2 legs;3: Chair or W/C > Bed: Mod A (lift or lower assist);3: Bed >  Chair or W/C: Mod A (lift or lower assist)  FIM - Locomotion: Wheelchair Distance: 60 Locomotion: Wheelchair: 1: Travels less than 50 ft with moderate assistance (Pt: 50 - 74%) FIM - Locomotion: Ambulation Locomotion: Ambulation Assistive Devices: WAdministratorAmbulation/Gait Assistance: 3: Mod assist Locomotion: Ambulation: 1: Travels less than 50 ft with moderate assistance (Pt: 50 - 74%)  Comprehension Comprehension Mode: Auditory Comprehension: 3-Understands basic 50 - 74% of the time/requires cueing 25 - 50%  of the time  Expression Expression Mode: Verbal Expression: 3-Expresses basic 50 - 74% of the time/requires cueing 25 - 50% of the time. Needs to repeat parts of sentences.  Social Interaction Social Interaction: 3-Interacts appropriately 50 - 74% of the time - May be physically or verbally inappropriate.  Problem Solving Problem Solving: 3-Solves basic 50 - 74% of the time/requires cueing 25 - 49% of the time  Memory Memory: 2-Recognizes or recalls 25 - 49% of the time/requires cueing 51 - 75% of the time  Medical Problem List and Plan:  1. 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  2. DVT Prophylaxis/Anticoagulation: Mechanical: Antiembolism stockings, thigh (TED hose) Bilateral lower extremities  Sequential compression devices, below knee Bilateral lower extremities  3. Pain Management: Will continue vicodin prn as this is effective.   -increased baclofen to 114mq8 4. H/o depression/Mood: Resume lexapro. LCSW to follow for evaluation and support.  5. Neuropsych: This patient is not currently capable of making decisions on her own behalf.  6. ESRD: Continue HD on TTS after therapy sessions.  7. DM type 2 with peripheral neuropathy:  Continue lantus insulin with SSI for elevated BS. Sugars improving---backed down to 20 units lantus hs given hypoglycemia   -needs to eat  consistently, HS snack    8. CAD: Continue plavix, lipitor, lopressor and ranexa.  9. Leucocytosis: Monitor for signs of infection with close watch on temp curve as well as wound.  10. HTN:  norvasc  30m3maily.    -if  needed Consider clonidine patch. Prn po clonidine in place.  11. Acute on chronic anemia: On aranesp. Transfusion with dialysis prn.  12. AMS: more prominent after HD. Likely med effect as well. RN noticed some mild confusion yesterday  -dc baclofen, hold vicodin for now   LOS (Days) 9 A FACE TO FACE EVALUATION WAS PERFORMED  SWARTZ,ZACHARY T 07/28/2013, 7:45 AM

## 2013-07-28 NOTE — Progress Notes (Signed)
Patient tearful/irritable this morning shift. Patient reporting "I wan to go home." Patient confused this morning-reported "I didn't go to dialysis last night." Unable to talk rationally with patient. Dr Naaman Plummer aware.  Patient son in room this afternoon and able to speak with Dr Jonnie Finner. New orders received. Patient more calm after lunch and appeared alert and oriented x4. Patient then became more irritable and tearful this evening before dinner. Continuing to provide emotional support to patient.

## 2013-07-28 NOTE — Progress Notes (Signed)
Subjective:  HD yesterday on schedule/ no cos but some confusion= does not remember HD yesterday/  Objective Vital signs in last 24 hours: Filed Vitals:   07/27/13 1946 07/27/13 2014 07/28/13 0500 07/28/13 0849  BP: 100/54 128/75  123/67  Pulse: 79 74  89  Temp: 98 F (36.7 C) 98 F (36.7 C)  98.5 F (36.9 C)  TempSrc: Oral Oral  Oral  Resp: 16 16  16   Height:      Weight: 70 kg (154 lb 5.2 oz)  69.2 kg (152 lb 8.9 oz)   SpO2: 97% 95%  93%   Weight change:  Physical Exam:  General: ALert OX2 NAD.Moves all extrem  ,  Resp: CTA bilat.  Cardio: RRR without murmur , rub or gallop  GI: + BS, soft and nontender / nondistended  Extremities: No pedal edema  Access: AVF @ LUA with + bruit    HD: [facility]  4h 2/2.25 Bath 73kg LUA AVF Heparin 4500  Aranesp 25 mcg q 4 weeks, last 6/9 Hectorol 4 ug TIW  Tsat 26%, ferr 1119, pth 286  Problem/Plan 1. AMS- Noted received Baclofen Prn 6/19, was dc today by Dr. Naaman Plummer,  and Vicodin/ Gabapentin dc /Noted ho Depression on Lexapro as before The pt's son who is in charge of all her medications for the last two years says she was not taking Lexapro and never has been. She was started on it at the time of admission. Have stopped the Lexapro, and would HOLD ALL PAIN MEDS, MUSCLE RELAXANTS, NEURONTIN,, anything that could effect mentation for the next few days. Use Tylenol only for pain. I had a long discussion with pt and her son about this.  2. 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  3. ESRD - HD on TTS @ [facility]. Will have lower EDW lower edw/ Labs pre hd 4. HTN/Volume - BP 123/67/ now below edw 73 at 69.2 kg this am / on Amlodipine 5 mg qd /Also on Metoprolol 50 mg bid; 5. AMS- hold Gabapentin/ ? From anemia also 6. Anemia - Hgb 7.1,>7.0 > 8.5 after 1 u PRBCs on hd yesterday/Aranesp 60 mcg.^ 139mcg / tfs- 16% wil  give Iron Load IV 7. 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 Nutrition - Alb down to 2.7, renal carb-mod diet, vitamin. Start breeze supplement 9. CAD - 7 stents, CABG, on Ranexa.  10. DM - insulin per primary.stable bs  Ernest Haber, PA-C [place] [phone] 07/28/2013,10:40 AM  LOS: 9 days   Pt seen, examined, agree w assess/plan as above with additions as indicated.  [name] MD pager [phone]    cell [phone] 07/28/2013, 2:54 PM     Labs: Basic Metabolic Panel:  Recent Labs Lab 07/25/13 0610 07/27/13 1649  NA 134* 133*  K 4.5 3.8  CL 93* 93*  CO2 29 28  GLUCOSE 68* 113*  BUN 50* 32*  CREATININE 4.89* 3.90*  CALCIUM 9.6 9.8  PHOS 3.0 2.4   Liver Function Tests:  Recent Labs Lab 07/25/13 0610 07/27/13 1649  ALBUMIN 2.7* 2.7*   CBC:  Recent Labs Lab 07/25/13 1650 07/27/13 1645 07/28/13 0335  WBC 13.7* 11.5* 10.4  HGB 7.0* 7.4* 8.5*  HCT 20.6* 22.5* 26.1*  MCV  99.0 99.6 100.4*  PLT 359 366 389  CBG:  Recent Labs Lab 07/26/13 2129 07/27/13 0725 07/27/13 1118 07/27/13 2011 07/28/13 0812  GLUCAP 183* 88 157* 112* 87  Medications:   . amLODipine  5 mg Oral Daily  . aspirin EC  81 mg Oral Daily  . atorvastatin  40 mg Oral q1800  . calcium carbonate  2 tablet Oral TID WC  . clopidogrel  75 mg Oral Daily  . [START ON 08/01/2013] darbepoetin (ARANESP) injection - DIALYSIS  100 mcg Intravenous Q Thu-HD  . docusate sodium  100 mg Oral BID  . escitalopram  10 mg Oral Daily  . feeding supplement (RESOURCE BREEZE)  1 Container Oral TID BM  . gabapentin  100 mg Oral TID  . insulin aspart  0-9 Units Subcutaneous TID WC  . insulin glargine  20 Units Subcutaneous Daily  . methimazole  10 mg Oral Daily  . metoprolol  50 mg Oral BID  . multivitamin  1 tablet Oral QHS  . pantoprazole  40 mg Oral Daily  . polyethylene glycol  17 g Oral Daily  .  ranolazine  500 mg Oral BID

## 2013-07-29 ENCOUNTER — Inpatient Hospital Stay (HOSPITAL_COMMUNITY): Payer: Medicare Other | Admitting: Physical Therapy

## 2013-07-29 ENCOUNTER — Encounter (HOSPITAL_COMMUNITY): Payer: Medicare Other

## 2013-07-29 ENCOUNTER — Inpatient Hospital Stay (HOSPITAL_COMMUNITY): Payer: Medicare Other

## 2013-07-29 DIAGNOSIS — G8220 Paraplegia, unspecified: Secondary | ICD-10-CM

## 2013-07-29 LAB — GLUCOSE, CAPILLARY
GLUCOSE-CAPILLARY: 146 mg/dL — AB (ref 70–99)
Glucose-Capillary: 93 mg/dL (ref 70–99)
Glucose-Capillary: 181 mg/dL — ABNORMAL HIGH (ref 70–99)
Glucose-Capillary: 144 mg/dL — ABNORMAL HIGH (ref 70–99)

## 2013-07-29 MED ORDER — LIDOCAINE 5 % EX PTCH
2.0000 | MEDICATED_PATCH | CUTANEOUS | Status: DC
  Administered 2013-07-29 – 2013-08-15 (×18): 2 via TRANSDERMAL
  Filled 2013-07-29 (×21): qty 2

## 2013-07-29 NOTE — Progress Notes (Signed)
[facility] ROUNDING NOTE   Subjective:   Interval History: no complaints  Objective:  Vital signs in last 24 hours:  Temp:  [97.9 F (36.6 C)-99.4 F (37.4 C)] 97.9 F (36.6 C) (06/22 0539) Pulse Rate:  [74-82] 82 (06/22 0731) Resp:  [16-18] 18 (06/22 0539) BP: (124-166)/(60-77) 166/60 mmHg (06/22 0731) SpO2:  [95 %-98 %] 97 % (06/22 0539) Weight:  [70.6 kg (155 lb 10.3 oz)] 70.6 kg (155 lb 10.3 oz) (06/22 0500)  Weight change: -2.2 kg (-4 lb 13.6 oz) Filed Weights   07/27/13 1946 07/28/13 0500 07/29/13 0500  Weight: 70 kg (154 lb 5.2 oz) 69.2 kg (152 lb 8.9 oz) 70.6 kg (155 lb 10.3 oz)    Intake/Output: I/O last 3 completed shifts: In: 120 [P.O.:120] Out: 3000 [Other:3000]   Intake/Output this shift:  Total I/O In: 120 [P.O.:120] Out: -   General: ALert OX2 NAD.Moves all extrem ,  Resp: CTA bilat.  Cardio: RRR without murmur , rub or gallop  GI: + BS, soft and nontender / nondistended  Extremities: No pedal edema  Access: AVF @ LUA with + bruit     Basic Metabolic Panel:  Recent Labs Lab 07/25/13 0610 07/27/13 1649  NA 134* 133*  K 4.5 3.8  CL 93* 93*  CO2 29 28  GLUCOSE 68* 113*  BUN 50* 32*  CREATININE 4.89* 3.90*  CALCIUM 9.6 9.8  PHOS 3.0 2.4    Liver Function Tests:  Recent Labs Lab 07/25/13 0610 07/27/13 1649  ALBUMIN 2.7* 2.7*   No results found for this basename: LIPASE, AMYLASE,  in the last 168 hours No results found for this basename: AMMONIA,  in the last 168 hours  CBC:  Recent Labs Lab 07/25/13 1650 07/27/13 1645 07/28/13 0335  WBC 13.7* 11.5* 10.4  HGB 7.0* 7.4* 8.5*  HCT 20.6* 22.5* 26.1*  MCV 99.0 99.6 100.4*  PLT 359 366 389    Cardiac Enzymes: No results found for this basename: CKTOTAL, CKMB, CKMBINDEX, TROPONINI,  in the last 168 hours  BNP: No components found with this basename: POCBNP,   CBG:  Recent Labs Lab 07/28/13 0812 07/28/13 1126 07/28/13 1726 07/28/13 2043 07/29/13 0719   GLUCAP 87 143* 111* 154* 93    Microbiology: Results for orders placed during the hospital encounter of 07/16/13  MRSA PCR SCREENING     Status: None   Collection Time    07/17/13  3:16 AM      Result Value Ref Range Status   MRSA by PCR NEGATIVE  NEGATIVE Final   Comment:            The GeneXpert MRSA Assay (FDA     approved for NASAL specimens     only), is one component of a     comprehensive MRSA colonization     surveillance program. It is not     intended to diagnose MRSA     infection nor to guide or     monitor treatment for     MRSA infections.  CULTURE, BLOOD (ROUTINE X 2)     Status: None   Collection Time    07/17/13 11:00 AM      Result Value Ref Range Status   Specimen Description BLOOD LEFT HAND   Final   Special Requests BOTTLES DRAWN AEROBIC ONLY 3CC   Final   Culture  Setup Time     Final   Value: 07/17/2013 16:30     Performed at [facility]  Culture     Final   Value: NO GROWTH 5 DAYS     Performed at [facility]   Report Status 07/23/2013 FINAL   Final  CULTURE, BLOOD (ROUTINE X 2)     Status: None   Collection Time    07/17/13 11:10 AM      Result Value Ref Range Status   Specimen Description BLOOD THUMB LEFT   Final   Special Requests BOTTLES DRAWN AEROBIC ONLY [place]   Final   Culture  Setup Time     Final   Value: 07/17/2013 16:29     Performed at [facility]   Culture     Final   Value: NO GROWTH 5 DAYS     Performed at [facility]   Report Status 07/23/2013 FINAL   Final    Coagulation Studies: No results found for this basename: LABPROT, INR,  in the last 72 hours  Urinalysis: No results found for this basename: COLORURINE, APPERANCEUR, LABSPEC, PHURINE, GLUCOSEU, HGBUR, BILIRUBINUR, KETONESUR, PROTEINUR, UROBILINOGEN, NITRITE, LEUKOCYTESUR,  in the last 72 hours    Imaging: No results found.   Medications:     . amLODipine  5 mg Oral Daily  . aspirin EC  81 mg Oral Daily  . atorvastatin   40 mg Oral q1800  . clopidogrel  75 mg Oral Daily  . [START ON 08/01/2013] darbepoetin (ARANESP) injection - DIALYSIS  100 mcg Intravenous Q Thu-HD  . docusate sodium  100 mg Oral BID  . feeding supplement (RESOURCE BREEZE)  1 Container Oral TID BM  . insulin aspart  0-9 Units Subcutaneous TID WC  . insulin glargine  20 Units Subcutaneous Daily  . lidocaine  2 patch Transdermal Q24H  . methimazole  10 mg Oral Daily  . metoprolol  50 mg Oral BID  . multivitamin  1 tablet Oral QHS  . pantoprazole  40 mg Oral Daily  . polyethylene glycol  17 g Oral Daily  . ranolazine  500 mg Oral BID   acetaminophen, albuterol, bisacodyl, calcium carbonate, cloNIDine, dextrose, dextrose, menthol-cetylpyridinium, ondansetron (ZOFRAN) IV, phenol, polyethylene glycol  Assessment/ Plan:  73 y.o. female with history of DM type 2 with neuropathy, CAD, COPD, ESRD, HA, who was admitted on 07/16/13 pm with one week history of BLE, fecal incontinence and severe back pain. 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 Vertell Limber. Frozen section appeared benign and NS questions old or indolent osteomyelitis     ESRD   TTS  HTN/volume controlled   Anemia hold off on IV Iron  Transfused 20 th June  Bones stable  Calcium 9.8 off hectoral   CAD stable  Diabetes controlled by insulin  AMS improved stopped neurontin  Hyperthyroid  On tapizole  Recommend TSH  Assess need for continued therapy as an outpatient   LOS: 10 WEBB,MARTIN W @TODAY @10 :56 AM

## 2013-07-29 NOTE — Progress Notes (Signed)
I have seen and examined this patient and agree with the plan of care  [facility] W 07/29/2013, 12:10 PM

## 2013-07-29 NOTE — Progress Notes (Signed)
Physical Therapy Session Note  Patient Details  Name: Holly Ellison MRN: [id number] Date of Birth: 08-21-1940  Today's Date: 07/29/2013 Time: 0900-1000 and 1445-1530  Time Calculation (min): 60 min and 45 min  Short Term Goals: Week 2:  PT Short Term Goal 1 (Week 2): Pt to perform bed mobility w/ min A and use of hospital bed functions PT Short Term Goal 2 (Week 2): Pt to perform bed<>w/c transfers w/ min A and  min cueing for technique.  PT Short Term Goal 3 (Week 2): Pt will be able to propel the wheelchair 50' w/ mod A in controlled environment PT Short Term Goal 4 (Week 2): Pt to transfer sit<>stand w/ min A and B UE support PT Short Term Goal 5 (Week 2): Pt will be able to ambulate 20-25 feet using the LRAD with mod A   Skilled Therapeutic Interventions/Progress Updates:   AM Session: Pt received sidelying in bed, asleep. Aroused after multiple verbal attempts to awaken, pt startled. Pt reporting throughout session that back is "aching, not hurting," with heat pack and lidocaine patch pt reports pain is "easing off a little." Supine to sit with bed rail and HOB raised to sitting position and scooting to EOB with max-total A. Therapist threading LEs through pants for LB dressing sitting EOB. Attempted standing from EOB with bed rail and therapist under pt's arm with pt unable to correct forward flexed posture in order for therapist to pull up pants in standing. While waiting for NT, pt tolerated sitting EOB with BUE support and supervision x 5 min. Able to stand with +2 assist to pull up pants and perform stand pivot transfer bed > w/c. W/c mobility x 60 ft room > RN station with min A for steering/propulsion and vc's for technique, multiple rests. Pt performed 3 trials of gait x 15-20 ft with progressively improved upright posture in parallel bars with min A. Pt performed LAQ x 10 each LE during rest breaks. Pt left sitting in w/c with heat pack applied and all needs within reach. No evidence of  confusion this AM.   PM Session: Pt received sitting in w/c, agreeable to therapy. Gait training using RW 2 x 20 ft and mod A with w/c follow for safety x1 and without follow x1, max vc's for upright posture and to prevent LEs from dragging behind. Pt performed sit <> stand from slightly raised mat table using RW x 10 with prolonged rest between attempts, vc's for technique and upright posture, progressing from mod A to close S. In standing with RW for BUE support, pt attempted alternating LE taps to 4" step 2 reps x 2 sets with min A, requiring seated rest break between and stating, "I can't do it." Squat pivot transfer mat > w/c with mod A, pt propelled w/c x 25 ft with mod A to return to room. Pt left sitting in w/c with heat pack applied and all needs within reach.  Therapy Documentation Precautions:  Precautions Precautions: Back;Fall Precaution Comments: Fistula L arm Required Braces or Orthoses: Spinal Brace Restrictions Weight Bearing Restrictions: No Pain: Pain Assessment Pain Assessment: 0-10 Pain Score: 10-Worst pain ever Pain Type: Acute pain Pain Location: Back Pain Orientation: Mid Pain Descriptors / Indicators: Aching;Sore Pain Frequency: Intermittent Pain Onset: With Activity Patients Stated Pain Goal: 3 Pain Intervention(s): RN made aware;Repositioned  See FIM for current functional status  Therapy/Group: Individual Therapy  Laretta Alstrom 07/29/2013, 10:06 AM

## 2013-07-29 NOTE — Progress Notes (Signed)
Patient tearful and refusing to take 2000 meds.  States, "My son told me not to take anymore medications."  This RN attempted to explain to patient that certain meds had be stopped and the patient would not be receiving those medications.  Still patient refused to take meds. This RN asked patient to call son to see if she should take Lopressor.  States, "He works at night."  Patient refused to take all 2000 meds.

## 2013-07-29 NOTE — Progress Notes (Signed)
Chaplain visited patient on morning rounds. Patient seemed a little uncertain, if she was having a good morning. After patient and Chaplain exchanged eye contact and a smile, she seemed to cheer up. Patient reported to have a successful morning rehab. Patient reported having back surgery and was said to have started walking again in rehab. When patient mentioned her family, her mood seemed even more positive. Patient also shared her motivation for recovery. Overall, patient seemed welcoming and receptive to the visit. Possible follow up.

## 2013-07-29 NOTE — Progress Notes (Signed)
Patient ID: Holly Ellison, female   DOB: 04-28-1940, 73 y.o.   MRN: [id number] 73 y.o. female with history of DM type 2 with neuropathy, CAD, COPD, ESRD, HA, who was admitted on 07/16/13 pm with one week history of BLE, fecal incontinence and severe back pain. 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 Vertell Limber. Frozen section appeared benign and NS questions old or indolent osteomyelitis. Patient with history of MSSA bacteremia 07/2012 s/p 4 weeks of antibiotic therapy. CT chest without evidence of malignancy. HD ongoing and labs with evidence of ABLA with down to 7.1 as well as leucocytosis with WBC-16.0.    Subjective/Complaints: Back is sore. Had a better night. Less confusion  Review of Systems   All other systems reviewed and are negative.  Objective: Vital Signs: Blood pressure 166/60, pulse 82, temperature 97.9 F (36.6 C), temperature source Oral, resp. rate 18, height 5' 6.14" (1.68 m), weight 70.6 kg (155 lb 10.3 oz), SpO2 97.00%. No results found. Results for orders placed during the hospital encounter of 07/19/13 (from the past 72 hour(s))  GLUCOSE, CAPILLARY     Status: Abnormal   Collection Time    07/26/13 12:00 PM      Result Value Ref Range   Glucose-Capillary 131 (*) 70 - 99 mg/dL   Comment 1 Notify RN    GLUCOSE, CAPILLARY     Status: Abnormal   Collection Time    07/26/13  4:51 PM      Result Value Ref Range   Glucose-Capillary 224 (*) 70 - 99 mg/dL  GLUCOSE, CAPILLARY     Status: Abnormal   Collection Time    07/26/13  9:29 PM      Result Value Ref Range   Glucose-Capillary 183 (*) 70 - 99 mg/dL  GLUCOSE, CAPILLARY     Status: None   Collection Time    07/27/13  7:25 AM      Result Value Ref Range   Glucose-Capillary 88  70 - 99 mg/dL   Comment 1 Notify RN    GLUCOSE, CAPILLARY     Status: Abnormal   Collection Time    07/27/13 11:18 AM      Result Value Ref Range   Glucose-Capillary 157 (*) 70 - 99 mg/dL   Comment 1 Notify RN    CBC     Status: Abnormal   Collection Time    07/27/13  4:45 PM      Result Value Ref Range   WBC 11.5 (*) 4.0 - 10.5 K/uL   RBC 2.26 (*) 3.87 - 5.11 MIL/uL   Hemoglobin 7.4 (*) 12.0 - 15.0 g/dL   HCT 22.5 (*) 36.0 - 46.0 %   MCV 99.6  78.0 - 100.0 fL   MCH 32.7  26.0 - 34.0 pg   MCHC 32.9  30.0 - 36.0 g/dL   RDW 16.9 (*) 11.5 - 15.5 %   Platelets 366  150 - 400 K/uL  IRON AND TIBC     Status: Abnormal   Collection Time    07/27/13  4:45 PM      Result Value Ref Range   Iron 31 (*) 42 - 135 ug/dL   TIBC 196 (*) 250 - 470 ug/dL   Saturation Ratios 16 (*) 20 - 55 %   UIBC 165  125 - 400 ug/dL   Comment: Performed at [facility]  RENAL FUNCTION PANEL     Status: Abnormal   Collection Time    07/27/13  4:49 PM      Result Value Ref Range   Sodium 133 (*) 137 - 147 mEq/L   Potassium 3.8  3.7 - 5.3 mEq/L   Chloride 93 (*) 96 - 112 mEq/L   CO2 28  19 - 32 mEq/L   Glucose, Bld 113 (*) 70 - 99 mg/dL   BUN 32 (*) 6 - 23 mg/dL   Creatinine, Ser 3.90 (*) 0.50 - 1.10 mg/dL   Calcium 9.8  8.4 - 10.5 mg/dL   Phosphorus 2.4  2.3 - 4.6 mg/dL   Albumin 2.7 (*) 3.5 - 5.2 g/dL   GFR calc non Af Amer 11 (*) >90 mL/min   GFR calc Af Amer 12 (*) >90 mL/min   Comment: (NOTE)     The eGFR has been calculated using the CKD EPI equation.     This calculation has not been validated in all clinical situations.     eGFR's persistently <90 mL/min signify possible Chronic Kidney     Disease.  TYPE AND SCREEN     Status: None   Collection Time    07/27/13  5:30 PM      Result Value Ref Range   ABO/RH(D) O POS     Antibody Screen POS     Sample Expiration 07/30/2013     DAT, IgG NEG     Antibody Identification ANTI M     Unit Number [id number]     Blood Component Type RED CELLS,LR     Unit division 00     Status of Unit ALLOCATED     Transfusion  Status OK TO TRANSFUSE     Crossmatch Result COMPATIBLE     Donor AG Type NEGATIVE FOR M ANTIGEN     Unit Number [id number]     Blood Component Type RED CELLS,LR     Unit division 00     Status of Unit REL FROM [facility]     Transfusion Status DO NOT ISSUE FOR TRANSFUSION     Crossmatch Result INCOMPATIBLE     Unit Number [id number]     Blood Component Type RED CELLS,LR     Unit division 00     Status of Unit REL FROM [facility]     Transfusion Status DO NOT ISSUE FOR TRANSFUSION     Crossmatch Result INCOMPATIBLE    PREPARE RBC (CROSSMATCH)     Status: None   Collection Time    07/27/13  5:30 PM      Result Value Ref Range   Order Confirmation ORDER PROCESSED BY BLOOD BANK    GLUCOSE, CAPILLARY     Status: Abnormal   Collection Time    07/27/13  8:11 PM      Result Value Ref Range   Glucose-Capillary 112 (*) 70 - 99 mg/dL   Comment 1 Notify RN    CBC     Status: Abnormal   Collection Time    07/28/13  3:35 AM      Result Value Ref Range   WBC 10.4  4.0 - 10.5 K/uL   RBC 2.60 (*) 3.87 - 5.11 MIL/uL   Hemoglobin 8.5 (*) 12.0 - 15.0 g/dL   HCT 26.1 (*) 36.0 - 46.0 %   MCV 100.4 (*) 78.0 - 100.0 fL   MCH 32.7  26.0 - 34.0 pg   MCHC 32.6  30.0 - 36.0 g/dL   RDW 17.0 (*)  11.5 - 15.5 %   Platelets 389  150 - 400 K/uL  IRON AND TIBC     Status: Abnormal   Collection Time    07/28/13  3:35 AM      Result Value Ref Range   Iron 31 (*) 42 - 135 ug/dL   TIBC 213 (*) 250 - 470 ug/dL   Saturation Ratios 15 (*) 20 - 55 %   UIBC 182  125 - 400 ug/dL   Comment: Performed at [place], CAPILLARY     Status: None   Collection Time    07/28/13  8:12 AM      Result Value Ref Range   Glucose-Capillary 87  70 - 99 mg/dL   Comment 1 Notify RN     Comment 2 Documented in Chart    GLUCOSE, CAPILLARY     Status: Abnormal   Collection Time    07/28/13 11:26 AM      Result Value Ref Range   Glucose-Capillary 143 (*) 70 - 99 mg/dL   Comment 1 Notify RN     Comment 2  Documented in Chart    GLUCOSE, CAPILLARY     Status: Abnormal   Collection Time    07/28/13  5:26 PM      Result Value Ref Range   Glucose-Capillary 111 (*) 70 - 99 mg/dL   Comment 1 Documented in Chart    GLUCOSE, CAPILLARY     Status: Abnormal   Collection Time    07/28/13  8:43 PM      Result Value Ref Range   Glucose-Capillary 154 (*) 70 - 99 mg/dL   Comment 1 Notify RN    GLUCOSE, CAPILLARY     Status: None   Collection Time    07/29/13  7:19 AM      Result Value Ref Range   Glucose-Capillary 93  70 - 99 mg/dL      Nursing note and vitals reviewed.  Constitutional: She is oriented to person, place, and time. She appears well-developed and well-nourished. She is sleeping. She is easily aroused.  HENT:  Head: Normocephalic and atraumatic.  Eyes: Conjunctivae are normal. Pupils are equal, round, and reactive to light.  Neck: Normal range of motion. Neck supple.  Cardiovascular: Normal rate and regular rhythm.  Respiratory: Effort normal and breath sounds normal. No respiratory distress. She has no wheezes.  GI: Soft. Bowel sounds are normal. She exhibits no distension. There is no tenderness.  Musculoskeletal: She exhibits no edema, back remains tender.  Neurological: She is oriented to person, place, time.  Speech clear. Follows commands without difficulty.  Bilateral hands with thenar atrophy and contracture with decrease in fine motor movements. Numbness stocking-glove distribution persists.  Skin: Skin is warm and dry.  Mid thoracic back incision intact   4 minus bilateral deltoid, bicep, tricep 3 minus bilateral finger flexors and extensors  2 minus bilateral hip flexors 3 minus bilaterally knee extensors 2 minus bilateral ankle dorsiflexors and plantar flexors Psych: confused, anxious, paranoid  Assessment/Plan: 1. Functional deficits secondary to T11 lesion with paraplegia which require 3+ hours per day of interdisciplinary therapy in a comprehensive inpatient rehab  setting. Physiatrist is providing close team supervision and 24 hour management of active medical problems listed below. Physiatrist and rehab team continue to assess barriers to discharge/monitor patient progress toward functional and medical goals. FIM: FIM - Bathing Bathing Steps Patient Completed: Chest;Right Arm;Left Arm;Abdomen;Front perineal area;Right upper leg;Left upper leg Bathing: 1: Total-Patient  completes 0-2 of 10 parts or less than 25%  FIM - Upper Body Dressing/Undressing Upper body dressing/undressing steps patient completed: Thread/unthread right sleeve of pullover shirt/dresss;Thread/unthread left sleeve of pullover shirt/dress;Put head through opening of pull over shirt/dress Upper body dressing/undressing: 4: Min-Patient completed 75 plus % of tasks FIM - Lower Body Dressing/Undressing Lower body dressing/undressing steps patient completed: Thread/unthread left underwear leg;Thread/unthread left pants leg Lower body dressing/undressing: 1: Total-Patient completed less than 25% of tasks  FIM - Toileting Toileting steps completed by patient: Adjust clothing prior to toileting;Performs perineal hygiene;Adjust clothing after toileting Toileting Assistive Devices: Grab bar or rail for support Toileting: 0: Activity did not occur  FIM - Radio producer Devices: Grab bars Toilet Transfers: 0-Activity did not occur  FIM - Control and instrumentation engineer Devices: Arm rests;Bed rails Bed/Chair Transfer: 3: Sit > Supine: Mod A (lifting assist/Pt. 50-74%/lift 2 legs);3: Supine > Sit: Mod A (lifting assist/Pt. 50-74%/lift 2 legs;3: Chair or W/C > Bed: Mod A (lift or lower assist);3: Bed > Chair or W/C: Mod A (lift or lower assist)  FIM - Locomotion: Wheelchair Distance: 60 Locomotion: Wheelchair: 1: Travels less than 50 ft with moderate assistance (Pt: 50 - 74%) FIM - Locomotion: Ambulation Locomotion: Ambulation Assistive Devices:  Administrator Ambulation/Gait Assistance: 3: Mod assist Locomotion: Ambulation: 1: Travels less than 50 ft with moderate assistance (Pt: 50 - 74%)  Comprehension Comprehension Mode: Auditory Comprehension: 3-Understands basic 50 - 74% of the time/requires cueing 25 - 50%  of the time  Expression Expression Mode: Verbal Expression: 3-Expresses basic 50 - 74% of the time/requires cueing 25 - 50% of the time. Needs to repeat parts of sentences.  Social Interaction Social Interaction: 3-Interacts appropriately 50 - 74% of the time - May be physically or verbally inappropriate.  Problem Solving Problem Solving: 3-Solves basic 50 - 74% of the time/requires cueing 25 - 49% of the time  Memory Memory: 2-Recognizes or recalls 25 - 49% of the time/requires cueing 51 - 75% of the time  Medical Problem List and Plan:  1. 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  2. DVT Prophylaxis/Anticoagulation: Mechanical: Antiembolism stockings, thigh (TED hose) Bilateral lower extremities  Sequential compression devices, below knee Bilateral lower extremities  3. Pain Management: lidoderm patches for back.   -utilize, tylenol, ice, heat---limited options per below 4. H/o depression/Mood: Resume lexapro. LCSW to follow for evaluation and support.  5. Neuropsych: This patient is not currently capable of making decisions on her own behalf.  6. ESRD: Continue HD on TTS after therapy sessions.  7. DM type 2 with peripheral neuropathy:  Continue lantus insulin with SSI for elevated BS. Sugars improving---backed down to 20 units lantus hs given hypoglycemia   -needs to eat consistently, HS snack    8. CAD: Continue plavix, lipitor, lopressor and ranexa.  9. Leucocytosis: Monitor for signs of infection with close watch on temp curve as well as wound.  10. HTN:  norvasc  70m daily.    -if needed Consider clonidine patch. Prn  po clonidine in place.  11. Acute on chronic anemia: On aranesp. Transfusion with dialysis prn.  12. AMS: much improved today  -discussed at length with patient. She understands that she's sensitive to meds.   -will need to be conservative with pain measures.   LOS (Days) 10 A FACE TO FACE EVALUATION WAS PERFORMED  SWARTZ,ZACHARY T 07/29/2013, 7:47 AM

## 2013-07-29 NOTE — Progress Notes (Signed)
Occupational Therapy Session Note  Patient Details  Name: Holly Ellison MRN: [id number] Date of Birth: 03-20-1940  Today's Date: 07/29/2013  Session 1 Time: 0800-0830 Time Calculation (min): 30 min  Short Term Goals: Week 1:  OT Short Term Goal 1 (Week 1): Pt. will feed self with AE prn with no assist OT Short Term Goal 2 (Week 1): Pt. will bath LB with AE with mod assist OT Short Term Goal 3 (Week 1): pt. will stand for LB bathing with mod assist OT Short Term Goal 4 (Week 1): Pt. will transfer to dropped arm cc with mod assist  Skilled Therapeutic Interventions/Progress Updates:    Pt resting in bed upon arrival with grimace on face.  When questioned patient stated that her back was hurting.  Pt reports that transitional movements escalate pain level.  Repositioned patient in bed.  Attempted to engage patient in BADLs at bed level since patient declined sitting EOB. Pt unable to actively participate in BADLs secondary to pain.  Checked back with patient X 2 but pain remained limiting factor. Focus on bed mobility and activity tolerance.  Therapy Documentation Precautions:  Precautions Precautions: Back;Fall Precaution Comments: Fistula L arm Required Braces or Orthoses: Spinal Brace Restrictions Weight Bearing Restrictions: No General: General Amount of Missed OT Time (min): 30 Minutes; pain; RN aware and repositioned in bed; RN applied Lidocaine to back during session Pain: Pain Assessment Pain Assessment: 0-10 Pain Score: 10-Worst pain ever Pain Type: Acute pain Pain Location: Back Pain Orientation: Mid Pain Descriptors / Indicators: Aching;Sore Pain Frequency: Intermittent Pain Onset: With Activity Patients Stated Pain Goal: 3 Pain Intervention(s): RN made aware;Repositioned  See FIM for current functional status  Therapy/Group: Individual Therapy  Session 2 Time: 8329-1916 Pt c/o 4/10 pain in mid back; RN aware and heat applied Individual Therapy  Pt resting  in w/c upon arrival and agreeable to therapy.  Pt engaged in sit<>stand X 3 (mod A, max A, and min A) and standing tolerance at hi-lo table in therapy gym. Pt c/o dizziness when standing but BP at 147/80. Pt able to stand for approx 30 secs before requiring rest break.  Pt states that her legs "just get wobbly." Focus on activity tolerance, sit<>stand, and standing balance.  Leotis Shames [facility] 07/29/2013, 8:54 AM

## 2013-07-29 NOTE — Progress Notes (Signed)
Subjective:  Comfortable in chair, no complaints  Objective: Vital signs in last 24 hours: Temp:  [97.9 F (36.6 C)-99.4 F (37.4 C)] 97.9 F (36.6 C) (06/22 0539) Pulse Rate:  [74-82] 82 (06/22 0731) Resp:  [16-18] 18 (06/22 0539) BP: (124-166)/(60-77) 166/60 mmHg (06/22 0731) SpO2:  [95 %-98 %] 97 % (06/22 0539) Weight:  [70.6 kg (155 lb 10.3 oz)] 70.6 kg (155 lb 10.3 oz) (06/22 0500) Weight change: -2.2 kg (-4 lb 13.6 oz)  Intake/Output from previous day: 06/21 0701 - 06/22 0700 In: 120 [P.O.:120] Out: -  Intake/Output this shift: Total I/O In: 120 [P.O.:120] Out: -   Lab Results:  Recent Labs  07/27/13 1645 07/28/13 0335  WBC 11.5* 10.4  HGB 7.4* 8.5*  HCT 22.5* 26.1*  PLT 366 389   BMET:  Recent Labs  07/27/13 1649  NA 133*  K 3.8  CL 93*  CO2 28  GLUCOSE 113*  BUN 32*  CREATININE 3.90*  CALCIUM 9.8  ALBUMIN 2.7*   No results found for this basename: PTH,  in the last 72 hours Iron Studies:  Recent Labs  07/28/13 0335  IRON 31*  TIBC 213*   Studies/Results: No results found.  EXAM:  General appearance: Alert, in no apparent distress Resp: CTA without rales, rhonchi, or wheezes  Cardio: RRR without murmur or rub  GI: + BS, soft and nontender  Extremities: No edema  Access: AVF @ LUA with + bruit   HD: [facility]  4h 2/2.25 Bath 73kg LUA AVF Heparin 4500  Aranesp 25 mcg q 4 weeks, last 6/9 Hectorol 4 ug TIW  Tsat 26%, ferr 1119, pth 286  Assessment/Plan: 1. Recent AMS - now resolved, believed to be sec to meds; now only Tylenol for pain: Lexapro, which was started this admission, now dc'd. 2. 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  3. ESRD - HD on TTS @ [facility].  Next HD tomorrow.  4. HTN/Volume - BP 166/60 on Amlodipine 5 mg qd, Metoprolol 50 mg bid; wt 70.6 kg, below EDW.  5. Anemia - Hgb up to 8.5  s/p 1 U PRBCs 6/20, Aranesp 100 mcg on Thurs.  6. Sec HPT - Ca 9.8 (10.8 corrected), P 2.4; Hectorol 4 mcg on hold, Tums with meals.  7. Nutrition - Alb down to 2.7, renal carb-mod diet, vitamin.  8. CAD - 7 stents, CABG, on Ranexa.  9. DM - insulin per primary.     LOS: 10 days   Ellison,Holly 07/29/2013,10:15 AM

## 2013-07-30 ENCOUNTER — Inpatient Hospital Stay (HOSPITAL_COMMUNITY): Payer: Medicare Other

## 2013-07-30 ENCOUNTER — Inpatient Hospital Stay (HOSPITAL_COMMUNITY): Payer: Medicare Other | Admitting: Occupational Therapy

## 2013-07-30 DIAGNOSIS — G8220 Paraplegia, unspecified: Secondary | ICD-10-CM

## 2013-07-30 LAB — CBC
HEMATOCRIT: 21.8 % — AB (ref 36.0–46.0)
Hemoglobin: 7.2 g/dL — ABNORMAL LOW (ref 12.0–15.0)
MCH: 32 pg (ref 26.0–34.0)
MCHC: 33 g/dL (ref 30.0–36.0)
MCV: 96.9 fL (ref 78.0–100.0)
Platelets: 358 10*3/uL (ref 150–400)
RBC: 2.25 MIL/uL — ABNORMAL LOW (ref 3.87–5.11)
RDW: 16.4 % — ABNORMAL HIGH (ref 11.5–15.5)
WBC: 11.7 10*3/uL — ABNORMAL HIGH (ref 4.0–10.5)

## 2013-07-30 LAB — RENAL FUNCTION PANEL
Albumin: 2.7 g/dL — ABNORMAL LOW (ref 3.5–5.2)
BUN: 46 mg/dL — AB (ref 6–23)
CO2: 26 mEq/L (ref 19–32)
Calcium: 9 mg/dL (ref 8.4–10.5)
Chloride: 90 mEq/L — ABNORMAL LOW (ref 96–112)
Creatinine, Ser: 5.76 mg/dL — ABNORMAL HIGH (ref 0.50–1.10)
GFR calc Af Amer: 8 mL/min — ABNORMAL LOW (ref >90)
GFR calc non Af Amer: 7 mL/min — ABNORMAL LOW (ref >90)
GLUCOSE: 129 mg/dL — AB (ref 70–99)
PHOSPHORUS: 4.3 mg/dL (ref 2.3–4.6)
POTASSIUM: 4.1 meq/L (ref 3.7–5.3)
Sodium: 132 mEq/L — ABNORMAL LOW (ref 137–147)

## 2013-07-30 LAB — GLUCOSE, CAPILLARY
GLUCOSE-CAPILLARY: 183 mg/dL — AB (ref 70–99)
GLUCOSE-CAPILLARY: 94 mg/dL (ref 70–99)
Glucose-Capillary: 107 mg/dL — ABNORMAL HIGH (ref 70–99)
Glucose-Capillary: 136 mg/dL — ABNORMAL HIGH (ref 70–99)

## 2013-07-30 MED ORDER — ACETAMINOPHEN 325 MG PO TABS
ORAL_TABLET | ORAL | Status: AC
  Filled 2013-07-30: qty 2

## 2013-07-30 MED ORDER — BISACODYL 10 MG RE SUPP
10.0000 mg | Freq: Every day | RECTAL | Status: DC
  Administered 2013-07-31 – 2013-08-15 (×14): 10 mg via RECTAL
  Filled 2013-07-30 (×16): qty 1

## 2013-07-30 NOTE — Progress Notes (Signed)
Physical Therapy Session Note  Patient Details  Name: Holly Ellison MRN: [id number] Date of Birth: 07-Jul-1940  Today's Date: 07/30/2013 Time: Treatment Session 1: 8841-6606; Treatment Session 2: 3016-0109 Time Calculation (min): Treatment Session 1: 45 min; Treatment Session 2: 39min  Short Term Goals: Week 2:  PT Short Term Goal 1 (Week 2): Pt to perform bed mobility w/ min A and use of hospital bed functions PT Short Term Goal 2 (Week 2): Pt to perform bed<>w/c transfers w/ min A and  min cueing for technique.  PT Short Term Goal 3 (Week 2): Pt will be able to propel the wheelchair 50' w/ mod A in controlled environment PT Short Term Goal 4 (Week 2): Pt to transfer sit<>stand w/ min A and B UE support PT Short Term Goal 5 (Week 2): Pt will be able to ambulate 20-25 feet using the LRAD with mod A   Skilled Therapeutic Interventions/Progress Updates:  Treatment Session 1:  1:1. Pt received sitting supine in bed, RN in room. PT taking over. Focus this session on functional mobility and tolerance to therapy. Pt req min A for B rolling and total A for LB dressing. Pt req overall mod A for t/f sup<>sit w/ use of hospital bed functions as well as squat pivot t/f bed<>w/c<>NuStep due to pain, req significantly increased time during transitional movements due to pain/spasms and for problem solving/seq of transfers. Pt able to briefly push w/c w/ B UE and engage in NuStep level 1x5min, but poor tolerance due to pain. Pt assisted back to bed at end of session w/ all needs in reach, RN aware of pain.   Treatment Session 2:  1:1. Pt received semi-reclined in bed, awake and verbalized feeling ready for therapy just fearful of increased pain. Pt agreeable to begin with sidelying/supine therex to target B LE strengthening, exercises included: B clam shells, resisted hip flexion/extension, resisted hip abd/add and glute sets w/ good tolerance. Progression to bed mobility and transfers as tolerated, pt stating  "please just go slow! I don't want pain like that anymore!" Pt req min A for B rolling and mod A for t/f sup>sitx2 w/ use of hospital bed functions. Immediately upon sitting up each time, pt crying out in pain and very tearful. Despite attempt to get pt to scoot up towards [facility] for improved positioning, pt immediately lying down w/ uncontrolled descent of trunk. Ax2 persons for improved comfort in R sidelying, left w/ all needs in reach and bed alarm on. RN aware of continued pain.   Therapy Documentation Precautions:  Precautions Precautions: Back;Fall Precaution Comments: Fistula L arm Required Braces or Orthoses: Spinal Brace Restrictions Weight Bearing Restrictions: No  Pain: Pain Assessment Pain Assessment: Faces Faces Pain Scale: Hurts whole lot Pain Type: Acute pain Pain Location: Back Pain Orientation: Mid Pain Descriptors / Indicators: Aching;Spasm Patients Stated Pain Goal: 3 Pain Intervention(s): RN made aware;Repositioned;Rest  See FIM for current functional status  Therapy/Group: Individual Therapy  Gilmore Laroche 07/30/2013, 12:30 PM

## 2013-07-30 NOTE — Progress Notes (Signed)
Initially patient unable to state where on her foot I was touching, started at ankle then moved hand towards top of foot then patient was able to state that she felt touch.  Mal Amabile, RN

## 2013-07-30 NOTE — Discharge Summary (Signed)
Physician Discharge Summary  Patient ID: Holly Ellison MRN: [id number] DOB/AGE: Dec 05, 1940 73 y.o.  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  ACT shows lytic lesion of T 11 and Thoracic MRI without gadolinium today demonstrates destructive lesion of T 11 with epidural cord compression at T 10/11. Patient transferred to [facility] for treatment. She was taken to Neuro OR emergently for decompression of spinal cord and biopsy.  Following uncomplicated laminectomy and decompression, she recovered well in Neuro PACU, Neuro ICU, and transferred to med-surg renal 6th floor.  Therapies recommended CIR and pt d/c'ed from neurosurgical service to inpatient rehab on June 12th.      Consults: None  Significant Diagnostic Studies: radiology: X-Ray: intra-operative  Treatments: surgery: THORACIC LAMINECTOMY FOR TUMOR (N/A) with decompression of thoracic spinal cord   Discharge Exam: Blood pressure 179/68, pulse 68, temperature 98.7 F (37.1 C), temperature source Oral, resp. rate 16, height 5\' 6"  (1.676 m), weight 76.3 kg (168 lb 3.4 oz), SpO2 93.00%. On June 12th visit, pt alert, conversant, reporting only incisional soreness. Site without erythema, swelling, or drainage. PT/OT recommending CIR.  Disposition: D/C to [facility]     Medication List    TAKE these medications       albuterol 108 (90 BASE) MCG/ACT inhaler  Commonly known as:   PROVENTIL HFA;VENTOLIN HFA  Inhale 2 puffs into the lungs every 6 (six) hours as needed for wheezing.     aspirin 325 MG EC tablet  Take 325 mg by mouth daily.     atorvastatin 40 MG tablet  Commonly known as:  LIPITOR  Take 40 mg by mouth at bedtime.     b complex-vitamin c-folic acid 0.8 MG Tabs tablet  Take 1 tablet by mouth daily.     calcium carbonate 750 MG chewable tablet  Commonly known as:  TUMS EX  Chew 1 tablet by mouth 3 (three) times daily.     clopidogrel 75 MG tablet  Commonly known as:  PLAVIX  Take 1 tablet (75 mg total) by mouth daily.     gabapentin 100 MG capsule  Commonly known as:  NEURONTIN  Take 100 mg by mouth 3 (three) times daily. Take 2 capsules in the morning and 3 capsules at bedtime     insulin aspart 100 unit/mL injection  Commonly known as:  novoLOG  Inject 0-4 Units into the skin 3 (three) times daily before meals. Sliding scale: No units if <120, 3 units if 120-150, 4 if 150-180     LANTUS SOLOSTAR 100 UNIT/ML Solostar Pen  Generic drug:  Insulin Glargine  Inject 28 Units into the skin every morning.     lidocaine 5 % ointment  Commonly known as:  XYLOCAINE  daily as needed for mild pain (due to dialysis).     methimazole 10 MG tablet  Commonly known as:  TAPAZOLE  Take 10 mg by mouth 3 (three) times daily.     metoprolol succinate 100 MG 24 hr tablet  Commonly known as:  TOPROL-XL  Take 100 mg by mouth daily. Take with or immediately following a meal.     pantoprazole 40 MG tablet  Commonly known as:  PROTONIX  Take 40 mg by mouth daily.     RANEXA 1000 MG SR tablet  Generic drug:  ranolazine  Take 1,000 mg by mouth 2 (two) times daily.     silver sulfADIAZINE 1 % cream  Commonly known as:  SILVADENE  Apply 1 application topically daily.      ASK your doctor about these medications       nitrofurantoin (macrocrystal-monohydrate) 100 MG capsule  Commonly known as:  MACROBID  Take 100 mg by mouth 2 (two) times daily.          Signed: Peggyann Shoals, MD 07/30/2013, 8:46 AM

## 2013-07-30 NOTE — Progress Notes (Signed)
Patient ID: Holly Ellison, female   DOB: 06-24-1940, 73 y.o.   MRN: [id number] 73 y.o. female with history of DM type 2 with neuropathy, CAD, COPD, ESRD, HA, who was admitted on 07/16/13 pm with one week history of BLE, fecal incontinence and severe back pain. 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 Vertell Limber. Frozen section appeared benign and NS questions old or indolent osteomyelitis. Patient with history of MSSA bacteremia 07/2012 s/p 4 weeks of antibiotic therapy. CT chest without evidence of malignancy. HD ongoing and labs with evidence of ABLA with down to 7.1 as well as leucocytosis with WBC-16.0.    Subjective/Complaints: Back is hurting. RN reports some confusion last night  Review of Systems   All other systems reviewed and are negative.  Objective: Vital Signs: Blood pressure 151/61, pulse 85, temperature 98.3 F (36.8 C), temperature source Oral, resp. rate 18, height 5' 6.14" (1.68 m), weight 72.8 kg (160 lb 7.9 oz), SpO2 94.00%. No results found. Results for orders placed during the hospital encounter of 07/19/13 (from the past 72 hour(s))  GLUCOSE, CAPILLARY     Status: Abnormal   Collection Time    07/27/13 11:18 AM      Result Value Ref Range   Glucose-Capillary 157 (*) 70 - 99 mg/dL   Comment 1 Notify RN    CBC     Status: Abnormal   Collection Time    07/27/13  4:45 PM      Result Value Ref Range   WBC 11.5 (*) 4.0 - 10.5 K/uL   RBC 2.26 (*) 3.87 - 5.11 MIL/uL   Hemoglobin 7.4 (*) 12.0 - 15.0 g/dL   HCT 22.5 (*) 36.0 - 46.0 %   MCV 99.6  78.0 - 100.0 fL   MCH 32.7  26.0 - 34.0 pg   MCHC 32.9  30.0 - 36.0 g/dL   RDW 16.9 (*) 11.5 - 15.5 %   Platelets 366  150 - 400 K/uL  IRON AND TIBC     Status: Abnormal   Collection Time    07/27/13  4:45 PM      Result Value Ref Range   Iron 31 (*) 42 - 135 ug/dL    TIBC 196 (*) 250 - 470 ug/dL   Saturation Ratios 16 (*) 20 - 55 %   UIBC 165  125 - 400 ug/dL   Comment: Performed at [facility]  RENAL FUNCTION PANEL     Status: Abnormal   Collection Time    07/27/13  4:49 PM      Result Value Ref Range   Sodium 133 (*) 137 - 147 mEq/L   Potassium 3.8  3.7 - 5.3 mEq/L   Chloride 93 (*) 96 - 112 mEq/L   CO2 28  19 - 32 mEq/L   Glucose, Bld 113 (*) 70 - 99 mg/dL   BUN 32 (*) 6 - 23 mg/dL   Creatinine, Ser 3.90 (*) 0.50 - 1.10 mg/dL   Calcium 9.8  8.4 - 10.5 mg/dL   Phosphorus 2.4  2.3 - 4.6 mg/dL   Albumin 2.7 (*) 3.5 - 5.2 g/dL   GFR calc non Af Amer 11 (*) >90 mL/min   GFR calc Af Amer 12 (*) >90 mL/min   Comment: (NOTE)     The eGFR has been calculated using the CKD EPI equation.     This  calculation has not been validated in all clinical situations.     eGFR's persistently <90 mL/min signify possible Chronic Kidney     Disease.  TYPE AND SCREEN     Status: None   Collection Time    07/27/13  5:30 PM      Result Value Ref Range   ABO/RH(D) O POS     Antibody Screen POS     Sample Expiration 07/30/2013     DAT, IgG NEG     Antibody Identification ANTI M     Unit Number [id number]     Blood Component Type RED CELLS,LR     Unit division 00     Status of Unit ALLOCATED     Transfusion Status OK TO TRANSFUSE     Crossmatch Result COMPATIBLE     Donor AG Type NEGATIVE FOR M ANTIGEN     Unit Number [id number]     Blood Component Type RED CELLS,LR     Unit division 00     Status of Unit REL FROM [facility]     Transfusion Status DO NOT ISSUE FOR TRANSFUSION     Crossmatch Result INCOMPATIBLE     Unit Number [id number]     Blood Component Type RED CELLS,LR     Unit division 00     Status of Unit REL FROM [facility]     Transfusion Status DO NOT ISSUE FOR TRANSFUSION     Crossmatch Result INCOMPATIBLE    PREPARE RBC (CROSSMATCH)     Status: None   Collection Time    07/27/13  5:30 PM      Result Value Ref Range   Order  Confirmation ORDER PROCESSED BY BLOOD BANK    GLUCOSE, CAPILLARY     Status: Abnormal   Collection Time    07/27/13  8:11 PM      Result Value Ref Range   Glucose-Capillary 112 (*) 70 - 99 mg/dL   Comment 1 Notify RN    CBC     Status: Abnormal   Collection Time    07/28/13  3:35 AM      Result Value Ref Range   WBC 10.4  4.0 - 10.5 K/uL   RBC 2.60 (*) 3.87 - 5.11 MIL/uL   Hemoglobin 8.5 (*) 12.0 - 15.0 g/dL   HCT 26.1 (*) 36.0 - 46.0 %   MCV 100.4 (*) 78.0 - 100.0 fL   MCH 32.7  26.0 - 34.0 pg   MCHC 32.6  30.0 - 36.0 g/dL   RDW 17.0 (*) 11.5 - 15.5 %   Platelets 389  150 - 400 K/uL  IRON AND TIBC     Status: Abnormal   Collection Time    07/28/13  3:35 AM      Result Value Ref Range   Iron 31 (*) 42 - 135 ug/dL   TIBC 213 (*) 250 - 470 ug/dL   Saturation Ratios 15 (*) 20 - 55 %   UIBC 182  125 - 400 ug/dL   Comment: Performed at [place], CAPILLARY     Status: None   Collection Time    07/28/13  8:12 AM      Result Value Ref Range   Glucose-Capillary 87  70 - 99 mg/dL   Comment 1 Notify RN     Comment 2 Documented in Chart    GLUCOSE, CAPILLARY     Status: Abnormal   Collection Time    07/28/13 11:26 AM  Result Value Ref Range   Glucose-Capillary 143 (*) 70 - 99 mg/dL   Comment 1 Notify RN     Comment 2 Documented in Chart    GLUCOSE, CAPILLARY     Status: Abnormal   Collection Time    07/28/13  5:26 PM      Result Value Ref Range   Glucose-Capillary 111 (*) 70 - 99 mg/dL   Comment 1 Documented in Chart    GLUCOSE, CAPILLARY     Status: Abnormal   Collection Time    07/28/13  8:43 PM      Result Value Ref Range   Glucose-Capillary 154 (*) 70 - 99 mg/dL   Comment 1 Notify RN    GLUCOSE, CAPILLARY     Status: None   Collection Time    07/29/13  7:19 AM      Result Value Ref Range   Glucose-Capillary 93  70 - 99 mg/dL  GLUCOSE, CAPILLARY     Status: Abnormal   Collection Time    07/29/13 11:17 AM      Result Value Ref Range    Glucose-Capillary 181 (*) 70 - 99 mg/dL   Comment 1 Notify RN    GLUCOSE, CAPILLARY     Status: Abnormal   Collection Time    07/29/13  4:47 PM      Result Value Ref Range   Glucose-Capillary 144 (*) 70 - 99 mg/dL  GLUCOSE, CAPILLARY     Status: Abnormal   Collection Time    07/29/13  9:41 PM      Result Value Ref Range   Glucose-Capillary 146 (*) 70 - 99 mg/dL  GLUCOSE, CAPILLARY     Status: Abnormal   Collection Time    07/30/13  7:09 AM      Result Value Ref Range   Glucose-Capillary 107 (*) 70 - 99 mg/dL      Nursing note and vitals reviewed.  Constitutional: She is oriented to person, place, and time. She appears well-developed and well-nourished. She is sleeping. She is easily aroused.  HENT:  Head: Normocephalic and atraumatic.  Eyes: Conjunctivae are normal. Pupils are equal, round, and reactive to light.  Neck: Normal range of motion. Neck supple.  Cardiovascular: Normal rate and regular rhythm.  Respiratory: Effort normal and breath sounds normal. No respiratory distress. She has no wheezes.  GI: Soft. Bowel sounds are normal. She exhibits no distension. There is no tenderness.  Musculoskeletal: She exhibits no edema, back remains tender.  Neurological: She is oriented to person, place, time.  Speech clear. Follows commands without difficulty.  Bilateral hands with thenar atrophy and contracture with decrease in fine motor movements. Numbness stocking-glove distribution persists.  Skin: Skin is warm and dry.  Mid thoracic back incision intact   4 minus bilateral deltoid, bicep, tricep 3 minus bilateral finger flexors and extensors  2 minus bilateral hip flexors 3 minus bilaterally knee extensors 2 minus bilateral ankle dorsiflexors and plantar flexors Psych: confused, anxious, paranoid  Assessment/Plan: 1. Functional deficits secondary to T11 lesion with paraplegia which require 3+ hours per day of interdisciplinary therapy in a comprehensive inpatient rehab  setting. Physiatrist is providing close team supervision and 24 hour management of active medical problems listed below. Physiatrist and rehab team continue to assess barriers to discharge/monitor patient progress toward functional and medical goals. FIM: FIM - Bathing Bathing Steps Patient Completed: Chest;Right Arm;Left Arm;Abdomen;Front perineal area;Right upper leg;Left upper leg Bathing: 1: Total-Patient completes 0-2 of 10 parts or less than  25%  FIM - Upper Body Dressing/Undressing Upper body dressing/undressing steps patient completed: Thread/unthread right sleeve of pullover shirt/dresss;Thread/unthread left sleeve of pullover shirt/dress;Put head through opening of pull over shirt/dress Upper body dressing/undressing: 4: Min-Patient completed 75 plus % of tasks FIM - Lower Body Dressing/Undressing Lower body dressing/undressing steps patient completed: Thread/unthread left underwear leg;Thread/unthread left pants leg Lower body dressing/undressing: 1: Total-Patient completed less than 25% of tasks  FIM - Toileting Toileting steps completed by patient: Adjust clothing prior to toileting;Performs perineal hygiene;Adjust clothing after toileting Toileting Assistive Devices: Grab bar or rail for support Toileting: 0: Activity did not occur  FIM - Radio producer Devices: Grab bars Toilet Transfers: 0-Activity did not occur  FIM - Control and instrumentation engineer Devices: Arm rests;Bed rails;HOB elevated Bed/Chair Transfer: 2: Supine > Sit: Max A (lifting assist/Pt. 25-49%);1: Two helpers  FIM - Locomotion: Wheelchair Distance: 60 Locomotion: Wheelchair: 2: Travels 50 - 149 ft with minimal assistance (Pt.>75%) FIM - Locomotion: Ambulation Locomotion: Ambulation Assistive Devices: Parallel bars;Walker - Rolling Ambulation/Gait Assistance: 4: Min assist;3: Mod assist Locomotion: Ambulation: 1: Travels less than 50 ft with moderate  assistance (Pt: 50 - 74%)  Comprehension Comprehension Mode: Auditory Comprehension: 3-Understands basic 50 - 74% of the time/requires cueing 25 - 50%  of the time  Expression Expression Mode: Verbal Expression: 3-Expresses basic 50 - 74% of the time/requires cueing 25 - 50% of the time. Needs to repeat parts of sentences.  Social Interaction Social Interaction: 3-Interacts appropriately 50 - 74% of the time - May be physically or verbally inappropriate.  Problem Solving Problem Solving: 3-Solves basic 50 - 74% of the time/requires cueing 25 - 49% of the time  Memory Memory: 2-Recognizes or recalls 25 - 49% of the time/requires cueing 51 - 75% of the time  Medical Problem List and Plan:  1. 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  2. DVT Prophylaxis/Anticoagulation: Mechanical: Antiembolism stockings, thigh (TED hose) Bilateral lower extremities  Sequential compression devices, below knee Bilateral lower extremities  3. Pain Management: lidoderm patches for back.   -utilize, tylenol, ice, heat--  4. H/o depression/Mood: Resume lexapro. LCSW to follow for evaluation and support.  5. Neuropsych: This patient is not currently capable of making decisions on her own behalf.  6. ESRD: Continue HD on TTS after therapy sessions.  7. DM type 2 with peripheral neuropathy:  Continue lantus insulin with SSI for elevated BS. Sugars improving---backed down to 20 units lantus hs given hypoglycemia   -needs to eat consistently, HS snack    8. CAD: Continue plavix, lipitor, lopressor and ranexa.  9. Leucocytosis: Monitor for signs of infection with close watch on temp curve as well as wound.  10. HTN:  norvasc  69m daily.    -if needed Consider clonidine patch. Prn po clonidine in place.  11. Acute on chronic anemia: On aranesp. Transfusion with dialysis prn.  12. AMS: much improved today   -still some hs  confusion (but less severe)  -will need to be conservative with pain measures.   LOS (Days) 11 A FACE TO FACE EVALUATION WAS PERFORMED  SWARTZ,ZACHARY T 07/30/2013, 7:43 AM

## 2013-07-30 NOTE — Progress Notes (Addendum)
Occupational Therapy Weekly Progress Note  Patient Details  Name: Holly Ellison MRN: [id number] Date of Birth: 25-Dec-1940  Beginning of progress report period: July 21, 2013 End of progress report period: July 30, 2013  Today's Date: 07/30/2013  Patient has met 1 of 4 short term goals.  Pt progress has been inconsistent since admission.  Pt continues to experience back spasms/pain with transitional movements which has limited patients active participation.  Pt performed shower transfer with mod A and completed bathing at shower level and dressing with sit<>stand from w/c at sink (6/16) but now requires mod/max A for bathing/dressing tasks and often is unable to complete tasks secondary to pain.  Pt requires mod verbal cues for task initiation and sequencing for bed mobility and sit<>stand.  Family has not been present for therapy sessions.  Patient continues to demonstrate the following deficits: abnormal posture, decreased sitting and standing balance, acute low back pain, muscle weakness (generalized), decreased cognition (sustained attention, memory, awareness, problem solving and sequencing-question id related related to pain vs. Medication.), decreased cardiorespiratory endurance, impaired sensation, and decreased coordination.  Therefore, patient will continue to benefit from skilled OT intervention to enhance overall performance with BADL and Reduce care partner burden.  Patient progressing slowly toward long term goals however, limited in progress due to pain/spasms. May need to downgrade LTGs.  Will continue to assess and collaborate with PT and rehab team.   For now, continue plan of care.  OT Short Term Goals Week 1:  OT Short Term Goal 1 (Week 1): Pt. will feed self with AE prn with no assist OT Short Term Goal 1 - Progress (Week 1): Met OT Short Term Goal 2 (Week 1): Pt. will bath LB with AE with mod assist OT Short Term Goal 2 - Progress (Week 1): Progressing toward goal OT Short  Term Goal 3 (Week 1): pt. will stand for LB bathing with mod assist OT Short Term Goal 3 - Progress (Week 1): Progressing toward goal OT Short Term Goal 4 (Week 1): Pt. will transfer to dropped arm cc with mod assist OT Short Term Goal 4 - Progress (Week 1): Progressing toward goal Week 2:  OT Short Term Goal 1 (Week 2): Pt will consistently transfer to [facility] with mod A OT Short Term Goal 2 (Week 2): Pt will stand with mod A to complete LB bathing tasks OT Short Term Goal 3 (Week 2): Pt will perform LB bathing with mod A using AE prn  Therapy Documentation Precautions:  Precautions Precautions: Back;Fall Precaution Comments: Fistula L arm Required Braces or Orthoses: Spinal Brace Restrictions Weight Bearing Restrictions: No  See FIM for current functional status  Leroy Libman 07/30/2013, 12:15 PM   Addendum:  Removed "Individual Therapy" which was entered in error.  Blanchard Mane, MS, OTR/L Occupational Therapist Pager [phone]

## 2013-07-30 NOTE — Progress Notes (Signed)
Friction tear on sacral area , skin protectant cream applied.

## 2013-07-30 NOTE — Progress Notes (Signed)
Occupational Therapy Session Note  Patient Details  Name: Holly Ellison MRN: [id number] Date of Birth: 1941-01-23  Today's Date: 07/30/2013 Time: 0800-0850 Time Calculation (min): 50 min, (missed 10 min due to pain and bowel issues)  Short Term Goals: Week 1:  OT Short Term Goal 1 (Week 1): Pt. will feed self with AE prn with no assist OT Short Term Goal 2 (Week 1): Pt. will bath LB with AE with mod assist OT Short Term Goal 3 (Week 1): pt. will stand for LB bathing with mod assist OT Short Term Goal 4 (Week 1): Pt. will transfer to dropped arm cc with mod assist  Skilled Therapeutic Interventions/Progress Updates:  Patient in bed upon arrival with RN applying 2 pain patches to right side of patient's mid-lower back.  Patient reports great pain and did not sleep much at all due to discomfort and pain last night.  Patient agreeable to attempt bed level bath and dressing.  During all bed mobility and basic functional mobility needed to accomplish the task of sponge bath and dressing, patient in excruciating pain that presented itself like spasms secondary to the pain would come on sudden and eventually ease off.  Provided education regarding need to stabilize her spine by engaging her abdominal muscles before each movement and to move slowly.    Patient unable to fully participate in therapy this am secondary to pain, sleepy, and numerous incontinent bowel episodes.  RN aware and intervention attempted.  Patient stating that she does not know that she is moving her bowels.  Therapy Documentation Precautions:  Precautions Precautions: Back;Fall Precaution Comments: Fistula L arm Required Braces or Orthoses: Spinal Brace Restrictions Weight Bearing Restrictions: No Pain: 8/10 in back, right side, described often as spasm.  Rest, repositioned, RN provide medication and pain patches ADL: See FIM for current functional status  Therapy/Group: Individual Therapy  SHAFFER,  CHRISTINA 07/30/2013, 11:42 AM

## 2013-07-30 NOTE — Progress Notes (Signed)
Occupational Therapy Note  Patient Details  Name: Holly Ellison MRN: [id number] Date of Birth: 1940-10-16 Today's Date: 07/30/2013  Time: 1300-1330 Pt denie c/o 10/10 pain with movement; RN aware Individual Therapy  Pt resting in bed with lunch tray sitting in front.  Pt stated she had no appetite but was agreeable to drinking Nepro.  Pt declined sitting EOB but engaged in bed mobility (rolling and scooting).  Pt required max A for rolling in bed.  Focus on activity tolerance and bed mobility.   Leotis Shames [facility] 07/30/2013, 3:03 PM

## 2013-07-30 NOTE — Plan of Care (Signed)
Problem: SCI BOWEL ELIMINATION Goal: RH STG MANAGE BOWEL WITH ASSISTANCE STG Manage Bowel with Min Assistance.  Outcome: Not Progressing Patient is unable to tell when she needs to eliminate. Could possibly requiring bowel program.

## 2013-07-30 NOTE — Progress Notes (Signed)
Patient voiced being tired, nor getting enough sleep due to discomfort. Patient repositioned in bed to left side with heating pad along left side of back.

## 2013-07-30 NOTE — Progress Notes (Unsigned)
Patient ID: Holly Ellison, female   DOB: 1940-12-09, 73 y.o.   MRN: [id number]

## 2013-07-30 NOTE — Progress Notes (Signed)
I have seen and examined this patient and agree with the plan of care  [facility] W 07/30/2013, 11:47 AM

## 2013-07-30 NOTE — Progress Notes (Signed)
Subjective:   Low back pain today, no other complaints  Objective: Vital signs in last 24 hours: Temp:  [98.2 F (36.8 C)-98.4 F (36.9 C)] 98.3 F (36.8 C) (06/23 0558) Pulse Rate:  [71-94] 85 (06/23 0700) Resp:  [18] 18 (06/23 0558) BP: (147-168)/(61-80) 151/61 mmHg (06/23 0700) SpO2:  [94 %-99 %] 94 % (06/23 0558) Weight:  [72.8 kg (160 lb 7.9 oz)] 72.8 kg (160 lb 7.9 oz) (06/23 0500) Weight change: 2.2 kg (4 lb 13.6 oz)  Intake/Output from previous day: 06/22 0701 - 06/23 0700 In: 240 [P.O.:240] Out: -    Lab Results:  Recent Labs  07/27/13 1645 07/28/13 0335  WBC 11.5* 10.4  HGB 7.4* 8.5*  HCT 22.5* 26.1*  PLT 366 389   BMET:  Recent Labs  07/27/13 1649  NA 133*  K 3.8  CL 93*  CO2 28  GLUCOSE 113*  BUN 32*  CREATININE 3.90*  CALCIUM 9.8  ALBUMIN 2.7*   No results found for this basename: PTH,  in the last 72 hours Iron Studies:  Recent Labs  07/28/13 0335  IRON 31*  TIBC 213*   Studies/Results: No results found.  EXAM:  General appearance: Alert, in no apparent distress  Resp: CTA without rales, rhonchi, or wheezes  Cardio: RRR without murmur or rub  GI: + BS, soft and nontender  Extremities: No edema  Access: AVF @ LUA with + bruit   HD: [facility]  4h 2/2.25 Bath 73kg LUA AVF Heparin 4500  Aranesp 25 mcg q 4 weeks, last 6/9 Hectorol 4 ug TIW  Tsat 26%, ferr 1119, pth 286  Assessment/Plan: 1. Recent AMS - now resolved, believed to be sec to meds; now only Tylenol for pain: Lexapro, which was started this admission, now dc'd. 2. 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  3. ESRD - HD on TTS @ [facility]. Next HD today.  4. HTN/Volume - BP 151/61 on Amlodipine 5 mg qd, Metoprolol 50 mg bid; wt 72.8 kg, @ EDW.  5. Anemia - Hgb up to 8.5 s/p 1 U PRBCs 6/20, Aranesp 100 mcg on Thurs.  6. 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  7. Nutrition - Alb down to 2.7, renal carb-mod diet, vitamin.  8. CAD - 7 stents, CABG, on Ranexa.  9. DM - insulin per primary.     LOS: 11 days   LYLES,CHARLES 07/30/2013,9:03 AM

## 2013-07-31 ENCOUNTER — Inpatient Hospital Stay (HOSPITAL_COMMUNITY): Payer: Medicare Other

## 2013-07-31 ENCOUNTER — Encounter (HOSPITAL_COMMUNITY): Payer: Medicare Other

## 2013-07-31 LAB — TYPE AND SCREEN
ABO/RH(D): O POS
ANTIBODY SCREEN: POSITIVE
DAT, IGG: NEGATIVE
Donor AG Type: NEGATIVE
UNIT DIVISION: 0
UNIT DIVISION: 0
Unit division: 0

## 2013-07-31 LAB — GLUCOSE, CAPILLARY
GLUCOSE-CAPILLARY: 108 mg/dL — AB (ref 70–99)
Glucose-Capillary: 149 mg/dL — ABNORMAL HIGH (ref 70–99)
Glucose-Capillary: 138 mg/dL — ABNORMAL HIGH (ref 70–99)
Glucose-Capillary: 66 mg/dL — ABNORMAL LOW (ref 70–99)
Glucose-Capillary: 99 mg/dL (ref 70–99)
Glucose-Capillary: 123 mg/dL — ABNORMAL HIGH (ref 70–99)

## 2013-07-31 MED ORDER — HYDROCODONE-ACETAMINOPHEN 5-325 MG PO TABS
0.5000 | ORAL_TABLET | Freq: Two times a day (BID) | ORAL | Status: DC
  Administered 2013-07-31 – 2013-08-06 (×13): 0.5 via ORAL
  Filled 2013-07-31 (×13): qty 1

## 2013-07-31 MED ORDER — TRAZODONE HCL 50 MG PO TABS
50.0000 mg | ORAL_TABLET | Freq: Every day | ORAL | Status: AC
  Administered 2013-07-31: 50 mg via ORAL
  Filled 2013-07-31: qty 1

## 2013-07-31 MED ORDER — HYDROCODONE-ACETAMINOPHEN 5-325 MG PO TABS
0.5000 | ORAL_TABLET | ORAL | Status: AC
  Administered 2013-07-31: 0.5 via ORAL
  Filled 2013-07-31: qty 1

## 2013-07-31 NOTE — Progress Notes (Signed)
Patient ID: Holly Ellison, female   DOB: 14-Jun-1940, 73 y.o.   MRN: [id number] 73 y.o. female with history of DM type 2 with neuropathy, CAD, COPD, ESRD, HA, who was admitted on 07/16/13 pm with one week history of BLE, fecal incontinence and severe back pain. 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 Vertell Limber. Frozen section appeared benign and NS questions old or indolent osteomyelitis. Patient with history of MSSA bacteremia 07/2012 s/p 4 weeks of antibiotic therapy. CT chest without evidence of malignancy. HD ongoing and labs with evidence of ABLA with down to 7.1 as well as leucocytosis with WBC-16.0.    Subjective/Complaints: Back still sore. Didn't sleep real well.   Review of Systems   All other systems reviewed and are negative.  Objective: Vital Signs: Blood pressure 187/73, pulse 69, temperature 98.2 F (36.8 C), temperature source Oral, resp. rate 19, height 5' 6.14" (1.68 m), weight 73.9 kg (162 lb 14.7 oz), SpO2 100.00%. No results found. Results for orders placed during the hospital encounter of 07/19/13 (from the past 72 hour(s))  GLUCOSE, CAPILLARY     Status: None   Collection Time    07/28/13  8:12 AM      Result Value Ref Range   Glucose-Capillary 87  70 - 99 mg/dL   Comment 1 Notify RN     Comment 2 Documented in Chart    GLUCOSE, CAPILLARY     Status: Abnormal   Collection Time    07/28/13 11:26 AM      Result Value Ref Range   Glucose-Capillary 143 (*) 70 - 99 mg/dL   Comment 1 Notify RN     Comment 2 Documented in Chart    GLUCOSE, CAPILLARY     Status: Abnormal   Collection Time    07/28/13  5:26 PM      Result Value Ref Range   Glucose-Capillary 111 (*) 70 - 99 mg/dL   Comment 1 Documented in Chart    GLUCOSE, CAPILLARY     Status: Abnormal   Collection Time    07/28/13  8:43 PM      Result Value  Ref Range   Glucose-Capillary 154 (*) 70 - 99 mg/dL   Comment 1 Notify RN    GLUCOSE, CAPILLARY     Status: None   Collection Time    07/29/13  7:19 AM      Result Value Ref Range   Glucose-Capillary 93  70 - 99 mg/dL  GLUCOSE, CAPILLARY     Status: Abnormal   Collection Time    07/29/13 11:17 AM      Result Value Ref Range   Glucose-Capillary 181 (*) 70 - 99 mg/dL   Comment 1 Notify RN    GLUCOSE, CAPILLARY     Status: Abnormal   Collection Time    07/29/13  4:47 PM      Result Value Ref Range   Glucose-Capillary 144 (*) 70 - 99 mg/dL  GLUCOSE, CAPILLARY     Status: Abnormal   Collection Time    07/29/13  9:41 PM      Result Value Ref Range   Glucose-Capillary 146 (*) 70 - 99 mg/dL  GLUCOSE, CAPILLARY     Status: Abnormal   Collection Time    07/30/13  7:09 AM      Result Value Ref Range   Glucose-Capillary 107 (*) 70 -  99 mg/dL  GLUCOSE, CAPILLARY     Status: Abnormal   Collection Time    07/30/13 11:12 AM      Result Value Ref Range   Glucose-Capillary 136 (*) 70 - 99 mg/dL  GLUCOSE, CAPILLARY     Status: Abnormal   Collection Time    07/30/13  4:38 PM      Result Value Ref Range   Glucose-Capillary 183 (*) 70 - 99 mg/dL  CBC     Status: Abnormal   Collection Time    07/30/13  6:00 PM      Result Value Ref Range   WBC 11.7 (*) 4.0 - 10.5 K/uL   RBC 2.25 (*) 3.87 - 5.11 MIL/uL   Hemoglobin 7.2 (*) 12.0 - 15.0 g/dL   HCT 21.8 (*) 36.0 - 46.0 %   MCV 96.9  78.0 - 100.0 fL   MCH 32.0  26.0 - 34.0 pg   MCHC 33.0  30.0 - 36.0 g/dL   RDW 16.4 (*) 11.5 - 15.5 %   Platelets 358  150 - 400 K/uL  RENAL FUNCTION PANEL     Status: Abnormal   Collection Time    07/30/13  6:00 PM      Result Value Ref Range   Sodium 132 (*) 137 - 147 mEq/L   Potassium 4.1  3.7 - 5.3 mEq/L   Chloride 90 (*) 96 - 112 mEq/L   CO2 26  19 - 32 mEq/L   Glucose, Bld 129 (*) 70 - 99 mg/dL   BUN 46 (*) 6 - 23 mg/dL   Creatinine, Ser 5.76 (*) 0.50 - 1.10 mg/dL   Calcium 9.0  8.4 - 10.5 mg/dL    Phosphorus 4.3  2.3 - 4.6 mg/dL   Albumin 2.7 (*) 3.5 - 5.2 g/dL   GFR calc non Af Amer 7 (*) >90 mL/min   GFR calc Af Amer 8 (*) >90 mL/min   Comment: (NOTE)     The eGFR has been calculated using the CKD EPI equation.     This calculation has not been validated in all clinical situations.     eGFR's persistently <90 mL/min signify possible Chronic Kidney     Disease.  GLUCOSE, CAPILLARY     Status: None   Collection Time    07/30/13 11:49 PM      Result Value Ref Range   Glucose-Capillary 94  70 - 99 mg/dL   Comment 1 Notify RN    GLUCOSE, CAPILLARY     Status: Abnormal   Collection Time    07/31/13  6:29 AM      Result Value Ref Range   Glucose-Capillary 149 (*) 70 - 99 mg/dL   Comment 1 Notify RN    GLUCOSE, CAPILLARY     Status: Abnormal   Collection Time    07/31/13  7:18 AM      Result Value Ref Range   Glucose-Capillary 108 (*) 70 - 99 mg/dL      Nursing note and vitals reviewed.  Constitutional: She is oriented to person, place, and time. She appears well-developed and well-nourished. She is sleeping. She is easily aroused.  HENT:  Head: Normocephalic and atraumatic.  Eyes: Conjunctivae are normal. Pupils are equal, round, and reactive to light.  Neck: Normal range of motion. Neck supple.  Cardiovascular: Normal rate and regular rhythm.  Respiratory: Effort normal and breath sounds normal. No respiratory distress. She has no wheezes.  GI: Soft. Bowel sounds are normal. She exhibits no  distension. There is no tenderness.  Musculoskeletal: She exhibits no edema, back remains tender.  Neurological: She is oriented to person, place, time.  Speech clear. Follows commands without difficulty.  Bilateral hands with thenar atrophy and contracture with decrease in fine motor movements. Numbness stocking-glove distribution persists.  Skin: Skin is warm and dry.  Mid thoracic back incision intact   4 minus bilateral deltoid, bicep, tricep 3 minus bilateral finger flexors  and extensors  2 minus bilateral hip flexors 3 minus bilaterally knee extensors 2 minus bilateral ankle dorsiflexors and plantar flexors Psych: confused, anxious, paranoid  Assessment/Plan: 1. Functional deficits secondary to T11 lesion with paraplegia which require 3+ hours per day of interdisciplinary therapy in a comprehensive inpatient rehab setting. Physiatrist is providing close team supervision and 24 hour management of active medical problems listed below. Physiatrist and rehab team continue to assess barriers to discharge/monitor patient progress toward functional and medical goals. FIM: FIM - Bathing Bathing Steps Patient Completed: Chest;Right Arm;Left Arm;Abdomen;Front perineal area (bed level) Bathing: 3: Mod-Patient completes 5-7 58f10 parts or 50-74% (bed level)  FIM - Upper Body Dressing/Undressing Upper body dressing/undressing steps patient completed: Thread/unthread right sleeve of pullover shirt/dresss;Thread/unthread left sleeve of pullover shirt/dress (bed level) Upper body dressing/undressing: 3: Mod-Patient completed 50-74% of tasks (bed level) FIM - Lower Body Dressing/Undressing Lower body dressing/undressing steps patient completed: Thread/unthread left underwear leg;Thread/unthread left pants leg Lower body dressing/undressing: 0: Activity did not occur (Bowel issues and too much back pain)  FIM - Toileting Toileting steps completed by patient: Adjust clothing prior to toileting;Performs perineal hygiene;Adjust clothing after toileting Toileting Assistive Devices: Grab bar or rail for support Toileting: 0: Activity did not occur  FIM - TRadio producerDevices: Grab bars Toilet Transfers: 0-Activity did not occur  FIM - BControl and instrumentation engineerDevices: Arm rests;Bed rails;HOB elevated Bed/Chair Transfer: 3: Sit > Supine: Mod A (lifting assist/Pt. 50-74%/lift 2 legs);3: Bed > Chair or W/C: Mod A (lift or  lower assist);3: Chair or W/C > Bed: Mod A (lift or lower assist);3: Supine > Sit: Mod A (lifting assist/Pt. 50-74%/lift 2 legs  FIM - Locomotion: Wheelchair Distance: 60 Locomotion: Wheelchair: 1: Travels less than 50 ft with supervision, cueing or coaxing FIM - Locomotion: Ambulation Locomotion: Ambulation Assistive Devices: Parallel bars;Walker - Rolling Ambulation/Gait Assistance: 4: Min assist;3: Mod assist Locomotion: Ambulation: 0: Activity did not occur  Comprehension Comprehension Mode: Auditory Comprehension: 5-Follows basic conversation/direction: With extra time/assistive device  Expression Expression Mode: Verbal Expression: 3-Expresses basic 50 - 74% of the time/requires cueing 25 - 50% of the time. Needs to repeat parts of sentences.  Social Interaction Social Interaction: 4-Interacts appropriately 75 - 89% of the time - Needs redirection for appropriate language or to initiate interaction.  Problem Solving Problem Solving: 3-Solves basic 50 - 74% of the time/requires cueing 25 - 49% of the time  Memory Memory: 4-Recognizes or recalls 75 - 89% of the time/requires cueing 10 - 24% of the time  Medical Problem List and Plan:  1. 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  2. DVT Prophylaxis/Anticoagulation: Mechanical: Antiembolism stockings, thigh (TED hose) Bilateral lower extremities  Sequential compression devices, below knee Bilateral lower extremities  3. Pain Management: lidoderm patches for back.   -utilize, tylenol, ice, heat--   -resumed LOW dose scheduled hydrocodone at 7am and 12 noon each day to coincide with therapy 4. H/o depression/Mood: Resume lexapro. LCSW to follow for evaluation  and support.  5. Neuropsych: This patient is not currently capable of making decisions on her own behalf.  6. ESRD: Continue HD on TTS after therapy sessions.  7. DM type 2 with peripheral  neuropathy:  Continue lantus insulin with SSI for elevated BS. Sugars improving---backed down to 20 units lantus hs given hypoglycemia   -numbers more balanced yesterday  8. CAD: Continue plavix, lipitor, lopressor and ranexa.  9. Leucocytosis: Monitor for signs of infection with close watch on temp curve as well as wound.  10. HTN:  norvasc  34m daily---increase to 136m   -  Prn po clonidine in place.  11. Acute on chronic anemia: On aranesp. Transfusion with dialysis prn.  12. AMS: much improved today   -still some hs confusion (but less severe)  -will need to be conservative with pain measures.   LOS (Days) 12 A FACE TO FACE EVALUATION WAS PERFORMED  SWARTZ,ZACHARY T 07/31/2013, 7:58 AM

## 2013-07-31 NOTE — Progress Notes (Deleted)
incontinent

## 2013-07-31 NOTE — Progress Notes (Signed)
Patient in discomfort when awakening and with movement in bed, provided repositioning, heat applied and medication, discussed patient's discomfort with son.

## 2013-07-31 NOTE — Progress Notes (Deleted)
High risk falls, back brace

## 2013-07-31 NOTE — Progress Notes (Signed)
Spoke with the patient's son on the phone, informed him of her pain and discomfort, that she was not able to tolerate sitting up in the chair. Discussed with him that her appetite  has decreased, son stated that he will bring in a grilled chicken sandwich.  Suggested to patient's son that it may be beneficial that he come and sit in on her therapy sessions for encouragement, support and to witness her limitations, son was agreeable. Oval Linsey, RN

## 2013-07-31 NOTE — Progress Notes (Signed)
Hypoglycemic Event  CBG: 66  Treatment: 15 GM carbohydrate snack  Symptoms: None  Follow-up CBG: Time: 1700 CBG Result: 99  Possible Reasons for Event: Inadequate meal intake  Comments/MD notified:  Patient stable. Called Dan A. PA.    Tonita Cong  Remember to initiate Hypoglycemia Order Set & complete

## 2013-07-31 NOTE — Patient Care Conference (Signed)
Inpatient RehabilitationTeam Conference and Plan of Care Update Date: 07/30/2013   Time: 3:15 PM    Patient Name: Holly Ellison      Medical Record Number: [id number]  Date of Birth: 1940-10-04 Sex: Female         Room/Bed: 4M11C/4M11C-01 Payor Info: Payor: MEDICARE / Plan: MEDICARE PART A AND B / Product Type: *No Product type* /    Admitting Diagnosis: T11 sp cord decomp   Admit Date/Time:  07/19/2013  5:07 PM Admission Comments: No comment available   Primary Diagnosis:  <principal problem not specified> Principal Problem: <principal problem not specified>  Patient Active Problem List   Diagnosis Date Noted  . Compression of spinal cord with myelopathy 07/19/2013  . Paraparesis 07/17/2013  . Bacteremia due to Staphylococcus aureus 07/28/2012  . HCAP (healthcare-associated pneumonia) 07/27/2012  . ESRD on dialysis 07/27/2012  . Chest pain 10/29/2011  . Hypertensive emergency 10/29/2011  . Bell's palsy 10/29/2011  . Hyperthyroidism 10/29/2011  . Pulmonary edema 06/05/2011  . 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 Peripheral neuropathy 06/05/2011  . Diabetes mellitus 06/05/2011  . Hypertension 06/05/2011  . COPD (chronic obstructive pulmonary disease) 06/05/2011  . Atherosclerosis of native arteries of the extremities with ulceration 05/25/2011    Expected Discharge Date: Expected Discharge Date: 08/08/13  Team Members Present:       Current Status/Progress Goal Weekly Team Focus  Medical   confusion---had to stop sedating meds---pain an issue  see prior  pain control. mental status   Bowel/Bladder   MOSTLY INCONTINENT OF BOPWEL AND BLADDER, PATIENT STATES UNABLE TO TELL WHEN NEEDING TO GO TO THE BATHROOM ALL THE TIME, CHECKED FOR IMPACTION, SOFT STOOL NOTED IN RECTAL VAULT, UBALE TO MOVE BOWELS.  CONSIDER BOWEL PROGRAM?  Patient to regain continence  Timed toileting patient q3h and prn   Swallow/Nutrition/ Hydration             ADL's   bathing/dressing-mod A/max A; transfers-mod A; decreased activity tolerance; some confusion  bathing/dressing-supervision; toilet transfer-suprevision; tub transfer-min A  activity tolerance, transfers, UE strength, safety awareness, pain management   Mobility   Close(S)-min A for sitting balance and w/c propulsion, Mod A for bed mobility, squat pivot transfers and short distance amb w/ RW  Supervision-Min A, combination of w/c and ambulation  functional endurance, tolerance, B LE strength, safety during functional mobility, transfers, bed mobility, ambulation, intellectual/emergent awareness   Communication             Safety/Cognition/ Behavioral Observations  No safety issues noted         Pain   C/o mid back pain; getting some relief with po tylenol and lidocaine patches  <5  Assess and treat for pain q shift and prn   Skin   FRICTION TEAR AT SACRUM, BARRIER CREAM APPLIED, INCISION TO BACK OPEN TO AIR ,  DERMABOND PEELING.   No skin infection or breakdown during rehab stay  Assess skin q shift and prn    Rehab Goals Patient on target to meet rehab goals: Yes *See Care Plan and progress notes for long and short-term goals.  Barriers to Discharge: cognition, see prior    Possible Resolutions to Barriers:  see prior, family ed    Discharge Planning/Teaching Needs:  home with son and daughter providing 24/7 assistance      Team Discussion:  Sensitivity to narcotics along with premorbid dementia appear to be affected progress with therapies.  Team feels first couple of days on CIR  were her best and since has declined cognitively and in physical endurance.  Decreased tolerance for activities. Pt cites pain as her primary barrier, however, MD must use pain meds sparingly because of their affect on her cogn/ phys functioning.  May need to downgrade goals.  MD to address pain carefully.  SW will follow up with pt and family to determine if d/c home is still appropriate as family will likely  have to provide more assistance than initially thought.  Revisions to Treatment Plan:  Possible downgrading of some goals and change of d/c plan   Continued Need for Acute Rehabilitation Level of Care: The patient requires daily medical management by a physician with specialized training in physical medicine and rehabilitation for the following conditions: Daily direction of a multidisciplinary physical rehabilitation program to ensure safe treatment while eliciting the highest outcome that is of practical value to the patient.: Yes Daily medical management of patient stability for increased activity during participation in an intensive rehabilitation regime.: Yes Daily analysis of laboratory values and/or radiology reports with any subsequent need for medication adjustment of medical intervention for : Post surgical problems;Neurological problems;Other  HOYLE, LUCY 07/31/2013, 6:42 AM

## 2013-07-31 NOTE — Plan of Care (Signed)
Problem: RH PAIN MANAGEMENT Goal: RH STG PAIN MANAGED AT OR BELOW PT'S PAIN GOAL Pain <2 on 0-10 scale with prn medications  Outcome: Not Progressing Patient pain is uncontrolled

## 2013-07-31 NOTE — Progress Notes (Signed)
Assisted patient roll in bed to change incontinent diaper.  Patient required min assist, mostly cues to perform bed mobility. Patient states "I think that pill is working."  Patient slid self up in bed with min assist and minimal pain.  Assisted patient back to side lying position (left side). Repositioned k-pad.  Call bell within reach.  Brita Romp, RN

## 2013-07-31 NOTE — Progress Notes (Signed)
Occupational Therapy Note  Patient Details  Name: Holly Ellison MRN: [id number] Date of Birth: [birth year] Today's Date: 07/31/2013  Pt missed 60 mins skilled OT services.  Pt laying in bed asleep upon arrival.  Pt required min verbal cues to arouse.  Pt declined therapy stating that she was in too much pain.  Reminded patient that she had stated yesterday that she wanted to take shower this morning but patient continued to decline therapy. She stated that "they are trying a new pill and we will see how that does." Encouraged patient to sit up in bed to eat breakfast but patient was resistant to repositioning in bed.  Pt stated she didn't have an appetite.    [facility] 07/31/2013, 9:54 AM

## 2013-07-31 NOTE — Progress Notes (Signed)
Patient blood glucose read 66 at 1640, patient given graham crackers, fruit cup and soda, rechecked blood sugar read at 99. Patient denies blurred vision, dizziness, appears asymptomatic. Will continue to monitor. Oval Linsey, RN

## 2013-07-31 NOTE — Progress Notes (Signed)
Patient with complaints of pain 8/10 to mid and low back. K pad and repositioning done and Tylenol 650 mg. Called Dan A. PA and discussed pain management. On scheduled Vicodin and Tylenol for breakthrough. Will monitor pain management. Blood sugar low at 66. Snack given. Rechecked at 99. Dinner given. Will monitor. Dan aware. Tonita Cong, RN

## 2013-07-31 NOTE — Progress Notes (Signed)
Spoke with patients son regarding order for pain medication: vicodin 5/325 1/2 tablet.  Son is ok with patient having vicodin.  "I am not comfortable with her having oxycodone, prozac, or muscle relaxants.  She can have vicodin for pain."  I also expressed how the patient seems to do better with therapy when he is present.  States he will come up today.  Also informed him of the confusion patient is still having overnight.  Brita Romp, RN

## 2013-07-31 NOTE — Progress Notes (Signed)
I have seen and examined this patient and agree with the plan of care  [facility] 07/31/2013, 12:59 PM

## 2013-07-31 NOTE — Progress Notes (Signed)
Social Work Patient ID: Holly Ellison, female   DOB: 01/19/1941, 73 y.o.   MRN: [id number]  Lennart Pall, LCSW Social Worker Signed  Patient Care Conference Service date: 07/31/2013 6:42 AM  Inpatient RehabilitationTeam Conference and Plan of Care Update Date: 07/30/2013   Time: 3:15 PM     Patient Name: Holly Ellison       Medical Record Number: [id number]   Date of Birth: 1940-08-07 Sex: Female         Room/Bed: 4M11C/4M11C-01 Payor Info: Payor: MEDICARE / Plan: MEDICARE PART A AND B / Product Type: *No Product type* /   Admitting Diagnosis: T11 sp cord decomp   Admit Date/Time:  07/19/2013  5:07 PM Admission Comments: No comment available   Primary Diagnosis:  <principal problem not specified> Principal Problem: <principal problem not specified>    Patient Active Problem List     Diagnosis  Date Noted   .  Compression of spinal cord with myelopathy  07/19/2013   .  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  Bacteremia due to Staphylococcus aureus  07/28/2012   .  HCAP (healthcare-associated pneumonia)  07/27/2012   .  ESRD on dialysis  07/27/2012   .  Chest pain  10/29/2011   .  Hypertensive emergency  10/29/2011   .  Bell's palsy  10/29/2011   .  Hyperthyroidism  10/29/2011   .  Pulmonary edema  06/05/2011   .  CAD (coronary artery disease) of bypass graft  06/05/2011   .  Peripheral neuropathy  06/05/2011   .  Diabetes mellitus  06/05/2011   .  Hypertension  06/05/2011   .  COPD (chronic obstructive pulmonary disease)  06/05/2011   .  Atherosclerosis of native arteries of the extremities with ulceration  05/25/2011     Expected Discharge Date: Expected Discharge Date: 08/08/13  Team Members Present:         Current Status/Progress  Goal  Weekly Team Focus   Medical     confusion---had to stop sedating meds---pain an issue  see prior  pain control. mental status   Bowel/Bladder     MOSTLY INCONTINENT OF BOPWEL AND BLADDER, PATIENT STATES UNABLE TO TELL WHEN NEEDING TO GO TO THE  BATHROOM ALL THE TIME, CHECKED FOR IMPACTION, SOFT STOOL NOTED IN RECTAL VAULT, UBALE TO MOVE BOWELS.  CONSIDER BOWEL PROGRAM?  Patient to regain continence  Timed toileting patient q3h and prn   Swallow/Nutrition/ Hydration            ADL's     bathing/dressing-mod A/max A; transfers-mod A; decreased activity tolerance; some confusion  bathing/dressing-supervision; toilet transfer-suprevision; tub transfer-min A  activity tolerance, transfers, UE strength, safety awareness, pain management   Mobility     Close(S)-min A for sitting balance and w/c propulsion, Mod A for bed mobility, squat pivot transfers and short distance amb w/ RW  Supervision-Min A, combination of w/c and ambulation  functional endurance, tolerance, B LE strength, safety during functional mobility, transfers, bed mobility, ambulation, intellectual/emergent awareness   Communication            Safety/Cognition/ Behavioral Observations    No safety issues noted       Pain     C/o mid back pain; getting some relief with po tylenol and lidocaine patches  <5  Assess and treat for pain q shift and prn   Skin     FRICTION TEAR AT SACRUM, BARRIER CREAM APPLIED, INCISION TO BACK OPEN TO AIR ,  DERMABOND PEELING.  No skin infection or breakdown during rehab stay  Assess skin q shift and prn    Rehab Goals Patient on target to meet rehab goals: Yes *See Care Plan and progress notes for long and short-term goals.    Barriers to Discharge:  cognition, see prior      Possible Resolutions to Barriers:    see prior, family ed      Discharge Planning/Teaching Needs:    home with son and daughter providing 24/7 assistance      Team Discussion:    Sensitivity to narcotics along with premorbid dementia appear to be affected progress with therapies.  Team feels first couple of days on CIR were her best and since has declined cognitively and in physical endurance.  Decreased tolerance for activities. Pt cites pain as her primary  barrier, however, MD must use pain meds sparingly because of their affect on her cogn/ phys functioning.  May need to downgrade goals.  MD to address pain carefully.  SW will follow up with pt and family to determine if d/c home is still appropriate as family will likely have to provide more assistance than initially thought.   Revisions to Treatment Plan:    Possible downgrading of some goals and change of d/c plan    Continued Need for Acute Rehabilitation Level of Care: The patient requires daily medical management by a physician with specialized training in physical medicine and rehabilitation for the following conditions: Daily direction of a multidisciplinary physical rehabilitation program to ensure safe treatment while eliciting the highest outcome that is of practical value to the patient.: Yes Daily medical management of patient stability for increased activity during participation in an intensive rehabilitation regime.: Yes Daily analysis of laboratory values and/or radiology reports with any subsequent need for medication adjustment of medical intervention for : Post surgical problems;Neurological problems;Other  Lennart Pall 07/31/2013, 6:42 AM         Lennart Pall, LCSW Social Worker Signed  Patient Care Conference Service date: 07/24/2013 7:28 AM  Inpatient RehabilitationTeam Conference and Plan of Care Update Date: 07/23/2013   Time: 3:30 PM     Patient Name: Holly Ellison       Medical Record Number: [id number]   Date of Birth: 1940/07/24 Sex: Female         Room/Bed: 4M11C/4M11C-01 Payor Info: Payor: MEDICARE / Plan: MEDICARE PART A AND B / Product Type: *No Product type* /   Admitting Diagnosis: T11 sp cord decomp   Admit Date/Time:  07/19/2013  5:07 PM Admission Comments: No comment available   Primary Diagnosis:  <principal problem not specified> Principal Problem: <principal problem not specified>    Patient Active Problem List     Diagnosis  Date Noted   .   Compression of spinal cord with myelopathy  07/19/2013   .  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  Bacteremia due to Staphylococcus aureus  07/28/2012   .  HCAP (healthcare-associated pneumonia)  07/27/2012   .  ESRD on dialysis  07/27/2012   .  Chest pain  10/29/2011   .  Hypertensive emergency  10/29/2011   .  Bell's palsy  10/29/2011   .  Hyperthyroidism  10/29/2011   .  Pulmonary edema  06/05/2011   .  CAD (coronary artery disease) of bypass graft  06/05/2011   .  Peripheral neuropathy  06/05/2011   .  Diabetes mellitus  06/05/2011   .  Hypertension  06/05/2011   .  COPD (chronic obstructive pulmonary  disease)  06/05/2011   .  Atherosclerosis of native arteries of the extremities with ulceration  05/25/2011     Expected Discharge Date: Expected Discharge Date: 08/08/13  Team Members Present: Physician leading conference: Dr. Alger Simons Social Worker Present: Lennart Pall, LCSW Nurse Present: Elliot Cousin, RN PT Present: Melene Plan, Cottie Banda, PT OT Present: Roanna Epley, COTA;Kayla Perkinson, OT;Jennifer Tamala Julian, OT SLP Present: Weston Anna, SLP PPS Coordinator present : Daiva Nakayama, RN, CRRN;Becky Alwyn Ren, PT        Current Status/Progress  Goal  Weekly Team Focus   Medical     T11 DESTRUCTIVE LESION (?OSTEO) WITH MYELOPATHY  IMPROVE FUNCTIONAL MOBILITY  PAIN CONTROL, nmr   Bowel/Bladder     Continent of bowel and bladder. LBM 6/11/5  Pt to remain continent.  Monitor for regular bowel movement q 2-3 days   Swallow/Nutrition/ Hydration            ADL's     bathing/dressing-mod A; transfers-mod A/max A; decreased activity tolerance/BUE strength  bathing/dressing-supervision; toilet transfer-suprevision; tub transfer-min A  activity tolerance, transfers, UE strength, safety awareness   Mobility     Close(S)-min guard A for sitting balance; Max A for bed mobility and w/c propulsion, Mod A for squat pivot transfers and ambulation x10'i n // bars  Supervision-Min A,  combination of w/c and ambulation  functional endurance, B LE strength, safety during functional mobility, transfers, bed mobility, ambulation, pt education   Communication            Safety/Cognition/ Behavioral Observations           Pain     No c/o pain  <3  Monitor for nonverbal cues    Skin     Hemodialysis cath LUE ++, Honeycomb dressing to surgical incision, unremarkable,   No additional skin breakdown  Routine turn q 2hrs    Rehab Goals Patient on target to meet rehab goals: Yes *See Care Plan and progress notes for long and short-term goals.    Barriers to Discharge:  NEURO DEFICITS, PAIN      Possible Resolutions to Barriers:    NMR, ADAPTIVE EQUIPMENT, SCI EDUCATION      Discharge Planning/Teaching Needs:    home with son and daughter providing 24/7 assistance      Team Discussion:    Pt very motivated, but extremely deconditioned and weak.  Anticipate need for longer LOS to meet supervision/ min assist goals.  No concerns at this time.   Revisions to Treatment Plan:    None    Continued Need for Acute Rehabilitation Level of Care: The patient requires daily medical management by a physician with specialized training in physical medicine and rehabilitation for the following conditions: Daily direction of a multidisciplinary physical rehabilitation program to ensure safe treatment while eliciting the highest outcome that is of practical value to the patient.: Yes Daily medical management of patient stability for increased activity during participation in an intensive rehabilitation regime.: Yes Daily analysis of laboratory values and/or radiology reports with any subsequent need for medication adjustment of medical intervention for : Neurological problems;Post surgical problems  HOYLE, LUCY 07/24/2013, 7:28 AM

## 2013-07-31 NOTE — Progress Notes (Signed)
Physical Therapy Session Note  Patient Details  Name: Holly Ellison MRN: [id number] Date of Birth: 06/26/1940  Today's Date: 07/31/2013 Time: Treatment Session 1: 0810-0825; Treatment Session 2: 4562-5638; Treatment Session 3: 9373-4287  Time Calculation (min):Treatment Session 1: 15 min; Treatment Session 2: 103min; Treatment Session 3: 50min  Short Term Goals: Week 2:  PT Short Term Goal 1 (Week 2): Pt to perform bed mobility w/ min A and use of hospital bed functions PT Short Term Goal 2 (Week 2): Pt to perform bed<>w/c transfers w/ min A and  min cueing for technique.  PT Short Term Goal 3 (Week 2): Pt will be able to propel the wheelchair 50' w/ mod A in controlled environment PT Short Term Goal 4 (Week 2): Pt to transfer sit<>stand w/ min A and B UE support PT Short Term Goal 5 (Week 2): Pt will be able to ambulate 20-25 feet using the LRAD with mod A   Skilled Therapeutic Interventions/Progress Updates:  Treatment Session 1: 1:1. Pt received semi-reclined in bed w/ RN in room providing medication. PT taking over to start tx session. Pt initially shaking head no, not wanting to participate in therapy due to back pain and poor sleep last night. With mod encouragement pt agreeable to sit EOB for emphasis on repositioning towards [facility] for improved comfort, req max A. Immediately upon sitting up pt crying out in pain and needing to lie back down. Despite encouragement for completion of therex at bed level, pt declined further participation in therapy due to pain. RN aware. Pt missed 63min this session due to nursing needs, pain and declining further participation in therapy.   Treatment Session 2: 1:1. Pt received semi-reclined in bed. No receptive to therapist's presence stating, "oh no, not you again! I do not want to do anything!" Pt educated on benefits of therapy as well as importance of nutrition in regards to healing as pt had no eaten anything on lunch tray. Conversation w/ pt initially  talking about therapy, poor tolerance to dialysis and feels like she screams at night for help, however, becoming tangential w/ pt talking about wanting her mom and wondering why her mom hasn't come to get her (mother has been dead for 68+ yrs, uncertain if pt meant physically or spiritually), poor family dynamics with her children, having no motivation/appetite and ultimately asking why she was here. Emotional support/education provided with attempt to redirect pt, RN aware of concerns. Pt agreeable to get dressed and head to nurses station to receive nutritional drink w/ max cueing. Pt req overall mod A for t/f sup>sit w/ poor tolerance in sitting, donning shirt (pt declined to don pants, blanket placed over legs) and transfer to w/c. Pt sitting in w/c at end of session in care of RN, slightly tearful due to having to sit up despite education regarding benefits. Requested pt to be transferred close to nurses station for overall safety.   Treatment Session 3:  1:1. Pt received semi-reclined in bed, again not receptive to therapist's presence. Pt req mod encouragement x85min for participation in session, ultimately agreeable to supine therex for B LE strengthening. Pt w/ good tolerance to 2x10 reps of clam shells and resisted hip flex/extension, w/ improved strength noted in R LE> L LE. Pt req min A w/ use of chux for B rolling during session. Pt semi-reclined in bed at end of session w/ all needs in reach, bed alarm on and nurse tech in room.   Therapy Documentation Precautions:  Precautions Precautions:  Back;Fall Precaution Comments: Fistula L arm Required Braces or Orthoses: Spinal Brace Restrictions Weight Bearing Restrictions: No General: Amount of Missed PT Time (min): 45 Minutes Missed Time Reason: Nursing care;Pain;Patient unwilling/refused to participate without medical reason Pain: Pain Assessment Pain Assessment: Faces Faces Pain Scale: Hurts whole lot Pain Type: Acute pain Pain  Location: Back Pain Orientation: Mid Pain Descriptors / Indicators: Aching Pain Onset: With Activity Pain Intervention(s): RN made aware;Repositioned;Rest;Distraction  See FIM for current functional status  Therapy/Group: Individual Therapy  Gilmore Laroche 07/31/2013, 8:32 AM

## 2013-07-31 NOTE — Progress Notes (Signed)
Chrys Racer PT, suggested that the patient would benefit from sitting at the  Nurses station and her room being closer to nursing station.  Patient room changed from 11 -4.  Patient unable to tolerate sitting in wheelchair, was sitting in chair in tears, moaning stating " That she wants to go to bed". When asked why she does not want to sit in the chair, patient is unable to verbalize reason.  Patient assist back to bed x 2 assist, turned onto her left side with heating pad applied, pillows along right side and under knees and SCD and ted hose applied. Once back in bed patient stopped moaning, no tears observed, resting in bed.   Attempted to contact patient's son x 2, no answer, left a call back number.  CARPENTER,STACEY RN

## 2013-07-31 NOTE — Progress Notes (Signed)
Patient rates pain 10/10, called patient's son to confirm that it is o.k. To administer patient pain med, son has\d voiced concerns previously regarding patient's drowsiness after receiving pain medication and had requested that she not receive the medications. Called patient's son from front desk and from patient's phone, no answer, left voice message. Patient is aware. Awaiting return phone call from son.

## 2013-07-31 NOTE — Progress Notes (Signed)
Subjective:  In bed with low back pain, no appetite  Objective: Vital signs in last 24 hours: Temp:  [97.6 F (36.4 C)-98.2 F (36.8 C)] 97.6 F (36.4 C) (06/24 0800) Pulse Rate:  [66-79] 66 (06/24 0800) Resp:  [18-19] 19 (06/24 0540) BP: (132-193)/(42-75) 193/47 mmHg (06/24 0800) SpO2:  [97 %-100 %] 100 % (06/24 0540) Weight:  [71.4 kg (157 lb 6.5 oz)-73.9 kg (162 lb 14.7 oz)] 73.9 kg (162 lb 14.7 oz) (06/24 0540) Weight change: 1.1 kg (2 lb 6.8 oz)  Intake/Output from previous day: 06/23 0701 - 06/24 0700 In: 490 [P.O.:490] Out: 2005 [Urine:1]   Lab Results:  Recent Labs  07/30/13 1800  WBC 11.7*  HGB 7.2*  HCT 21.8*  PLT 358   BMET:  Recent Labs  07/30/13 1800  NA 132*  K 4.1  CL 90*  CO2 26  GLUCOSE 129*  BUN 46*  CREATININE 5.76*  CALCIUM 9.0  ALBUMIN 2.7*   No results found for this basename: PTH,  in the last 72 hours Iron Studies: No results found for this basename: IRON, TIBC, TRANSFERRIN, FERRITIN,  in the last 72 hours  Studies/Results: No results found.  EXAM:  General appearance: Alert, in no apparent distress  Resp: CTA without rales, rhonchi, or wheezes  Cardio: RRR without murmur or rub  GI: + BS, soft and nontender  Extremities: No edema  Access: AVF @ LUA with + bruit   HD: [facility]  4h 2/2.25 Bath 73kg LUA AVF Heparin 4500  Aranesp 25 mcg q 4 weeks, last 6/9 Hectorol 4 ug TIW  Tsat 26%, ferr 1119, pth 286  Assessment/Plan: 1. Recent AMS - now resolved, believed to be sec to meds; recommended only Tylenol for pain, but pain poorly controlled. 2. 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 3. Low back pain - preventing improvement, Lidoderm patches, low-dose Hydrocodone resumed. 4. Depression - Lexapro started this admission was dc'd, believed to contribute to AMS, but resumed today. 5. ESRD - HD  on TTS @ [facility], K 4.1. Next HD tomorrow.  6. HTN/Volume - BP 193/47 on Amlodipine 5 mg qd, Metoprolol 50 mg bid; wt 73.9 kg, @ EDW.  7. Anemia - Hgb down to 7.2, s/p 1 U PRBCs 6/20, Aranesp 100 mcg on Thurs. Check CBC pre-HD tomorrow.  8. Sec HPT - Ca 9 (10 corrected), P 4.3; Hectorol 4 mcg on hold, Tums with meals.  9. Nutrition - Alb down to 2.7, renal carb-mod diet, vitamin.  10. CAD - 7 stents, CABG, on Ranexa.  11. DM - insulin per primary.    LOS: 12 days   LYLES,CHARLES 07/31/2013,9:05 AM

## 2013-08-01 ENCOUNTER — Inpatient Hospital Stay (HOSPITAL_COMMUNITY): Payer: Medicare Other

## 2013-08-01 ENCOUNTER — Inpatient Hospital Stay (HOSPITAL_COMMUNITY): Payer: Medicare Other | Admitting: Physical Therapy

## 2013-08-01 ENCOUNTER — Encounter (HOSPITAL_COMMUNITY): Payer: Medicare Other

## 2013-08-01 LAB — GLUCOSE, CAPILLARY
GLUCOSE-CAPILLARY: 58 mg/dL — AB (ref 70–99)
GLUCOSE-CAPILLARY: 80 mg/dL (ref 70–99)
Glucose-Capillary: 46 mg/dL — ABNORMAL LOW (ref 70–99)
Glucose-Capillary: 73 mg/dL (ref 70–99)
Glucose-Capillary: 112 mg/dL — ABNORMAL HIGH (ref 70–99)
Glucose-Capillary: 84 mg/dL (ref 70–99)

## 2013-08-01 LAB — RENAL FUNCTION PANEL
Albumin: 2.8 g/dL — ABNORMAL LOW (ref 3.5–5.2)
BUN: 25 mg/dL — ABNORMAL HIGH (ref 6–23)
CALCIUM: 8.9 mg/dL (ref 8.4–10.5)
CHLORIDE: 92 meq/L — AB (ref 96–112)
CO2: 24 mEq/L (ref 19–32)
CREATININE: 3.82 mg/dL — AB (ref 0.50–1.10)
GFR, EST AFRICAN AMERICAN: 13 mL/min — AB (ref >90)
GFR, EST NON AFRICAN AMERICAN: 11 mL/min — AB (ref >90)
Glucose, Bld: 187 mg/dL — ABNORMAL HIGH (ref 70–99)
Phosphorus: 4.4 mg/dL (ref 2.3–4.6)
Potassium: 3.6 mEq/L — ABNORMAL LOW (ref 3.7–5.3)
SODIUM: 135 meq/L — AB (ref 137–147)

## 2013-08-01 LAB — CBC
HCT: 24.9 % — ABNORMAL LOW (ref 36.0–46.0)
Hemoglobin: 8.1 g/dL — ABNORMAL LOW (ref 12.0–15.0)
MCH: 32.7 pg (ref 26.0–34.0)
MCHC: 32.5 g/dL (ref 30.0–36.0)
MCV: 100.4 fL — ABNORMAL HIGH (ref 78.0–100.0)
PLATELETS: 350 10*3/uL (ref 150–400)
RBC: 2.48 MIL/uL — AB (ref 3.87–5.11)
RDW: 16.8 % — ABNORMAL HIGH (ref 11.5–15.5)
WBC: 11.3 10*3/uL — AB (ref 4.0–10.5)

## 2013-08-01 MED ORDER — DARBEPOETIN ALFA-POLYSORBATE 100 MCG/0.5ML IJ SOLN
INTRAMUSCULAR | Status: AC
  Filled 2013-08-01: qty 0.5

## 2013-08-01 MED ORDER — SIMETHICONE 80 MG PO CHEW
80.0000 mg | CHEWABLE_TABLET | Freq: Four times a day (QID) | ORAL | Status: DC | PRN
  Administered 2013-08-01: 80 mg via ORAL
  Filled 2013-08-01: qty 1

## 2013-08-01 MED ORDER — SODIUM CHLORIDE 0.9 % IV SOLN: 100.0000 mL | INTRAVENOUS | Status: DC | PRN

## 2013-08-01 MED ORDER — PENTAFLUOROPROP-TETRAFLUOROETH EX AERO: 1.0000 "application " | INHALATION_SPRAY | CUTANEOUS | Status: DC | PRN

## 2013-08-01 MED ORDER — TRAZODONE HCL 50 MG PO TABS
50.0000 mg | ORAL_TABLET | Freq: Every day | ORAL | Status: DC
  Administered 2013-08-01 – 2013-08-14 (×14): 50 mg via ORAL
  Filled 2013-08-01 (×21): qty 1

## 2013-08-01 MED ORDER — HEPARIN SODIUM (PORCINE) 1000 UNIT/ML DIALYSIS: 1000.0000 [IU] | INTRAMUSCULAR | Status: DC | PRN

## 2013-08-01 MED ORDER — LIDOCAINE-PRILOCAINE 2.5-2.5 % EX CREA
1.0000 "application " | TOPICAL_CREAM | CUTANEOUS | Status: DC | PRN
  Filled 2013-08-01: qty 5

## 2013-08-01 MED ORDER — ALPRAZOLAM 0.25 MG PO TABS
0.2500 mg | ORAL_TABLET | Freq: Three times a day (TID) | ORAL | Status: DC | PRN
  Administered 2013-08-01 – 2013-08-14 (×15): 0.25 mg via ORAL
  Filled 2013-08-01 (×15): qty 1

## 2013-08-01 MED ORDER — HEPARIN SODIUM (PORCINE) 1000 UNIT/ML DIALYSIS: 20.0000 [IU]/kg | INTRAMUSCULAR | Status: DC | PRN

## 2013-08-01 MED ORDER — NEPRO/CARBSTEADY PO LIQD
237.0000 mL | ORAL | Status: DC | PRN
  Filled 2013-08-01: qty 237

## 2013-08-01 MED ORDER — LIDOCAINE-PRILOCAINE 2.5-2.5 % EX CREA
1.0000 | TOPICAL_CREAM | CUTANEOUS | Status: DC | PRN
Start: 2013-08-01 — End: 2013-08-01
  Filled 2013-08-01: qty 5

## 2013-08-01 MED ORDER — ALTEPLASE 2 MG IJ SOLR
2.0000 mg | Freq: Once | INTRAMUSCULAR | Status: DC | PRN
  Filled 2013-08-01: qty 2

## 2013-08-01 MED ORDER — LIDOCAINE HCL (PF) 1 % IJ SOLN: 5.0000 mL | INTRAMUSCULAR | Status: DC | PRN

## 2013-08-01 NOTE — Progress Notes (Signed)
Cannulated pt's left upper arm AVF arterial needle in and venous needle in using the button hole- arterial needle started to bleed profusely and the venous needle just stopped working and visible clots were in the line, both needles had to be removed and sharp needles were placed in arterial and venous sites above the button holes and tx was then started without any problems, pt tolerated procedures well

## 2013-08-01 NOTE — Progress Notes (Signed)
Pt crying and wanting to sign off tx early AMA sheet was signed and pt returned to her room

## 2013-08-01 NOTE — Progress Notes (Signed)
Recreational Therapy Session Note  Patient Details  Name: Holly Ellison MRN: [id number] Date of Birth: 08/20/1940 Today's Date: 08/01/2013  LRT has attepmted therapy/eval completion with pt on multiple occasions formally & informally scheduled.  Pt declines due to pain.  Team notes limited participation in therapies as well.  No formal eval/treatment implemented.  Will continue to monitor through team for future participation. SIMPSON,LISA 08/01/2013, 8:54 AM

## 2013-08-01 NOTE — Progress Notes (Signed)
Physical Therapy Session Note  Patient Details  Name: Holly Ellison MRN: [id number] Date of Birth: 1940/08/03  Today's Date: 08/01/2013 Time: 0800 , 1000, and 1530-1615 Time Calculation (min): 45 min   Short Term Goals: Week 2:  PT Short Term Goal 1 (Week 2): Pt to perform bed mobility w/ min A and use of hospital bed functions PT Short Term Goal 2 (Week 2): Pt to perform bed<>w/c transfers w/ min A and  min cueing for technique.  PT Short Term Goal 3 (Week 2): Pt will be able to propel the wheelchair 50' w/ mod A in controlled environment PT Short Term Goal 4 (Week 2): Pt to transfer sit<>stand w/ min A and B UE support PT Short Term Goal 5 (Week 2): Pt will be able to ambulate 20-25 feet using the LRAD with mod A   Skilled Therapeutic Interventions/Progress Updates:   Session 1: Pt received semi reclined in bed, moaning in pain, RN present. Pt with low blood glucose and increased pain in upper left quadrant that is tender to palpation and distended, per RN. RN notified PA of c/o pain. Pt left in care of RN. Patient missed 30 minutes of skilled physical therapy due to pain and nursing care, will f/u as able.   Session 2: Pt received semi reclined in bed. Transport arriving to take patient for imaging. Patient missed 60 minutes of skilled physical therapy due to patient being taken off unit for imaging, will f/u per POC.   Session 3: Pt received semi reclined in bed with RN present and son and grandson in room. With max coaxing from all persons in room, patient transferred supine <> sit with HOB raised and bed rail with max A and total A to scoot to EOB. Therapist threading pants for LB dressing and sit <> stand total Aa to pull up pants with +2 assist. Squat pivot transfer bed <> w/c with max A. In parallel bars, patient ambulated 2 x 15 ft with min A and vc's for upright posture. Pt demo poor pain and frustration tolerance, laying head on therapist's chest in standing and throwing arms  over/leaning over // bars in forward flexed position. Pt with increased fear/anxiety in therapy, despite pt requiring only min guard for sit<>stand and gait. Pt continues to shout out, "My legs are buckling!" while walking with no buckling noted. Pt with increased LBP noted after sitting back in w/c between trials. In // bars with 1 seated rest break, pt performed LE marching 2 x 10. Pt continuing to cry and requesting to return to room and bed. Pt's son initially providing positive motivation to get patient OOB, then after patient demo crying, 10/10 pain, and shouting out in pain with therapist agreeable to return patient to room, son stated, "No wonder she isn't getting better if it's that easy to get out of therapy. I thought you forced them to participate." Patient and family education provided regarding inpatient rehab criteria to include patient participation and being sensitive to patient's needs/pain. Pt left semi reclined in bed with K pad applied and family present. RN notified of patient requesting pain medication and updated on therapy session.   Therapy Documentation Precautions:  Precautions Precautions: Back;Fall Precaution Comments: Fistula L arm Required Braces or Orthoses: Spinal Brace Restrictions Weight Bearing Restrictions: No General: Session 1: Amount of Missed PT Time (min): 30 Minutes Missed Time Reason: Pain;Nursing care Session 2: Amount of Missed PT Time (min): 60 Minutes Missed Time Reason: Off unit for imaging Pain:  Pain Assessment Pain Assessment: Faces Pain Score: 6  Faces Pain Scale: Hurts whole lot Pain Type: Acute pain Pain Location: Back Pain Orientation: Mid;Lower Pain Descriptors / Indicators: Moaning;Crying;Aching;Discomfort Pain Onset: With Activity (during therapy) Pain Intervention(s): Repositioned;Emotional support;Ambulation/increased activity  See FIM for current functional status  Therapy/Group: Individual Therapy  Laretta Alstrom 08/01/2013, 9:11 AM

## 2013-08-01 NOTE — Plan of Care (Signed)
Problem: RH SKIN INTEGRITY Goal: RH STG ABLE TO PERFORM INCISION/WOUND CARE W/ASSISTANCE STG Able To Perform Incision/Wound Care With Min assistance of staff/caregiver  Outcome: Progressing Incision is healing

## 2013-08-01 NOTE — Progress Notes (Signed)
I have seen and examined this patient and agree with the plan of care. Abdominal pain this morning appears out of proportion to examination. Consider ischemia or referred pain WEBB,MARTIN W 08/01/2013, 12:13 PM

## 2013-08-01 NOTE — Progress Notes (Signed)
Patient ID: Holly Ellison, female   DOB: 1940/08/20, 73 y.o.   MRN: [id number] 73 y.o. female with history of DM type 2 with neuropathy, CAD, COPD, ESRD, HA, who was admitted on 07/16/13 pm with one week history of BLE, fecal incontinence and severe back pain. 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 Vertell Limber. Frozen section appeared benign and NS questions old or indolent osteomyelitis. Patient with history of MSSA bacteremia 07/2012 s/p 4 weeks of antibiotic therapy. CT chest without evidence of malignancy. HD ongoing and labs with evidence of ABLA with down to 7.1 as well as leucocytosis with WBC-16.0.    Subjective/Complaints: Had a better night. Received trazodone which helped. Hydrocodone appears effective thus far for LBP  Review of Systems   All other systems reviewed and are negative.  Objective: Vital Signs: Blood pressure 196/66, pulse 67, temperature 98.1 F (36.7 C), temperature source Oral, resp. rate 18, height 5' 6.14" (1.68 m), weight 73.9 kg (162 lb 14.7 oz), SpO2 93.00%. No results found. Results for orders placed during the hospital encounter of 07/19/13 (from the past 72 hour(s))  GLUCOSE, CAPILLARY     Status: Abnormal   Collection Time    07/29/13 11:17 AM      Result Value Ref Range   Glucose-Capillary 181 (*) 70 - 99 mg/dL   Comment 1 Notify RN    GLUCOSE, CAPILLARY     Status: Abnormal   Collection Time    07/29/13  4:47 PM      Result Value Ref Range   Glucose-Capillary 144 (*) 70 - 99 mg/dL  GLUCOSE, CAPILLARY     Status: Abnormal   Collection Time    07/29/13  9:41 PM      Result Value Ref Range   Glucose-Capillary 146 (*) 70 - 99 mg/dL  GLUCOSE, CAPILLARY     Status: Abnormal   Collection Time    07/30/13  7:09 AM      Result Value Ref Range   Glucose-Capillary 107 (*) 70 - 99 mg/dL  GLUCOSE,  CAPILLARY     Status: Abnormal   Collection Time    07/30/13 11:12 AM      Result Value Ref Range   Glucose-Capillary 136 (*) 70 - 99 mg/dL  GLUCOSE, CAPILLARY     Status: Abnormal   Collection Time    07/30/13  4:38 PM      Result Value Ref Range   Glucose-Capillary 183 (*) 70 - 99 mg/dL  CBC     Status: Abnormal   Collection Time    07/30/13  6:00 PM      Result Value Ref Range   WBC 11.7 (*) 4.0 - 10.5 K/uL   RBC 2.25 (*) 3.87 - 5.11 MIL/uL   Hemoglobin 7.2 (*) 12.0 - 15.0 g/dL   HCT 21.8 (*) 36.0 - 46.0 %   MCV 96.9  78.0 - 100.0 fL   MCH 32.0  26.0 - 34.0 pg   MCHC 33.0  30.0 - 36.0 g/dL   RDW 16.4 (*) 11.5 - 15.5 %   Platelets 358  150 - 400 K/uL  RENAL FUNCTION PANEL     Status: Abnormal   Collection Time    07/30/13  6:00 PM      Result Value Ref Range   Sodium 132 (*) 137 - 147 mEq/L   Potassium 4.1  3.7 -  5.3 mEq/L   Chloride 90 (*) 96 - 112 mEq/L   CO2 26  19 - 32 mEq/L   Glucose, Bld 129 (*) 70 - 99 mg/dL   BUN 46 (*) 6 - 23 mg/dL   Creatinine, Ser 5.76 (*) 0.50 - 1.10 mg/dL   Calcium 9.0  8.4 - 10.5 mg/dL   Phosphorus 4.3  2.3 - 4.6 mg/dL   Albumin 2.7 (*) 3.5 - 5.2 g/dL   GFR calc non Af Amer 7 (*) >90 mL/min   GFR calc Af Amer 8 (*) >90 mL/min   Comment: (NOTE)     The eGFR has been calculated using the CKD EPI equation.     This calculation has not been validated in all clinical situations.     eGFR's persistently <90 mL/min signify possible Chronic Kidney     Disease.  GLUCOSE, CAPILLARY     Status: None   Collection Time    07/30/13 11:49 PM      Result Value Ref Range   Glucose-Capillary 94  70 - 99 mg/dL   Comment 1 Notify RN    GLUCOSE, CAPILLARY     Status: Abnormal   Collection Time    07/31/13  6:29 AM      Result Value Ref Range   Glucose-Capillary 149 (*) 70 - 99 mg/dL   Comment 1 Notify RN    GLUCOSE, CAPILLARY     Status: Abnormal   Collection Time    07/31/13  7:18 AM      Result Value Ref Range   Glucose-Capillary 108 (*) 70 -  99 mg/dL  GLUCOSE, CAPILLARY     Status: Abnormal   Collection Time    07/31/13 11:25 AM      Result Value Ref Range   Glucose-Capillary 138 (*) 70 - 99 mg/dL  GLUCOSE, CAPILLARY     Status: Abnormal   Collection Time    07/31/13  4:36 PM      Result Value Ref Range   Glucose-Capillary 66 (*) 70 - 99 mg/dL  GLUCOSE, CAPILLARY     Status: None   Collection Time    07/31/13  5:09 PM      Result Value Ref Range   Glucose-Capillary 99  70 - 99 mg/dL  GLUCOSE, CAPILLARY     Status: Abnormal   Collection Time    07/31/13  9:06 PM      Result Value Ref Range   Glucose-Capillary 123 (*) 70 - 99 mg/dL  GLUCOSE, CAPILLARY     Status: Abnormal   Collection Time    08/01/13  7:18 AM      Result Value Ref Range   Glucose-Capillary 46 (*) 70 - 99 mg/dL   Comment 1 Notify RN    GLUCOSE, CAPILLARY     Status: Abnormal   Collection Time    08/01/13  7:45 AM      Result Value Ref Range   Glucose-Capillary 58 (*) 70 - 99 mg/dL   Comment 1 Notify RN        Nursing note and vitals reviewed.  Constitutional: She is oriented to person, place, and time. She appears well-developed and well-nourished. She is sleeping. She is easily aroused.  HENT:  Head: Normocephalic and atraumatic.  Eyes: Conjunctivae are normal. Pupils are equal, round, and reactive to light.  Neck: Normal range of motion. Neck supple.  Cardiovascular: Normal rate and regular rhythm.  Respiratory: Effort normal and breath sounds normal. No respiratory distress. She has no  wheezes.  GI: Soft. Bowel sounds are normal. She exhibits no distension. There is no tenderness.  Musculoskeletal: She exhibits no edema, back remains tender.  Neurological: She is oriented to person, place, time. Improved awareness and insight Speech clear. Follows commands without difficulty.  Bilateral hands with thenar atrophy and contracture with decrease in fine motor movements. Numbness stocking-glove distribution persists.  Skin: Skin is warm and  dry.  Back incision clean 4 minus bilateral deltoid, bicep, tricep 3 minus bilateral finger flexors and extensors  2 minus bilateral hip flexors 3 minus bilaterally knee extensors 2 minus bilateral ankle dorsiflexors and plantar flexors Psych: pleasant, cooperative, a little confused  Assessment/Plan: 1. Functional deficits secondary to T11 lesion with paraplegia which require 3+ hours per day of interdisciplinary therapy in a comprehensive inpatient rehab setting. Physiatrist is providing close team supervision and 24 hour management of active medical problems listed below. Physiatrist and rehab team continue to assess barriers to discharge/monitor patient progress toward functional and medical goals. FIM: FIM - Bathing Bathing Steps Patient Completed: Chest;Right Arm;Left Arm;Abdomen;Front perineal area (bed level) Bathing: 3: Mod-Patient completes 5-7 66f10 parts or 50-74% (bed level)  FIM - Upper Body Dressing/Undressing Upper body dressing/undressing steps patient completed: Thread/unthread right sleeve of pullover shirt/dresss;Thread/unthread left sleeve of pullover shirt/dress (bed level) Upper body dressing/undressing: 3: Mod-Patient completed 50-74% of tasks (bed level) FIM - Lower Body Dressing/Undressing Lower body dressing/undressing steps patient completed: Thread/unthread left underwear leg;Thread/unthread left pants leg Lower body dressing/undressing: 0: Activity did not occur (Bowel issues and too much back pain)  FIM - Toileting Toileting steps completed by patient: Adjust clothing prior to toileting;Performs perineal hygiene;Adjust clothing after toileting Toileting Assistive Devices: Grab bar or rail for support Toileting: 0: Activity did not occur  FIM - TRadio producerDevices: Grab bars Toilet Transfers: 0-Activity did not occur  FIM - BControl and instrumentation engineerDevices: Bed rails;HOB elevated Bed/Chair Transfer:  2: Supine > Sit: Max A (lifting assist/Pt. 25-49%);2: Sit > Supine: Max A (lifting assist/Pt. 25-49%)  FIM - Locomotion: Wheelchair Distance: 60 Locomotion: Wheelchair: 0: Activity did not occur FIM - Locomotion: Ambulation Locomotion: Ambulation Assistive Devices: Parallel bars;Walker - Rolling Ambulation/Gait Assistance: 4: Min assist;3: Mod assist Locomotion: Ambulation: 0: Activity did not occur  Comprehension Comprehension Mode: Auditory Comprehension: 4-Understands basic 75 - 89% of the time/requires cueing 10 - 24% of the time  Expression Expression Mode: Verbal Expression: 3-Expresses basic 50 - 74% of the time/requires cueing 25 - 50% of the time. Needs to repeat parts of sentences.  Social Interaction Social Interaction: 2-Interacts appropriately 25 - 49% of time - Needs frequent redirection.  Problem Solving Problem Solving: 2-Solves basic 25 - 49% of the time - needs direction more than half the time to initiate, plan or complete simple activities  Memory Memory: 2-Recognizes or recalls 25 - 49% of the time/requires cueing 51 - 75% of the time  Medical Problem List and Plan:  1. 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  2. DVT Prophylaxis/Anticoagulation: Mechanical: Antiembolism stockings, thigh (TED hose) Bilateral lower extremities  Sequential compression devices, below knee Bilateral lower extremities  3. Pain Management: lidoderm patches for back.   -utilize, tylenol, ice, heat--   -resumed LOW dose scheduled hydrocodone at 7am and 12 noon each day to coincide with therapy 4. H/o depression/Mood: Resume lexapro. LCSW to follow for evaluation and support.  5. Neuropsych: This patient is not currently capable of  making decisions on her own behalf.  6. ESRD: Continue HD on TTS after therapy sessions.  7. DM type 2 with peripheral neuropathy:  Continue lantus insulin with SSI for  elevated BS. Sugars improving---backed down to 20 units lantus hs given hypoglycemia   -numbers more balanced yesterday  8. CAD: Continue plavix, lipitor, lopressor and ranexa.  9. Leucocytosis: Monitor for signs of infection with close watch on temp curve as well as wound.  10. HTN:  norvasc  65m daily---increase to 124m   -  Prn po clonidine in place.  11. Acute on chronic anemia: On aranesp. Transfusion with dialysis prn.  12. AMS: much improved today   -still some hs confusion (but less severe)  -will need to be conservative with pain measures.   LOS (Days) 13 A FACE TO FACE EVALUATION WAS PERFORMED  SWARTZ,ZACHARY T 08/01/2013, 7:54 AM

## 2013-08-01 NOTE — Progress Notes (Signed)
Subjective:  Slept well during night, but significant left upper abdominal pain this morning, last BM yesterday  Objective: Vital signs in last 24 hours: Temp:  [98.1 F (36.7 C)-98.6 F (37 C)] 98.1 F (36.7 C) (06/25 0500) Pulse Rate:  [67-82] 70 (06/25 0810) Resp:  [18] 18 (06/25 0500) BP: (153-196)/(54-77) 178/70 mmHg (06/25 0810) SpO2:  [93 %-99 %] 93 % (06/25 0500) Weight change:   Intake/Output from previous day: 06/24 0701 - 06/25 0700 In: 360 [P.O.:360] Out: 2 [Urine:2] Intake/Output this shift:   Lab Results:  Recent Labs  07/30/13 1800  WBC 11.7*  HGB 7.2*  HCT 21.8*  PLT 358   BMET:  Recent Labs  07/30/13 1800  NA 132*  K 4.1  CL 90*  CO2 26  GLUCOSE 129*  BUN 46*  CREATININE 5.76*  CALCIUM 9.0  ALBUMIN 2.7*   No results found for this basename: PTH,  in the last 72 hours Iron Studies: No results found for this basename: IRON, TIBC, TRANSFERRIN, FERRITIN,  in the last 72 hours  Studies/Results: No results found.  EXAM:  General appearance: Alert, but in moderate distress, sec to abdominal pain  Resp: CTA without rales, rhonchi, or wheezes  Cardio: RRR without murmur or rub  GI: + BS, soft, tender to light palpation of LUQ Extremities: No edema  Access: AVF @ LUA with + bruit   HD: [facility]  4h 2/2.25 Bath 73kg LUA AVF Heparin 4500  Aranesp 25 mcg q 4 weeks, last 6/9 Hectorol 4 ug TIW  Tsat 26%, ferr 1119, pth 286  Assessment/Plan: 1. Pain - LUQ pain likely musculoskeletal, but x-rays pending; on Lidoderm patches & low-dose Hydrocodone for low back pain. 2. Recent AMS - now resolved, believed to be sec to meds; recommended only Tylenol for pain, but pain poorly controlled.  3. 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  4. Depression - Lexapro started this admission was dc'd, believed to contribute to  AMS, but resumed yesterday.  5. ESRD - HD on TTS @ [facility], K 4.1. HD pending today. 6. HTN/Volume - BP 178/70 on Amlodipine 5 mg qd, Metoprolol 50 mg bid; wt 73.9 kg, @ EDW.  7. Anemia - Hgb down to 7.2, s/p 1 U PRBCs 6/20, Aranesp 100 mcg on Thurs. Check CBC pre-HD today.  8. Sec HPT - Ca 9 (10 corrected), P 4.3; Hectorol 4 mcg on hold, Tums with meals.  9. Nutrition - Alb down to 2.7, renal carb-mod diet, vitamin.  10. CAD - 7 stents, CABG, on Ranexa.  11. DM - insulin per primary.     LOS: 13 days   Ricard Faulkner 08/01/2013,8:29 AM

## 2013-08-01 NOTE — Plan of Care (Signed)
Problem: RH PAIN MANAGEMENT Goal: RH STG PAIN MANAGED AT OR BELOW PT'S PAIN GOAL Pain <2 on 0-10 scale with prn medications  Outcome: Not Progressing Rates abdominal pain as 6

## 2013-08-01 NOTE — Progress Notes (Signed)
Occupational Therapy Note  Patient Details  Name: Holly Ellison MRN: [id number] Date of Birth: October 31, 1940 Today's Date: 08/01/2013  Pt missed 60 mins skilled OT services.  Pt laying in bed upon arrival and began grimacing and "crying" when addressed.  Pt repeated "I don't know what's wrong with me.  I hurt every time I move." Emotional support provided and encouraged to participate.  Attempted to engage patient in bed mobility and sitting EOB.  Pt refused.   Leotis Shames [facility] 08/01/2013, 12:01 PM

## 2013-08-01 NOTE — Progress Notes (Signed)
Social Work Patient ID: Holly Ellison, female   DOB: 12/05/1940, 73 y.o.   MRN: [id number]  Met with pt today to review team conference and discuss d/c plans.  Pt reports back pain under better control now, however, with new "belly pain".  Awaiting results of xray performed.  Despite pain, pt very clear cognitively and able to discuss d/c plan issues.  Explained to her that team concerned she may require more care than her family could provide at home.  Pt agrees and very open to discussion about possible SNF.  She asks appropriate questions about process for this and about how she would continue her HD if at SNF.  Plan to check on her tomorrow and confirm if she would like to change plan to SNF.  HOYLE, LUCY, LCSW

## 2013-08-01 NOTE — Progress Notes (Signed)
Inpatient Diabetes Program Recommendations  AACE/ADA: New Consensus Statement on Inpatient Glycemic Control (2013)  Target Ranges:  Prepandial:   less than 140 mg/dL      Peak postprandial:   less than 180 mg/dL (1-2 hours)      Critically ill patients:  140 - 180 mg/dL  Results for ELMIRE, AMREIN (MRN [id number]) as of 08/01/2013 12:02  Ref. Range 07/31/2013 21:06 08/01/2013 07:18 08/01/2013 07:45 08/01/2013 08:11 08/01/2013 11:18  Glucose-Capillary Latest Range: 70-99 mg/dL 123 (H) 46 (L) 58 (L) 73 80  Consider decreasing Lantus to 10 units.   Thank you  Raoul Pitch BSN, RN,CDE Inpatient Diabetes Coordinator [phone] (team pager)

## 2013-08-02 ENCOUNTER — Inpatient Hospital Stay (HOSPITAL_COMMUNITY): Payer: Medicare Other

## 2013-08-02 ENCOUNTER — Encounter (HOSPITAL_COMMUNITY): Payer: Medicare Other

## 2013-08-02 DIAGNOSIS — G8220 Paraplegia, unspecified: Secondary | ICD-10-CM

## 2013-08-02 LAB — GLUCOSE, CAPILLARY
GLUCOSE-CAPILLARY: 151 mg/dL — AB (ref 70–99)
GLUCOSE-CAPILLARY: 114 mg/dL — AB (ref 70–99)
Glucose-Capillary: 124 mg/dL — ABNORMAL HIGH (ref 70–99)
Glucose-Capillary: 171 mg/dL — ABNORMAL HIGH (ref 70–99)
Glucose-Capillary: 99 mg/dL (ref 70–99)

## 2013-08-02 MED ORDER — INSULIN GLARGINE 100 UNIT/ML ~~LOC~~ SOLN
10.0000 [IU] | Freq: Every day | SUBCUTANEOUS | Status: DC
  Administered 2013-08-02 – 2013-08-15 (×14): 10 [IU] via SUBCUTANEOUS
  Filled 2013-08-02 (×14): qty 0.1

## 2013-08-02 NOTE — Plan of Care (Signed)
Problem: RH Balance Goal: LTG Patient will maintain dynamic standing balance (PT) LTG: Patient will maintain dynamic standing balance with assistance during mobility activities (PT)  Downgraded due to continued need for physical assist for safety.   Problem: RH Bed Mobility Goal: LTG Patient will perform bed mobility with assist (PT) LTG: Patient will perform bed mobility with assistance, with/without cues (PT).  Downgraded due to increased need for physical assist due to pain.   Problem: RH Bed to Chair Transfers Goal: LTG Patient will perform bed/chair transfers w/assist (PT) LTG: Patient will perform bed/chair transfers with assistance, with/without cues (PT).  Goal downgraded due to consistent need for physical assist due to decreased seq/problem solving, pain and fear of inability to safely perform as well as increased pain.   Problem: RH Balance Goal: LTG Patient will maintain dynamic sitting balance (PT) LTG: Patient will maintain dynamic sitting balance with assistance during mobility activities (PT)  Downgraded due to need for supervision for overall safety.  Problem: RH Furniture Transfers Goal: LTG Patient will perform furniture transfers w/assist (OT/PT LTG: Patient will perform furniture transfers with assistance (OT/PT).  Goal downgraded due to consistent need for physical assist due to decreased seq/problem solving, pain and fear of inability to safely perform as well as fear of increased pain.   Problem: RH Ambulation Goal: LTG Patient will ambulate in controlled environment (PT) LTG: Patient will ambulate in a controlled environment, # of feet with assistance (PT).  Downgraded due to decreased functional endurance, pain and fear of inability to safely perform as well as fear of increased pain.   Problem: RH Wheelchair Mobility Goal: LTG Patient will propel w/c in controlled environment (PT) LTG: Patient will propel wheelchair in controlled environment, # of feet with  assist (PT)  Downgraded due to need for physical assist due to decreased functional endurance and B UE strength.

## 2013-08-02 NOTE — Plan of Care (Signed)
Problem: RH Ambulation Goal: LTG Patient will ambulate in home environment (PT) LTG: Patient will ambulate in home environment, # of feet with assistance (PT).  Outcome: Not Applicable Date Met:  74/25/95 Goal d/c as pt now plans to d/c to SNF.   Problem: RH Car Transfers Goal: LTG Patient will perform car transfers with assist (PT) LTG: Patient will perform car transfers with assistance (PT).  Outcome: Not Applicable Date Met:  63/87/56 Goal d/c as pt now plans to d/c to SNF.   Problem: RH Wheelchair Mobility Goal: LTG Patient will propel w/c in home environment (PT) LTG: Patient will propel wheelchair in home environment, # of feet with assistance (PT).  Outcome: Not Applicable Date Met:  43/32/95 Goal d/c as pt now plans to d/c to SNF.

## 2013-08-02 NOTE — Progress Notes (Signed)
Occupational Therapy Session Note  Patient Details  Name: Holly Ellison MRN: [id number] Date of Birth: Apr 01, 1940  Today's Date: 08/02/2013  Session 2 Time: 1015-1100 Time Calculation (min): 45 min  Short Term Goals: Week 2:  OT Short Term Goal 1 (Week 2): Pt will consistently transfer to [facility] with mod A OT Short Term Goal 2 (Week 2): Pt will stand with mod A to complete LB bathing tasks OT Short Term Goal 3 (Week 2): Pt will perform LB bathing with mod A using AE prn  Skilled Therapeutic Interventions/Progress Updates:    Pt resting in bed upon arrival.  PT stated that he had been up in w/c earlier.  Pt stated that she asked nursing to get her back in bed because of pain but that she wasn't in pain at the moment.  Engaged patient in bed mobility and sitting EOB.  Attempted to engaged patient in bathing and dressing tasks but wanted therapist to complete tasks.  I informed patient that she was able to do that herself.  Pt acknowledged this but stated that she "couldn't make herself do it." She knew she needed to and she wanted to but she just couldn't do it.  Pt stated that she felt like her family didn't want her.  Pt became tearful and stated "I just don't want to live anymore." Pt returned to supine position in bed with call bell within reach.  CSW notified of conversation. Focus on activity tolerance, bed mobility, and sitting balance.  Therapy Documentation Precautions:  Precautions Precautions: Back;Fall Precaution Comments: Fistula L arm Required Braces or Orthoses: Spinal Brace Restrictions Weight Bearing Restrictions: No General: General Amount of Missed OT Time (min): 15 Minutes Pain: Pain Assessment Pain Assessment: 0-10 Pain Score: 7  Pain Type: Acute pain Pain Location: Back Pain Orientation: Mid;Lower Pain Descriptors / Indicators: Aching Pain Onset: With Activity Pain Intervention(s): RN made aware;Repositioned  See FIM for current functional  status  Therapy/Group: Individual Therapy  Session 2 Time: 6440-3474 Pt c/o 8/10 back in mid back; RN aware and repositioned Individual Therapy  Pt sitting in w/c bent forward at the waist upon arrival.  Encouraged patient to sit upright in chair to protect back. Pt stated that her back hurt too much when she sat upright.  I explained to her that her LSO corset would help support her back when sitting upright.  Pt explained that the corset hurt her back also.  Pt requested to return to bed.  Encouraged patient to practice sit<>stand and standing.  Pt agreed to sit<>stand X 3 before performing stand pivot transfer with min A to bed.  Pt remained in bed with all needs within reach.  Focus on activity tolerance, sit<>stand, transfers, standing balance, and bed mobility.  Leotis Shames [facility] 08/02/2013, 11:11 AM

## 2013-08-02 NOTE — Progress Notes (Signed)
Physical Therapy Weekly Progress Note  Patient Details  Name: Holly Ellison MRN: [id number] Date of Birth: 04-08-1940  Beginning of progress report period: July 26, 2013 End of progress report period: August 02, 2013  Today's Date: 08/02/2013 Time: Treatment Session 1: 0800-0900; Treatment Session 2: 1310-1330 Time Calculation (min): Treatment Session 1: 60 min; Treatment Session 2: 50mn  Pt continues to make very slow and inconsistent gains due to inconsistent full participation in therapies due to pain vs. behavior despite max encouragement. Medical staff aware and addressing pt's pain for improved participation. Pt has consistently achieved, 2/5 STGs over past week and demonstrates inconsistent progress towards remaining 3, see details below. Pt currently req min A-(S) for bed mobility, min-mod A for squat pivot transfers, min-mod A for t/f sit<>stand, min-mod A for short distance ambulation and (S)-min A for w/c propulsion. Pt continues to demonstrate progressive return of strength in B LE, but fearful of standing mobility due to decreased sensation and proprioception in B LE. Pt's tolerance to sitting or standing greatly impacted by pain or fear of increased pain. Pt req significantly increased time for problem solving completion of functional transfers, frequently asking "is this right?" regarding set up of transfers.   Patient continues to demonstrate the following deficits: decreased functional endurance, decreased sustained attention, decreased problem solving, decreased sequencing, decreased sitting and standing balance, decreased global strength, decreased gross coordination, decreased gross motor control, decreased intellectual/emergent awareness, pain, decreased overall functional mobility and therefore will continue to benefit from skilled PT intervention to enhance overall performance with activity tolerance, balance, postural control, ability to compensate for deficits, functional use of  right upper extremity, right lower extremity, left upper extremity and left lower extremity, awareness, coordination and knowledge of precautions.  Patient not progressing toward original long term goals. Plan of care revisions: Pt's LTGs downgraded and d/c plan now changed to SNF.   PT Short Term Goals Week 1:  PT Short Term Goal 1 (Week 1): Pt will be able to complete supine to sit/sit to supine transfers with mod A to promote OOB activities PT Short Term Goal 1 - Progress (Week 1): Met PT Short Term Goal 2 (Week 1): Pt will be able to complete sit to stand/stand to sit transfers with min A.  PT Short Term Goal 2 - Progress (Week 1): Partly met PT Short Term Goal 3 (Week 1): Pt will be able to tolerate static standing for 60-75 seconds to promote increased weight bearing and pre-gait activities without knee buckling.  PT Short Term Goal 3 - Progress (Week 1): Met PT Short Term Goal 4 (Week 1): Pt will be able to ambulate 10-15 feet using the LRAD with mod A to promote increased functional mobility within the home.  PT Short Term Goal 4 - Progress (Week 1): Partly met PT Short Term Goal 5 (Week 1): Pt will be able to propel wheel chair 50 feet with SBA to promote functional mobility within the home and community. PT Short Term Goal 5 - Progress (Week 1): Progressing toward goal Week 2:  PT Short Term Goal 1 (Week 2): Pt to perform bed mobility w/ min A and use of hospital bed functions PT Short Term Goal 1 - Progress (Week 2): Met PT Short Term Goal 2 (Week 2): Pt to perform bed<>w/c transfers w/ min A and  min cueing for technique.  PT Short Term Goal 2 - Progress (Week 2): Met PT Short Term Goal 3 (Week 2): Pt will be able to propel  the wheelchair 89' w/ mod A in controlled environment PT Short Term Goal 3 - Progress (Week 2): Progressing toward goal PT Short Term Goal 4 (Week 2): Pt to transfer sit<>stand w/ min A and B UE support PT Short Term Goal 4 - Progress (Week 2): Progressing toward  goal PT Short Term Goal 5 (Week 2): Pt will be able to ambulate 20-25 feet using the LRAD with mod A  PT Short Term Goal 5 - Progress (Week 2): Progressing toward goal Week 3:  PT Short Term Goal 1 (Week 3): STGs=LTGs due to anticipated d/c to SNF  Skilled Therapeutic Interventions/Progress Updates:  Treatment Session 1:  1:1. Pt received semi-reclined in bed, initially not talking to therapist only engaging by shaking head yes/no to questions. Pt req max encouragement x15' to initiate getting OOB. Req min A w/ significantly increased time to t/f sup>sit EOB w/ use of hospital bed functions. Pt able to maintain sitting EOB w/ (S) while changing gown, pt leaning towards R side to alleviate pain in back w/ min A to regain sitting upright. Pt req increased time for problem solving B UE placement for safe t/f bed>w/c w/ mod A. Pt req max encouragement to propel w/c 8-10'x2 w/ B UE, pt frequently stopping and leaning over in seat, but very limited verbalization of back discomfort. Pt engaged in t/f sit<>stand x3 w/ RW and mod A, 5sec first 2x and then 15sec 3x. Pt not willing to stand longer or attempt ambulation, "I just can't, I just can't!" Pt would not further elaborate. Pt w/ good tolerance to brief bout of seated therex to target B LE strength, exercises included 2x10 reps of: toe/heel raises, LAQ and marching. Pt with questions regarding inability to feel her feet, education provided regarding decreased light touch and proprioception in B LE w/ importance of looking at feet during therex. Participation this session more limited by behavior vs. Pain due to limited verbalizations regarding pain throughout session. Pt sitting in w/c at end of session w/ all needs in reach and quick release belt in place, RN aware.   Treatment Session 2:  1:1. Pt missed 50mn at start of session due to nursing care regarding  incontinence and need for medication. Focus this session on functional transfers. Pt immediately  initiated getting OOB, req (S) for t/f sup>sit w/ use of hospital bed functions and scooting EOB. Pt req overall min A w/ increased time for problem solving hand placement for squat pivot transfers bed>w/c<>recliner in therapy apartment. Pt left sitting in w/c at end of session w/ quick release belt in place and all needs in reach, RN aware.   Therapy Documentation Precautions:  Precautions Precautions: Back;Fall Precaution Comments: Fistula L arm Required Braces or Orthoses: Spinal Brace Restrictions Weight Bearing Restrictions: No General: Pt missed 172m due to nursing needs Vital Signs: Therapy Vitals Temp: 98 F (36.7 C) Temp src: Oral Pulse Rate: 81 Resp: 20 BP: 132/77 mmHg Patient Position (if appropriate): Lying Oxygen Therapy SpO2: 96 % O2 Device: None (Room air) Pain: Pain Assessment Pain Score: 4   See FIM for current functional status  Therapy/Group: Individual Therapy  KiGilmore Laroche/26/2015, 5:40 PM

## 2013-08-02 NOTE — Progress Notes (Signed)
Patient ID: Holly Ellison, female   DOB: 04/14/1940, 73 y.o.   MRN: [id number] 73 y.o. female with history of DM type 2 with neuropathy, CAD, COPD, ESRD, HA, who was admitted on 07/16/13 pm with one week history of BLE, fecal incontinence and severe back pain. 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 Vertell Limber. Frozen section appeared benign and NS questions old or indolent osteomyelitis. Patient with history of MSSA bacteremia 07/2012 s/p 4 weeks of antibiotic therapy. CT chest without evidence of malignancy. HD ongoing and labs with evidence of ABLA with down to 7.1 as well as leucocytosis with WBC-16.0.    Subjective/Complaints: Confusion with LLQ pain yesterday when trying to get up with therapy. Felt better later in the day. Signed off HD early AMA. Tearful by report   Review of Systems   All other systems reviewed and are negative.  Objective: Vital Signs: Blood pressure 167/69, pulse 79, temperature 98.7 F (37.1 C), temperature source Oral, resp. rate 18, height 5' 6.14" (1.68 m), weight 69.4 kg (153 lb), SpO2 95.00%. Dg Abd 1 View  08/01/2013   CLINICAL DATA:  Abdominal pain, history diabetes, hypertension, coronary artery disease post MI, CHF  EXAM: ABDOMEN - 1 VIEW  COMPARISON:  06/05/2011  FINDINGS: Increased stool in colon.  No bowel dilatation or bowel wall thickening.  Gas in stomach.  Numerous pelvic phleboliths.  Bones demineralized.  IMPRESSION: Increased stool in colon without gross evidence of bowel obstruction or dilatation.   Electronically Signed   By: Lavonia Dana M.D.   On: 08/01/2013 10:59   Results for orders placed during the hospital encounter of 07/19/13 (from the past 72 hour(s))  GLUCOSE, CAPILLARY     Status: Abnormal   Collection Time    07/30/13 11:12 AM      Result Value Ref Range   Glucose-Capillary  136 (*) 70 - 99 mg/dL  GLUCOSE, CAPILLARY     Status: Abnormal   Collection Time    07/30/13  4:38 PM      Result Value Ref Range   Glucose-Capillary 183 (*) 70 - 99 mg/dL  CBC     Status: Abnormal   Collection Time    07/30/13  6:00 PM      Result Value Ref Range   WBC 11.7 (*) 4.0 - 10.5 K/uL   RBC 2.25 (*) 3.87 - 5.11 MIL/uL   Hemoglobin 7.2 (*) 12.0 - 15.0 g/dL   HCT 21.8 (*) 36.0 - 46.0 %   MCV 96.9  78.0 - 100.0 fL   MCH 32.0  26.0 - 34.0 pg   MCHC 33.0  30.0 - 36.0 g/dL   RDW 16.4 (*) 11.5 - 15.5 %   Platelets 358  150 - 400 K/uL  RENAL FUNCTION PANEL     Status: Abnormal   Collection Time    07/30/13  6:00 PM      Result Value Ref Range   Sodium 132 (*) 137 - 147 mEq/L   Potassium 4.1  3.7 - 5.3 mEq/L   Chloride 90 (*) 96 - 112 mEq/L   CO2 26  19 - 32 mEq/L   Glucose, Bld 129 (*) 70 - 99 mg/dL   BUN 46 (*) 6 - 23 mg/dL   Creatinine, Ser 5.76 (*) 0.50 - 1.10 mg/dL   Calcium 9.0  8.4 - 10.5 mg/dL   Phosphorus 4.3  2.3 - 4.6 mg/dL   Albumin 2.7 (*) 3.5 - 5.2 g/dL   GFR calc non Af Amer 7 (*) >90 mL/min   GFR calc Af Amer 8 (*) >90 mL/min   Comment: (NOTE)     The eGFR has been calculated using the CKD EPI equation.     This calculation has not been validated in all clinical situations.     eGFR's persistently <90 mL/min signify possible Chronic Kidney     Disease.  GLUCOSE, CAPILLARY     Status: None   Collection Time    07/30/13 11:49 PM      Result Value Ref Range   Glucose-Capillary 94  70 - 99 mg/dL   Comment 1 Notify RN    GLUCOSE, CAPILLARY     Status: Abnormal   Collection Time    07/31/13  6:29 AM      Result Value Ref Range   Glucose-Capillary 149 (*) 70 - 99 mg/dL   Comment 1 Notify RN    GLUCOSE, CAPILLARY     Status: Abnormal   Collection Time    07/31/13  7:18 AM      Result Value Ref Range   Glucose-Capillary 108 (*) 70 - 99 mg/dL  GLUCOSE, CAPILLARY     Status: Abnormal   Collection Time    07/31/13 11:25 AM      Result Value Ref Range    Glucose-Capillary 138 (*) 70 - 99 mg/dL  GLUCOSE, CAPILLARY     Status: Abnormal   Collection Time    07/31/13  4:36 PM      Result Value Ref Range   Glucose-Capillary 66 (*) 70 - 99 mg/dL  GLUCOSE, CAPILLARY     Status: None   Collection Time    07/31/13  5:09 PM      Result Value Ref Range   Glucose-Capillary 99  70 - 99 mg/dL  GLUCOSE, CAPILLARY     Status: Abnormal   Collection Time    07/31/13  9:06 PM      Result Value Ref Range   Glucose-Capillary 123 (*) 70 - 99 mg/dL  GLUCOSE, CAPILLARY     Status: Abnormal   Collection Time    08/01/13  7:18 AM      Result Value Ref Range   Glucose-Capillary 46 (*) 70 - 99 mg/dL   Comment 1 Notify RN    GLUCOSE, CAPILLARY     Status: Abnormal   Collection Time    08/01/13  7:45 AM      Result Value Ref Range   Glucose-Capillary 58 (*) 70 - 99 mg/dL   Comment 1 Notify RN    GLUCOSE, CAPILLARY     Status: None   Collection Time    08/01/13  8:11 AM      Result Value Ref Range   Glucose-Capillary 73  70 - 99 mg/dL   Comment 1 Notify RN    GLUCOSE, CAPILLARY     Status: None   Collection Time    08/01/13 11:18 AM      Result Value Ref Range   Glucose-Capillary 80  70 - 99 mg/dL   Comment 1 Notify RN    CBC     Status: Abnormal   Collection Time    08/01/13  4:00 PM      Result Value Ref Range   WBC 11.3 (*) 4.0 - 10.5 K/uL   RBC 2.48 (*) 3.87 - 5.11 MIL/uL   Hemoglobin 8.1 (*) 12.0 -  15.0 g/dL   HCT 24.9 (*) 36.0 - 46.0 %   MCV 100.4 (*) 78.0 - 100.0 fL   MCH 32.7  26.0 - 34.0 pg   MCHC 32.5  30.0 - 36.0 g/dL   RDW 16.8 (*) 11.5 - 15.5 %   Platelets 350  150 - 400 K/uL  RENAL FUNCTION PANEL     Status: Abnormal   Collection Time    08/01/13  4:00 PM      Result Value Ref Range   Sodium 135 (*) 137 - 147 mEq/L   Potassium 3.6 (*) 3.7 - 5.3 mEq/L   Chloride 92 (*) 96 - 112 mEq/L   CO2 24  19 - 32 mEq/L   Glucose, Bld 187 (*) 70 - 99 mg/dL   BUN 25 (*) 6 - 23 mg/dL   Comment: DELTA CHECK NOTED   Creatinine, Ser  3.82 (*) 0.50 - 1.10 mg/dL   Comment: DELTA CHECK NOTED   Calcium 8.9  8.4 - 10.5 mg/dL   Phosphorus 4.4  2.3 - 4.6 mg/dL   Albumin 2.8 (*) 3.5 - 5.2 g/dL   GFR calc non Af Amer 11 (*) >90 mL/min   GFR calc Af Amer 13 (*) >90 mL/min   Comment: (NOTE)     The eGFR has been calculated using the CKD EPI equation.     This calculation has not been validated in all clinical situations.     eGFR's persistently <90 mL/min signify possible Chronic Kidney     Disease.  GLUCOSE, CAPILLARY     Status: Abnormal   Collection Time    08/01/13  4:19 PM      Result Value Ref Range   Glucose-Capillary 112 (*) 70 - 99 mg/dL  GLUCOSE, CAPILLARY     Status: None   Collection Time    08/01/13  9:19 PM      Result Value Ref Range   Glucose-Capillary 84  70 - 99 mg/dL  GLUCOSE, CAPILLARY     Status: Abnormal   Collection Time    08/02/13 12:24 AM      Result Value Ref Range   Glucose-Capillary 124 (*) 70 - 99 mg/dL  GLUCOSE, CAPILLARY     Status: None   Collection Time    08/02/13  7:39 AM      Result Value Ref Range   Glucose-Capillary 99  70 - 99 mg/dL   Comment 1 Notify RN        Nursing note and vitals reviewed.  Constitutional: She is oriented to person, place, and time. She appears well-developed and well-nourished. She is sleeping. She is easily aroused.  HENT:  Head: Normocephalic and atraumatic.  Eyes: Conjunctivae are normal. Pupils are equal, round, and reactive to light.  Neck: Normal range of motion. Neck supple.  Cardiovascular: Normal rate and regular rhythm.  Respiratory: Effort normal and breath sounds normal. No respiratory distress. She has no wheezes.  GI: Soft. Bowel sounds are normal. She exhibits no distension. There is no tenderness.  Musculoskeletal: She exhibits no edema, back remains tender.  Neurological: She is oriented to person, cooperative. Follows commands. Fair insight.  Bilateral hands with thenar atrophy and contracture with decrease in fine motor movements.  Numbness stocking-glove distribution persists.  Skin: Skin is warm and dry.  Back incision remains dry/ clean 4 minus bilateral deltoid, bicep, tricep 3 minus bilateral finger flexors and extensors  2 minus bilateral hip flexors 3 minus bilaterally knee extensors 2 minus bilateral ankle dorsiflexors and  plantar flexors Psych: sl confusion, calm,cooperative  Assessment/Plan: 1. Functional deficits secondary to T11 lesion with paraplegia which require 3+ hours per day of interdisciplinary therapy in a comprehensive inpatient rehab setting. Physiatrist is providing close team supervision and 24 hour management of active medical problems listed below. Physiatrist and rehab team continue to assess barriers to discharge/monitor patient progress toward functional and medical goals.  May need placement given inconsistent participation and fluctuating cognitive status. Will d/w team  FIM: FIM - Bathing Bathing Steps Patient Completed: Chest;Right Arm;Left Arm;Abdomen;Front perineal area (bed level) Bathing: 3: Mod-Patient completes 5-7 [id number] parts or 50-74% (bed level)  FIM - Upper Body Dressing/Undressing Upper body dressing/undressing steps patient completed: Thread/unthread right sleeve of pullover shirt/dresss;Thread/unthread left sleeve of pullover shirt/dress (bed level) Upper body dressing/undressing: 3: Mod-Patient completed 50-74% of tasks (bed level) FIM - Lower Body Dressing/Undressing Lower body dressing/undressing steps patient completed: Thread/unthread left underwear leg;Thread/unthread left pants leg Lower body dressing/undressing: 0: Activity did not occur (Bowel issues and too much back pain)  FIM - Toileting Toileting steps completed by patient: Adjust clothing prior to toileting;Performs perineal hygiene;Adjust clothing after toileting Toileting Assistive Devices: Grab bar or rail for support Toileting: 0: Activity did not occur  FIM - TEngineer, structuralDevices: Grab bars Toilet Transfers: 0-Activity did not occur  FIM - BControl and instrumentation engineerDevices: Bed rails;HOB elevated Bed/Chair Transfer: 2: Supine > Sit: Max A (lifting assist/Pt. 25-49%);2: Sit > Supine: Max A (lifting assist/Pt. 25-49%);2: Bed > Chair or W/C: Max A (lift and lower assist);2: Chair or W/C > Bed: Max A (lift and lower assist)  FIM - Locomotion: Wheelchair Distance: 60 Locomotion: Wheelchair: 1: Travels less than 50 ft with moderate assistance (Pt: 50 - 74%) FIM - Locomotion: Ambulation Locomotion: Ambulation Assistive Devices: Parallel bars Ambulation/Gait Assistance: 4: Min assist Locomotion: Ambulation: 1: Travels less than 50 ft with minimal assistance (Pt.>75%)  Comprehension Comprehension Mode: Auditory Comprehension: 4-Understands basic 75 - 89% of the time/requires cueing 10 - 24% of the time  Expression Expression Mode: Verbal Expression: 4-Expresses basic 75 - 89% of the time/requires cueing 10 - 24% of the time. Needs helper to occlude trach/needs to repeat words.  Social Interaction Social Interaction: 3-Interacts appropriately 50 - 74% of the time - May be physically or verbally inappropriate.  Problem Solving Problem Solving: 2-Solves basic 25 - 49% of the time - needs direction more than half the time to initiate, plan or complete simple activities  Memory Memory: 3-Recognizes or recalls 50 - 74% of the time/requires cueing 25 - 49% of the time  Medical Problem List and Plan:  1. 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  2. DVT Prophylaxis/Anticoagulation: Mechanical: Antiembolism stockings, thigh (TED hose) Bilateral lower extremities  Sequential compression devices, below knee Bilateral lower extremities  3. Pain Management: lidoderm patches for back.   -utilize, tylenol, ice, heat--   -resumed LOW dose scheduled  hydrocodone at 7am and 12 noon each day to coincide with therapy 4. H/o depression/Mood: Resume lexapro. LCSW to follow for evaluation and support.  5. Neuropsych: This patient is not currently capable of making decisions on her own behalf.  6. ESRD: Continue HD on TTS after therapy sessions.  7. DM type 2 with peripheral neuropathy:  Continue lantus insulin with SSI for elevated BS. Inconsistent PO -hypoglycemic yesterday  -decrease lantus to 10units 8. CAD: Continue plavix, lipitor, lopressor and ranexa.  9. Leucocytosis:  Monitor for signs of infection. WBC 11.3 10. HTN:  norvasc  25m daily---increase to 114m   -  Prn po clonidine in place.  11. Acute on chronic anemia: On aranesp. Transfusion with dialysis prn.  12. AMS: baseline dementia. Periodic confusion and agitation usually associated with HD, pain, sometimes hypoglycemia    -will need to be conservative with pain measures.   LOS (Days) 14 A FACE TO FACE EVALUATION WAS PERFORMED  SWARTZ,ZACHARY T 08/02/2013, 7:55 AM

## 2013-08-03 ENCOUNTER — Inpatient Hospital Stay (HOSPITAL_COMMUNITY): Payer: Medicare Other | Admitting: Physical Therapy

## 2013-08-03 DIAGNOSIS — F0391 Unspecified dementia with behavioral disturbance: Secondary | ICD-10-CM

## 2013-08-03 DIAGNOSIS — I158 Other secondary hypertension: Secondary | ICD-10-CM

## 2013-08-03 DIAGNOSIS — N058 Unspecified nephritic syndrome with other morphologic changes: Secondary | ICD-10-CM

## 2013-08-03 DIAGNOSIS — I25810 Atherosclerosis of coronary artery bypass graft(s) without angina pectoris: Secondary | ICD-10-CM

## 2013-08-03 DIAGNOSIS — F03918 Unspecified dementia, unspecified severity, with other behavioral disturbance: Secondary | ICD-10-CM

## 2013-08-03 DIAGNOSIS — G959 Disease of spinal cord, unspecified: Secondary | ICD-10-CM

## 2013-08-03 DIAGNOSIS — Z992 Dependence on renal dialysis: Secondary | ICD-10-CM

## 2013-08-03 DIAGNOSIS — N186 End stage renal disease: Secondary | ICD-10-CM

## 2013-08-03 DIAGNOSIS — E1329 Other specified diabetes mellitus with other diabetic kidney complication: Secondary | ICD-10-CM

## 2013-08-03 DIAGNOSIS — G609 Hereditary and idiopathic neuropathy, unspecified: Secondary | ICD-10-CM

## 2013-08-03 LAB — RENAL FUNCTION PANEL
ALBUMIN: 2.9 g/dL — AB (ref 3.5–5.2)
BUN: 26 mg/dL — ABNORMAL HIGH (ref 6–23)
CO2: 25 mEq/L (ref 19–32)
CREATININE: 4.05 mg/dL — AB (ref 0.50–1.10)
Calcium: 9.1 mg/dL (ref 8.4–10.5)
Chloride: 93 mEq/L — ABNORMAL LOW (ref 96–112)
GFR calc Af Amer: 12 mL/min — ABNORMAL LOW (ref >90)
GFR calc non Af Amer: 10 mL/min — ABNORMAL LOW (ref >90)
Glucose, Bld: 126 mg/dL — ABNORMAL HIGH (ref 70–99)
PHOSPHORUS: 4.2 mg/dL (ref 2.3–4.6)
Potassium: 3.7 mEq/L (ref 3.7–5.3)
Sodium: 135 mEq/L — ABNORMAL LOW (ref 137–147)

## 2013-08-03 LAB — CBC
HEMATOCRIT: 24.6 % — AB (ref 36.0–46.0)
Hemoglobin: 8 g/dL — ABNORMAL LOW (ref 12.0–15.0)
MCH: 32.8 pg (ref 26.0–34.0)
MCHC: 32.5 g/dL (ref 30.0–36.0)
MCV: 100.8 fL — AB (ref 78.0–100.0)
PLATELETS: 347 10*3/uL (ref 150–400)
RBC: 2.44 MIL/uL — ABNORMAL LOW (ref 3.87–5.11)
RDW: 16.6 % — AB (ref 11.5–15.5)
WBC: 8.8 10*3/uL (ref 4.0–10.5)

## 2013-08-03 LAB — GLUCOSE, CAPILLARY
GLUCOSE-CAPILLARY: 191 mg/dL — AB (ref 70–99)
Glucose-Capillary: 109 mg/dL — ABNORMAL HIGH (ref 70–99)
Glucose-Capillary: 94 mg/dL (ref 70–99)

## 2013-08-03 MED ORDER — ACETAMINOPHEN 325 MG PO TABS
ORAL_TABLET | ORAL | Status: AC
Start: 2013-08-03 — End: 2013-08-03
  Filled 2013-08-03: qty 2

## 2013-08-03 NOTE — Progress Notes (Signed)
Patient yelling out around 2200, she can't go to sleep and needs something to help her sleep, scheduled Trazodone was given at 2114, gave patient Xanax 0.25mg  at 2211 with minimal results. Patient continued to yell out and repeatedly say "they won't let me go to sleep, all I want to do is close my eyes and sleep." Patient eventually fell asleep around 2315. But wakes up intermittently saying "they won't let me sleep." Guided patient through some deep breathing exercises in attempts to calm patient and hopfully fall asleep, effective at times, will continue to monitor. Rayden Scheper, Dione Plover

## 2013-08-03 NOTE — Progress Notes (Signed)
Pt signed off early from dialysis with 30 min left. Dr Moshe Cipro paged no return call

## 2013-08-03 NOTE — Progress Notes (Signed)
Physical Therapy Session Note  Patient Details  Name: Holly Ellison MRN: [id number] Date of Birth: 04/20/1940  Today's Date: 08/03/2013 Time: 1450   Short Term Goals: Week 3:  PT Short Term Goal 1 (Week 3): STGs=LTGs due to anticipated d/c to SNF  Skilled Therapeutic Interventions/Progress Updates:   Pt received supine in bed, refused to participate in 45 minutes of skilled physical therapy session. Pt stating, "I can't do it! I want to die!" Patient reporting lack of sleep and decreased appetite. Patient education provided regarding benefits of activity/getting OOB to aide in sleep and appetite. Pt verbalized understanding, continued to refuse therapy. Will f/u per POC.   Therapy Documentation Precautions:  Precautions Precautions: Back;Fall Precaution Comments: Fistula L arm Required Braces or Orthoses: Spinal Brace Restrictions Weight Bearing Restrictions: No General: Amount of Missed PT Time (min): 45 Minutes Missed Time Reason: Patient unwilling/refused to participate without medical reason  See FIM for current functional status  Therapy/Group: Individual Therapy  Laretta Alstrom 08/03/2013, 2:59 PM

## 2013-08-03 NOTE — Progress Notes (Signed)
[facility] ROUNDING NOTE   Subjective:   Interval History: resting today   Objective:  Vital signs in last 24 hours:  Temp:  [97.8 F (36.6 C)-99.4 F (37.4 C)] 97.8 F (36.6 C) (06/27 0622) Pulse Rate:  [67-88] 67 (06/27 0622) Resp:  [18-20] 18 (06/27 0622) BP: (132-169)/(61-77) 153/70 mmHg (06/27 0622) SpO2:  [94 %-100 %] 94 % (06/27 0622)  Weight change:  Filed Weights   08/01/13 1719 08/01/13 2033 08/02/13 0500  Weight: 72 kg (158 lb 11.7 oz) 70.2 kg (154 lb 12.2 oz) 69.4 kg (153 lb)    Intake/Output: I/O last 3 completed shifts: In: 300 [P.O.:300] Out: 1466 [Other:1466]   Intake/Output this shift:     CVS- RRR RS- CTA ABD- BS present soft non-distended EXT- no edema   Basic Metabolic Panel:  Recent Labs Lab 07/27/13 1649 07/30/13 1800 08/01/13 1600  NA 133* 132* 135*  K 3.8 4.1 3.6*  CL 93* 90* 92*  CO2 28 26 24   GLUCOSE 113* 129* 187*  BUN 32* 46* 25*  CREATININE 3.90* 5.76* 3.82*  CALCIUM 9.8 9.0 8.9  PHOS 2.4 4.3 4.4    Liver Function Tests:  Recent Labs Lab 07/27/13 1649 07/30/13 1800 08/01/13 1600  ALBUMIN 2.7* 2.7* 2.8*   No results found for this basename: LIPASE, AMYLASE,  in the last 168 hours No results found for this basename: AMMONIA,  in the last 168 hours  CBC:  Recent Labs Lab 07/27/13 1645 07/28/13 0335 07/30/13 1800 08/01/13 1600  WBC 11.5* 10.4 11.7* 11.3*  HGB 7.4* 8.5* 7.2* 8.1*  HCT 22.5* 26.1* 21.8* 24.9*  MCV 99.6 100.4* 96.9 100.4*  PLT 366 389 358 350    Cardiac Enzymes: No results found for this basename: CKTOTAL, CKMB, CKMBINDEX, TROPONINI,  in the last 168 hours  BNP: No components found with this basename: POCBNP,   CBG:  Recent Labs Lab 08/02/13 0739 08/02/13 1217 08/02/13 1619 08/02/13 2055 08/03/13 0732  GLUCAP 99 151* 114* 171* 109*    Microbiology: Results for orders placed during the hospital encounter of 07/16/13  MRSA PCR SCREENING     Status: None   Collection Time    07/17/13  3:16 AM      Result Value Ref Range Status   MRSA by PCR NEGATIVE  NEGATIVE Final   Comment:            The GeneXpert MRSA Assay (FDA     approved for NASAL specimens     only), is one component of a     comprehensive MRSA colonization     surveillance program. It is not     intended to diagnose MRSA     infection nor to guide or     monitor treatment for     MRSA infections.  CULTURE, BLOOD (ROUTINE X 2)     Status: None   Collection Time    07/17/13 11:00 AM      Result Value Ref Range Status   Specimen Description BLOOD LEFT HAND   Final   Special Requests BOTTLES DRAWN AEROBIC ONLY 3CC   Final   Culture  Setup Time     Final   Value: 07/17/2013 16:30     Performed at [facility]   Culture     Final   Value: NO GROWTH 5 DAYS     Performed at [facility]   Report Status 07/23/2013 FINAL   Final  CULTURE, BLOOD (ROUTINE X 2)  Status: None   Collection Time    07/17/13 11:10 AM      Result Value Ref Range Status   Specimen Description BLOOD THUMB LEFT   Final   Special Requests BOTTLES DRAWN AEROBIC ONLY 6CC   Final   Culture  Setup Time     Final   Value: 07/17/2013 16:29     Performed at [facility]   Culture     Final   Value: NO GROWTH 5 DAYS     Performed at [facility]   Report Status 07/23/2013 FINAL   Final    Coagulation Studies: No results found for this basename: LABPROT, INR,  in the last 72 hours  Urinalysis: No results found for this basename: COLORURINE, APPERANCEUR, LABSPEC, PHURINE, GLUCOSEU, HGBUR, BILIRUBINUR, KETONESUR, PROTEINUR, UROBILINOGEN, NITRITE, LEUKOCYTESUR,  in the last 72 hours    Imaging: Dg Abd 1 View  08/01/2013   CLINICAL DATA:  Abdominal pain, history diabetes, hypertension, coronary artery disease post MI, CHF  EXAM: ABDOMEN - 1 VIEW  COMPARISON:  06/05/2011  FINDINGS: Increased stool in colon.  No bowel dilatation or bowel wall thickening.  Gas in stomach.   Numerous pelvic phleboliths.  Bones demineralized.  IMPRESSION: Increased stool in colon without gross evidence of bowel obstruction or dilatation.   Electronically Signed   By: Lavonia Dana M.D.   On: 08/01/2013 10:59     Medications:     . amLODipine  5 mg Oral Daily  . aspirin EC  81 mg Oral Daily  . atorvastatin  40 mg Oral q1800  . bisacodyl  10 mg Rectal Q0600  . clopidogrel  75 mg Oral Daily  . darbepoetin (ARANESP) injection - DIALYSIS  100 mcg Intravenous Q Thu-HD  . docusate sodium  100 mg Oral BID  . feeding supplement (RESOURCE BREEZE)  1 Container Oral TID BM  . HYDROcodone-acetaminophen  0.5 tablet Oral BID  . insulin aspart  0-9 Units Subcutaneous TID WC  . insulin glargine  10 Units Subcutaneous Daily  . lidocaine  2 patch Transdermal Q24H  . methimazole  10 mg Oral Daily  . metoprolol  50 mg Oral BID  . multivitamin  1 tablet Oral QHS  . pantoprazole  40 mg Oral Daily  . ranolazine  500 mg Oral BID  . traZODone  50 mg Oral QHS   acetaminophen, albuterol, ALPRAZolam, calcium carbonate, cloNIDine, dextrose, menthol-cetylpyridinium, ondansetron (ZOFRAN) IV, phenol, polyethylene glycol, simethicone  Assessment/ Plan:   1. Recent AMS - now resolved, believed to be sec to meds; recommended only Tylenol for pain, but pain poorly controlled.  2. 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  3. Depression - Lexapro started this admission was dc'd, believed to contribute to AMS, but resumed yesterday.  4. ESRD - HD on TTS plan dialysis today. 5. HTN/Volume - BP 150/70 on Amlodipine 5 mg qd, Metoprolol 50 mg bid; wt 73.9 kg, @ EDW.  6. Anemia - Hgb down to 8.1, s/p 1 U PRBCs 6/20, Aranesp 100 mcg on Thurs. 7. Sec HPT - Ca 9 (10 corrected), P 4.3; Hectorol 4 mcg on hold, Tums with meals.  8. Nutrition - Alb down to 2.7, renal carb-mod diet,  vitamin.  9. CAD - 7 stents, CABG, on Ranexa.  10. DM - insulin per primary.   LOS: 15 WEBB,MARTIN W @TODAY @9 :59 AM

## 2013-08-03 NOTE — Progress Notes (Signed)
Benay Pomeroy is a 73 y.o. female [birth year] [id number]  Subjective:  Tearful." My stomach hurts. They tried to kill me last night!"  Objective: Vital signs in last 24 hours: Temp:  [97.8 F (36.6 C)-99.4 F (37.4 C)] 97.8 F (36.6 C) (06/27 0622) Pulse Rate:  [67-88] 67 (06/27 0622) Resp:  [18-20] 18 (06/27 0622) BP: (132-169)/(61-77) 153/70 mmHg (06/27 0622) SpO2:  [94 %-100 %] 94 % (06/27 0622) Weight change:  Last BM Date: 07/31/13  Intake/Output from previous day: 06/26 0701 - 06/27 0700 In: 180 [P.O.:180] Out: -  Last cbgs: CBG (last 3)   Recent Labs  08/02/13 1619 08/02/13 2055 08/03/13 0732  GLUCAP 114* 171* 109*     Physical Exam General: No apparent distress   HEENT: not dry Lungs: Normal effort. Lungs clear to auscultation, no crackles or wheezes. Cardiovascular: Regular rate and rhythm, no edema Abdomen: S/NT/ND; BS(+) Musculoskeletal:  unchanged Neurological: No new neurological deficits Wounds: N/A    Skin: clear  Aging changes Mental state: Alert, agitated, dilusional    Lab Results: BMET    Component Value Date/Time   NA 135* 08/01/2013 1600   K 3.6* 08/01/2013 1600   CL 92* 08/01/2013 1600   CO2 24 08/01/2013 1600   GLUCOSE 187* 08/01/2013 1600   BUN 25* 08/01/2013 1600   CREATININE 3.82* 08/01/2013 1600   CALCIUM 8.9 08/01/2013 1600   GFRNONAA 11* 08/01/2013 1600   GFRAA 13* 08/01/2013 1600   CBC    Component Value Date/Time   WBC 11.3* 08/01/2013 1600   RBC 2.48* 08/01/2013 1600   RBC 2.66* 06/05/2011 0156   HGB 8.1* 08/01/2013 1600   HCT 24.9* 08/01/2013 1600   PLT 350 08/01/2013 1600   MCV 100.4* 08/01/2013 1600   MCH 32.7 08/01/2013 1600   MCHC 32.5 08/01/2013 1600   RDW 16.8* 08/01/2013 1600   LYMPHSABS 1.5 07/17/2013 1110   MONOABS 0.3 07/17/2013 1110   EOSABS 0.0 07/17/2013 1110   BASOSABS 0.0 07/17/2013 1110    Studies/Results: Dg Abd 1 View  08/01/2013   CLINICAL DATA:  Abdominal pain, history diabetes, hypertension, coronary  artery disease post MI, CHF  EXAM: ABDOMEN - 1 VIEW  COMPARISON:  06/05/2011  FINDINGS: Increased stool in colon.  No bowel dilatation or bowel wall thickening.  Gas in stomach.  Numerous pelvic phleboliths.  Bones demineralized.  IMPRESSION: Increased stool in colon without gross evidence of bowel obstruction or dilatation.   Electronically Signed   By: Lavonia Dana M.D.   On: 08/01/2013 10:59    Medications: I have reviewed the patient's current medications.  Assessment/Plan:    1. 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  2. DVT Prophylaxis/Anticoagulation: Mechanical: Antiembolism stockings, thigh (TED hose) Bilateral lower extremities  Sequential compression devices, below knee Bilateral lower extremities  3. Pain Management: lidoderm patches for back.  -utilize, tylenol, ice, heat--  -resumed LOW dose scheduled hydrocodone at 7am and 12 noon each day to coincide with therapy  4. H/o depression/Mood: Resume lexapro. LCSW to follow for evaluation and support.  5. Neuropsych: This patient is not currently capable of making decisions on her own behalf.  6. ESRD: Continue HD on TTS after therapy sessions.  7. DM type 2 with peripheral neuropathy: Continue lantus insulin with SSI for elevated BS. Inconsistent PO -hypoglycemic yesterday  -decrease lantus to 10units  8. CAD: Continue plavix, lipitor, lopressor and ranexa.  9. Leucocytosis: Monitor for signs of infection. WBC  11.3  10. HTN: norvasc 5mg  daily---increase to 10mg   - Prn po clonidine in place.  11. Acute on chronic anemia: On aranesp. Transfusion with dialysis prn.  12. AMS: baseline dementia. Periodic confusion and agitation usually associated with HD, pain, sometimes hypoglycemia  -will need to be conservative with pain measures. -will watch. Consider Seroquel if more delusional 13. Abd pain complaint: normal exam. Will w/up if cont to  complain   Length of stay, days: 15  Walker Kehr , MD 08/03/2013, 9:19 AM

## 2013-08-03 NOTE — Progress Notes (Signed)
Pulling large clots out of venous site, cannulated ven site x4 using sharp 15 gauge needles, arterial site cannulated with sharps as well, d./t bleeding  Profusely from exit site.

## 2013-08-04 ENCOUNTER — Inpatient Hospital Stay (HOSPITAL_COMMUNITY): Payer: Medicare Other | Admitting: Physical Therapy

## 2013-08-04 LAB — GLUCOSE, CAPILLARY
GLUCOSE-CAPILLARY: 224 mg/dL — AB (ref 70–99)
Glucose-Capillary: 107 mg/dL — ABNORMAL HIGH (ref 70–99)
Glucose-Capillary: 162 mg/dL — ABNORMAL HIGH (ref 70–99)
Glucose-Capillary: 139 mg/dL — ABNORMAL HIGH (ref 70–99)

## 2013-08-04 NOTE — Progress Notes (Signed)
[facility] ROUNDING NOTE   Subjective:   Interval History: visitors in room  Appears brighter  Objective:  Vital signs in last 24 hours:  Temp:  [98.2 F (36.8 C)-99.4 F (37.4 C)] 98.2 F (36.8 C) (06/28 0551) Pulse Rate:  [79-102] 87 (06/28 1001) Resp:  [16-18] 18 (06/28 0551) BP: (93-197)/(45-87) 117/69 mmHg (06/28 1001) SpO2:  [95 %-96 %] 95 % (06/28 0551) Weight:  [67.7 kg (149 lb 4 oz)-70.1 kg (154 lb 8.7 oz)] 67.9 kg (149 lb 11.1 oz) (06/28 0647)  Weight change:  Filed Weights   08/03/13 1615 08/03/13 2013 08/04/13 0647  Weight: 70.1 kg (154 lb 8.7 oz) 67.7 kg (149 lb 4 oz) 67.9 kg (149 lb 11.1 oz)    Intake/Output: I/O last 3 completed shifts: In: -  Out: 2261 [Other:2261]   Intake/Output this shift:     CVS- RRR RS- CTA ABD- BS present soft non-distended EXT- no edema   Basic Metabolic Panel:  Recent Labs Lab 07/30/13 1800 08/01/13 1600 08/03/13 1638  NA 132* 135* 135*  K 4.1 3.6* 3.7  CL 90* 92* 93*  CO2 26 24 25   GLUCOSE 129* 187* 126*  BUN 46* 25* 26*  CREATININE 5.76* 3.82* 4.05*  CALCIUM 9.0 8.9 9.1  PHOS 4.3 4.4 4.2    Liver Function Tests:  Recent Labs Lab 07/30/13 1800 08/01/13 1600 08/03/13 1638  ALBUMIN 2.7* 2.8* 2.9*   No results found for this basename: LIPASE, AMYLASE,  in the last 168 hours No results found for this basename: AMMONIA,  in the last 168 hours  CBC:  Recent Labs Lab 07/30/13 1800 08/01/13 1600 08/03/13 1639  WBC 11.7* 11.3* 8.8  HGB 7.2* 8.1* 8.0*  HCT 21.8* 24.9* 24.6*  MCV 96.9 100.4* 100.8*  PLT 358 350 347    Cardiac Enzymes: No results found for this basename: CKTOTAL, CKMB, CKMBINDEX, TROPONINI,  in the last 168 hours  BNP: No components found with this basename: POCBNP,   CBG:  Recent Labs Lab 08/03/13 0732 08/03/13 1153 08/03/13 2058 08/04/13 0723 08/04/13 1133  GLUCAP 109* 191* 94 107* 162*    Microbiology: Results for orders placed during the hospital  encounter of 07/16/13  MRSA PCR SCREENING     Status: None   Collection Time    07/17/13  3:16 AM      Result Value Ref Range Status   MRSA by PCR NEGATIVE  NEGATIVE Final   Comment:            The GeneXpert MRSA Assay (FDA     approved for NASAL specimens     only), is one component of a     comprehensive MRSA colonization     surveillance program. It is not     intended to diagnose MRSA     infection nor to guide or     monitor treatment for     MRSA infections.  CULTURE, BLOOD (ROUTINE X 2)     Status: None   Collection Time    07/17/13 11:00 AM      Result Value Ref Range Status   Specimen Description BLOOD LEFT HAND   Final   Special Requests BOTTLES DRAWN AEROBIC ONLY 3CC   Final   Culture  Setup Time     Final   Value: 07/17/2013 16:30     Performed at [facility]   Culture     Final   Value: NO GROWTH 5 DAYS     Performed  at [facility]   Report Status 07/23/2013 FINAL   Final  CULTURE, BLOOD (ROUTINE X 2)     Status: None   Collection Time    07/17/13 11:10 AM      Result Value Ref Range Status   Specimen Description BLOOD THUMB LEFT   Final   Special Requests BOTTLES DRAWN AEROBIC ONLY 6CC   Final   Culture  Setup Time     Final   Value: 07/17/2013 16:29     Performed at [facility]   Culture     Final   Value: NO GROWTH 5 DAYS     Performed at [facility]   Report Status 07/23/2013 FINAL   Final    Coagulation Studies: No results found for this basename: LABPROT, INR,  in the last 72 hours  Urinalysis: No results found for this basename: COLORURINE, APPERANCEUR, LABSPEC, PHURINE, GLUCOSEU, HGBUR, BILIRUBINUR, KETONESUR, PROTEINUR, UROBILINOGEN, NITRITE, LEUKOCYTESUR,  in the last 72 hours    Imaging: No results found.   Medications:     . amLODipine  5 mg Oral Daily  . aspirin EC  81 mg Oral Daily  . atorvastatin  40 mg Oral q1800  . bisacodyl  10 mg Rectal Q0600  . clopidogrel  75 mg Oral Daily  .  darbepoetin (ARANESP) injection - DIALYSIS  100 mcg Intravenous Q Thu-HD  . docusate sodium  100 mg Oral BID  . feeding supplement (RESOURCE BREEZE)  1 Container Oral TID BM  . HYDROcodone-acetaminophen  0.5 tablet Oral BID  . insulin aspart  0-9 Units Subcutaneous TID WC  . insulin glargine  10 Units Subcutaneous Daily  . lidocaine  2 patch Transdermal Q24H  . methimazole  10 mg Oral Daily  . metoprolol  50 mg Oral BID  . multivitamin  1 tablet Oral QHS  . pantoprazole  40 mg Oral Daily  . ranolazine  500 mg Oral BID  . traZODone  50 mg Oral QHS   acetaminophen, albuterol, ALPRAZolam, calcium carbonate, cloNIDine, dextrose, menthol-cetylpyridinium, ondansetron (ZOFRAN) IV, phenol, polyethylene glycol, simethicone      LOS: 16  Assessment/ Plan:   1. Recent AMS - improved but still appears depressed  2. 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  3. Depression - Lexapro started this admission was dc'd  4. ESRD - HD on TTS plan dialysis 5. HTN/Volume - BP 117/69  on Amlodipine 5 mg qd, Metoprolol 50 mg bid; wt 73.9 kg, @ EDW.  6. Anemia - Hgb down to 8.0, s/p 1 U PRBCs 6/20, Aranesp 100 mcg on Thurs. 7. Sec HPT - Ca 8.9, P 4.4; Hectorol 4 mcg on hold, Tums with meals.  8. Nutrition - Alb down to 2.9  renal carb-mod diet, vitamin.  9. CAD - 7 stents, CABG, on Ranexa.  10. DM - insulin per primary.  Holly Ellison W @TODAY @11 :50 AM

## 2013-08-04 NOTE — Plan of Care (Signed)
Problem: SCI BOWEL ELIMINATION Goal: RH STG MANAGE BOWEL WITH ASSISTANCE STG Manage Bowel with Total Assistance.  Outcome: Not Progressing Patient had suppository with no results- refusing to get out of bed

## 2013-08-04 NOTE — Progress Notes (Signed)
Physical Therapy Session Note  Patient Details  Name: Holly Ellison MRN: [id number] Date of Birth: [birth year]  Today's Date: 08/04/2013 Time: 3300-7622 Time Calculation (min): 45 min  Short Term Goals: Week 2:  PT Short Term Goal 1 (Week 2): Pt to perform bed mobility w/ min A and use of hospital bed functions PT Short Term Goal 1 - Progress (Week 2): Met PT Short Term Goal 2 (Week 2): Pt to perform bed<>w/c transfers w/ min A and  min cueing for technique.  PT Short Term Goal 2 - Progress (Week 2): Met PT Short Term Goal 3 (Week 2): Pt will be able to propel the wheelchair 50' w/ mod A in controlled environment PT Short Term Goal 3 - Progress (Week 2): Progressing toward goal PT Short Term Goal 4 (Week 2): Pt to transfer sit<>stand w/ min A and B UE support PT Short Term Goal 4 - Progress (Week 2): Progressing toward goal PT Short Term Goal 5 (Week 2): Pt will be able to ambulate 20-25 feet using the LRAD with mod A  PT Short Term Goal 5 - Progress (Week 2): Progressing toward goal Week 3:  PT Short Term Goal 1 (Week 3): STGs=LTGs due to anticipated d/c to SNF  Skilled Therapeutic Interventions/Progress Updates:   Pt supine in bed, amenable to treatment. Pt demonstrates some improvement from previous sessions increased upright tolerance. Pt still limited by pain and limited pain science education. Pt educated on role of OOB and progressing mobility in session with some understanding noted but likely needs much reinforcement. Pt would continue to benefit from skilled PT services to increase functional mobility.  Therapy Documentation Precautions:  Precautions Precautions: Back;Fall Precaution Comments: Fistula L arm Required Braces or Orthoses: Spinal Brace Restrictions Weight Bearing Restrictions: No Pain: Pain Assessment Pain Assessment: 10/10 Pain Score: Asleep Pain Type: Acute pain Pain Location: Back Pain Orientation: Lower Pain Descriptors / Indicators: Aching Pain  Onset: On-going Pain Intervention(s): Manual techniques, pain science education, graded activity Mobility:  Min A for bed mobility with cues for weight shift, technique, and neutral spine Balance:  Min A sitting balance with cues for weight shift and neutral spine, with body leaning to L Other Treatments:  Pt found supine in bed and amenable to therapy. Pt educated at length on rehab plan, importance of OOB activities, pain science, role of progressive mobility, anticipated disposition, and pt right to self determination. L/S and thoracic spine myofascial release, quadratus lumborum release, erector spinae release performed. Facilitation into increased upright performed in sitting. Pt assisted into neutral spine positioning in bed. Pt performs hip ER isometrics 2x10 and hip flexion / glute sets 2x10 gravity eliminated  See FIM for current functional status  Therapy/Group: Individual Therapy  Monia Pouch 08/04/2013, 3:04 PM

## 2013-08-04 NOTE — Progress Notes (Signed)
Holly Ellison is a 73 y.o. female 1940/10/19 [id number]  Subjective:  She is quiet this morning...  Objective: Vital signs in last 24 hours: Temp:  [98.2 F (36.8 C)-99.4 F (37.4 C)] 98.2 F (36.8 C) (06/28 0551) Pulse Rate:  [79-102] 84 (06/28 0551) Resp:  [16-18] 18 (06/28 0551) BP: (93-197)/(45-87) 106/68 mmHg (06/28 0551) SpO2:  [95 %-96 %] 95 % (06/28 0551) Weight:  [149 lb 4 oz (67.7 kg)-154 lb 8.7 oz (70.1 kg)] 149 lb 11.1 oz (67.9 kg) (06/28 0647) Weight change:  Last BM Date: 08/03/13  Intake/Output from previous day: 06/27 0701 - 06/28 0700 In: -  Out: 2261  Last cbgs: CBG (last 3)   Recent Labs  08/03/13 1153 08/03/13 2058 08/04/13 0723  GLUCAP 191* 94 107*     Physical Exam General: No apparent distress   HEENT: not dry Lungs: Normal effort. Lungs clear to auscultation, no crackles or wheezes. Cardiovascular: Regular rate and rhythm, no edema Abdomen: S/NT/ND; BS(+) Musculoskeletal:  unchanged Neurological: No new neurological deficits Wounds: N/A    Skin: clear  Aging changes Mental state: Alert, not agitated this am    Lab Results: BMET    Component Value Date/Time   NA 135* 08/03/2013 1638   K 3.7 08/03/2013 1638   CL 93* 08/03/2013 1638   CO2 25 08/03/2013 1638   GLUCOSE 126* 08/03/2013 1638   BUN 26* 08/03/2013 1638   CREATININE 4.05* 08/03/2013 1638   CALCIUM 9.1 08/03/2013 1638   GFRNONAA 10* 08/03/2013 1638   GFRAA 12* 08/03/2013 1638   CBC    Component Value Date/Time   WBC 8.8 08/03/2013 1639   RBC 2.44* 08/03/2013 1639   RBC 2.66* 06/05/2011 0156   HGB 8.0* 08/03/2013 1639   HCT 24.6* 08/03/2013 1639   PLT 347 08/03/2013 1639   MCV 100.8* 08/03/2013 1639   MCH 32.8 08/03/2013 1639   MCHC 32.5 08/03/2013 1639   RDW 16.6* 08/03/2013 1639   LYMPHSABS 1.5 07/17/2013 1110   MONOABS 0.3 07/17/2013 1110   EOSABS 0.0 07/17/2013 1110   BASOSABS 0.0 07/17/2013 1110    Studies/Results: No results found.  Medications: I have reviewed the  patient's current medications.  Assessment/Plan:    1. 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  2. DVT Prophylaxis/Anticoagulation: Mechanical: Antiembolism stockings, thigh (TED hose) Bilateral lower extremities  Sequential compression devices, below knee Bilateral lower extremities  3. Pain Management: lidoderm patches for back.  -utilize, tylenol, ice, heat--  -resumed LOW dose scheduled hydrocodone at 7am and 12 noon each day to coincide with therapy  4. H/o depression/Mood: Resume lexapro. LCSW to follow for evaluation and support.  5. Neuropsych: This patient is not currently capable of making decisions on her own behalf.  6. ESRD: Continue HD on TTS after therapy sessions.  7. DM type 2 with peripheral neuropathy: Continue lantus insulin with SSI for elevated BS. Inconsistent PO -hypoglycemic yesterday  -decrease lantus to 10units  8. CAD: Continue plavix, lipitor, lopressor and ranexa.  9. Leucocytosis: Monitor for signs of infection. WBC 11.3  10. HTN: norvasc 5mg  daily---increase to 10mg   - Prn po clonidine in place.  11. Acute on chronic anemia: On aranesp. Transfusion with dialysis prn.  12. AMS: baseline dementia. Periodic confusion and agitation usually associated with HD, pain, sometimes hypoglycemia  -will need to be conservative with pain measures. -will watch. Consider Seroquel if more delusional (she is not agitated this morning) 13. Abd pain  complaint: normal exam this am as well. Will w/up if cont to complain   Length of stay, days: [place] , MD 08/04/2013, 8:22 AM

## 2013-08-05 ENCOUNTER — Inpatient Hospital Stay (HOSPITAL_COMMUNITY): Payer: Medicare Other

## 2013-08-05 ENCOUNTER — Encounter (HOSPITAL_COMMUNITY): Payer: Medicare Other

## 2013-08-05 DIAGNOSIS — G8220 Paraplegia, unspecified: Secondary | ICD-10-CM

## 2013-08-05 LAB — GLUCOSE, CAPILLARY
GLUCOSE-CAPILLARY: 125 mg/dL — AB (ref 70–99)
GLUCOSE-CAPILLARY: 130 mg/dL — AB (ref 70–99)
Glucose-Capillary: 213 mg/dL — ABNORMAL HIGH (ref 70–99)
Glucose-Capillary: 145 mg/dL — ABNORMAL HIGH (ref 70–99)

## 2013-08-05 MED ORDER — TOPIRAMATE 25 MG PO TABS
25.0000 mg | ORAL_TABLET | Freq: Every day | ORAL | Status: DC
  Administered 2013-08-05 – 2013-08-15 (×11): 25 mg via ORAL
  Filled 2013-08-05 (×12): qty 1

## 2013-08-05 NOTE — Progress Notes (Signed)
Subjective:  Very weak, denies any pain currently, breathing well  Objective: Vital signs in last 24 hours: Temp:  [97.9 F (36.6 C)-99.3 F (37.4 C)] 98.5 F (36.9 C) (06/29 0524) Pulse Rate:  [76-87] 87 (06/29 0524) Resp:  [17-18] 18 (06/29 0524) BP: (118-170)/(52-73) 136/52 mmHg (06/29 0938) SpO2:  [96 %-98 %] 98 % (06/29 0524) Weight:  [69.1 kg (152 lb 5.4 oz)] 69.1 kg (152 lb 5.4 oz) (06/29 0524) Weight change: -1 kg (-2 lb 3.3 oz)  Intake/Output from previous day:   Intake/Output this shift: Total I/O In: 120 [P.O.:120] Out: -   Lab Results:  Recent Labs  08/03/13 1639  WBC 8.8  HGB 8.0*  HCT 24.6*  PLT 347   BMET:  Recent Labs  08/03/13 1638  NA 135*  K 3.7  CL 93*  CO2 25  GLUCOSE 126*  BUN 26*  CREATININE 4.05*  CALCIUM 9.1  ALBUMIN 2.9*   No results found for this basename: PTH,  in the last 72 hours Iron Studies: No results found for this basename: IRON, TIBC, TRANSFERRIN, FERRITIN,  in the last 72 hours  Studies/Results: No results found.  EXAM:  General appearance: Alert, in no apparent distress Resp: CTA without rales, rhonchi, or wheezes  Cardio: RRR without murmur or rub  GI: + BS, soft, nontender Extremities: No edema  Access: AVF @ LUA with + bruit   HD: [facility]  4h 2/2.25 Bath 73kg LUA AVF Heparin 4500  Aranesp 25 mcg q 4 weeks, last 6/9 Hectorol 4 ug TIW  Tsat 26%, ferr 1119, pth 286  Assessment/Plan: 1. Pain - on Lidoderm patches & low-dose Hydrocodone for low back pain.  2. Recent AMS - resolved, believed to be sec to meds; recommended only Tylenol for pain, but pain poorly controlled.  3. 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  4. Depression - Lexapro started this admission was dc'd, believed to contribute to AMS. 5. ESRD - HD on TTS @ [facility], K 3.7.  Next HD tomorrow.  6. HTN/Volume -  BP 136/52 on Amlodipine 5 mg qd, Metoprolol 50 mg bid; wt 69.1 kg, below EDW.  7. Anemia - Hgb 8, s/p 1 U PRBCs 6/20, Aranesp 100 mcg on Thurs. Check CBC pre-HD tomorrow.  8. 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  9. Nutrition - Alb 2.9, renal carb-mod diet, vitamin.  10. CAD - 7 stents, CABG, on Ranexa.  11. DM - insulin per primary.     LOS: 17 days   LYLES,CHARLES 08/05/2013,10:01 AM  Pt seen, examined and agree w A/P as above.  [name] MD pager [phone]    cell [phone] 08/05/2013, 2:11 PM

## 2013-08-05 NOTE — Progress Notes (Signed)
Physical Therapy Session Note  Patient Details  Name: Holly Ellison MRN: [id number] Date of Birth: Sep 28, 1940  Today's Date: 08/05/2013 Time: Treatment Session 1: 4315-4008; Treatment Session 2: 1300-1310 Time Calculation (min): Treatment Session 1: 55 min; Treatment Session 2: 59min  Short Term Goals: Week 3:  PT Short Term Goal 1 (Week 3): STGs=LTGs due to anticipated d/c to SNF  Skilled Therapeutic Interventions/Progress Updates:  Treatment Session 1:  1:1. Pt received semi-reclined in bed w/ complaints of having a "migraine." RN made aware, but that pt already received pain medicine. Pt req consistent mod-max encouragement to participate throughout session and required 48min to initiate and complete sitting EOB w/ min A. Pt req close(S) for sitting EOB, donning new gown and back brace. Increased complaints of pain with prolonged sitting EOB and pt became very emotion upon request for pt to t/f to w/c. Pt adamant about lying back down, req min A. Pt very tearful expressing doubt of ability to participate bc of weakness and then stating, "where do I even go from here, I don't have anywhere to go!" Education and emotional support provided, again emphasizing role of therapies and reviewed d/c options. Pt initially declining further participation in therapy, but agreeable to supine therex for B LE strengthening w/ use of light orange theraband. Pt found to be incontinent of urine total A for clean up, but pt req (S)-min A for B rolling multiple times during clean up. PT left w/ all needs in reach and bed alarm on.   Treatment Session 2:  1:1. Pt received semi-reclined in bed with son in room. Pt adamantly declining participation in therapy stating, "I just want you people to leave me alone!" Therapist providing max encouragement and education regarding role of therapies in healing process. Pt became very tearful stating to son, "just make her leave me alone!" Son responded by stating, "I can't do  that" then promptly got up and left room. Pt contradicted self stating, "No one is helping me get better, no one is helping me take care of myself." Therapist again emphasizing role of therapies, pt again requesting to be left alone. Pt left w/ all needs in reach, bed alarm on. RN, POA-C and SW aware of concerns.  Pt missed 53min this session due to refusal.   Therapy Documentation Precautions:  Precautions Precautions: Back;Fall Precaution Comments: Fistula L arm Required Braces or Orthoses: Spinal Brace Restrictions Weight Bearing Restrictions: No General: Amount of Missed PT Time (min): 50 Minutes Missed Time Reason: Patient unwilling/refused to participate without medical reason  See FIM for current functional status  Therapy/Group: Individual Therapy  Gilmore Laroche 08/05/2013, 1:42 PM

## 2013-08-05 NOTE — Progress Notes (Signed)
Occupational Therapy Session Note  Patient Details  Name: Keyry Iracheta MRN: [id number] Date of Birth: 1940/02/08  Today's Date: 08/05/2013 Time: 0900-0955 Time Calculation (min): 55 min  Short Term Goals: Week 2:  OT Short Term Goal 1 (Week 2): Pt will consistently transfer to [facility] with mod A OT Short Term Goal 2 (Week 2): Pt will stand with mod A to complete LB bathing tasks OT Short Term Goal 3 (Week 2): Pt will perform LB bathing with mod A using AE prn  Skilled Therapeutic Interventions/Progress Updates:    Pt agreeable to bathing and dressing tasks at bed level but refused to sit EOB to complete tasks.  Focus on activity tolerance, bed mobility, active participation.  Pt required max verbal cues to initiate tasks and often would stop and ask "what's next." Encouraged patient to eat breakfast and patient stated that she couldn't.   Therapy Documentation Precautions:  Precautions Precautions: Back;Fall Precaution Comments: Fistula L arm Required Braces or Orthoses: Spinal Brace Restrictions Weight Bearing Restrictions: No   Pain: Pain Assessment Pain Assessment: Faces Faces Pain Scale: Hurts even more Pain Type: Acute pain Pain Location: Back Pain Orientation: Mid Pain Descriptors / Indicators: Aching Pain Onset: With Activity  See FIM for current functional status  Therapy/Group: Individual Therapy  Session 2 Time: 1771-1657 Pt missed 15 mins ("I don't know what to do, I don't want to do anything") Pt c/o 8/10 pain with transitional movements in bed; RN aware;repositioned Individual Therapy  Pt resting in bed upon arrival.  Pt laying in awkward position in bed and when questioned if she was comfortable she stated no.  When asked if she wanted some assistance in repositioning patient stated no but finally agreed to roll in bed and assist with repositioning in bed.  Pt agreed that this new position was more comfortable.  Attempted to engage patient in sitting EOB with  focus on increased activity tolerance, transitional movements, and sitting balance.  Pt stated that she knew she needed to do these things but she couldn't.  Pt expressed increased pain with attempt to sit EOB and refused to continue with session.  RN notified  Leroy Libman 08/05/2013, 10:03 AM

## 2013-08-05 NOTE — Progress Notes (Signed)
Patient ID: Mauri Temkin, female   DOB: May 21, 1940, 73 y.o.   MRN: [id number] 73 y.o. female with history of DM type 2 with neuropathy, CAD, COPD, ESRD, HA, who was admitted on 07/16/13 pm with one week history of BLE, fecal incontinence and severe back pain. 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 Vertell Limber. Frozen section appeared benign and NS questions old or indolent osteomyelitis. Patient with history of MSSA bacteremia 07/2012 s/p 4 weeks of antibiotic therapy. CT chest without evidence of malignancy. HD ongoing and labs with evidence of ABLA with down to 7.1 as well as leucocytosis with WBC-16.0.    Subjective/Complaints: No new issues overnite Has chronic migraines and has one today, unrelieved by tylenol   Review of Systems   All other systems reviewed and are negative.  Objective: Vital Signs: Blood pressure 170/69, pulse 87, temperature 98.5 F (36.9 C), temperature source Oral, resp. rate 18, height 5' 6.14" (1.68 m), weight 69.1 kg (152 lb 5.4 oz), SpO2 98.00%. No results found. Results for orders placed during the hospital encounter of 07/19/13 (from the past 72 hour(s))  GLUCOSE, CAPILLARY     Status: None   Collection Time    08/02/13  7:39 AM      Result Value Ref Range   Glucose-Capillary 99  70 - 99 mg/dL   Comment 1 Notify RN    GLUCOSE, CAPILLARY     Status: Abnormal   Collection Time    08/02/13 12:17 PM      Result Value Ref Range   Glucose-Capillary 151 (*) 70 - 99 mg/dL   Comment 1 Notify RN    GLUCOSE, CAPILLARY     Status: Abnormal   Collection Time    08/02/13  4:19 PM      Result Value Ref Range   Glucose-Capillary 114 (*) 70 - 99 mg/dL   Comment 1 Notify RN    GLUCOSE, CAPILLARY     Status: Abnormal   Collection Time    08/02/13  8:55 PM      Result Value Ref Range   Glucose-Capillary 171  (*) 70 - 99 mg/dL  GLUCOSE, CAPILLARY     Status: Abnormal   Collection Time    08/03/13  7:32 AM      Result Value Ref Range   Glucose-Capillary 109 (*) 70 - 99 mg/dL  GLUCOSE, CAPILLARY     Status: Abnormal   Collection Time    08/03/13 11:53 AM      Result Value Ref Range   Glucose-Capillary 191 (*) 70 - 99 mg/dL  RENAL FUNCTION PANEL     Status: Abnormal   Collection Time    08/03/13  4:38 PM      Result Value Ref Range   Sodium 135 (*) 137 - 147 mEq/L   Potassium 3.7  3.7 - 5.3 mEq/L   Chloride 93 (*) 96 - 112 mEq/L   CO2 25  19 - 32 mEq/L   Glucose, Bld 126 (*) 70 - 99 mg/dL   BUN 26 (*) 6 - 23 mg/dL   Creatinine, Ser 4.05 (*) 0.50 - 1.10 mg/dL   Calcium 9.1  8.4 - 10.5 mg/dL   Phosphorus 4.2  2.3 - 4.6 mg/dL   Albumin 2.9 (*) 3.5 - 5.2 g/dL   GFR calc non Af Amer 10 (*) >90 mL/min   GFR calc Af Wyvonnia Lora  12 (*) >90 mL/min   Comment: (NOTE)     The eGFR has been calculated using the CKD EPI equation.     This calculation has not been validated in all clinical situations.     eGFR's persistently <90 mL/min signify possible Chronic Kidney     Disease.  CBC     Status: Abnormal   Collection Time    08/03/13  4:39 PM      Result Value Ref Range   WBC 8.8  4.0 - 10.5 K/uL   RBC 2.44 (*) 3.87 - 5.11 MIL/uL   Hemoglobin 8.0 (*) 12.0 - 15.0 g/dL   HCT 24.6 (*) 36.0 - 46.0 %   MCV 100.8 (*) 78.0 - 100.0 fL   MCH 32.8  26.0 - 34.0 pg   MCHC 32.5  30.0 - 36.0 g/dL   RDW 16.6 (*) 11.5 - 15.5 %   Platelets 347  150 - 400 K/uL  GLUCOSE, CAPILLARY     Status: None   Collection Time    08/03/13  8:58 PM      Result Value Ref Range   Glucose-Capillary 94  70 - 99 mg/dL  GLUCOSE, CAPILLARY     Status: Abnormal   Collection Time    08/04/13  7:23 AM      Result Value Ref Range   Glucose-Capillary 107 (*) 70 - 99 mg/dL  GLUCOSE, CAPILLARY     Status: Abnormal   Collection Time    08/04/13 11:33 AM      Result Value Ref Range   Glucose-Capillary 162 (*) 70 - 99 mg/dL  GLUCOSE,  CAPILLARY     Status: Abnormal   Collection Time    08/04/13  4:29 PM      Result Value Ref Range   Glucose-Capillary 139 (*) 70 - 99 mg/dL  GLUCOSE, CAPILLARY     Status: Abnormal   Collection Time    08/04/13  9:05 PM      Result Value Ref Range   Glucose-Capillary 224 (*) 70 - 99 mg/dL      Nursing note and vitals reviewed.  Constitutional: She is oriented to person, place, and time. She appears well-developed and well-nourished. She is sleeping. She is easily aroused.  HENT:  Head: Normocephalic and atraumatic.  Oropharynx- poor dentition, moist and clear Eyes: Conjunctivae are normal. Pupils are equal, round, and reactive to light.  Neck: Normal range of motion. Neck supple.  Cardiovascular: Normal rate and regular rhythm.  Respiratory: Effort normal and breath sounds normal. No respiratory distress. She has no wheezes.  GI: Soft. Bowel sounds are normal. She exhibits no distension. There is no tenderness.  Musculoskeletal: She exhibits no edema, back remains tender.  Neurological: She is oriented to person, cooperative. Follows commands. Fair insight.  Bilateral hands with thenar atrophy and contracture with decrease in fine motor movements. Numbness stocking-glove distribution persists.  Skin: Skin is warm and dry.  Back incision remains dry/ clean 4 minus bilateral deltoid, bicep, tricep 3 minus bilateral finger flexors and extensors  2 minus bilateral hip flexors 3 minus bilaterally knee extensors 2 minus bilateral ankle dorsiflexors and plantar flexors Psych: sl confusion, calm,cooperative  Assessment/Plan: 1. Functional deficits secondary to T11 lesion with paraplegia which require 3+ hours per day of interdisciplinary therapy in a comprehensive inpatient rehab setting. Physiatrist is providing close team supervision and 24 hour management of active medical problems listed below. Physiatrist and rehab team continue to assess barriers to discharge/monitor patient progress  toward functional  and medical goals.    FIM: FIM - Bathing Bathing Steps Patient Completed: Chest;Right Arm;Left Arm;Abdomen;Front perineal area (bed level) Bathing: 2: Max-Patient completes 3-4 [id number] parts or 25-49%  FIM - Upper Body Dressing/Undressing Upper body dressing/undressing steps patient completed: Thread/unthread right sleeve of pullover shirt/dresss;Thread/unthread left sleeve of pullover shirt/dress (bed level) Upper body dressing/undressing: 0: Wears gown/pajamas-no public clothing FIM - Lower Body Dressing/Undressing Lower body dressing/undressing steps patient completed: Thread/unthread left underwear leg;Thread/unthread left pants leg Lower body dressing/undressing: 0: Wears gown/pajamas-no public clothing  FIM - Toileting Toileting steps completed by patient: Adjust clothing prior to toileting;Performs perineal hygiene;Adjust clothing after toileting Toileting Assistive Devices: Grab bar or rail for support Toileting: 0: Activity did not occur  FIM - TRadio producerDevices: Grab bars Toilet Transfers: 0-Activity did not occur  FIM - BControl and instrumentation engineerDevices: Arm rests;HOB elevated;Bed rails Bed/Chair Transfer: 4: Supine > Sit: Min A (steadying Pt. > 75%/lift 1 leg);3: Sit > Supine: Mod A (lifting assist/Pt. 50-74%/lift 2 legs)  FIM - Locomotion: Wheelchair Distance: 60 Locomotion: Wheelchair: 0: Activity did not occur FIM - Locomotion: Ambulation Locomotion: Ambulation Assistive Devices: Parallel bars Ambulation/Gait Assistance: 4: Min assist Locomotion: Ambulation: 0: Activity did not occur  Comprehension Comprehension Mode: Auditory Comprehension: 5-Understands basic 90% of the time/requires cueing < 10% of the time  Expression Expression Mode: Verbal Expression: 4-Expresses basic 75 - 89% of the time/requires cueing 10 - 24% of the time. Needs helper to occlude trach/needs to repeat  words.  Social Interaction Social Interaction: 3-Interacts appropriately 50 - 74% of the time - May be physically or verbally inappropriate.  Problem Solving Problem Solving: 3-Solves basic 50 - 74% of the time/requires cueing 25 - 49% of the time  Memory Memory: 3-Recognizes or recalls 50 - 74% of the time/requires cueing 25 - 49% of the time  Medical Problem List and Plan:  1. 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  2. DVT Prophylaxis/Anticoagulation: Mechanical: Antiembolism stockings, thigh (TED hose) Bilateral lower extremities  Sequential compression devices, below knee Bilateral lower extremities  3. Pain Management: lidoderm patches for back.   -utilize, tylenol, ice, heat--   -resumed LOW dose scheduled hydrocodone at 7am and 12 noon each day to coincide with therapy History of migraines, no prophyllaxis- not relieved by tylenol, trial topamax 4. H/o depression/Mood: Resume lexapro. LCSW to follow for evaluation and support.  5. Neuropsych: This patient is not currently capable of making decisions on her own behalf.  6. ESRD: Continue HD on TTS after therapy sessions.  7. DM type 2 with peripheral neuropathy:  Continue lantus insulin with SSI for elevated BS. Inconsistent PO -hypoglycemic yesterday  -decrease lantus to 10units 8. CAD: Continue plavix, lipitor, lopressor and ranexa.  9. Leucocytosis: Monitor for signs of infection. WBC 11.3 10. HTN:  norvasc  550mdaily---increase to 1011m  -  Prn po clonidine in place.  11. Acute on chronic anemia: On aranesp. Transfusion with dialysis prn.  12. AMS: baseline dementia. Periodic confusion and agitation usually associated with HD, pain, sometimes hypoglycemia    -will need to be conservative with pain measures.   LOS (Days) 17 A FACE TO FACE EVALUATION WAS PERFORMED  KIRSTEINS,ANDREW E 08/05/2013, 7:05 AM

## 2013-08-06 ENCOUNTER — Inpatient Hospital Stay (HOSPITAL_COMMUNITY): Payer: Medicare Other | Admitting: Occupational Therapy

## 2013-08-06 ENCOUNTER — Inpatient Hospital Stay (HOSPITAL_COMMUNITY): Payer: Medicare Other | Admitting: Physical Therapy

## 2013-08-06 DIAGNOSIS — G8220 Paraplegia, unspecified: Secondary | ICD-10-CM

## 2013-08-06 LAB — RENAL FUNCTION PANEL
Albumin: 2.9 g/dL — ABNORMAL LOW (ref 3.5–5.2)
BUN: 37 mg/dL — ABNORMAL HIGH (ref 6–23)
CO2: 25 meq/L (ref 19–32)
Calcium: 8.8 mg/dL (ref 8.4–10.5)
Chloride: 88 mEq/L — ABNORMAL LOW (ref 96–112)
Creatinine, Ser: 6.07 mg/dL — ABNORMAL HIGH (ref 0.50–1.10)
GFR calc Af Amer: 7 mL/min — ABNORMAL LOW (ref >90)
GFR, EST NON AFRICAN AMERICAN: 6 mL/min — AB (ref >90)
Glucose, Bld: 178 mg/dL — ABNORMAL HIGH (ref 70–99)
Phosphorus: 5.6 mg/dL — ABNORMAL HIGH (ref 2.3–4.6)
Potassium: 3.5 mEq/L — ABNORMAL LOW (ref 3.7–5.3)
SODIUM: 133 meq/L — AB (ref 137–147)

## 2013-08-06 LAB — GLUCOSE, CAPILLARY
GLUCOSE-CAPILLARY: 104 mg/dL — AB (ref 70–99)
GLUCOSE-CAPILLARY: 171 mg/dL — AB (ref 70–99)
Glucose-Capillary: 100 mg/dL — ABNORMAL HIGH (ref 70–99)

## 2013-08-06 LAB — CBC
HCT: 26.5 % — ABNORMAL LOW (ref 36.0–46.0)
Hemoglobin: 8.6 g/dL — ABNORMAL LOW (ref 12.0–15.0)
MCH: 32.1 pg (ref 26.0–34.0)
MCHC: 32.5 g/dL (ref 30.0–36.0)
MCV: 98.9 fL (ref 78.0–100.0)
PLATELETS: 364 10*3/uL (ref 150–400)
RBC: 2.68 MIL/uL — ABNORMAL LOW (ref 3.87–5.11)
RDW: 16.6 % — AB (ref 11.5–15.5)
WBC: 7.6 10*3/uL (ref 4.0–10.5)

## 2013-08-06 MED ORDER — ESCITALOPRAM OXALATE 5 MG PO TABS
5.0000 mg | ORAL_TABLET | Freq: Every day | ORAL | Status: DC
  Administered 2013-08-06: 5 mg via ORAL
  Filled 2013-08-06 (×2): qty 1

## 2013-08-06 MED ORDER — ACETAMINOPHEN 325 MG PO TABS
ORAL_TABLET | ORAL | Status: AC
  Filled 2013-08-06: qty 2

## 2013-08-06 MED ORDER — HYDROCODONE-ACETAMINOPHEN 5-325 MG PO TABS
1.0000 | ORAL_TABLET | Freq: Two times a day (BID) | ORAL | Status: DC
  Administered 2013-08-07 – 2013-08-15 (×18): 1 via ORAL
  Filled 2013-08-06 (×18): qty 1

## 2013-08-06 NOTE — Progress Notes (Signed)
Occupational Therapy Session Notes  Patient Details  Name: Holly Ellison MRN: [id number] Date of Birth: 08-05-1940  Today's Date: 08/06/2013 Time: 1100-1200 and 130-145 Time Calculation (min): 60 min and 15 min (45 min cancelled secondary to pain)  Short Term Goals: Week 2:  OT Short Term Goal 1 (Week 2): Pt will consistently transfer to [facility] with mod A OT Short Term Goal 2 (Week 2): Pt will stand with mod A to complete LB bathing tasks OT Short Term Goal 3 (Week 2): Pt will perform LB bathing with mod A using AE prn  Skilled Therapeutic Interventions/Progress Updates:  1)  Patient resting in bed upon arrival.  Patient reporting that she would love to get in the shower and let the water run on her back.  Engaged in preparing for shower to include bed mobility, don back brace and transfer to w/c.  Patient required max encouragement and max cues for sequencing and reminders regarding the task(s) we agreed to work on in session.  Just before transfering w/c to shower bench, patient tilted her head back, eyes got big then closed as she cried out, "I'm dizzy, I got to go back to bed."  RN called in and BP taken> WNL.  Patient also reporting significant back pain and frantically wanting to get back to bed.  Assisted patient back to bed.  Patient unaware that she was incontinent of urine in brief therefore, performed bed bath and UB dressing in bed. Patient refused to don pants this morning.  2)   Patient resting in bed upon arrival with son sitting at her side.  Attempted to engage in self care retraining and out of bed activity.  Patient reluctantly sat edge of bed to use reacher to donn pants then back to bed to pull them up with rolling.  This task required max-total assist and patient declined to continue secondary to pain.  Patient's son left the room in the beginning of the session when he understood that we were going to work on putting on pants.  "she acts all crazy when I'm in the room".  Therapy  Documentation Precautions:  Precautions Precautions: Back;Fall Precaution Comments: Fistula L arm Required Braces or Orthoses: Spinal Brace Restrictions Weight Bearing Restrictions: No  Pain: 1)  Before activity: no report of pain, with activity: crying out in pain, not rated, rest, repositioned, RN aware and patient was premedicated. 2)  Before activity: no report of pain, with activity: crying out in pain, not rated, rest, repositioned, RN aware and patient was premedicated. ADL: See FIM for current functional status  Therapy/Group: Individual Therapy both sessions  [place], Bells 08/06/2013, 2:18 PM

## 2013-08-06 NOTE — Progress Notes (Signed)
Patient ID: Holly Ellison, female   DOB: 1941/01/02, 73 y.o.   MRN: [id number] 73 y.o. female with history of DM type 2 with neuropathy, CAD, COPD, ESRD, HA, who was admitted on 07/16/13 pm with one week history of BLE, fecal incontinence and severe back pain. 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 Vertell Limber. Frozen section appeared benign and NS questions old or indolent osteomyelitis. Patient with history of MSSA bacteremia 07/2012 s/p 4 weeks of antibiotic therapy. CT chest without evidence of malignancy. HD ongoing and labs with evidence of ABLA with down to 7.1 as well as leucocytosis with WBC-16.0.    Subjective/Complaints: No new issues overnite Sleeping but arouses to voice   Review of Systems   All other systems reviewed and are negative.  Objective: Vital Signs: Blood pressure 173/68, pulse 82, temperature 89.7 F (32.1 C), temperature source Oral, resp. rate 19, height 5' 6.14" (1.68 m), weight 69.1 kg (152 lb 5.4 oz), SpO2 99.00%. No results found. Results for orders placed during the hospital encounter of 07/19/13 (from the past 72 hour(s))  GLUCOSE, CAPILLARY     Status: Abnormal   Collection Time    08/03/13  7:32 AM      Result Value Ref Range   Glucose-Capillary 109 (*) 70 - 99 mg/dL  GLUCOSE, CAPILLARY     Status: Abnormal   Collection Time    08/03/13 11:53 AM      Result Value Ref Range   Glucose-Capillary 191 (*) 70 - 99 mg/dL  RENAL FUNCTION PANEL     Status: Abnormal   Collection Time    08/03/13  4:38 PM      Result Value Ref Range   Sodium 135 (*) 137 - 147 mEq/L   Potassium 3.7  3.7 - 5.3 mEq/L   Chloride 93 (*) 96 - 112 mEq/L   CO2 25  19 - 32 mEq/L   Glucose, Bld 126 (*) 70 - 99 mg/dL   BUN 26 (*) 6 - 23 mg/dL   Creatinine, Ser 4.05 (*) 0.50 - 1.10 mg/dL   Calcium 9.1  8.4 - 10.5 mg/dL    Phosphorus 4.2  2.3 - 4.6 mg/dL   Albumin 2.9 (*) 3.5 - 5.2 g/dL   GFR calc non Af Amer 10 (*) >90 mL/min   GFR calc Af Amer 12 (*) >90 mL/min   Comment: (NOTE)     The eGFR has been calculated using the CKD EPI equation.     This calculation has not been validated in all clinical situations.     eGFR's persistently <90 mL/min signify possible Chronic Kidney     Disease.  CBC     Status: Abnormal   Collection Time    08/03/13  4:39 PM      Result Value Ref Range   WBC 8.8  4.0 - 10.5 K/uL   RBC 2.44 (*) 3.87 - 5.11 MIL/uL   Hemoglobin 8.0 (*) 12.0 - 15.0 g/dL   HCT 24.6 (*) 36.0 - 46.0 %   MCV 100.8 (*) 78.0 - 100.0 fL   MCH 32.8  26.0 - 34.0 pg   MCHC 32.5  30.0 - 36.0 g/dL   RDW 16.6 (*) 11.5 - 15.5 %   Platelets 347  150 - 400 K/uL  GLUCOSE, CAPILLARY     Status: None   Collection Time    08/03/13  8:58 PM      Result Value Ref Range   Glucose-Capillary 94  70 - 99 mg/dL  GLUCOSE, CAPILLARY     Status: Abnormal   Collection Time    08/04/13  7:23 AM      Result Value Ref Range   Glucose-Capillary 107 (*) 70 - 99 mg/dL  GLUCOSE, CAPILLARY     Status: Abnormal   Collection Time    08/04/13 11:33 AM      Result Value Ref Range   Glucose-Capillary 162 (*) 70 - 99 mg/dL  GLUCOSE, CAPILLARY     Status: Abnormal   Collection Time    08/04/13  4:29 PM      Result Value Ref Range   Glucose-Capillary 139 (*) 70 - 99 mg/dL  GLUCOSE, CAPILLARY     Status: Abnormal   Collection Time    08/04/13  9:05 PM      Result Value Ref Range   Glucose-Capillary 224 (*) 70 - 99 mg/dL  GLUCOSE, CAPILLARY     Status: Abnormal   Collection Time    08/05/13  7:18 AM      Result Value Ref Range   Glucose-Capillary 125 (*) 70 - 99 mg/dL   Comment 1 Notify RN    GLUCOSE, CAPILLARY     Status: Abnormal   Collection Time    08/05/13 11:17 AM      Result Value Ref Range   Glucose-Capillary 213 (*) 70 - 99 mg/dL   Comment 1 Notify RN    GLUCOSE, CAPILLARY     Status: Abnormal   Collection  Time    08/05/13  4:18 PM      Result Value Ref Range   Glucose-Capillary 130 (*) 70 - 99 mg/dL  GLUCOSE, CAPILLARY     Status: Abnormal   Collection Time    08/05/13  8:43 PM      Result Value Ref Range   Glucose-Capillary 145 (*) 70 - 99 mg/dL      Nursing note and vitals reviewed.  Constitutional: She is oriented to person, place, and time. She appears well-developed and well-nourished. She is sleeping. She is easily aroused.  HENT:  Head: Normocephalic and atraumatic.  Oropharynx- poor dentition, moist and clear Eyes: Conjunctivae are normal. Pupils are equal, round, and reactive to light.  Neck: Normal range of motion. Neck supple.  Cardiovascular: Normal rate and regular rhythm.  Respiratory: Effort normal and breath sounds normal. No respiratory distress. She has no wheezes.  GI: Soft. Bowel sounds are normal. She exhibits no distension. There is no tenderness.  Musculoskeletal: She exhibits no edema, back remains tender.  Neurological: She is oriented to person, cooperative. Follows commands. Fair insight.  Bilateral hands with thenar atrophy and contracture with decrease in fine motor movements. Numbness stocking-glove distribution persists.  Skin: Skin is warm and dry.  Back incision remains dry/ clean 4 minus bilateral deltoid, bicep, tricep 3 minus bilateral finger flexors and extensors  2 minus bilateral hip flexors 3 minus bilaterally knee extensors 2 minus bilateral ankle dorsiflexors and plantar flexors Psych: sl confusion, calm,cooperative  Assessment/Plan: 1. Functional deficits secondary to T11 lesion with paraplegia which require 3+ hours per day of interdisciplinary therapy in a comprehensive inpatient rehab setting. Physiatrist is providing close team supervision and 24 hour management of active medical problems listed below. Physiatrist and rehab team continue to assess barriers to discharge/monitor patient progress toward functional and medical  goals.    FIM: FIM - Bathing Bathing Steps Patient  Completed: Chest;Right Arm;Left Arm;Abdomen Bathing: 2: Max-Patient completes 3-4 [id number] parts or 25-49%  FIM - Upper Body Dressing/Undressing Upper body dressing/undressing steps patient completed: Thread/unthread right sleeve of pullover shirt/dresss;Thread/unthread left sleeve of pullover shirt/dress;Put head through opening of pull over shirt/dress Upper body dressing/undressing: 4: Min-Patient completed 75 plus % of tasks FIM - Lower Body Dressing/Undressing Lower body dressing/undressing steps patient completed: Thread/unthread left underwear leg;Thread/unthread left pants leg Lower body dressing/undressing: 0: Wears gown/pajamas-no public clothing  FIM - Toileting Toileting steps completed by patient: Adjust clothing prior to toileting;Performs perineal hygiene;Adjust clothing after toileting Toileting Assistive Devices: Grab bar or rail for support Toileting: 0: Activity did not occur  FIM - TRadio producerDevices: Grab bars Toilet Transfers: 0-Activity did not occur  FIM - BControl and instrumentation engineerDevices: HOB elevated;Bed rails Bed/Chair Transfer: 4: Sit > Supine: Min A (steadying pt. > 75%/lift 1 leg);4: Supine > Sit: Min A (steadying Pt. > 75%/lift 1 leg)  FIM - Locomotion: Wheelchair Distance: 60 Locomotion: Wheelchair: 0: Activity did not occur FIM - Locomotion: Ambulation Locomotion: Ambulation Assistive Devices: Parallel bars Ambulation/Gait Assistance: 4: Min assist Locomotion: Ambulation: 0: Activity did not occur  Comprehension Comprehension Mode: Auditory Comprehension: 5-Follows basic conversation/direction: With no assist  Expression Expression Mode: Verbal Expression: 4-Expresses basic 75 - 89% of the time/requires cueing 10 - 24% of the time. Needs helper to occlude trach/needs to repeat words.  Social Interaction Social Interaction: 3-Interacts  appropriately 50 - 74% of the time - May be physically or verbally inappropriate.  Problem Solving Problem Solving: 3-Solves basic 50 - 74% of the time/requires cueing 25 - 49% of the time  Memory Memory: 3-Recognizes or recalls 50 - 74% of the time/requires cueing 25 - 49% of the time  Medical Problem List and Plan:  1. 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  2. DVT Prophylaxis/Anticoagulation: Mechanical: Antiembolism stockings, thigh (TED hose) Bilateral lower extremities  Sequential compression devices, below knee Bilateral lower extremities  3. Pain Management: lidoderm patches for back.   -utilize, tylenol, ice, heat--   -resumed LOW dose scheduled hydrocodone at 7am and 12 noon each day to coincide with therapy History of migraines, no prophyllaxis- not relieved by tylenol, trial topamax qhs , monitor sedation 4. H/o depression/Mood: Resume lexapro. LCSW to follow for evaluation and support.  5. Neuropsych: This patient is not currently capable of making decisions on her own behalf.  6. ESRD: Continue HD on TTS after therapy sessions.  7. DM type 2 with peripheral neuropathy:  Continue lantus insulin with SSI for elevated BS. Inconsistent PO -hypoglycemic yesterday  -decrease lantus to 10units 8. CAD: Continue plavix, lipitor, lopressor and ranexa.  9. Leucocytosis: Monitor for signs of infection. WBC 11.3 10. HTN:  norvasc  568mdaily---increase to 1045m  -  Prn po clonidine in place.  11. Acute on chronic anemia: On aranesp. Transfusion with dialysis prn.  12. AMS: baseline dementia. Periodic confusion and agitation usually associated with HD, pain, sometimes hypoglycemia    -will need to be conservative with pain measures.   LOS (Days) 18 A FACE TO FACE EVALUATION WAS PERFORMED  Breyon Blass E 08/06/2013, 6:19 AM

## 2013-08-06 NOTE — Progress Notes (Signed)
Physical Therapy Session Note  Patient Details  Name: Holly Ellison MRN: [id number] Date of Birth: [birth year]  Today's Date: 08/06/2013 Time: 5003-7048 Time Calculation (min): 15 min  Short Term Goals: Week 3:  PT Short Term Goal 1 (Week 3): STGs=LTGs due to anticipated d/c to SNF  Skilled Therapeutic Interventions/Progress Updates:    Initial 7 minutes of session missed secondary to pt insistent on eating breakfast prior to beginning PT session. Subsequent 8 minutes missed due to nursing care. Pt initially required mod coaxing to agree to participate. Session focused on bed mobility with adherence to spinal precautions. Pt performed graded supine<>sit tranfers (initially with HOB elevated 30 degrees then progressively lowered to flat) using rail requiring min A to lift trunk, multimodal cueing for bilat LE management. Upon sitting EOB, donned brace with Total A. Session focused on bed mobility secondary to inability to coax pt to participate in other activities. Upon sitting EOB, pt would reposition self into R side lying and state "I can't do this." Frequent cues required for adherence to spinal precautions with ineffective carryover within this session. Therapist departed with pt supine in bed with 3 bed rails up, bed alarm on, and pt in no apparent distress.  Therapy Documentation Precautions:  Precautions Precautions: Back;Fall Precaution Comments: Fistula L arm Required Braces or Orthoses: Spinal Brace Restrictions Weight Bearing Restrictions: No General: Amount of Missed PT Time (min): 15 Minutes Missed Time Reason: Nursing care;Other (comment) (Pt eating breakfast for initial 7 minutes of session; nsg care x8 minutes) Pain: Pain Assessment Pain Assessment: 0-10 Pain Score: 8  Pain Type: Surgical pain Pain Location: Back Pain Descriptors / Indicators: Aching Pain Onset: On-going Pain Intervention(s): Medication (See eMAR)  See FIM for current functional  status  Therapy/Group: Individual Therapy  Corey Laski, Malva Cogan 08/06/2013, 12:57 PM

## 2013-08-06 NOTE — Progress Notes (Signed)
Subjective:  Stable, alert, no complaints  Objective: Vital signs in last 24 hours: Temp:  [98.5 F (36.9 C)-99.4 F (37.4 C)] 98.7 F (37.1 C) (06/30 0537) Pulse Rate:  [77-82] 82 (06/30 0537) Resp:  [18-19] 19 (06/30 0537) BP: (142-186)/(64-72) 173/68 mmHg (06/30 0537) SpO2:  [98 %-100 %] 99 % (06/30 0537) Weight:  [69 kg (152 lb 1.9 oz)] 69 kg (152 lb 1.9 oz) (06/30 0537) Weight change: -0.1 kg (-3.5 oz)  Intake/Output from previous day: 06/29 0701 - 06/30 0700 In: 480 [P.O.:480] Out: -  Intake/Output this shift: Total I/O In: 120 [P.O.:120] Out: -   Lab Results:  Recent Labs  08/03/13 1639  WBC 8.8  HGB 8.0*  HCT 24.6*  PLT 347   BMET:   Recent Labs  08/03/13 1638  NA 135*  K 3.7  CL 93*  CO2 25  GLUCOSE 126*  BUN 26*  CREATININE 4.05*  CALCIUM 9.1  ALBUMIN 2.9*   No results found for this basename: PTH,  in the last 72 hours Iron Studies: No results found for this basename: IRON, TIBC, TRANSFERRIN, FERRITIN,  in the last 72 hours  Studies/Results: No results found.  EXAM:  Gen: Alert, in no apparent distress Resp: CTA without rales, rhonchi, or wheezes  Cardio: RRR without murmur or rub  GI: + BS, soft, nontender Ext: No edema  Access: AVF @ LUA with + bruit   HD: [facility]  4h  2/2.25 Bath   73kg   LUA AVF    Heparin 4500  Aranesp 25 mcg q 4 weeks, last 6/9 Hectorol 4 ug TIW  Tsat 26%, ferr 1119, pth 286  Assessment/Plan:  1. Paraparesis, fecal incontinence, back pain / spinal compression from T 10-11 lesion - s/p laminectomy 6/12 Dr Vertell Limber, no evidence of malignancy 2. Debility- on rehab, not progressing and only does about 1 of 3 hrs of therapy per day, refuses to participate about 2/3 of the time. Have d/w primary team, there is question of what her baseline functional status was premorbid to this illness, i.e, is she going to get much better 3. Depression - Lexapro started this admission was dc'd, believed to contribute to depressed AMS.   4. ESRD on HD 5. HTN/Volume - new lower dry wt, on norvasc and BB 6. Anemia cont aranesp 100 q week 7. 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  8. Nutrition - Alb 2.9, renal carb-mod diet, vitamin.  9. CAD - 7 stents, CABG, on Ranexa.  10. DM - insulin per primary  Plan-- HD today  [name] MD (pgr) [phone]    [phone] 08/06/2013, 1:29 PM

## 2013-08-06 NOTE — Progress Notes (Signed)
Reviewed and in agreement with treatment provided.  

## 2013-08-06 NOTE — Progress Notes (Signed)
Physical Therapy Note  Patient Details  Name: Holly Ellison MRN: [id number] Date of Birth: 07/10/1940 Today's Date: 08/06/2013  Time: 1015 - 1045 30 minutes  1:1, c/o 8/10 pain in back and side, RN aware, rest and repositioning as appropriate.  Pt received supine in bed, HOB elevated, agreeable to initial participation stating " I don't want to but knows it's good for me," required PT encouragement throughout session.  Multiple B rolling and sidelying <> sit EOB with CGA/min A, cueing to maintain back precautions.  Multiple lateral leans with CGA/min A to return to sitting.  5 x sit <> stand from EOB using w/c with min/mod A, pt tolerates approx 10 sec standing before seated rest break due to pain and "woozy" feeling.  Pt required total A for bed mobility at end of session due to pain.  Left sidelying in bed with all needs met.  Missed 15 minutes of PT due to pain.  Kibiloski, Sarah 08/06/2013, 10:45 AM   

## 2013-08-07 ENCOUNTER — Inpatient Hospital Stay (HOSPITAL_COMMUNITY): Payer: Medicare Other | Admitting: Physical Therapy

## 2013-08-07 ENCOUNTER — Encounter (HOSPITAL_COMMUNITY): Payer: Medicare Other

## 2013-08-07 ENCOUNTER — Inpatient Hospital Stay (HOSPITAL_COMMUNITY): Payer: Medicare Other

## 2013-08-07 LAB — GLUCOSE, CAPILLARY
Glucose-Capillary: 111 mg/dL — ABNORMAL HIGH (ref 70–99)
Glucose-Capillary: 119 mg/dL — ABNORMAL HIGH (ref 70–99)
Glucose-Capillary: 125 mg/dL — ABNORMAL HIGH (ref 70–99)
Glucose-Capillary: 167 mg/dL — ABNORMAL HIGH (ref 70–99)
Glucose-Capillary: 198 mg/dL — ABNORMAL HIGH (ref 70–99)

## 2013-08-07 NOTE — Progress Notes (Signed)
Physical Therapy Session Note  Patient Details  Name: Holly Ellison MRN: [id number] Date of Birth: 02-27-1940  Today's Date: 08/07/2013 Time: 9629-5284 and 1324-4010 Time Calculation (min): 10 min and 45 min  Short Term Goals: Week 3:  PT Short Term Goal 1 (Week 3): STGs=LTGs due to anticipated d/c to SNF  Skilled Therapeutic Interventions/Progress Updates:   AM Session: Pt received sidelying in bed, reporting pain and cold. Max encouragement provided for participation. Pt with breakfast tray untouched, therapist able to coax patient to eat 3 bites of fresh fruit cup. Pt refusing further skilled physical therapy, missed 35 minutes. Pt reports she will be participate in afternoon session. RN aware. Will f/u per POC.   PM Session: Pt received sidelying in bed, initially refusing therapy. Max encouragement to sit EOB. Pt reporting improvement in pain and that she will "start to be nice tomorrow since I got my paperwork figured out and it's legal." Pt coaxed into sitting EOB. Supine <> sit with HOB slightly elevated and with bed rail as well as from flat bed, supervision. Seated EOB, pt performed BLE strengthening 10x each exercise: LAQ, hip flex, ankle pumps. Pt refusing ambulation or transferring into w/c. Pt performed sit <> stand x 5 using RW from EOB with supervision-min guard, required supine rest break, pt performed 2 more sit <> stand then refused to participate further. Pt with no c/o back pain this session. Repositioned in bed in sidelying with pillows for comfort, all needs within reach. Pt missed 15 minutes of skilled physical therapy due to refusing to participate. RN aware.   Therapy Documentation Precautions:  Precautions Precautions: Back;Fall Precaution Comments: Fistula L arm Required Braces or Orthoses: Spinal Brace Restrictions Weight Bearing Restrictions: No General: Amount of Missed PT Time (min): 35 Minutes AM Session, 15 Minutes PM Session Missed Time Reason: Patient  unwilling/refused to participate without medical reason Vital Signs: Therapy Vitals Pulse Rate: 90 BP: 178/90 mmHg Pain: Pain Assessment Pain Assessment: Faces Pain Score: 7  Pain Type: Acute pain Pain Location: Back Pain Orientation: Lower Pain Intervention(s): RN made aware;Repositioned  See FIM for current functional status  Therapy/Group: Individual Therapy  Laretta Alstrom 08/07/2013, 9:50 AM

## 2013-08-07 NOTE — Progress Notes (Signed)
Social Work Patient ID: Holly Ellison, female   DOB: 06/07/1940, 73 y.o.   MRN: [id number]  Spoke with pt and son on Monday and yesterday following team conference.  Son with concern about pt's limited progress and feels that SNF placement is best option for her at this time.  Pt in agreement with this plan "as long as he is...".  Reviewed team report and team concerns about her limited progress.  Pt simply nods in agreement and then becomes tearful.  Son offering support.  Feels pt is depressed and worried about her husband (who is in [place] in [place].)  Will continue to follow for support and d/c planning needs.  Will put on schedule to be seen by neuropsychology next week if still on unit.  Natesha Hassey, LCSW

## 2013-08-07 NOTE — Patient Care Conference (Signed)
Inpatient RehabilitationTeam Conference and Plan of Care Update Date: 08/06/2013   Time: 3:10 PM    Patient Name: Holly Ellison      Medical Record Number: [id number]  Date of Birth: 07-Aug-1940 Sex: Female         Room/Bed: 4M04C/4M04C-01 Payor Info: Payor: MEDICARE / Plan: MEDICARE PART A AND B / Product Type: *No Product type* /    Admitting Diagnosis: T11 sp cord decomp   Admit Date/Time:  07/19/2013  5:07 PM Admission Comments: No comment available   Primary Diagnosis:  <principal problem not specified> Principal Problem: <principal problem not specified>  Patient Active Problem List   Diagnosis Date Noted  . Compression of spinal cord with myelopathy 07/19/2013  . Paraparesis 07/17/2013  . Bacteremia due to Staphylococcus aureus 07/28/2012  . HCAP (healthcare-associated pneumonia) 07/27/2012  . ESRD on dialysis 07/27/2012  . Chest pain 10/29/2011  . Hypertensive emergency 10/29/2011  . Bell's palsy 10/29/2011  . Hyperthyroidism 10/29/2011  . Pulmonary edema 06/05/2011  . 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 Peripheral neuropathy 06/05/2011  . Diabetes mellitus 06/05/2011  . Hypertension 06/05/2011  . COPD (chronic obstructive pulmonary disease) 06/05/2011  . Atherosclerosis of native arteries of the extremities with ulceration 05/25/2011    Expected Discharge Date: Expected Discharge Date:  (SNF)  Team Members Present: Physician leading conference: Dr. Alysia Penna Social Worker Present: Lennart Pall, LCSW Nurse Present: Dwaine Gale, RN PT Present: Melene Plan, PT OT Present: Gareth Morgan, OT;Jennifer Tamala Julian, OT SLP Present: Weston Anna, SLP PPS Coordinator present : Daiva Nakayama, RN, CRRN;Becky Alwyn Ren, PT     Current Status/Progress Goal Weekly Team Focus  Medical   Paraparesis, confusion overall improved. Pain overall improved still as headache  Improve mobility, decrease pain  Trial Topamax for headaches   Bowel/Bladder   Incontinent of bowel and bladder. LBM 08/03/13.   Managed bowel and bladder program  Continue with daily bowel program. Encourage pt to transfer to toilet   Swallow/Nutrition/ Hydration             ADL's   bathing/dressing-mod A/max A at bed level; transfers-mod A; decreased activity tolerance; decreased participation requiring max encouragement  bathing/dressing-supervision; toilet transfer-suprevision; tub transfer-min A  activity tolerance, transfers, UE strength, safety awareness, pain management; active participation   Mobility   Max encouragement for participation in therapy sessions. Close(S)-min A for bed mobility, sitting balance, mod A for bed<>chair transfers, Mod A for static standing w/ RW; Decreased participation in sessions due to pain and behavior.   Downgraded. Supervision for sitting balance. Min A for dynamic standing balance, bed mobility, transfers, w/c propulsion. Mod A for short distance ambulation  Participation, educatoin, tolerance, B LE strength, safety during functional mobility, pain management   Communication             Safety/Cognition/ Behavioral Observations            Pain   Back discomfort. 0.5mg  of Vicidon scheduled BID  <5  Assess for effectiveness   Skin   Old sternum incision healed. Thoracic incision, OTA, healing  No additional skin breakdown  Assess q shift    Rehab Goals Patient on target to meet rehab goals: No Rehab Goals Revised: very limited progress this week due to emotional and mental distress *See Care Plan and progress notes for long and short-term goals.  Barriers to Discharge: Mobility issues    Possible Resolutions to Barriers:  Family education    Discharge Planning/Teaching Needs:  d/c plan  has changed to SNF      Team Discussion:  Pt with poor participation and missing at least 50% treatment time. Usually with c/o pain as reason she cannot do tx.  Expressing depressive thoughts with SW.  Discussed restart of  antidepressant.  Very mixed picture of poor cognition, decreased mood, pain complaints and possible behavioral component that is affected overall progress.    Revisions to Treatment Plan:  D/c plan changed to SNF.  Restart of low dose anti-depressant.   Continued Need for Acute Rehabilitation Level of Care: The patient requires daily medical management by a physician with specialized training in physical medicine and rehabilitation for the following conditions: Daily direction of a multidisciplinary physical rehabilitation program to ensure safe treatment while eliciting the highest outcome that is of practical value to the patient.: Yes Daily medical management of patient stability for increased activity during participation in an intensive rehabilitation regime.: Yes Daily analysis of laboratory values and/or radiology reports with any subsequent need for medication adjustment of medical intervention for : Neurological problems;Other  Holly Ellison 08/07/2013, 6:10 AM

## 2013-08-07 NOTE — Progress Notes (Signed)
Patient ID: Holly Ellison, female   DOB: 08/15/1940, 73 y.o.   MRN: [id number] 73 y.o. female with history of DM type 2 with neuropathy, CAD, COPD, ESRD, HA, who was admitted on 07/16/13 pm with one week history of BLE, fecal incontinence and severe back pain. 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 Vertell Limber. Frozen section appeared benign and NS questions old or indolent osteomyelitis. Patient with history of MSSA bacteremia 07/2012 s/p 4 weeks of antibiotic therapy. CT chest without evidence of malignancy. HD ongoing and labs with evidence of ABLA with down to 7.1 as well as leucocytosis with WBC-16.0.    Subjective/Complaints: No new issues overnite Sleeping but arouses to voice   Review of Systems   All other systems reviewed and are negative.  Objective: Vital Signs: Blood pressure 111/52, pulse 86, temperature 98 F (36.7 C), temperature source Oral, resp. rate 18, height 5' 6.14" (1.68 m), weight 70.6 kg (155 lb 10.3 oz), SpO2 96.00%. No results found. Results for orders placed during the hospital encounter of 07/19/13 (from the past 72 hour(s))  GLUCOSE, CAPILLARY     Status: Abnormal   Collection Time    08/04/13  7:23 AM      Result Value Ref Range   Glucose-Capillary 107 (*) 70 - 99 mg/dL  GLUCOSE, CAPILLARY     Status: Abnormal   Collection Time    08/04/13 11:33 AM      Result Value Ref Range   Glucose-Capillary 162 (*) 70 - 99 mg/dL  GLUCOSE, CAPILLARY     Status: Abnormal   Collection Time    08/04/13  4:29 PM      Result Value Ref Range   Glucose-Capillary 139 (*) 70 - 99 mg/dL  GLUCOSE, CAPILLARY     Status: Abnormal   Collection Time    08/04/13  9:05 PM      Result Value Ref Range   Glucose-Capillary 224 (*) 70 - 99 mg/dL  GLUCOSE, CAPILLARY     Status: Abnormal   Collection Time    08/05/13  7:18 AM       Result Value Ref Range   Glucose-Capillary 125 (*) 70 - 99 mg/dL   Comment 1 Notify RN    GLUCOSE, CAPILLARY     Status: Abnormal   Collection Time    08/05/13 11:17 AM      Result Value Ref Range   Glucose-Capillary 213 (*) 70 - 99 mg/dL   Comment 1 Notify RN    GLUCOSE, CAPILLARY     Status: Abnormal   Collection Time    08/05/13  4:18 PM      Result Value Ref Range   Glucose-Capillary 130 (*) 70 - 99 mg/dL  GLUCOSE, CAPILLARY     Status: Abnormal   Collection Time    08/05/13  8:43 PM      Result Value Ref Range   Glucose-Capillary 145 (*) 70 - 99 mg/dL  GLUCOSE, CAPILLARY     Status: Abnormal   Collection Time    08/06/13  7:24 AM      Result Value Ref Range   Glucose-Capillary 104 (*) 70 - 99 mg/dL   Comment 1 Notify RN    GLUCOSE, CAPILLARY     Status: Abnormal   Collection Time    08/06/13 11:36 AM      Result Value Ref Range  Glucose-Capillary 171 (*) 70 - 99 mg/dL   Comment 1 Notify RN    RENAL FUNCTION PANEL     Status: Abnormal   Collection Time    08/06/13  2:30 PM      Result Value Ref Range   Sodium 133 (*) 137 - 147 mEq/L   Potassium 3.5 (*) 3.7 - 5.3 mEq/L   Chloride 88 (*) 96 - 112 mEq/L   CO2 25  19 - 32 mEq/L   Glucose, Bld 178 (*) 70 - 99 mg/dL   BUN 37 (*) 6 - 23 mg/dL   Creatinine, Ser 6.07 (*) 0.50 - 1.10 mg/dL   Calcium 8.8  8.4 - 10.5 mg/dL   Phosphorus 5.6 (*) 2.3 - 4.6 mg/dL   Albumin 2.9 (*) 3.5 - 5.2 g/dL   GFR calc non Af Amer 6 (*) >90 mL/min   GFR calc Af Amer 7 (*) >90 mL/min   Comment: (NOTE)     The eGFR has been calculated using the CKD EPI equation.     This calculation has not been validated in all clinical situations.     eGFR's persistently <90 mL/min signify possible Chronic Kidney     Disease.  CBC     Status: Abnormal   Collection Time    08/06/13  2:30 PM      Result Value Ref Range   WBC 7.6  4.0 - 10.5 K/uL   RBC 2.68 (*) 3.87 - 5.11 MIL/uL   Hemoglobin 8.6 (*) 12.0 - 15.0 g/dL   HCT 26.5 (*) 36.0 - 46.0 %    MCV 98.9  78.0 - 100.0 fL   MCH 32.1  26.0 - 34.0 pg   MCHC 32.5  30.0 - 36.0 g/dL   RDW 16.6 (*) 11.5 - 15.5 %   Platelets 364  150 - 400 K/uL  GLUCOSE, CAPILLARY     Status: Abnormal   Collection Time    08/06/13  8:06 PM      Result Value Ref Range   Glucose-Capillary 100 (*) 70 - 99 mg/dL      Nursing note and vitals reviewed.  Constitutional: She is oriented to person, place, and time. She appears well-developed and well-nourished. She is awake HENT:  Head: Normocephalic and atraumatic.  Oropharynx- poor dentition, moist and clear Eyes: Conjunctivae are normal. Pupils are equal, round, and reactive to light.  Neck: Normal range of motion. Neck supple.  Cardiovascular: Normal rate and regular rhythm.  Respiratory: Effort normal and breath sounds normal. No respiratory distress. She has no wheezes.  GI: Soft. Bowel sounds are normal. She exhibits no distension. There is no tenderness.  Musculoskeletal: She exhibits no edema, back remains tender.  Neurological: She is oriented to person, cooperative. Follows commands. Fair insight.  Bilateral hands with thenar atrophy and contracture with decrease in fine motor movements. Numbness stocking-glove distribution persists.  Skin: Skin is warm and dry.   4 minus bilateral deltoid, bicep, tricep 3 minus bilateral finger flexors and extensors  2 minus bilateral hip flexors 3 minus bilaterally knee extensors 2 minus bilateral ankle dorsiflexors and plantar flexors Psych: sl confusion, calm,cooperative  Assessment/Plan: 1. Functional deficits secondary to T11 lesion with paraplegia which require 3+ hours per day of interdisciplinary therapy in a comprehensive inpatient rehab setting. Physiatrist is providing close team supervision and 24 hour management of active medical problems listed below. Physiatrist and rehab team continue to assess barriers to discharge/monitor patient progress toward functional and medical goals.    FIM:  FIM  - Bathing Bathing Steps Patient Completed: Chest;Right Arm;Left Arm;Abdomen Bathing: 2: Max-Patient completes 3-4 [id number] parts or 25-49%  FIM - Upper Body Dressing/Undressing Upper body dressing/undressing steps patient completed: Thread/unthread right sleeve of pullover shirt/dresss;Thread/unthread left sleeve of pullover shirt/dress;Put head through opening of pull over shirt/dress Upper body dressing/undressing: 4: Min-Patient completed 75 plus % of tasks FIM - Lower Body Dressing/Undressing Lower body dressing/undressing steps patient completed: Thread/unthread left underwear leg;Thread/unthread left pants leg Lower body dressing/undressing: 0: Wears gown/pajamas-no public clothing  FIM - Toileting Toileting steps completed by patient: Adjust clothing prior to toileting;Performs perineal hygiene;Adjust clothing after toileting Toileting Assistive Devices: Grab bar or rail for support Toileting: 0: Activity did not occur  FIM - TRadio producerDevices: Grab bars Toilet Transfers: 0-Activity did not occur  FIM - BControl and instrumentation engineerDevices: Bed rails;Orthosis Bed/Chair Transfer: 4: Supine > Sit: Min A (steadying Pt. > 75%/lift 1 leg);4: Sit > Supine: Min A (steadying pt. > 75%/lift 1 leg)  FIM - Locomotion: Wheelchair Distance: 60 Locomotion: Wheelchair: 0: Activity did not occur FIM - Locomotion: Ambulation Locomotion: Ambulation Assistive Devices: Parallel bars Ambulation/Gait Assistance: 4: Min assist Locomotion: Ambulation: 0: Activity did not occur  Comprehension Comprehension Mode: Auditory Comprehension: 5-Follows basic conversation/direction: With no assist  Expression Expression Mode: Verbal Expression: 4-Expresses basic 75 - 89% of the time/requires cueing 10 - 24% of the time. Needs helper to occlude trach/needs to repeat words.  Social Interaction Social Interaction: 3-Interacts appropriately 50 - 74% of the  time - May be physically or verbally inappropriate.  Problem Solving Problem Solving: 3-Solves basic 50 - 74% of the time/requires cueing 25 - 49% of the time  Memory Memory: 3-Recognizes or recalls 50 - 74% of the time/requires cueing 25 - 49% of the time  Medical Problem List and Plan:  1. 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  2. DVT Prophylaxis/Anticoagulation: Mechanical: Antiembolism stockings, thigh (TED hose) Bilateral lower extremities  Sequential compression devices, below knee Bilateral lower extremities  3. Pain Management: lidoderm patches for back.   -utilize, tylenol, ice, heat--   -resumed LOW dose scheduled hydrocodone at 7am and 12 noon each day to coincide with therapy History of migraines, no prophyllaxis- not relieved by tylenol, trial topamax qhs , monitor sedation 4. H/o depression/Mood: Resume lexapro. LCSW to follow for evaluation and support.  5. Neuropsych: This patient is not currently capable of making decisions on her own behalf.  6. ESRD: Continue HD on TTS after therapy sessions.  7. DM type 2 with peripheral neuropathy:  Continue lantus insulin with SSI for elevated BS. Inconsistent PO -hypoglycemic yesterday  -decrease lantus to 10units 8. CAD: Continue plavix, lipitor, lopressor and ranexa.  9. Leucocytosis: Monitor for signs of infection. WBC 11.3 10. HTN:  norvasc  544mdaily---increase to 104m  -  Prn po clonidine in place.  11. Acute on chronic anemia: On aranesp. Transfusion with dialysis prn. Stable/improving 12. AMS: baseline dementia. Episodic confusion, oriented thus far today   -will need to be conservative with pain measures.   LOS (Days) 19 A FACE TO FACE EVALUATION WAS PERFORMED  Azhia Siefken E 08/07/2013, 6:28 AM

## 2013-08-07 NOTE — Progress Notes (Signed)
Occupational Therapy Session Note  Patient Details  Name: Holly Ellison MRN: [id number] Date of Birth: [birth year]  Today's Date: 08/07/2013  Session 1 Time: 0800-0856 Time Calculation (min): 56 min  Short Term Goals: Week 2:  OT Short Term Goal 1 (Week 2): Pt will consistently transfer to [facility] with mod A OT Short Term Goal 2 (Week 2): Pt will stand with mod A to complete LB bathing tasks OT Short Term Goal 3 (Week 2): Pt will perform LB bathing with mod A using AE prn  Skilled Therapeutic Interventions/Progress Updates:    Pt resting in bed upon arrival and agreeable to getting OOB for bathing and dressing [name].  Pt required extra time and min multimodal cues to initiate sitting EOB.  Pt required max A to sit EOB but was able to maintain sitting balance with supervision in preparation for stand pivot transfer to w/c.  Pt required continual encouragement to actively participate in bathing and dressing tasks once sitting in w/c.  Pt continually prefers to sit leaning forward in w/c despite continue reeducation regarding back precautions.  Pt incontinent of bowel and not aware.  Pt performed sit<>stand from w/c at sink with mod A and steady A while standing at sink.  Pt remained in w/c with QRB in place and breakfast tray in front of her.  Pt agreed to remain in w/c until following therapy.  Focus on activity tolerance, bed mobility, transfers, sit<>stand, standing balance, and safety awareness.  Therapy Documentation Precautions:  Precautions Precautions: Back;Fall Precaution Comments: Fistula L arm Required Braces or Orthoses: Spinal Brace Restrictions Weight Bearing Restrictions: No Pain: Pain Assessment Pain Assessment: Faces Pain Score: 7  Pain Type: Acute pain Pain Location: Back Pain Orientation: Lower Pain Intervention(s): RN made aware;Repositioned  See FIM for current functional status  Therapy/Group: Individual Therapy  Session 2 Time: 4742-5956 Pt c/o 6/10 pain  with bed mobility activities; RN aware and repositioned; heat applied Individual Therapy  Pt resting in bed upon arrival.  Pt adamantly declined getting OOB but agreeable to bed mobility and sitting EOB.  Pt required min A for bed mobility and sitting EOB this afternoon.  Pt acknowledges that she is aware of importance of increased activity to build strength and endurance but patient states that sometimes she can't make herself do it.  Encouragement and emotional support provided.  Focus on bed mobility and increased activity tolerance.   Leotis Shames [facility] 08/07/2013, 8:59 AM

## 2013-08-08 ENCOUNTER — Inpatient Hospital Stay (HOSPITAL_COMMUNITY): Payer: Medicare Other

## 2013-08-08 ENCOUNTER — Inpatient Hospital Stay (HOSPITAL_COMMUNITY): Payer: Medicare Other | Admitting: Physical Therapy

## 2013-08-08 ENCOUNTER — Encounter (HOSPITAL_COMMUNITY): Payer: Medicare Other

## 2013-08-08 DIAGNOSIS — G8220 Paraplegia, unspecified: Secondary | ICD-10-CM

## 2013-08-08 LAB — GLUCOSE, CAPILLARY
Glucose-Capillary: 168 mg/dL — ABNORMAL HIGH (ref 70–99)
Glucose-Capillary: 92 mg/dL (ref 70–99)
Glucose-Capillary: 88 mg/dL (ref 70–99)

## 2013-08-08 MED ORDER — DARBEPOETIN ALFA-POLYSORBATE 100 MCG/0.5ML IJ SOLN
INTRAMUSCULAR | Status: AC
  Filled 2013-08-08: qty 0.5

## 2013-08-08 NOTE — Progress Notes (Signed)
NS consulted for input on ongoing issues with pain, difficulty with mobility as well as weakness. Dr. Hal Neer recommended follow up MRI of thoracic spine to evaluate healing and rule out any post op issues.

## 2013-08-08 NOTE — Progress Notes (Signed)
Occupational Therapy Weekly Progress Note  Patient Details  Name: Holly Ellison MRN: [id number] Date of Birth: [birth year]  Beginning of progress report period: July 30, 2013 End of progress report period: August 08, 2013  Today's Date: 08/08/2013  Patient has met 0 of 3 short term goals. Pt progress has been inconsistent during this past reporting period.  Pt requires max encouragement to actively participate in therapy and has missed all or part of several therapy sessions.  Pt continues to experience increased pain with transtiontional movements including rolling in bed, supine<>sit EOB, transfers, and standing at sink to engage in LB bathing tasks.  Pt performs supine->sit EOB, transfers and sit<>stand with min A on an inconsistent basis and often requires max A to perform these tasks.  Pt is frequently tearful during therapy sessions and is unable to actively participate as a result.  CSW has been notified of patient's ongoing conversations that she doesn't want to do anything and she just wants to give up.  LTGs have been downgraded as a result of patient's lack of progress.  Patient continues to demonstrate the following deficits: abnormal posture, decreased sitting and standing balance, acute low back pain, muscle weakness (generalized), decreased cognition (sustained attention, memory, awareness, problem solving and sequencing-question if related related to pain vs medication vs depression), decreased cardiorespiratory endurance, impaired sensation, and decreased coordination.  Therefore, patient will continue to benefit from skilled OT intervention to enhance overall performance with BADL and Reduce care partner burden.  Patient not progressing toward long term goals.  See goal revision in Care Plan.  OT Short Term Goals Week 2:  OT Short Term Goal 1 (Week 2): Pt will consistently transfer to [facility] with mod A OT Short Term Goal 1 - Progress (Week 2): Partly met OT Short Term Goal 2 (Week 2): Pt  will stand with mod A to complete LB bathing tasks OT Short Term Goal 2 - Progress (Week 2): Progressing toward goal OT Short Term Goal 3 (Week 2): Pt will perform LB bathing with mod A using AE prn OT Short Term Goal 3 - Progress (Week 2): Progressing toward goal Week 3:  OT Short Term Goal 1 (Week 3): Pt will consistently transfer to [facility] with mod A OT Short Term Goal 2 (Week 3): Pt will perform LB bathing with mod A using AE prn OT Short Term Goal 3 (Week 3): Pt will consistently stand with mod A to perform LB bathing   Therapy Documentation Precautions:  Precautions Precautions: Back;Fall Precaution Comments: Fistula L arm Required Braces or Orthoses: Spinal Brace Restrictions Weight Bearing Restrictions: No  See FIM for current functional status  Leroy Libman 08/08/2013, 6:44 AM

## 2013-08-08 NOTE — Progress Notes (Signed)
Patient ID: Holly Ellison, female   DOB: [birth year], 73 y.o.   MRN: [id number] 73 y.o. female with history of DM type 2 with neuropathy, CAD, COPD, ESRD, HA, who was admitted on 07/16/13 pm with one week history of BLE, fecal incontinence and severe back pain. 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 Vertell Limber. Frozen section appeared benign and NS questions old or indolent osteomyelitis. Patient with history of MSSA bacteremia 07/2012 s/p 4 weeks of antibiotic therapy. CT chest without evidence of malignancy. HD ongoing and labs with evidence of ABLA with down to 7.1 as well as leucocytosis with WBC-16.0.    Subjective/Complaints: Pt c/o feeling cold.  No sig back pain in bed Admits to being depressed   Review of Systems   All other systems reviewed and are negative.  Objective: Vital Signs: Blood pressure 124/56, pulse 86, temperature 98.2 F (36.8 C), temperature source Oral, resp. rate 18, height 5' 6.14" (1.68 m), weight 70.5 kg (155 lb 6.8 oz), SpO2 98.00%. No results found. Results for orders placed during the hospital encounter of 07/19/13 (from the past 72 hour(s))  GLUCOSE, CAPILLARY     Status: Abnormal   Collection Time    08/05/13  7:18 AM      Result Value Ref Range   Glucose-Capillary 125 (*) 70 - 99 mg/dL   Comment 1 Notify RN    GLUCOSE, CAPILLARY     Status: Abnormal   Collection Time    08/05/13 11:17 AM      Result Value Ref Range   Glucose-Capillary 213 (*) 70 - 99 mg/dL   Comment 1 Notify RN    GLUCOSE, CAPILLARY     Status: Abnormal   Collection Time    08/05/13  4:18 PM      Result Value Ref Range   Glucose-Capillary 130 (*) 70 - 99 mg/dL  GLUCOSE, CAPILLARY     Status: Abnormal   Collection Time    08/05/13  8:43 PM      Result Value Ref Range   Glucose-Capillary 145 (*) 70 - 99 mg/dL  GLUCOSE,  CAPILLARY     Status: Abnormal   Collection Time    08/06/13  7:24 AM      Result Value Ref Range   Glucose-Capillary 104 (*) 70 - 99 mg/dL   Comment 1 Notify RN    GLUCOSE, CAPILLARY     Status: Abnormal   Collection Time    08/06/13 11:36 AM      Result Value Ref Range   Glucose-Capillary 171 (*) 70 - 99 mg/dL   Comment 1 Notify RN    RENAL FUNCTION PANEL     Status: Abnormal   Collection Time    08/06/13  2:30 PM      Result Value Ref Range   Sodium 133 (*) 137 - 147 mEq/L   Potassium 3.5 (*) 3.7 - 5.3 mEq/L   Chloride 88 (*) 96 - 112 mEq/L   CO2 25  19 - 32 mEq/L   Glucose, Bld 178 (*) 70 - 99 mg/dL   BUN 37 (*) 6 - 23 mg/dL   Creatinine, Ser 6.07 (*) 0.50 - 1.10 mg/dL   Calcium 8.8  8.4 - 10.5 mg/dL   Phosphorus 5.6 (*) 2.3 - 4.6 mg/dL   Albumin 2.9 (*) 3.5 - 5.2 g/dL   GFR calc non Af Amer 6 (*) >  90 mL/min   GFR calc Af Amer 7 (*) >90 mL/min   Comment: (NOTE)     The eGFR has been calculated using the CKD EPI equation.     This calculation has not been validated in all clinical situations.     eGFR's persistently <90 mL/min signify possible Chronic Kidney     Disease.  CBC     Status: Abnormal   Collection Time    08/06/13  2:30 PM      Result Value Ref Range   WBC 7.6  4.0 - 10.5 K/uL   RBC 2.68 (*) 3.87 - 5.11 MIL/uL   Hemoglobin 8.6 (*) 12.0 - 15.0 g/dL   HCT 26.5 (*) 36.0 - 46.0 %   MCV 98.9  78.0 - 100.0 fL   MCH 32.1  26.0 - 34.0 pg   MCHC 32.5  30.0 - 36.0 g/dL   RDW 16.6 (*) 11.5 - 15.5 %   Platelets 364  150 - 400 K/uL  GLUCOSE, CAPILLARY     Status: Abnormal   Collection Time    08/06/13  8:06 PM      Result Value Ref Range   Glucose-Capillary 100 (*) 70 - 99 mg/dL  GLUCOSE, CAPILLARY     Status: Abnormal   Collection Time    08/07/13  7:15 AM      Result Value Ref Range   Glucose-Capillary 111 (*) 70 - 99 mg/dL   Comment 1 Notify RN    GLUCOSE, CAPILLARY     Status: Abnormal   Collection Time    08/07/13  9:18 AM      Result Value Ref Range    Glucose-Capillary 119 (*) 70 - 99 mg/dL  GLUCOSE, CAPILLARY     Status: Abnormal   Collection Time    08/07/13 11:20 AM      Result Value Ref Range   Glucose-Capillary 125 (*) 70 - 99 mg/dL  GLUCOSE, CAPILLARY     Status: Abnormal   Collection Time    08/07/13  4:34 PM      Result Value Ref Range   Glucose-Capillary 167 (*) 70 - 99 mg/dL   Comment 1 Notify RN    GLUCOSE, CAPILLARY     Status: Abnormal   Collection Time    08/07/13  8:26 PM      Result Value Ref Range   Glucose-Capillary 198 (*) 70 - 99 mg/dL      Nursing note and vitals reviewed.  Constitutional: She is oriented to person, place, and time. She appears well-developed and well-nourished. She is awake HENT:  Head: Normocephalic and atraumatic.  Oropharynx- poor dentition, moist and clear Eyes: Conjunctivae are normal. Pupils are equal, round, and reactive to light.  Neck: Normal range of motion. Neck supple.  Cardiovascular: Normal rate and regular rhythm.  Respiratory: Effort normal and breath sounds normal. No respiratory distress. She has no wheezes.  GI: Soft. Bowel sounds are normal. She exhibits no distension. There is no tenderness.  Musculoskeletal: She exhibits no edema, back remains tender.  Neurological: She is oriented to person, cooperative. Follows commands. Fair insight.  Bilateral hands with thenar atrophy and contracture with decrease in fine motor movements. Numbness stocking-glove distribution persists.  Skin: Skin is warm and dry.   4 minus bilateral deltoid, bicep, tricep 3 minus bilateral finger flexors and extensors  2 minus bilateral hip flexors 3 minus bilaterally knee extensors 2 minus bilateral ankle dorsiflexors and plantar flexors Psych: oriented to person, place HD day, calm,cooperative  Assessment/Plan: 1. Functional deficits secondary to T11 lesion with paraplegia which require 3+ hours per day of interdisciplinary therapy in a comprehensive inpatient rehab setting. Physiatrist  is providing close team supervision and 24 hour management of active medical problems listed below. Physiatrist and rehab team continue to assess barriers to discharge/monitor patient progress toward functional and medical goals.    FIM: FIM - Bathing Bathing Steps Patient Completed: Chest;Right Arm;Left Arm;Abdomen Bathing: 2: Max-Patient completes 3-4 48f10 parts or 25-49%  FIM - Upper Body Dressing/Undressing Upper body dressing/undressing steps patient completed: Thread/unthread left sleeve of pullover shirt/dress Upper body dressing/undressing: 2: Max-Patient completed 25-49% of tasks FIM - Lower Body Dressing/Undressing Lower body dressing/undressing steps patient completed: Thread/unthread left underwear leg;Thread/unthread left pants leg Lower body dressing/undressing: 1: Total-Patient completed less than 25% of tasks  FIM - Toileting Toileting steps completed by patient: Adjust clothing prior to toileting;Performs perineal hygiene;Adjust clothing after toileting Toileting Assistive Devices: Grab bar or rail for support Toileting: 0: Activity did not occur  FIM - TRadio producerDevices: Grab bars Toilet Transfers: 0-Activity did not occur  FIM - BControl and instrumentation engineerDevices: HOB elevated;Bed rails Bed/Chair Transfer: 5: Supine > Sit: Supervision (verbal cues/safety issues);5: Sit > Supine: Supervision (verbal cues/safety issues)  FIM - Locomotion: Wheelchair Distance: 60 Locomotion: Wheelchair: 0: Activity did not occur FIM - Locomotion: Ambulation Locomotion: Ambulation Assistive Devices: Parallel bars Ambulation/Gait Assistance: 4: Min assist Locomotion: Ambulation: 0: Activity did not occur  Comprehension Comprehension Mode: Auditory Comprehension: 5-Follows basic conversation/direction: With extra time/assistive device  Expression Expression Mode: Verbal Expression: 4-Expresses basic 75 - 89% of the  time/requires cueing 10 - 24% of the time. Needs helper to occlude trach/needs to repeat words.  Social Interaction Social Interaction: 3-Interacts appropriately 50 - 74% of the time - May be physically or verbally inappropriate.  Problem Solving Problem Solving: 3-Solves basic 50 - 74% of the time/requires cueing 25 - 49% of the time  Memory Memory: 3-Recognizes or recalls 50 - 74% of the time/requires cueing 25 - 49% of the time  Medical Problem List and Plan:  1. 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  2. DVT Prophylaxis/Anticoagulation: Mechanical: Antiembolism stockings, thigh (TED hose) Bilateral lower extremities  Sequential compression devices, below knee Bilateral lower extremities  3. Pain Management: lidoderm patches for back.   -utilize, tylenol, ice, heat--   -resumed LOW dose scheduled hydrocodone at 7am and 12 noon each day to coincide with therapy History of migraines, no prophyllaxis- not relieved by tylenol, trial topamax qhs , monitor sedation 4. H/o depression/Mood: Resume lexapro. LCSW to follow for evaluation and support.  5. Neuropsych: This patient is not currently capable of making decisions on her own behalf.  6. ESRD: Continue HD on TTS after therapy sessions.  7. 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 Inconsistent PO   - lantus to 10units 8. CAD: Continue plavix, lipitor, lopressor and ranexa.  9. Leucocytosis: Monitor for signs of infection. WBC 11.3 10. HTN:  norvasc  58mdaily---increase to 1020m  -  Prn po clonidine in place.  11. Acute on chronic anemia: On aranesp. Transfusion with dialysis prn. Stable/improving 12. AMS: baseline dementia. Episodic confusion, oriented thus far today   -monitor on 5mg71mdrocodone  LOS (Days) 20 A FACE TO FACE EVALUATION WAS PERFORMED  Jaala Bohle E 08/08/2013, 7:08 AM

## 2013-08-08 NOTE — Progress Notes (Signed)
Subjective:  Feels much better, "I finally got some sleep!"  Objective: Vital signs in last 24 hours: Temp:  [97.5 F (36.4 C)-99.3 F (37.4 C)] 98.2 F (36.8 C) (07/02 0513) Pulse Rate:  [73-86] 86 (07/02 0513) Resp:  [18] 18 (07/02 0513) BP: (124-149)/(42-69) 124/56 mmHg (07/02 0513) SpO2:  [97 %-98 %] 98 % (07/02 0513) Weight:  [70.5 kg (155 lb 6.8 oz)] 70.5 kg (155 lb 6.8 oz) (07/02 0513) Weight change: -0.1 kg (-3.5 oz)  Intake/Output from previous day: 07/01 0701 - 07/02 0700 In: 840 [P.O.:840] Out: -  Intake/Output this shift:    Lab Results:  Recent Labs  08/06/13 1430  WBC 7.6  HGB 8.6*  HCT 26.5*  PLT 364   BMET:   Recent Labs  08/06/13 1430  NA 133*  K 3.5*  CL 88*  CO2 25  GLUCOSE 178*  BUN 37*  CREATININE 6.07*  CALCIUM 8.8  ALBUMIN 2.9*   No results found for this basename: PTH,  in the last 72 hours Iron Studies: No results found for this basename: IRON, TIBC, TRANSFERRIN, FERRITIN,  in the last 72 hours  Studies/Results: No results found.  EXAM:  Gen: Alert, in no apparent distress Resp: CTA without rales, rhonchi, or wheezes  Cardio: RRR without murmur or rub  GI: + BS, soft, nontender Ext: No edema  Access: AVF @ LUA with + bruit   HD: [facility]  4h  2/2.25 Bath   73kg   LUA AVF    Heparin 4500  Aranesp 25 mcg q 4 weeks, last 6/9 Hectorol 4 ug TIW  Tsat 26%, ferr 1119, pth 286  Assessment/Plan:  1. 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 Debility- on rehab, may have reached max benefit, possible SNF d/c 3. ESRD on HD 4. HTN/Volume - new lower dry wt, on norvasc and BB 5. Anemia cont aranesp 100 q week 6. 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  7. Nutrition - Alb 2.9, renal carb-mod diet, vitamin.  8. CAD - 7 stents, CABG, on Ranexa.  9. DM - insulin per primary  Plan-- HD  today  [name] MD (pgr) [phone]    ([phone] 08/08/2013, 10:38 AM

## 2013-08-08 NOTE — Progress Notes (Signed)
Physical Therapy Session Note  Patient Details  Name: Holly Ellison MRN: [id number] Date of Birth: [birth year]  Today's Date: 08/08/2013 Time: 24 and 1350   Short Term Goals: Week 3:  PT Short Term Goal 1 (Week 3): STGs=LTGs due to anticipated d/c to SNF  Skilled Therapeutic Interventions/Progress Updates:   AM Session: Pt received sidelying in bed, immediately began to cry upon seeing therapist and stating, "I don't want to be here. Nobody cares about me. I want to die." Emotional support provided. Patient missed 60 minutes of skilled physical therapy due to refusal to participate. RN aware.   PM Session: Pt received sidelying in bed, reporting improvement in mood but upset about relationship with daughter. Max encouragement/coaxing for bed mobility/OOB activity x 10 min. Pt missed 45 minutes of skilled physical therapy due to refusal to participate. RN aware.    Therapy Documentation Precautions:  Precautions Precautions: Back;Fall Precaution Comments: Fistula L arm Required Braces or Orthoses: Spinal Brace Restrictions Weight Bearing Restrictions: No General: Amount of Missed PT Time (min): 60 Minutes, 45 Minutes Missed Time Reason: Patient unwilling/refused to participate without medical reason Pain: Pain Assessment Pain Assessment: 0-10 No c/o pain  See FIM for current functional status  Therapy/Group: Individual Therapy  Laretta Alstrom 08/08/2013, 11:13 AM

## 2013-08-08 NOTE — Progress Notes (Signed)
Occupational Therapy Note  Patient Details  Name: Holly Ellison MRN: [id number] Date of Birth: 03-26-1940 Today's Date: 08/08/2013  Pt missed 60 mins skilled OT services.  Pt asleep upon arrival and required min tactile cue to awaken.  Pt initially agreeable to "getting up" but continued to close eyes and would not initiate any movement in bed.  Pt repeatedly stated "in a little while" but would not actively engaged in therapy.  Pt stated she was too tired.    Leotis Shames [facility] 08/08/2013, 8:57 AM

## 2013-08-08 NOTE — Progress Notes (Signed)
Physical Therapy Session Note  Patient Details  Name: Holly Ellison MRN: [id number] Date of Birth: 04/12/1940  Today's Date: 08/08/2013 Time: 6295-2841 Time Calculation (min): 28 min  Short Term Goals: PT Short Term Goal 1 (Week 3): STGs=LTGs due to anticipated d/c to SNF  Skilled Therapeutic Interventions/Progress Updates:   Pt. Supine in bed with HOB elevated. Pt. Indicates she is "bad." Pt. Required reassurance that she was not bad and encouraged to participate in recovery.  Therapeutic Activities: Pt. Performed log rolling, hip/shoulder repositioning, and moving to [facility] with flat bed/surface. Pt. Required frequent rest/recovery breaks and constant reassurance to complete tasks. Pt.Indicated she was having discomfort in her back, but maintained participation levels.  Therapeutic Exercises: Heel slides 2 x 5 ea. BLE with verbal and tactile cues for completion and correct technique. Ankle pumps BLE with verbal cues for completion. Quad setting 3 x 30 sec holds ea LE; Pt. Has difficulty maintaining consistent contraction due to deficits.  Pain: no c/o pain. Pt. Described back discomfort as "always bothers me."  Pt. Supine in bed with HOB elevated for comfort with all needs within reach.  Therapy Documentation Precautions:  Precautions Precautions: Back;Fall Precaution Comments: Fistula L arm Required Braces or Orthoses: Spinal Brace Restrictions Weight Bearing Restrictions: No  See FIM for current functional status  Therapy/Group: Individual Therapy  Juluis Mire 08/08/2013, 3:23 PM

## 2013-08-08 NOTE — Progress Notes (Signed)
Pt signed off of hemodialysis Tx 23 minutes early AMA. Pt became nauseated and insisted on the Tx being stopped immediately. Low end of fluid removal goal was still accomplished despite signing off early

## 2013-08-08 NOTE — Progress Notes (Signed)
Social Work Patient ID: Marvetta Vohs, female   DOB: 1940-09-15, 73 y.o.   MRN: [id number]  Lennart Pall, LCSW Social Worker Signed  Patient Care Conference Service date: 08/07/2013 6:10 AM  Inpatient RehabilitationTeam Conference and Plan of Care Update Date: 08/06/2013   Time: 3:10 PM     Patient Name: Laverle Pillard       Medical Record Number: [id number]   Date of Birth: 02-Jun-1940 Sex: Female         Room/Bed: 4M04C/4M04C-01 Payor Info: Payor: MEDICARE / Plan: MEDICARE PART A AND B / Product Type: *No Product type* /   Admitting Diagnosis: T11 sp cord decomp   Admit Date/Time:  07/19/2013  5:07 PM Admission Comments: No comment available   Primary Diagnosis:  <principal problem not specified> Principal Problem: <principal problem not specified>    Patient Active Problem List     Diagnosis  Date Noted   .  Compression of spinal cord with myelopathy  07/19/2013   .  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  Bacteremia due to Staphylococcus aureus  07/28/2012   .  HCAP (healthcare-associated pneumonia)  07/27/2012   .  ESRD on dialysis  07/27/2012   .  Chest pain  10/29/2011   .  Hypertensive emergency  10/29/2011   .  Bell's palsy  10/29/2011   .  Hyperthyroidism  10/29/2011   .  Pulmonary edema  06/05/2011   .  CAD (coronary artery disease) of bypass graft  06/05/2011   .  Peripheral neuropathy  06/05/2011   .  Diabetes mellitus  06/05/2011   .  Hypertension  06/05/2011   .  COPD (chronic obstructive pulmonary disease)  06/05/2011   .  Atherosclerosis of native arteries of the extremities with ulceration  05/25/2011     Expected Discharge Date: Expected Discharge Date:  (SNF)  Team Members Present: Physician leading conference: Dr. Alysia Penna Social Worker Present: Lennart Pall, LCSW Nurse Present: Dwaine Gale, RN PT Present: Melene Plan, PT OT Present: Gareth Morgan, OT;Jennifer Tamala Julian, OT SLP Present: Weston Anna, SLP PPS Coordinator present : Daiva Nakayama, RN, CRRN;Becky  Alwyn Ren, PT        Current Status/Progress  Goal  Weekly Team Focus   Medical     Paraparesis, confusion overall improved. Pain overall improved still as headache  Improve mobility, decrease pain  Trial Topamax for headaches   Bowel/Bladder     Incontinent of bowel and bladder. LBM 08/03/13.   Managed bowel and bladder program  Continue with daily bowel program. Encourage pt to transfer to toilet   Swallow/Nutrition/ Hydration            ADL's     bathing/dressing-mod A/max A at bed level; transfers-mod A; decreased activity tolerance; decreased participation requiring max encouragement  bathing/dressing-supervision; toilet transfer-suprevision; tub transfer-min A  activity tolerance, transfers, UE strength, safety awareness, pain management; active participation   Mobility     Max encouragement for participation in therapy sessions. Close(S)-min A for bed mobility, sitting balance, mod A for bed<>chair transfers, Mod A for static standing w/ RW; Decreased participation in sessions due to pain and behavior.   Downgraded. Supervision for sitting balance. Min A for dynamic standing balance, bed mobility, transfers, w/c propulsion. Mod A for short distance ambulation  Participation, educatoin, tolerance, B LE strength, safety during functional mobility, pain management   Communication            Safety/Cognition/ Behavioral Observations  Pain     Back discomfort. 0.5mg  of Vicidon scheduled BID  <5  Assess for effectiveness   Skin     Old sternum incision healed. Thoracic incision, OTA, healing  No additional skin breakdown  Assess q shift    Rehab Goals Patient on target to meet rehab goals: No Rehab Goals Revised: very limited progress this week due to emotional and mental distress *See Care Plan and progress notes for long and short-term goals.    Barriers to Discharge:  Mobility issues      Possible Resolutions to Barriers:    Family education      Discharge  Planning/Teaching Needs:    d/c plan has changed to SNF      Team Discussion:    Pt with poor participation and missing at least 50% treatment time. Usually with c/o pain as reason she cannot do tx.  Expressing depressive thoughts with SW.  Discussed restart of antidepressant.  Very mixed picture of poor cognition, decreased mood, pain complaints and possible behavioral component that is affected overall progress.     Revisions to Treatment Plan:    D/c plan changed to SNF.  Restart of low dose anti-depressant.    Continued Need for Acute Rehabilitation Level of Care: The patient requires daily medical management by a physician with specialized training in physical medicine and rehabilitation for the following conditions: Daily direction of a multidisciplinary physical rehabilitation program to ensure safe treatment while eliciting the highest outcome that is of practical value to the patient.: Yes Daily medical management of patient stability for increased activity during participation in an intensive rehabilitation regime.: Yes Daily analysis of laboratory values and/or radiology reports with any subsequent need for medication adjustment of medical intervention for : Neurological problems;Other  Jaizon Deroos 08/07/2013, 6:10 AM         Lennart Pall, LCSW Social Worker Signed  Patient Care Conference Service date: 07/31/2013 6:42 AM  Inpatient RehabilitationTeam Conference and Plan of Care Update Date: 07/30/2013   Time: 3:15 PM     Patient Name: Janille Draughon       Medical Record Number: [id number]   Date of Birth: 1940/11/15 Sex: Female         Room/Bed: 4M11C/4M11C-01 Payor Info: Payor: MEDICARE / Plan: MEDICARE PART A AND B / Product Type: *No Product type* /   Admitting Diagnosis: T11 sp cord decomp   Admit Date/Time:  07/19/2013  5:07 PM Admission Comments: No comment available   Primary Diagnosis:  <principal problem not specified> Principal Problem: <principal problem not  specified>    Patient Active Problem List     Diagnosis  Date Noted   .  Compression of spinal cord with myelopathy  07/19/2013   .  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  Bacteremia due to Staphylococcus aureus  07/28/2012   .  HCAP (healthcare-associated pneumonia)  07/27/2012   .  ESRD on dialysis  07/27/2012   .  Chest pain  10/29/2011   .  Hypertensive emergency  10/29/2011   .  Bell's palsy  10/29/2011   .  Hyperthyroidism  10/29/2011   .  Pulmonary edema  06/05/2011   .  CAD (coronary artery disease) of bypass graft  06/05/2011   .  Peripheral neuropathy  06/05/2011   .  Diabetes mellitus  06/05/2011   .  Hypertension  06/05/2011   .  COPD (chronic obstructive pulmonary disease)  06/05/2011   .  Atherosclerosis of native arteries of the extremities with  ulceration  05/25/2011     Expected Discharge Date: Expected Discharge Date: 08/08/13  Team Members Present:         Current Status/Progress  Goal  Weekly Team Focus   Medical     confusion---had to stop sedating meds---pain an issue  see prior  pain control. mental status   Bowel/Bladder     MOSTLY INCONTINENT OF BOPWEL AND BLADDER, PATIENT STATES UNABLE TO TELL WHEN NEEDING TO GO TO THE BATHROOM ALL THE TIME, CHECKED FOR IMPACTION, SOFT STOOL NOTED IN RECTAL VAULT, UBALE TO MOVE BOWELS.  CONSIDER BOWEL PROGRAM?  Patient to regain continence  Timed toileting patient q3h and prn   Swallow/Nutrition/ Hydration            ADL's     bathing/dressing-mod A/max A; transfers-mod A; decreased activity tolerance; some confusion  bathing/dressing-supervision; toilet transfer-suprevision; tub transfer-min A  activity tolerance, transfers, UE strength, safety awareness, pain management   Mobility     Close(S)-min A for sitting balance and w/c propulsion, Mod A for bed mobility, squat pivot transfers and short distance amb w/ RW  Supervision-Min A, combination of w/c and ambulation  functional endurance, tolerance, B LE strength, safety  during functional mobility, transfers, bed mobility, ambulation, intellectual/emergent awareness   Communication            Safety/Cognition/ Behavioral Observations    No safety issues noted       Pain     C/o mid back pain; getting some relief with po tylenol and lidocaine patches  <5  Assess and treat for pain q shift and prn   Skin     FRICTION TEAR AT SACRUM, BARRIER CREAM APPLIED, INCISION TO BACK OPEN TO AIR ,  DERMABOND PEELING.   No skin infection or breakdown during rehab stay  Assess skin q shift and prn    Rehab Goals Patient on target to meet rehab goals: Yes *See Care Plan and progress notes for long and short-term goals.    Barriers to Discharge:  cognition, see prior      Possible Resolutions to Barriers:    see prior, family ed      Discharge Planning/Teaching Needs:    home with son and daughter providing 24/7 assistance      Team Discussion:    Sensitivity to narcotics along with premorbid dementia appear to be affected progress with therapies.  Team feels first couple of days on CIR were her best and since has declined cognitively and in physical endurance.  Decreased tolerance for activities. Pt cites pain as her primary barrier, however, MD must use pain meds sparingly because of their affect on her cogn/ phys functioning.  May need to downgrade goals.  MD to address pain carefully.  SW will follow up with pt and family to determine if d/c home is still appropriate as family will likely have to provide more assistance than initially thought.   Revisions to Treatment Plan:    Possible downgrading of some goals and change of d/c plan    Continued Need for Acute Rehabilitation Level of Care: The patient requires daily medical management by a physician with specialized training in physical medicine and rehabilitation for the following conditions: Daily direction of a multidisciplinary physical rehabilitation program to ensure safe treatment while eliciting the  highest outcome that is of practical value to the patient.: Yes Daily medical management of patient stability for increased activity during participation in an intensive rehabilitation regime.: Yes Daily analysis of laboratory values and/or  radiology reports with any subsequent need for medication adjustment of medical intervention for : Post surgical problems;Neurological problems;Other  Lennart Pall 07/31/2013, 6:42 AM         Lennart Pall, LCSW Social Worker Signed  Patient Care Conference Service date: 07/24/2013 7:28 AM  Inpatient RehabilitationTeam Conference and Plan of Care Update Date: 07/23/2013   Time: 3:30 PM     Patient Name: Kambra Beachem       Medical Record Number: [id number]   Date of Birth: [birth year] Sex: Female         Room/Bed: 4M11C/4M11C-01 Payor Info: Payor: MEDICARE / Plan: MEDICARE PART A AND B / Product Type: *No Product type* /   Admitting Diagnosis: T11 sp cord decomp   Admit Date/Time:  07/19/2013  5:07 PM Admission Comments: No comment available   Primary Diagnosis:  <principal problem not specified> Principal Problem: <principal problem not specified>    Patient Active Problem List     Diagnosis  Date Noted   .  Compression of spinal cord with myelopathy  07/19/2013   .  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  Bacteremia due to Staphylococcus aureus  07/28/2012   .  HCAP (healthcare-associated pneumonia)  07/27/2012   .  ESRD on dialysis  07/27/2012   .  Chest pain  10/29/2011   .  Hypertensive emergency  10/29/2011   .  Bell's palsy  10/29/2011   .  Hyperthyroidism  10/29/2011   .  Pulmonary edema  06/05/2011   .  CAD (coronary artery disease) of bypass graft  06/05/2011   .  Peripheral neuropathy  06/05/2011   .  Diabetes mellitus  06/05/2011   .  Hypertension  06/05/2011   .  COPD (chronic obstructive pulmonary disease)  06/05/2011   .  Atherosclerosis of native arteries of the extremities with ulceration  05/25/2011     Expected Discharge Date:  Expected Discharge Date: 08/08/13  Team Members Present: Physician leading conference: Dr. Alger Simons Social Worker Present: Lennart Pall, LCSW Nurse Present: Elliot Cousin, RN PT Present: Melene Plan, Cottie Banda, PT OT Present: Roanna Epley, COTA;Kayla Perkinson, OT;Jennifer Tamala Julian, OT SLP Present: Weston Anna, SLP PPS Coordinator present : Daiva Nakayama, RN, CRRN;Becky Alwyn Ren, PT        Current Status/Progress  Goal  Weekly Team Focus   Medical     T11 DESTRUCTIVE LESION (?OSTEO) WITH MYELOPATHY  IMPROVE FUNCTIONAL MOBILITY  PAIN CONTROL, nmr   Bowel/Bladder     Continent of bowel and bladder. LBM 6/11/5  Pt to remain continent.  Monitor for regular bowel movement q 2-3 days   Swallow/Nutrition/ Hydration            ADL's     bathing/dressing-mod A; transfers-mod A/max A; decreased activity tolerance/BUE strength  bathing/dressing-supervision; toilet transfer-suprevision; tub transfer-min A  activity tolerance, transfers, UE strength, safety awareness   Mobility     Close(S)-min guard A for sitting balance; Max A for bed mobility and w/c propulsion, Mod A for squat pivot transfers and ambulation x10'i n // bars  Supervision-Min A, combination of w/c and ambulation  functional endurance, B LE strength, safety during functional mobility, transfers, bed mobility, ambulation, pt education   Communication            Safety/Cognition/ Behavioral Observations           Pain     No c/o pain  <3  Monitor for nonverbal cues    Skin     Hemodialysis cath LUE ++,  Honeycomb dressing to surgical incision, unremarkable,   No additional skin breakdown  Routine turn q 2hrs    Rehab Goals Patient on target to meet rehab goals: Yes *See Care Plan and progress notes for long and short-term goals.    Barriers to Discharge:  NEURO DEFICITS, PAIN      Possible Resolutions to Barriers:    NMR, ADAPTIVE EQUIPMENT, SCI EDUCATION      Discharge Planning/Teaching Needs:    home with  son and daughter providing 24/7 assistance      Team Discussion:    Pt very motivated, but extremely deconditioned and weak.  Anticipate need for longer LOS to meet supervision/ min assist goals.  No concerns at this time.   Revisions to Treatment Plan:    None    Continued Need for Acute Rehabilitation Level of Care: The patient requires daily medical management by a physician with specialized training in physical medicine and rehabilitation for the following conditions: Daily direction of a multidisciplinary physical rehabilitation program to ensure safe treatment while eliciting the highest outcome that is of practical value to the patient.: Yes Daily medical management of patient stability for increased activity during participation in an intensive rehabilitation regime.: Yes Daily analysis of laboratory values and/or radiology reports with any subsequent need for medication adjustment of medical intervention for : Neurological problems;Post surgical problems  Taiwo Fish 07/24/2013, 7:28 AM

## 2013-08-09 ENCOUNTER — Encounter (HOSPITAL_COMMUNITY): Payer: Self-pay | Admitting: Radiology

## 2013-08-09 ENCOUNTER — Encounter (HOSPITAL_COMMUNITY): Payer: Medicare Other

## 2013-08-09 ENCOUNTER — Inpatient Hospital Stay (HOSPITAL_COMMUNITY): Payer: Medicare Other

## 2013-08-09 ENCOUNTER — Inpatient Hospital Stay (HOSPITAL_COMMUNITY): Payer: Medicare Other | Admitting: Physical Therapy

## 2013-08-09 LAB — GLUCOSE, CAPILLARY
Glucose-Capillary: 148 mg/dL — ABNORMAL HIGH (ref 70–99)
Glucose-Capillary: 113 mg/dL — ABNORMAL HIGH (ref 70–99)
Glucose-Capillary: 241 mg/dL — ABNORMAL HIGH (ref 70–99)
Glucose-Capillary: 148 mg/dL — ABNORMAL HIGH (ref 70–99)

## 2013-08-09 MED ORDER — SODIUM CHLORIDE 0.9 % IV SOLN
125.0000 mg | INTRAVENOUS | Status: DC
  Administered 2013-08-10 – 2013-08-15 (×3): 125 mg via INTRAVENOUS
  Filled 2013-08-09 (×6): qty 10

## 2013-08-09 NOTE — Progress Notes (Signed)
Subjective:  Upset about having to have MRI.  "I'm claustraphobic".   Objective: Vital signs in last 24 hours: Temp:  [97.9 F (36.6 C)-99 F (37.2 C)] 99 F (37.2 C) (07/03 0612) Pulse Rate:  [68-100] 92 (07/03 0612) Resp:  [18-20] 19 (07/03 0612) BP: (78-179)/(46-72) 179/72 mmHg (07/03 0612) SpO2:  [99 %-100 %] 99 % (07/03 0612) Weight:  [67.8 kg (149 lb 7.6 oz)-69.7 kg (153 lb 10.6 oz)] 69.7 kg (153 lb 10.6 oz) (07/03 0612) Weight change: -2.7 kg (-5 lb 15.2 oz)  Intake/Output from previous day: 07/02 0701 - 07/03 0700 In: 240 [P.O.:240] Out: 1104 [Stool:1] Intake/Output this shift:    Lab Results:  Recent Labs  08/06/13 1430  WBC 7.6  HGB 8.6*  HCT 26.5*  PLT 364   BMET:   Recent Labs  08/06/13 1430  NA 133*  K 3.5*  CL 88*  CO2 25  GLUCOSE 178*  BUN 37*  CREATININE 6.07*  CALCIUM 8.8  ALBUMIN 2.9*   No results found for this basename: PTH,  in the last 72 hours Iron Studies: No results found for this basename: IRON, TIBC, TRANSFERRIN, FERRITIN,  in the last 72 hours  Studies/Results: No results found.  EXAM:  Gen: Alert, in no apparent distress Resp: CTA without rales, rhonchi, or wheezes  Cardio: RRR without murmur or rub  GI: + BS, soft, nontender Ext: No edema  Access: AVF @ LUA with + bruit   HD: [facility]  4h  2/2.25 Bath   73kg   LUA AVF    Heparin 4500  Aranesp 25 mcg q 4 weeks, last 6/9 Hectorol 4 ug TIW  Tsat 26%, ferr 1119, pth 286  Assessment/Plan:  1. 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 Debility- on rehab, may have reached max benefit, possible SNF d/c 3. ESRD on HD 4. HTN/Volume - no excess volume and BP dropping w UF attempts, on norvasc and BB 5. Anemia cont aranesp 100 q week 6. 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  7. Nutrition - Alb 2.9, renal carb-mod diet, vitamin.   8. CAD - 7 stents, CABG, on Ranexa.  9. DM - insulin per primary  Plan-- HD tomorrow, no UF.  She says she thinks she could do the MRI but she may need to have a premedication ("they usually give me something to calm me down")  [name] MD (pgr) [phone]    ([phone] 08/09/2013, 10:45 AM

## 2013-08-09 NOTE — Progress Notes (Signed)
Patient returned to the [facility] without MRI being completed, she stated "I am in too much pain to do this, lying on my back hurts to much". Patient had denied pain before she was picked up to go to MRI.

## 2013-08-09 NOTE — Progress Notes (Signed)
Occupational Therapy Note  Patient Details  Name: Holly Ellison MRN: [id number] Date of Birth: 02/25/1940 Today's Date: 08/09/2013  Time: 1345-1400 (pt missed 30 mins-refused to actively participate) Pt initially denied pain but c/o increased pain with bed mobility; RN aware and repositioned Individual Therapy  Pt resting in bed with son at bedside.  Son left room at beginning of session.  This therapist brought a strawberry parfait into room for patient to eat and encouraged patient to sit EOB to eat.  Pt was excited about parfait but repeatedly refused to sit EOB stating that it hurt her back too much.  Educated patient on the importance of sitting upright and increased activity.  Pt verbalized understanding but continued to refuse.  Engaged patient in bed mobility (rolling right and left) to position pad to facilitate repositioning in bed.  Pt sat ate parfait with HOB elevated.  Pt "promised" that she would get OOB on Monday morning.   Leotis Shames [facility] 08/09/2013, 2:09 PM

## 2013-08-09 NOTE — Progress Notes (Signed)
Occupational Therapy Session Note  Patient Details  Name: Holly Ellison MRN: [id number] Date of Birth: 1940/02/18  Today's Date: 08/09/2013 Time: 1100-1116 Time Calculation (min): 16 min  Short Term Goals: Week 3:  OT Short Term Goal 1 (Week 3): Pt will consistently transfer to [facility] with mod A OT Short Term Goal 2 (Week 3): Pt will perform LB bathing with mod A using AE prn OT Short Term Goal 3 (Week 3): Pt will consistently stand with mod A to perform LB bathing  Skilled Therapeutic Interventions/Progress Updates:    Pt resting in bed upon arrival.  Pt stated she was upset because "they took me down for an MRI and didn't tell me they were going to do it." Pt states that she is claustrophobic and requires sedation.  Pt stated she was getting a migraine now.  Encouraged patient to sit EOB and engage in bathing and dressing tasks.  Pt declined but assisted with repositioning in bed and engaged in rolling side to side.  Pt stated that she didn't want to do anything.  Educated patient on importance of participating and benefits of activity.  Pt verbalized understanding but continued to decline participation.    Therapy Documentation Precautions:  Precautions Precautions: Back;Fall Precaution Comments: Fistula L arm Required Braces or Orthoses: Spinal Brace Restrictions Weight Bearing Restrictions: No General: General Amount of Missed OT Time (min): 44 Minutes Missed Time Reason: Patient unwilling/refused to participate without medical reason Vital Signs:   Pain:  Pt c/o unrated pain in back; RN aware and repositioned  See FIM for current functional status  Therapy/Group: Individual Therapy  Leroy Libman 08/09/2013, 11:34 AM

## 2013-08-09 NOTE — Progress Notes (Signed)
Physical Therapy Note  Patient Details  Name: Holly Ellison MRN: [id number] Date of Birth: 17-Mar-1940 Today's Date: 08/09/2013  Time: 3754 Pt refused PT session due to "I'm watching TV"   [name] 08/09/2013, 11:41 AM

## 2013-08-09 NOTE — Progress Notes (Signed)
Physical Therapy Note  Patient Details  Name: Holly Ellison MRN: [id number] Date of Birth: [birth year] Today's Date: 08/09/2013  Time: 830-840 10 minutes  1:1 Pt with no c/o pain, c/o being "tired".  Pt refused OOB activity and PT treatment due to being "too tired".  Pt educated on importance of OOB and physical activity, pt continued to refuse despite education and encouragement.  Missed 50 minutes skilled PT.   Brittiney Dicostanzo 08/09/2013, 9:27 AM

## 2013-08-10 ENCOUNTER — Inpatient Hospital Stay (HOSPITAL_COMMUNITY): Payer: Medicare Other | Admitting: *Deleted

## 2013-08-10 DIAGNOSIS — E1149 Type 2 diabetes mellitus with other diabetic neurological complication: Secondary | ICD-10-CM

## 2013-08-10 DIAGNOSIS — E1142 Type 2 diabetes mellitus with diabetic polyneuropathy: Secondary | ICD-10-CM

## 2013-08-10 DIAGNOSIS — I150 Renovascular hypertension: Secondary | ICD-10-CM

## 2013-08-10 LAB — RENAL FUNCTION PANEL
ANION GAP: 17 — AB (ref 5–15)
Albumin: 3 g/dL — ABNORMAL LOW (ref 3.5–5.2)
BUN: 22 mg/dL (ref 6–23)
CHLORIDE: 90 meq/L — AB (ref 96–112)
CO2: 26 meq/L (ref 19–32)
CREATININE: 5.14 mg/dL — AB (ref 0.50–1.10)
Calcium: 9.2 mg/dL (ref 8.4–10.5)
GFR calc Af Amer: 9 mL/min — ABNORMAL LOW (ref >90)
GFR calc non Af Amer: 8 mL/min — ABNORMAL LOW (ref >90)
Glucose, Bld: 144 mg/dL — ABNORMAL HIGH (ref 70–99)
Phosphorus: 5.2 mg/dL — ABNORMAL HIGH (ref 2.3–4.6)
Potassium: 4.1 mEq/L (ref 3.7–5.3)
Sodium: 133 mEq/L — ABNORMAL LOW (ref 137–147)

## 2013-08-10 LAB — GLUCOSE, CAPILLARY
GLUCOSE-CAPILLARY: 86 mg/dL (ref 70–99)
Glucose-Capillary: 121 mg/dL — ABNORMAL HIGH (ref 70–99)
Glucose-Capillary: 151 mg/dL — ABNORMAL HIGH (ref 70–99)
Glucose-Capillary: 169 mg/dL — ABNORMAL HIGH (ref 70–99)

## 2013-08-10 LAB — CBC
HCT: 32.8 % — ABNORMAL LOW (ref 36.0–46.0)
HEMOGLOBIN: 10.7 g/dL — AB (ref 12.0–15.0)
MCH: 31.7 pg (ref 26.0–34.0)
MCHC: 32.6 g/dL (ref 30.0–36.0)
MCV: 97 fL (ref 78.0–100.0)
Platelets: 362 10*3/uL (ref 150–400)
RBC: 3.38 MIL/uL — AB (ref 3.87–5.11)
RDW: 16.5 % — ABNORMAL HIGH (ref 11.5–15.5)
WBC: 7.1 10*3/uL (ref 4.0–10.5)

## 2013-08-10 MED ORDER — PENTAFLUOROPROP-TETRAFLUOROETH EX AERO: 1.0000 "application " | INHALATION_SPRAY | CUTANEOUS | Status: DC | PRN

## 2013-08-10 MED ORDER — HEPARIN SODIUM (PORCINE) 1000 UNIT/ML DIALYSIS: 1000.0000 [IU] | INTRAMUSCULAR | Status: DC | PRN

## 2013-08-10 MED ORDER — LIDOCAINE HCL (PF) 1 % IJ SOLN: 5.0000 mL | INTRAMUSCULAR | Status: DC | PRN

## 2013-08-10 MED ORDER — ALTEPLASE 2 MG IJ SOLR: 2.0000 mg | Freq: Once | INTRAMUSCULAR | Status: DC | PRN

## 2013-08-10 MED ORDER — SODIUM CHLORIDE 0.9 % IV SOLN: 100.0000 mL | INTRAVENOUS | Status: DC | PRN

## 2013-08-10 MED ORDER — HEPARIN SODIUM (PORCINE) 1000 UNIT/ML DIALYSIS
1000.0000 [IU] | INTRAMUSCULAR | Status: DC | PRN
Start: 2013-08-10 — End: 2013-08-10

## 2013-08-10 MED ORDER — HEPARIN SODIUM (PORCINE) 1000 UNIT/ML DIALYSIS: 2000.0000 [IU] | INTRAMUSCULAR | Status: DC | PRN

## 2013-08-10 MED ORDER — LIDOCAINE-PRILOCAINE 2.5-2.5 % EX CREA: 1.0000 "application " | TOPICAL_CREAM | CUTANEOUS | Status: DC | PRN

## 2013-08-10 MED ORDER — NEPRO/CARBSTEADY PO LIQD: 237.0000 mL | ORAL | Status: DC | PRN

## 2013-08-10 NOTE — Procedures (Signed)
I was present at this dialysis session, have reviewed the session itself and made  appropriate changes  [name] MD (pgr) [phone]    ([phone] 08/10/2013, 4:18 PM

## 2013-08-10 NOTE — Progress Notes (Signed)
Occupational Therapy Session Note  Patient Details  Name: Holly Ellison MRN: [id number] Date of Birth: November 10, 1940  Today's Date: 08/10/2013 Time:  -    1015-1130   (75 min)    Short Term Goals: Week 1:  OT Short Term Goal 1 (Week 1): Pt. will feed self with AE prn with no assist OT Short Term Goal 1 - Progress (Week 1): Met OT Short Term Goal 2 (Week 1): Pt. will bath LB with AE with mod assist OT Short Term Goal 2 - Progress (Week 1): Progressing toward goal OT Short Term Goal 3 (Week 1): pt. will stand for LB bathing with mod assist OT Short Term Goal 3 - Progress (Week 1): Progressing toward goal OT Short Term Goal 4 (Week 1): Pt. will transfer to dropped arm cc with mod assist OT Short Term Goal 4 - Progress (Week 1): Progressing toward goal Week 2:  OT Short Term Goal 1 (Week 2): Pt will consistently transfer to [facility] with mod A OT Short Term Goal 1 - Progress (Week 2): Partly met OT Short Term Goal 2 (Week 2): Pt will stand with mod A to complete LB bathing tasks OT Short Term Goal 2 - Progress (Week 2): Progressing toward goal OT Short Term Goal 3 (Week 2): Pt will perform LB bathing with mod A using AE prn OT Short Term Goal 3 - Progress (Week 2): Progressing toward goal Week 3:  OT Short Term Goal 1 (Week 3): Pt will consistently transfer to [facility] with mod A OT Short Term Goal 2 (Week 3): Pt will perform LB bathing with mod A using AE prn OT Short Term Goal 3 (Week 3): Pt will consistently stand with mod A to perform LB bathing  Skilled Therapeutic Interventions/Progress Updates:    Pt. Lying in bed upon OT arrival.  Pt reported she smelled her body and would do whatever OT wanted her do.  Pt agreed to shower.  Prepared for shower with having pt pick out clothes and making decisions about where to dress after shower.  Pt. Transferred to wc and transferred to shower seat with mod assist.  Pt. Stated after  2 minutes of being in shower she felt tired and dizzy and could not do anything.   Pt started hanging her head over shower bar.  Called nursing for BP check.  Transferred pt to wc and propelled her to the sink.  RN took BP which was  139/56.  Pt. Able to participate again and stood at sink for OT to complete peri care.  Pt. Donned footies with sock aid after set up with no assistance.  She was pleased with her accomplishments.  Transferred to bed with minimal assist and cues to lock wc.  Left pt in bed with call bell,phone within reach.     Therapy Documentation Precautions:  Precautions Precautions: Back;Fall Precaution Comments: Fistula L arm Required Braces or Orthoses: Spinal Brace Restrictions Weight Bearing Restrictions: No General:   Vital Signs: Therapy Vitals TBP: 139/56 mmHg Patient Position (if appropriate):sittingOxygen Therapy SpO2: 94 % O2 Device: None (Room air) Pain: Pain Assessment Pain Assessment: No/denies pain Pain Score: 0-No pain Pain Type: Acute pain Pain Location: Hip Pain Orientation: Right Pain Descriptors / Indicators: Aching   Therapy/Group: Individual Therapy  Lisa Roca 08/10/2013, 2:53 PM

## 2013-08-10 NOTE — Progress Notes (Addendum)
Holly Ellison is a 73 y.o. female 11/17/1940 [id number]  Subjective:  No new complaints  Objective: Vital signs in last 24 hours: Temp:  [97.5 F (36.4 C)-98.5 F (36.9 C)] 98.5 F (36.9 C) (07/04 0643) Pulse Rate:  [65-81] 81 (07/04 0643) Resp:  [18] 18 (07/04 0643) BP: (134-166)/(48-57) 166/57 mmHg (07/04 0643) SpO2:  [96 %-99 %] 97 % (07/04 0643) Weight:  [157 lb 13.6 oz (71.6 kg)] 157 lb 13.6 oz (71.6 kg) (07/04 0643) Weight change: 8 lb 6 oz (3.8 kg) Last BM Date: 08/09/13  Intake/Output from previous day: 07/03 0701 - 07/04 0700 In: 360 [P.O.:360] Out: -  Last cbgs: CBG (last 3)   Recent Labs  08/09/13 1656 08/09/13 2139 08/10/13 0734  GLUCAP 241* 148* 121*     Physical Exam General: No apparent distress   HEENT: not dry Lungs: Normal effort. Lungs clear to auscultation, no crackles or wheezes. Cardiovascular: Regular rate and rhythm, no edema Abdomen: S/NT/ND; BS(+) Musculoskeletal:  unchanged Neurological: No new neurological deficits Wounds: N/A    Skin: clear  Aging changes Mental state: Alert, not agitated this am    Lab Results: BMET    Component Value Date/Time   NA 133* 08/06/2013 1430   K 3.5* 08/06/2013 1430   CL 88* 08/06/2013 1430   CO2 25 08/06/2013 1430   GLUCOSE 178* 08/06/2013 1430   BUN 37* 08/06/2013 1430   CREATININE 6.07* 08/06/2013 1430   CALCIUM 8.8 08/06/2013 1430   GFRNONAA 6* 08/06/2013 1430   GFRAA 7* 08/06/2013 1430   CBC    Component Value Date/Time   WBC 7.6 08/06/2013 1430   RBC 2.68* 08/06/2013 1430   RBC 2.66* 06/05/2011 0156   HGB 8.6* 08/06/2013 1430   HCT 26.5* 08/06/2013 1430   PLT 364 08/06/2013 1430   MCV 98.9 08/06/2013 1430   MCH 32.1 08/06/2013 1430   MCHC 32.5 08/06/2013 1430   RDW 16.6* 08/06/2013 1430   LYMPHSABS 1.5 07/17/2013 1110   MONOABS 0.3 07/17/2013 1110   EOSABS 0.0 07/17/2013 1110   BASOSABS 0.0 07/17/2013 1110    Studies/Results: No results found.  Medications: I have reviewed the patient's  current medications.  Assessment/Plan:    1. 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  2. DVT Prophylaxis/Anticoagulation: Mechanical: Antiembolism stockings, thigh (TED hose) Bilateral lower extremities  Sequential compression devices, below knee Bilateral lower extremities  3. Pain Management: lidoderm patches for back.  -utilize, tylenol, ice, heat--  -resumed LOW dose scheduled hydrocodone at 7am and 12 noon each day to coincide with therapy  4. H/o depression/Mood: on lexapro. LCSW to follow for evaluation and support.  5. Neuropsych: This patient is not currently capable of making decisions on her own behalf.  6. ESRD: Continue HD on TTS after therapy sessions.  7. DM type 2 with peripheral neuropathy: Continue lantus insulin with SSI for elevated BS. Inconsistent PO  -on Rx 8. CAD: Continue plavix, lipitor, lopressor and ranexa.  9. Leucocytosis: Monitor for signs of infection.  10. HTN: norvasc  10mg   - Prn po clonidine in place.  11. Acute on chronic anemia: On aranesp. Transfusion with dialysis prn.  12. AMS: baseline dementia. Periodic confusion and agitation usually associated with HD, pain, sometimes hypoglycemia  -will need to be conservative with pain measures. -will watch. Consider Seroquel if more delusional (she is not agitated this morning) 13. Abd pain complaint: no relapse  Length of stay, days: 22  Alex  Plotnikov , MD 08/10/2013, 8:29 AM

## 2013-08-11 ENCOUNTER — Inpatient Hospital Stay (HOSPITAL_COMMUNITY): Payer: Medicare Other | Admitting: *Deleted

## 2013-08-11 DIAGNOSIS — J410 Simple chronic bronchitis: Secondary | ICD-10-CM

## 2013-08-11 DIAGNOSIS — I209 Angina pectoris, unspecified: Secondary | ICD-10-CM

## 2013-08-11 LAB — GLUCOSE, CAPILLARY
Glucose-Capillary: 119 mg/dL — ABNORMAL HIGH (ref 70–99)
Glucose-Capillary: 112 mg/dL — ABNORMAL HIGH (ref 70–99)
Glucose-Capillary: 153 mg/dL — ABNORMAL HIGH (ref 70–99)
Glucose-Capillary: 166 mg/dL — ABNORMAL HIGH (ref 70–99)
Glucose-Capillary: 124 mg/dL — ABNORMAL HIGH (ref 70–99)

## 2013-08-11 NOTE — Progress Notes (Signed)
32 Family member at bedside and has concerns about patient being started on Xanax and Valium.  RN advised son to speak with MD and social worker on Monday for further guidance.

## 2013-08-11 NOTE — Progress Notes (Signed)
Holly Ellison is a 73 y.o. female 01-15-1941 [id number]  Subjective:  No new complaints  Objective: Vital signs in last 24 hours: Temp:  [98 F (36.7 C)-98.6 F (37 C)] 98 F (36.7 C) (07/05 0611) Pulse Rate:  [72-86] 72 (07/05 0611) Resp:  [18-20] 19 (07/05 0611) BP: (117-157)/(42-72) 137/61 mmHg (07/05 0611) SpO2:  [94 %-100 %] 100 % (07/05 0611) Weight:  [151 lb 7.3 oz (68.7 kg)-157 lb 3 oz (71.3 kg)] 157 lb 3 oz (71.3 kg) (07/05 7353) Weight change: -6 lb 6.3 oz (-2.9 kg) Last BM Date: 08/09/13  Intake/Output from previous day: 07/04 0701 - 07/05 0700 In: 360 [P.O.:360] Out: 0  Last cbgs: CBG (last 3)   Recent Labs  08/10/13 1732 08/10/13 2024 08/11/13 0728  GLUCAP 86 169* 119*     Physical Exam General: No apparent distress   HEENT: not dry Lungs: Normal effort. Lungs clear to auscultation, no crackles or wheezes. Cardiovascular: Regular rate and rhythm, no edema Abdomen: S/NT/ND; BS(+) Musculoskeletal:  unchanged Neurological: No new neurological deficits Wounds: N/A    Skin: clear  Aging changes Mental state: Asleep, not agitated this am    Lab Results: BMET    Component Value Date/Time   NA 133* 08/10/2013 1316   K 4.1 08/10/2013 1316   CL 90* 08/10/2013 1316   CO2 26 08/10/2013 1316   GLUCOSE 144* 08/10/2013 1316   BUN 22 08/10/2013 1316   CREATININE 5.14* 08/10/2013 1316   CALCIUM 9.2 08/10/2013 1316   GFRNONAA 8* 08/10/2013 1316   GFRAA 9* 08/10/2013 1316   CBC    Component Value Date/Time   WBC 7.1 08/10/2013 1316   RBC 3.38* 08/10/2013 1316   RBC 2.66* 06/05/2011 0156   HGB 10.7* 08/10/2013 1316   HCT 32.8* 08/10/2013 1316   PLT 362 08/10/2013 1316   MCV 97.0 08/10/2013 1316   MCH 31.7 08/10/2013 1316   MCHC 32.6 08/10/2013 1316   RDW 16.5* 08/10/2013 1316   LYMPHSABS 1.5 07/17/2013 1110   MONOABS 0.3 07/17/2013 1110   EOSABS 0.0 07/17/2013 1110   BASOSABS 0.0 07/17/2013 1110    Studies/Results: No results found.  Medications: I have reviewed the patient's  current medications.  Assessment/Plan:    1. 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  2. DVT Prophylaxis/Anticoagulation: Mechanical: Antiembolism stockings, thigh (TED hose) Bilateral lower extremities  Sequential compression devices, below knee Bilateral lower extremities  3. Pain Management: lidoderm patches for back.  -utilize, tylenol, ice, heat--  -resumed LOW dose scheduled hydrocodone at 7am and 12 noon each day to coincide with therapy  4. H/o depression/Mood: on lexapro. LCSW to follow for evaluation and support.  5. Neuropsych: This patient is not currently capable of making decisions on her own behalf.  6. ESRD: Continue HD on TTS after therapy sessions.  7. DM type 2 with peripheral neuropathy: Continue lantus insulin with SSI for elevated BS. Inconsistent PO  -on Rx 8. CAD: Continue plavix, lipitor, lopressor and ranexa.  9. Leucocytosis: Monitor for signs of infection.  10. HTN: norvasc  10mg   - Prn po clonidine in place.  11. Acute on chronic anemia: On aranesp. Transfusion with dialysis prn.  12. AMS: baseline dementia. Periodic confusion and agitation usually associated with HD, pain, sometimes hypoglycemia  -will need to be conservative with pain measures. -will watch. Consider Seroquel if more delusional (she is not agitated this morning) 13. Abd pain complaint: no relapse  Cont current  Rx  Length of stay, days: 23  Walker Kehr , MD 08/11/2013, 9:06 AM

## 2013-08-11 NOTE — Progress Notes (Signed)
1300 Pt. Calls RN to room and is very tearful, paranoid and anxious.  Patient repeatedly tells RN "they won't leave me alone!" and appears very fearful.  Antianxiety medication and emotional support given to patient.

## 2013-08-11 NOTE — Progress Notes (Signed)
Occupational Therapy Session Note  Patient Details  Name: Holly Ellison MRN: [id number] Date of Birth: [birth year]  Today's Date: 08/11/2013 Time:  -   1630-1645  (15 min)    Pt missed 15 minutes due to no reason.    Short Term Goals: Week 1:  OT Short Term Goal 1 (Week 1): Pt. will feed self with AE prn with no assist OT Short Term Goal 1 - Progress (Week 1): Met OT Short Term Goal 2 (Week 1): Pt. will bath LB with AE with mod assist OT Short Term Goal 2 - Progress (Week 1): Progressing toward goal OT Short Term Goal 3 (Week 1): pt. will stand for LB bathing with mod assist OT Short Term Goal 3 - Progress (Week 1): Progressing toward goal OT Short Term Goal 4 (Week 1): Pt. will transfer to dropped arm cc with mod assist OT Short Term Goal 4 - Progress (Week 1): Progressing toward goal Week 2:  OT Short Term Goal 1 (Week 2): Pt will consistently transfer to [facility] with mod A OT Short Term Goal 1 - Progress (Week 2): Partly met OT Short Term Goal 2 (Week 2): Pt will stand with mod A to complete LB bathing tasks OT Short Term Goal 2 - Progress (Week 2): Progressing toward goal OT Short Term Goal 3 (Week 2): Pt will perform LB bathing with mod A using AE prn OT Short Term Goal 3 - Progress (Week 2): Progressing toward goal Week 3:  OT Short Term Goal 1 (Week 3): Pt will consistently transfer to [facility] with mod A OT Short Term Goal 2 (Week 3): Pt will perform LB bathing with mod A using AE prn OT Short Term Goal 3 (Week 3): Pt will consistently stand with mod A to perform LB bathing  Skilled Therapeutic Interventions/Progress Updates:    Pt. Lying in bed upon OT arrival.  Addressed pt's recall of previous session with OT on Sat.  Pt able to remember with max cues.  Son present.  Explained to son about pt getting a shower on Saturday and did well but had to get her out because she was feeling fatigue.  Explained to pt what her goals were and purpose of rehab.  Dinner tray arrived.  Pt refused to Get  out of bed to eat dinner.  She did recall that she promised Tom she would do something on Monday.   Pt. Declined to do anymore at this point.    Therapy Documentation Precautions:  Precautions Precautions: Back;Fall Precaution Comments: Fistula L arm Required Braces or Orthoses: Spinal Brace Restrictions Weight Bearing Restrictions: No :     Pain:  3/10 back           See FIM for current functional status  Therapy/Group: Individual Therapy  Lisa Roca 08/11/2013, 5:03 PM

## 2013-08-12 ENCOUNTER — Encounter (HOSPITAL_COMMUNITY): Payer: Medicare Other

## 2013-08-12 ENCOUNTER — Inpatient Hospital Stay (HOSPITAL_COMMUNITY): Payer: Medicare Other | Admitting: Occupational Therapy

## 2013-08-12 ENCOUNTER — Inpatient Hospital Stay (HOSPITAL_COMMUNITY): Payer: Medicare Other | Admitting: Physical Therapy

## 2013-08-12 DIAGNOSIS — G8220 Paraplegia, unspecified: Secondary | ICD-10-CM

## 2013-08-12 LAB — GLUCOSE, CAPILLARY
Glucose-Capillary: 83 mg/dL (ref 70–99)
Glucose-Capillary: 114 mg/dL — ABNORMAL HIGH (ref 70–99)
Glucose-Capillary: 165 mg/dL — ABNORMAL HIGH (ref 70–99)
Glucose-Capillary: 85 mg/dL (ref 70–99)

## 2013-08-12 NOTE — Progress Notes (Signed)
Physical Therapy Weekly Progress Note  Patient Details  Name: Holly Ellison MRN: [id number] Date of Birth: [birth year]  Beginning of progress report period: August 02, 2013 End of progress report period: August 12, 2013  Today's Date: 08/12/2013 Time: 0802-0855 and 1440-1510 Time Calculation (min): 53 min and 30 min  Patient has met 2 of 9 long term goals but continues to fluctuate in performance due to frequent refusal to participate. Pt continues to make very slow and inconsistent gains due to decreased active participation in therapies due to pain vs. behavior despite max encouragement. Medical staff aware and addressing pt's pain for improved participation. Pt currently req min A-(S) for bed mobility, min A for gait using RW up to 30 ft with w/c follow for safety, and min-mod A for squat pivot transfers, sit <> stand transfers, and w/c propulsion less than 50 ft. Pt fearful of all movement and demonstrates decreased problem solving for sequencing functional mobility.   Patient continues to demonstrate the following deficits: decreased OOB tolerance, decreased functional endurance, decreased sustained attention, decreased problem solving, decreased sequencing, decreased sitting and standing balance, decreased global strength, decreased gross coordination, decreased gross motor control, decreased intellectual/emergent awareness, pain, decreased overall functional mobility and therefore will continue to benefit from skilled PT intervention to enhance overall performance with activity tolerance, balance, postural control,ability to compensate for deficits, functional use of right upper extremity, right lower extremity, left upper extremity and left lower extremity, awareness, coordination and knowledge of precautions.  See Patient's Care Plan for progression toward long term goals.  Patient not progressing toward long term goals.  See goal revision for LTGs downgraded from last weekly progress report. D/c  planned to SNF.   Skilled Therapeutic Interventions/Progress Updates:   AM Session: Pt received asleep in bed, easily aroused with multimodal cues. Pt stating, "I'll get up in a minute," and that she wants to do therapy. Pt unwilling to make eye contact or speak to therapist x 15 min. With max encouragement, pt agreeable to getting up in w/c to eat breakfast. Pt transferred supine > sit with HOB slightly elevated and bed rail with min A. Attempted stand pivot transfer using RW, pt shouting out, "My leg!" and sitting back down. Suspect decreased proprioception in LE. Pt performed squat pivot transfer bed > w/c with mod A, max vc's for sequencing and technique. Pt set up with breakfast tray sitting in w/c, requires mod encouragement to continue eating. Pt with c/o headache and asking if she took pills this morning. RN notified and present for medication administration. Pt left sitting up in w/c with RN present.    PM Session: Pt received supine in bed, agreeable to therapy with max coaxing. With increased time to participate, patient transferred supine > sit with supervision and squat pivot transfer bed > w/c with mod A, max cues for sequencing and technique. Pt propelled w/c 40 ft using BUEs with min A for steering/propulsion. In hallway, gait training using RW x 30 ft with min A, w/c follow for safety and requiring max encouragement to continue ambulation. Pt extremely proud of accomplishment despite initially refusing to walk. Pt performed sit <> stand from w/c using RW x 5 with S-min A, max vc's for hand placement. Pt returned to room and agreeable to sitting up in w/c with all needs within reach.    Therapy Documentation Precautions:  Precautions Precautions: Back;Fall Precaution Comments: Fistula L arm Required Braces or Orthoses: Spinal Brace Restrictions Weight Bearing Restrictions: No General: Amount of Missed  PT Time (min): 15 Minutes Missed Time Reason: Patient unwilling/refused to  participate without medical reason Vital Signs: Therapy Vitals Temp: 98.1 F (36.7 C) Temp src: Oral Pulse Rate: 76 Resp: 18 BP: 153/63 mmHg Patient Position (if appropriate): Lying Oxygen Therapy SpO2: 100 % O2 Device: None (Room air) Pain: Pain Assessment Pain Assessment: 0-10 Pain Score: 4  Pain Type: Surgical pain Pain Location: Back Pain Orientation: Mid;Lower Pain Descriptors / Indicators: Aching Pain Onset: On-going Patients Stated Pain Goal: 0 Pain Intervention(s): Medication (See eMAR);Heat applied  See FIM for current functional status  Therapy/Group: Individual Therapy  Laretta Alstrom 08/12/2013, 8:55 AM

## 2013-08-12 NOTE — Progress Notes (Signed)
Occupational Therapy Session Note  Patient Details  Name: Holly Ellison MRN: [id number] Date of Birth: 1940-08-21  Today's Date: 08/12/2013 Time: 1331-1401 Time Calculation (min): 30 min  Skilled Therapeutic Interventions/Progress Updates:    Pt performed dressing during the session sit to stand at the EOB.  She reluctantly finally agreed to sit EOB.  She needed mod assist to transition to sitting.  Pt needed mod assist for UB dressing with max assist for donning TLSO.  On 3 occasions during session pt became tearful stating "I can't!", but she was easily re-directed.  She also went down onto her left side as well to get relief from pain during sitting.  Therapist provided encouragement for her to continue working.  Pt stood with mod assist X 3 during session in order to remove old brief, wash her peri area, and donn new brief and pants.  Increased trunk flexion noted in standing.  Pt also utilized the reacher to threading her LEs in the correct leg holes with min assist.  Returned to supine at end of session to allow for pt to rest before PT session 30 mins later.      Therapy Documentation Precautions:  Precautions Precautions: Back;Fall Precaution Comments: Fistula L arm Required Braces or Orthoses: Spinal Brace Restrictions Weight Bearing Restrictions: No  Pain: Pain Assessment Pain Assessment: Faces Faces Pain Scale: Hurts little more Pain Type: Acute pain Pain Orientation: Anterior Pain Intervention(s): Ambulation/increased activity;Emotional support ADL: See FIM for current functional status  Therapy/Group: Individual Therapy  Quincy Prisco OTR/L 08/12/2013, 4:10 PM

## 2013-08-12 NOTE — Progress Notes (Signed)
Occupational Therapy Session Note  Patient Details  Name: Holly Ellison MRN: [id number] Date of Birth: 14-May-1940  Today's Date: 08/12/2013 Time: 1000-1033 Time Calculation (min): 33 min  Short Term Goals: Week 3:  OT Short Term Goal 1 (Week 3): Pt will consistently transfer to [facility] with mod A OT Short Term Goal 2 (Week 3): Pt will perform LB bathing with mod A using AE prn OT Short Term Goal 3 (Week 3): Pt will consistently stand with mod A to perform LB bathing  Skilled Therapeutic Interventions/Progress Updates:    Pt laying on side in bed with heat applied to back.  Pt stated that she was starting to get a migraine.  Pt engaged in bed mobility but c/o increased pain in head and stated that she just needed to lay still for awhile. Encouraged patient to continue to participate but she was reluctant. Educated patient on importance of activity and patient verbalized understanding but unwilling to continue with therapy.  Patient promised that she would get dressed during afternoon OT session. Focus on active participation, activity tolerance, and bed mobility.  Therapy Documentation Precautions:  Precautions Precautions: Back;Fall Precaution Comments: Fistula L arm Required Braces or Orthoses: Spinal Brace Restrictions Weight Bearing Restrictions: No General: General Amount of Missed OT Time (min): 27 Minutes migraine   Pain: Pain Assessment Pain Assessment: 0-10 Pain Score: 7  Pain Type: Acute pain Pain Location: Head Pain Orientation: Anterior Pain Descriptors / Indicators: Headache Pain Onset: On-going Pain Intervention(s): RN made aware  See FIM for current functional status  Therapy/Group: Individual Therapy  Leroy Libman 08/12/2013, 10:40 AM

## 2013-08-12 NOTE — Progress Notes (Signed)
Assessment/Plan:  1. 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 Debility- on rehab 3. ESRD For HD TTS 4. HTN/Volume - no excess volume and BP dropping w UF attempts, on norvasc and BB 5. Anemia cont aranesp 100 q week 6. 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  7. Nutrition - Alb 2.9, renal carb-mod diet, vitamin.  8. CAD - 7 stents, CABG, on Ranexa.  9. DM - insulin per primary Plan-- HD tomorrow  Subjective: Interval History: c/o some hallucinosis  Objective: Vital signs in last 24 hours: Temp:  [98.1 F (36.7 C)-99.2 F (37.3 C)] 98.1 F (36.7 C) (07/06 0640) Pulse Rate:  [76-81] 76 (07/06 0640) Resp:  [18] 18 (07/06 0640) BP: (140-153)/(43-63) 153/63 mmHg (07/06 0640) SpO2:  [97 %-100 %] 100 % (07/06 0640) Weight:  [73.3 kg (161 lb 9.6 oz)] 73.3 kg (161 lb 9.6 oz) (07/06 0640) Weight change: 4.6 kg (10 lb 2.3 oz)  Intake/Output from previous day: 07/05 0701 - 07/06 0700 In: 160 [P.O.:160] Out: -  Intake/Output this shift: Total I/O In: 120 [P.O.:120] Out: -   General appearance: alert and cooperative Resp: clear to auscultation bilaterally Cardio: regular rate and rhythm, S1, S2 normal, no murmur, click, rub or gallop Extremities: extremities normal, atraumatic, no cyanosis or edema  LUE AV access  Lab Results:  Recent Labs  08/10/13 1316  WBC 7.1  HGB 10.7*  HCT 32.8*  PLT 362   BMET:  Recent Labs  08/10/13 1316  NA 133*  K 4.1  CL 90*  CO2 26  GLUCOSE 144*  BUN 22  CREATININE 5.14*  CALCIUM 9.2   No results found for this basename: PTH,  in the last 72 hours Iron Studies: No results found for this basename: IRON, TIBC, TRANSFERRIN, FERRITIN,  in the last 72 hours Studies/Results: No results found.  Scheduled: . amLODipine  5 mg Oral Daily  . aspirin EC  81 mg Oral Daily  . atorvastatin  40 mg  Oral q1800  . bisacodyl  10 mg Rectal Q0600  . clopidogrel  75 mg Oral Daily  . darbepoetin (ARANESP) injection - DIALYSIS  100 mcg Intravenous Q Thu-HD  . docusate sodium  100 mg Oral BID  . feeding supplement (RESOURCE BREEZE)  1 Container Oral TID BM  . ferric gluconate (FERRLECIT/NULECIT) IV  125 mg Intravenous Q T,Th,Sa-HD  . HYDROcodone-acetaminophen  1 tablet Oral BID  . insulin aspart  0-9 Units Subcutaneous TID WC  . insulin glargine  10 Units Subcutaneous Daily  . lidocaine  2 patch Transdermal Q24H  . methimazole  10 mg Oral Daily  . metoprolol  50 mg Oral BID  . multivitamin  1 tablet Oral QHS  . pantoprazole  40 mg Oral Daily  . ranolazine  500 mg Oral BID  . topiramate  25 mg Oral Daily  . traZODone  50 mg Oral QHS     LOS: 24 days   Emmajo Bennette C 08/12/2013,12:58 PM

## 2013-08-12 NOTE — Progress Notes (Signed)
Patient ID: Holly Ellison, female   DOB: 04-06-1940, 73 y.o.   MRN: [id number] 73 y.o. female with history of DM type 2 with neuropathy, CAD, COPD, ESRD, HA, who was admitted on 07/16/13 pm with one week history of BLE, fecal incontinence and severe back pain. 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 Vertell Limber. Frozen section appeared benign and NS questions old or indolent osteomyelitis. Patient with history of MSSA bacteremia 07/2012 s/p 4 weeks of antibiotic therapy. CT chest without evidence of malignancy. HD ongoing and labs with evidence of ABLA with down to 7.1 as well as leucocytosis with WBC-16.0.    Subjective/Complaints: No new issues. Resting in bed. Back pain at times.  Review of Systems   All other systems reviewed and are negative.  Objective: Vital Signs: Blood pressure 153/63, pulse 76, temperature 98.1 F (36.7 C), temperature source Oral, resp. rate 18, height 5' 6.14" (1.68 m), weight 73.3 kg (161 lb 9.6 oz), SpO2 100.00%. No results found. Results for orders placed during the hospital encounter of 07/19/13 (from the past 72 hour(s))  GLUCOSE, CAPILLARY     Status: Abnormal   Collection Time    08/09/13 11:19 AM      Result Value Ref Range   Glucose-Capillary 113 (*) 70 - 99 mg/dL   Comment 1 Notify RN    GLUCOSE, CAPILLARY     Status: Abnormal   Collection Time    08/09/13  4:56 PM      Result Value Ref Range   Glucose-Capillary 241 (*) 70 - 99 mg/dL  GLUCOSE, CAPILLARY     Status: Abnormal   Collection Time    08/09/13  9:39 PM      Result Value Ref Range   Glucose-Capillary 148 (*) 70 - 99 mg/dL  GLUCOSE, CAPILLARY     Status: Abnormal   Collection Time    08/10/13  7:34 AM      Result Value Ref Range   Glucose-Capillary 121 (*) 70 - 99 mg/dL  GLUCOSE, CAPILLARY     Status: Abnormal   Collection Time     08/10/13 11:23 AM      Result Value Ref Range   Glucose-Capillary 151 (*) 70 - 99 mg/dL  CBC     Status: Abnormal   Collection Time    08/10/13  1:16 PM      Result Value Ref Range   WBC 7.1  4.0 - 10.5 K/uL   RBC 3.38 (*) 3.87 - 5.11 MIL/uL   Hemoglobin 10.7 (*) 12.0 - 15.0 g/dL   HCT 32.8 (*) 36.0 - 46.0 %   MCV 97.0  78.0 - 100.0 fL   MCH 31.7  26.0 - 34.0 pg   MCHC 32.6  30.0 - 36.0 g/dL   RDW 16.5 (*) 11.5 - 15.5 %   Platelets 362  150 - 400 K/uL  RENAL FUNCTION PANEL     Status: Abnormal   Collection Time    08/10/13  1:16 PM      Result Value Ref Range   Sodium 133 (*) 137 - 147 mEq/L   Potassium 4.1  3.7 - 5.3 mEq/L   Chloride 90 (*) 96 - 112 mEq/L   CO2 26  19 - 32 mEq/L   Glucose, Bld 144 (*) 70 - 99 mg/dL   BUN 22  6 - 23 mg/dL   Creatinine, Ser  5.14 (*) 0.50 - 1.10 mg/dL   Calcium 9.2  8.4 - 10.5 mg/dL   Phosphorus 5.2 (*) 2.3 - 4.6 mg/dL   Albumin 3.0 (*) 3.5 - 5.2 g/dL   GFR calc non Af Amer 8 (*) >90 mL/min   GFR calc Af Amer 9 (*) >90 mL/min   Comment: (NOTE)     The eGFR has been calculated using the CKD EPI equation.     This calculation has not been validated in all clinical situations.     eGFR's persistently <90 mL/min signify possible Chronic Kidney     Disease.   Anion gap 17 (*) 5 - 15  GLUCOSE, CAPILLARY     Status: None   Collection Time    08/10/13  5:32 PM      Result Value Ref Range   Glucose-Capillary 86  70 - 99 mg/dL  GLUCOSE, CAPILLARY     Status: Abnormal   Collection Time    08/10/13  8:24 PM      Result Value Ref Range   Glucose-Capillary 169 (*) 70 - 99 mg/dL  GLUCOSE, CAPILLARY     Status: Abnormal   Collection Time    08/11/13  7:28 AM      Result Value Ref Range   Glucose-Capillary 119 (*) 70 - 99 mg/dL  GLUCOSE, CAPILLARY     Status: Abnormal   Collection Time    08/11/13 11:16 AM      Result Value Ref Range   Glucose-Capillary 112 (*) 70 - 99 mg/dL  GLUCOSE, CAPILLARY     Status: Abnormal   Collection Time     08/11/13  2:28 PM      Result Value Ref Range   Glucose-Capillary 153 (*) 70 - 99 mg/dL  GLUCOSE, CAPILLARY     Status: Abnormal   Collection Time    08/11/13  4:44 PM      Result Value Ref Range   Glucose-Capillary 166 (*) 70 - 99 mg/dL  GLUCOSE, CAPILLARY     Status: Abnormal   Collection Time    08/11/13  8:44 PM      Result Value Ref Range   Glucose-Capillary 124 (*) 70 - 99 mg/dL  GLUCOSE, CAPILLARY     Status: None   Collection Time    08/12/13  7:14 AM      Result Value Ref Range   Glucose-Capillary 83  70 - 99 mg/dL   Comment 1 Notify RN        Nursing note and vitals reviewed.  Constitutional: She is oriented to person, place, and time. She appears well-developed and well-nourished. She is awake HENT:  Head: Normocephalic and atraumatic.  Oropharynx- poor dentition, moist and clear Eyes: Conjunctivae are normal. Pupils are equal, round, and reactive to light.  Neck: Normal range of motion. Neck supple.  Cardiovascular: Normal rate and regular rhythm.  Respiratory: Effort normal and breath sounds normal. No respiratory distress. She has no wheezes.  GI: Soft. Bowel sounds are normal. She exhibits no distension. There is no tenderness.  Musculoskeletal: She exhibits no edema, back remains tender.  Neurological: She is oriented to person, cooperative. Follows commands. Fair insight.  Bilateral hands with thenar atrophy and contracture with decrease in fine motor movements. Numbness stocking-glove distribution persists.  Skin: Skin is warm and dry.   4 minus bilateral deltoid, bicep, tricep 3 minus bilateral finger flexors and extensors  2 minus bilateral hip flexors 3 minus bilaterally knee extensors 2 minus bilateral ankle dorsiflexors and  plantar flexors Psych: oriented to person, place HD day, calm,cooperative  Assessment/Plan: 1. Functional deficits secondary to T11 lesion with paraplegia which require 3+ hours per day of interdisciplinary therapy in a comprehensive  inpatient rehab setting. Physiatrist is providing close team supervision and 24 hour management of active medical problems listed below. Physiatrist and rehab team continue to assess barriers to discharge/monitor patient progress toward functional and medical goals.    FIM: FIM - Bathing Bathing Steps Patient Completed: Chest;Right Arm;Left Arm;Abdomen;Right upper leg;Left upper leg Bathing: 3: Mod-Patient completes 5-7 [id number] parts or 50-74%  FIM - Upper Body Dressing/Undressing Upper body dressing/undressing steps patient completed: Thread/unthread left sleeve of pullover shirt/dress;Put head through opening of pull over shirt/dress;Pull shirt over trunk;Thread/unthread right sleeve of pullover shirt/dresss Upper body dressing/undressing: 0: Wears gown/pajamas-no public clothing FIM - Lower Body Dressing/Undressing Lower body dressing/undressing steps patient completed: Don/Doff left sock;Don/Doff right sock Lower body dressing/undressing: 0: Wears gown/pajamas-no public clothing  FIM - Toileting Toileting steps completed by patient: Adjust clothing prior to toileting;Performs perineal hygiene;Adjust clothing after toileting Toileting Assistive Devices: Grab bar or rail for support Toileting: 0: Activity did not occur  FIM - TRadio producerDevices: Grab bars Toilet Transfers: 0-Activity did not occur  FIM - BControl and instrumentation engineerDevices: HOB elevated;Bed rails Bed/Chair Transfer: 5: Supine > Sit: Supervision (verbal cues/safety issues);5: Sit > Supine: Supervision (verbal cues/safety issues);4: Bed > Chair or W/C: Min A (steadying Pt. > 75%);4: Chair or W/C > Bed: Min A (steadying Pt. > 75%)  FIM - Locomotion: Wheelchair Distance: 60 Locomotion: Wheelchair: 0: Activity did not occur FIM - Locomotion: Ambulation Locomotion: Ambulation Assistive Devices: Parallel bars Ambulation/Gait Assistance: 4: Min assist Locomotion:  Ambulation: 0: Activity did not occur  Comprehension Comprehension Mode: Auditory Comprehension: 5-Follows basic conversation/direction: With extra time/assistive device  Expression Expression Mode: Verbal Expression: 4-Expresses basic 75 - 89% of the time/requires cueing 10 - 24% of the time. Needs helper to occlude trach/needs to repeat words.  Social Interaction Social Interaction: 3-Interacts appropriately 50 - 74% of the time - May be physically or verbally inappropriate.  Problem Solving Problem Solving: 3-Solves basic 50 - 74% of the time/requires cueing 25 - 49% of the time  Memory Memory: 3-Recognizes or recalls 50 - 74% of the time/requires cueing 25 - 49% of the time  Medical Problem List and Plan:  1. 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  2. DVT Prophylaxis/Anticoagulation: Mechanical: Antiembolism stockings, thigh (TED hose) Bilateral lower extremities  Sequential compression devices, below knee Bilateral lower extremities  3. Pain Management: lidoderm patches for back.   -utilize, tylenol, ice, heat--   -resumed LOW dose scheduled hydrocodone at 7am and 12 noon each day to coincide with therapy History of migraines, no prophyllaxis- not relieved by tylenol, trial topamax qhs , monitor sedation 4. H/o depression/Mood: Resume lexapro. LCSW to follow for evaluation and support.  5. Neuropsych: This patient is not currently capable of making decisions on her own behalf.  6. ESRD: Continue HD on TTS after therapy sessions.  7. 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 Inconsistent PO   -fair control at present 8. CAD: Continue plavix, lipitor, lopressor and ranexa.  9. Leucocytosis: Monitor for signs of infection. WBC 11.3 10. HTN:  norvasc  531mdaily---increase to 1034m  -  Prn po clonidine in place.  11. Acute on  chronic  anemia: On aranesp. Transfusion with dialysis prn. Stable/improving 12. AMS: baseline dementia. Episodic confusion, oriented thus far today      LOS (Days) 24 A FACE TO FACE EVALUATION WAS PERFORMED  Nastasha Reising T 08/12/2013, 8:24 AM

## 2013-08-13 ENCOUNTER — Inpatient Hospital Stay (HOSPITAL_COMMUNITY): Payer: Medicare Other | Admitting: Occupational Therapy

## 2013-08-13 ENCOUNTER — Inpatient Hospital Stay (HOSPITAL_COMMUNITY): Payer: Medicare Other

## 2013-08-13 ENCOUNTER — Inpatient Hospital Stay (HOSPITAL_COMMUNITY): Payer: Medicare Other | Admitting: Physical Therapy

## 2013-08-13 LAB — GLUCOSE, CAPILLARY
GLUCOSE-CAPILLARY: 94 mg/dL (ref 70–99)
GLUCOSE-CAPILLARY: 79 mg/dL (ref 70–99)
Glucose-Capillary: 87 mg/dL (ref 70–99)

## 2013-08-13 MED ORDER — SODIUM CHLORIDE 0.9 % IV SOLN: 100.0000 mL | INTRAVENOUS | Status: DC | PRN

## 2013-08-13 MED ORDER — NEPRO/CARBSTEADY PO LIQD: 237.0000 mL | ORAL | Status: DC | PRN

## 2013-08-13 MED ORDER — HEPARIN SODIUM (PORCINE) 1000 UNIT/ML DIALYSIS
1000.0000 [IU] | INTRAMUSCULAR | Status: DC | PRN
  Filled 2013-08-13: qty 1

## 2013-08-13 MED ORDER — LIDOCAINE HCL (PF) 1 % IJ SOLN
5.0000 mL | INTRAMUSCULAR | Status: DC | PRN
  Filled 2013-08-13: qty 5

## 2013-08-13 MED ORDER — ALTEPLASE 2 MG IJ SOLR
2.0000 mg | Freq: Once | INTRAMUSCULAR | Status: AC | PRN
  Filled 2013-08-13: qty 2

## 2013-08-13 MED ORDER — PENTAFLUOROPROP-TETRAFLUOROETH EX AERO: 1.0000 "application " | INHALATION_SPRAY | CUTANEOUS | Status: DC | PRN

## 2013-08-13 MED ORDER — LIDOCAINE-PRILOCAINE 2.5-2.5 % EX CREA
1.0000 "application " | TOPICAL_CREAM | CUTANEOUS | Status: DC | PRN
  Filled 2013-08-13: qty 5

## 2013-08-13 NOTE — Progress Notes (Signed)
Physical Therapy Session Note  Patient Details  Name: Holly Ellison MRN: [id number] Date of Birth: [birth year]  Today's Date: 08/13/2013 Time: 2751-7001 Time Calculation (min): 45 min  Short Term Goals: Week 4:  PT Short Term Goal 1 (Week 4): STGs=LTGs due to anticipated d/c to SNF  Skilled Therapeutic Interventions/Progress Updates:  1:1. Pt received semi-reclined in bed, initially not speaking to therapist only mumbling or pointing to things. Focus this session on participation and safety during functional mobility. Pt eventually speaking to therapist stating, "why do you all always bother me!" Pt also revealing that she is not a morning person, will discuss w/ therapy team modification of pt's schedule to start later in AM in hope for improved participation. Pt req 75min to initiate getting OOB, w/ max encouragement and education regarding goals for therapy. Pt very proud of self regarding day yesterday, used that as motivation for participation today. Pt verbalized slightly increased motivation when given target goal of ambulating 40' this session, pt stating "ok, I can do that!" However, continued to lay in L sidelying for additional 27min. Pt req min A for t/f sup>sit EOB w/ use of hopsital bed functions and for t/f sit<>standx3 for therapist to fasten brief and SPT bed>w/c. Pt demonstrated difficulty standing >5sec while therapist fastening brief due to feeling as if her R LE was buckling. Pt only able to amb 7' w/ RW due to fatigue, req min A. Pt very fearful of R knee buckling, but no signs during gait. Therapist placing hand on R thigh for placebo effect. Pt left sitting in w/c at end of session w/ all needs in reach. RN aware.   Therapy Documentation Precautions:  Precautions Precautions: Back;Fall Precaution Comments: Fistula L arm Required Braces or Orthoses: Spinal Brace Restrictions Weight Bearing Restrictions: No  See FIM for current functional status  Therapy/Group: Individual  Therapy  Gilmore Laroche 08/13/2013, 12:11 PM

## 2013-08-13 NOTE — Plan of Care (Signed)
Following discussion from pt's care team in team conference, pt's therapy schedule decreased to QD due to continued inconsistent participation in therapy sessions due to pain and behavior despite max encouragement/education as well as medical team addressing concerns.   Planning to schedule therapy sessions later in AM or in PM for improved participation as pt reports that she is not a morning person.  Gilmore Laroche, PT, DPT

## 2013-08-13 NOTE — Progress Notes (Signed)
Occupational Therapy Session Note  Patient Details  Name: Holly Ellison MRN: [id number] Date of Birth: 1940/07/28  Today's Date: 08/13/2013 Time: 1030-1105 Time Calculation (min): 35 min  Short Term Goals: Week 3:  OT Short Term Goal 1 (Week 3): Pt will consistently transfer to [facility] with mod A OT Short Term Goal 2 (Week 3): Pt will perform LB bathing with mod A using AE prn OT Short Term Goal 3 (Week 3): Pt will consistently stand with mod A to perform LB bathing  Skilled Therapeutic Interventions/Progress Updates:    Pt seen for ADL retraining with focus on activity tolerance, functional transfers, and adherence to precautions. Pt received sitting in w/c requiring mod cues of encouragement for participation secondary to pt reporting migraine. Completed bathing at sink with max cues for adherence to precautions and mod cues for completion of tasks. Pt declining use of AE stating "I don't like it." Pt completed sit<>stand at sink x2 with min assist and mod cues for postural control secondary to increased trunk flexion. Following dressing, pt declining further participation d/t headache and requesting to return to bed. Provided education on importance of participation to increase independence, however pt continued to decline.  Completed stand pivot transfer w/c>bed with min assist then required mod assist for sit>supine. Pt left with all needs in reach.   Therapy Documentation Precautions:  Precautions Precautions: Back;Fall Precaution Comments: Fistula L arm Required Braces or Orthoses: Spinal Brace Restrictions Weight Bearing Restrictions: No General: General Amount of Missed OT Time (min): 25 Minutes Vital Signs:   Pain: 8/10 headache/migraine  See FIM for current functional status  Therapy/Group: Individual Therapy  Guneet Delpino, Quillian Quince 08/13/2013, 11:14 AM

## 2013-08-13 NOTE — Procedures (Signed)
Some decrease in BP with treatment. BP has not been high pre treatment.  Will back off on UF goal. Jansen Sciuto C

## 2013-08-13 NOTE — Progress Notes (Signed)
Patient ID: Holly Ellison, female   DOB: November 11, 1940, 73 y.o.   MRN: [id number] 73 y.o. female with history of DM type 2 with neuropathy, CAD, COPD, ESRD, HA, who was admitted on 07/16/13 pm with one week history of BLE, fecal incontinence and severe back pain. 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 Vertell Limber. Frozen section appeared benign and NS questions old or indolent osteomyelitis. Patient with history of MSSA bacteremia 07/2012 s/p 4 weeks of antibiotic therapy. CT chest without evidence of malignancy. HD ongoing and labs with evidence of ABLA with down to 7.1 as well as leucocytosis with WBC-16.0.    Subjective/Complaints: Slept comfortably. Doesn't have any complaints. Still with some intermittent confusion and anxiety.   Review of Systems  A  review of systems has been performed and if not noted above is otherwise negative.   Objective: Vital Signs: Blood pressure 121/45, pulse 73, temperature 98.4 F (36.9 C), temperature source Oral, resp. rate 17, height 5' 6.14" (1.68 m), weight 73.3 kg (161 lb 9.6 oz), SpO2 98.00%. No results found. Results for orders placed during the hospital encounter of 07/19/13 (from the past 72 hour(s))  GLUCOSE, CAPILLARY     Status: Abnormal   Collection Time    08/10/13 11:23 AM      Result Value Ref Range   Glucose-Capillary 151 (*) 70 - 99 mg/dL  CBC     Status: Abnormal   Collection Time    08/10/13  1:16 PM      Result Value Ref Range   WBC 7.1  4.0 - 10.5 K/uL   RBC 3.38 (*) 3.87 - 5.11 MIL/uL   Hemoglobin 10.7 (*) 12.0 - 15.0 g/dL   HCT 32.8 (*) 36.0 - 46.0 %   MCV 97.0  78.0 - 100.0 fL   MCH 31.7  26.0 - 34.0 pg   MCHC 32.6  30.0 - 36.0 g/dL   RDW 16.5 (*) 11.5 - 15.5 %   Platelets 362  150 - 400 K/uL  RENAL FUNCTION PANEL     Status: Abnormal   Collection Time    08/10/13   1:16 PM      Result Value Ref Range   Sodium 133 (*) 137 - 147 mEq/L   Potassium 4.1  3.7 - 5.3 mEq/L   Chloride 90 (*) 96 - 112 mEq/L   CO2 26  19 - 32 mEq/L   Glucose, Bld 144 (*) 70 - 99 mg/dL   BUN 22  6 - 23 mg/dL   Creatinine, Ser 5.14 (*) 0.50 - 1.10 mg/dL   Calcium 9.2  8.4 - 10.5 mg/dL   Phosphorus 5.2 (*) 2.3 - 4.6 mg/dL   Albumin 3.0 (*) 3.5 - 5.2 g/dL   GFR calc non Af Amer 8 (*) >90 mL/min   GFR calc Af Amer 9 (*) >90 mL/min   Comment: (NOTE)     The eGFR has been calculated using the CKD EPI equation.     This calculation has not been validated in all clinical situations.     eGFR's persistently <90 mL/min signify possible Chronic Kidney     Disease.   Anion gap 17 (*) 5 - 15  GLUCOSE, CAPILLARY     Status: None   Collection Time    08/10/13  5:32 PM      Result Value Ref Range   Glucose-Capillary 86  70 - 99 mg/dL  GLUCOSE, CAPILLARY     Status: Abnormal   Collection Time    08/10/13  8:24 PM      Result Value Ref Range   Glucose-Capillary 169 (*) 70 - 99 mg/dL  GLUCOSE, CAPILLARY     Status: Abnormal   Collection Time    08/11/13  7:28 AM      Result Value Ref Range   Glucose-Capillary 119 (*) 70 - 99 mg/dL  GLUCOSE, CAPILLARY     Status: Abnormal   Collection Time    08/11/13 11:16 AM      Result Value Ref Range   Glucose-Capillary 112 (*) 70 - 99 mg/dL  GLUCOSE, CAPILLARY     Status: Abnormal   Collection Time    08/11/13  2:28 PM      Result Value Ref Range   Glucose-Capillary 153 (*) 70 - 99 mg/dL  GLUCOSE, CAPILLARY     Status: Abnormal   Collection Time    08/11/13  4:44 PM      Result Value Ref Range   Glucose-Capillary 166 (*) 70 - 99 mg/dL  GLUCOSE, CAPILLARY     Status: Abnormal   Collection Time    08/11/13  8:44 PM      Result Value Ref Range   Glucose-Capillary 124 (*) 70 - 99 mg/dL  GLUCOSE, CAPILLARY     Status: None   Collection Time    08/12/13  7:14 AM      Result Value Ref Range   Glucose-Capillary 83  70 - 99 mg/dL    Comment 1 Notify RN    GLUCOSE, CAPILLARY     Status: Abnormal   Collection Time    08/12/13 11:16 AM      Result Value Ref Range   Glucose-Capillary 114 (*) 70 - 99 mg/dL   Comment 1 Notify RN    GLUCOSE, CAPILLARY     Status: Abnormal   Collection Time    08/12/13  4:41 PM      Result Value Ref Range   Glucose-Capillary 165 (*) 70 - 99 mg/dL  GLUCOSE, CAPILLARY     Status: None   Collection Time    08/12/13  9:17 PM      Result Value Ref Range   Glucose-Capillary 85  70 - 99 mg/dL  GLUCOSE, CAPILLARY     Status: None   Collection Time    08/13/13  7:21 AM      Result Value Ref Range   Glucose-Capillary 87  70 - 99 mg/dL   Comment 1 Notify RN        Nursing note and vitals reviewed.  Constitutional: She is oriented to person, place, and time. She appears well-developed and well-nourished. She is awake HENT:  Head: Normocephalic and atraumatic.  Oropharynx- poor dentition, moist and clear Eyes: Conjunctivae are normal. Pupils are equal, round, and reactive to light.  Neck: Normal range of motion. Neck supple.  Cardiovascular: Normal rate and regular rhythm.  Respiratory: Effort normal and breath sounds normal. No respiratory distress. She has no wheezes.  GI: Soft. Bowel sounds are normal. She exhibits no distension. There is no tenderness.  Musculoskeletal: She exhibits no edema, back remains tender.  Neurological: She is oriented to person, cooperative. Follows commands. Fair insight.  Bilateral hands with thenar atrophy and contracture with decrease in fine motor movements. Numbness stocking-glove distribution persists.  Skin: Skin is warm and dry.   4 minus bilateral deltoid, bicep, tricep 3 minus bilateral  finger flexors and extensors  2 minus bilateral hip flexors 3 minus bilaterally knee extensors 2 minus bilateral ankle dorsiflexors and plantar flexors Psych: oriented to person, place HD day, calm,cooperative  Assessment/Plan: 1. Functional deficits secondary to  T11 lesion with paraplegia which require 3+ hours per day of interdisciplinary therapy in a comprehensive inpatient rehab setting. Physiatrist is providing close team supervision and 24 hour management of active medical problems listed below. Physiatrist and rehab team continue to assess barriers to discharge/monitor patient progress toward functional and medical goals.    FIM: FIM - Bathing Bathing Steps Patient Completed: Chest;Right Arm;Left Arm;Abdomen;Right upper leg;Left upper leg Bathing: 3: Mod-Patient completes 5-7 12f10 parts or 50-74%  FIM - Upper Body Dressing/Undressing Upper body dressing/undressing steps patient completed: Thread/unthread right sleeve of pullover shirt/dresss;Thread/unthread left sleeve of pullover shirt/dress Upper body dressing/undressing: 3: Mod-Patient completed 50-74% of tasks FIM - Lower Body Dressing/Undressing Lower body dressing/undressing steps patient completed: Thread/unthread left pants leg;Thread/unthread right pants leg Lower body dressing/undressing: 2: Max-Patient completed 25-49% of tasks  FIM - Toileting Toileting steps completed by patient: Adjust clothing prior to toileting;Performs perineal hygiene;Adjust clothing after toileting Toileting Assistive Devices: Grab bar or rail for support Toileting: 0: Activity did not occur  FIM - TRadio producerDevices: Grab bars Toilet Transfers: 0-Activity did not occur  FIM - BControl and instrumentation engineerDevices: HOB elevated;Bed rails Bed/Chair Transfer: 3: Supine > Sit: Mod A (lifting assist/Pt. 50-74%/lift 2 legs;4: Chair or W/C > Bed: Min A (steadying Pt. > 75%)  FIM - Locomotion: Wheelchair Distance: 40 Locomotion: Wheelchair: 1: Travels less than 50 ft with minimal assistance (Pt.>75%) FIM - Locomotion: Ambulation Locomotion: Ambulation Assistive Devices: WAdministratorAmbulation/Gait Assistance: 4: Min assist Locomotion: Ambulation:  1: Two helpers (30 ft with w/c follow for safety)  Comprehension Comprehension Mode: Auditory Comprehension: 5-Follows basic conversation/direction: With extra time/assistive device  Expression Expression Mode: Verbal Expression: 4-Expresses basic 75 - 89% of the time/requires cueing 10 - 24% of the time. Needs helper to occlude trach/needs to repeat words.  Social Interaction Social Interaction: 3-Interacts appropriately 50 - 74% of the time - May be physically or verbally inappropriate.  Problem Solving Problem Solving: 3-Solves basic 50 - 74% of the time/requires cueing 25 - 49% of the time  Memory Memory: 3-Recognizes or recalls 50 - 74% of the time/requires cueing 25 - 49% of the time  Medical Problem List and Plan:  1. 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  2. DVT Prophylaxis/Anticoagulation: Mechanical: Antiembolism stockings, thigh (TED hose) Bilateral lower extremities  Sequential compression devices, below knee Bilateral lower extremities  3. Pain Management: lidoderm patches for back.   -utilize, tylenol, ice, heat--   -  LOW dose scheduled hydrocodone at 7am and 12 noon each day to coincide with therapy History of migraines, no prophyllaxis- not relieved by tylenol, trial topamax qhs , monitor sedation 4. H/o depression/Mood: Resume lexapro. LCSW to follow for evaluation and support.  5. Neuropsych: This patient is not currently capable of making decisions on her own behalf.  6. ESRD: Continue HD on TTS after therapy sessions.  7. 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 Inconsistent PO   -fair control at present 8. CAD: Continue plavix, lipitor, lopressor and ranexa.  9. Leucocytosis: Monitor for signs of infection. WBC 11.3 10. HTN:  norvasc  582mdaily---increase to 1033m  -  Prn po clonidine in place.  11. Acute on chronic  anemia: On aranesp. Transfusion with dialysis prn. Stable/improving 12. AMS: baseline dementia. Episodic confusion persists     LOS (Days) 25 A FACE TO FACE EVALUATION WAS PERFORMED  Dorann Davidson T 08/13/2013, 8:18 AM

## 2013-08-13 NOTE — Progress Notes (Signed)
Occupational Therapy Session Note  Patient Details  Name: Holly Ellison MRN: [id number] Date of Birth: July 21, 1940  Today's Date: 08/13/2013 Time: 1165-7903 Time Calculation (min): 15 min   Skilled Therapeutic Interventions/Progress Updates:    Pt in wheelchair and tearful as therapist approached her.  Pt's son also present and voiced concern that pt is going to SNF rehab and cannot further tolerate the amount of therapy we are trying to give her.  He stated there is no need if she is having pain and "you guys are working her like she's going home, she's not going home."  Therapist attempted to explain position with encouraging pt to participate because pt's at times do not push themselves and become complacent with staying in the bed and performing little activity.  Therapist assisted pt with stand pivot transfer back to the EOB with mod assist.  He then called in NT to assist with changing brief as she was not comfortable with this female therapist assisting her.  Conveyed to son that therapist would discuss her level of therapy with the rest of her therapists as well.    Therapy Documentation Precautions:  Precautions Precautions: Back;Fall Precaution Comments: Fistula L arm Required Braces or Orthoses: Spinal Brace Restrictions Weight Bearing Restrictions: No General: General Amount of Missed OT Time (min): 15 Minutes Vital Signs: Therapy Vitals Pulse Rate: 102 (after gait) BP: 119/83 mmHg Patient Position (if appropriate): Sitting Oxygen Therapy SpO2: 100 % O2 Device: None (Room air) Pain: Pain Assessment Pain Assessment: Faces Pain Score: 9  Faces Pain Scale: Hurts little more Pain Type: Surgical pain Pain Location: Back Pain Orientation: Lower Pain Frequency: Intermittent Pain Onset: Gradual Pain Intervention(s): Repositioned;Emotional support Multiple Pain Sites: No  See FIM for current functional status  Therapy/Group: Individual Therapy  Nataleigh Griffin  OTR/L 08/13/2013, 2:27 PM

## 2013-08-13 NOTE — Progress Notes (Signed)
Physical Therapy Session Note  Patient Details  Name: Holly Ellison MRN: [id number] Date of Birth: 30-Nov-1940  Today's Date: 08/13/2013 Time: 1125-1105 Time Calculation (min): 35 min  Short Term Goals: Week 4:  PT Short Term Goal 1 (Week 4): STGs=LTGs due to anticipated d/c to SNF  Skilled Therapeutic Interventions/Progress Updates:  W/C Management & Propulsion: PT instructs pt in w/c propulsion with B UE x 75' req verbal cues for steering straight and min A hand over hand for tight turn.   Gait Training: PT instructs pt in ambulation with RW x 5' + 15' req CGA for safety and verbal cues for upright posture and large steps. PT encouraged pt to increase gait distance, but pt reports she can't and sits prematurely, not waiting for PT to lock w/c.   Therapeutic Exercise: PT instructs pt on Nu-step at L5 x 5 minutes for B LE strengthening and increasing activity tolerance.   Therapeutic Activity: PT instructs pt in L side lie to sit req min A x 2 reps due to pt throwing body back onto bed, stating "I can't!". PT instructs pt in sit to stand with RW req min A. PT instructs pt in squat-pivot transfer bed to w/c req min A. PT instructs pt in scooting forward edge of bed and forward/back in w/c req min A.   Pt is very resistant to any form of exercise/activity/functional mobility. It takes max encouragement for pt to participate in skilled PT. SNF discharge continues to be appropriate.       Therapy Documentation Precautions:  Precautions Precautions: Back;Fall Precaution Comments: Fistula L arm Required Braces or Orthoses: Spinal Brace Restrictions Weight Bearing Restrictions: No  Pain: Pain Assessment Pain Assessment: 0-10 Pain Score: 9  Pain Type: Surgical pain Pain Location: Back Pain Orientation: Lower Pain Frequency: Intermittent Pain Onset: Gradual Pain Intervention(s): Medication (See eMAR)  See FIM for current functional status  Therapy/Group: Individual  Therapy  [name] 08/13/2013, 12:04 PM

## 2013-08-14 ENCOUNTER — Inpatient Hospital Stay (HOSPITAL_COMMUNITY): Payer: Medicare Other

## 2013-08-14 ENCOUNTER — Encounter (HOSPITAL_COMMUNITY): Payer: Medicare Other

## 2013-08-14 DIAGNOSIS — G8220 Paraplegia, unspecified: Secondary | ICD-10-CM

## 2013-08-14 LAB — GLUCOSE, CAPILLARY
GLUCOSE-CAPILLARY: 118 mg/dL — AB (ref 70–99)
Glucose-Capillary: 86 mg/dL (ref 70–99)
Glucose-Capillary: 158 mg/dL — ABNORMAL HIGH (ref 70–99)
Glucose-Capillary: 224 mg/dL — ABNORMAL HIGH (ref 70–99)

## 2013-08-14 MED ORDER — AMLODIPINE BESYLATE 5 MG PO TABS: 5.0000 mg | ORAL_TABLET | Freq: Every day | ORAL | Status: DC

## 2013-08-14 MED ORDER — METOPROLOL TARTRATE 25 MG PO TABS
25.0000 mg | ORAL_TABLET | Freq: Two times a day (BID) | ORAL | Status: DC
  Administered 2013-08-14 – 2013-08-15 (×2): 25 mg via ORAL
  Filled 2013-08-14 (×4): qty 1

## 2013-08-14 MED ORDER — DSS 100 MG PO CAPS: 100.0000 mg | ORAL_CAPSULE | Freq: Two times a day (BID) | ORAL | Status: DC

## 2013-08-14 MED ORDER — ACETAMINOPHEN 325 MG PO TABS: 325.0000 mg | ORAL_TABLET | ORAL | Status: DC | PRN

## 2013-08-14 MED ORDER — ALPRAZOLAM 0.25 MG PO TABS: 0.2500 mg | ORAL_TABLET | Freq: Three times a day (TID) | ORAL | Status: DC | PRN

## 2013-08-14 MED ORDER — HYDROCODONE-ACETAMINOPHEN 5-325 MG PO TABS: 1.0000 | ORAL_TABLET | Freq: Two times a day (BID) | ORAL | Status: DC

## 2013-08-14 MED ORDER — LANTHANUM CARBONATE 500 MG PO CHEW
1000.0000 mg | CHEWABLE_TABLET | Freq: Three times a day (TID) | ORAL | Status: DC
  Filled 2013-08-14 (×5): qty 2

## 2013-08-14 MED ORDER — LANTHANUM CARBONATE 1000 MG PO CHEW: 1000.0000 mg | CHEWABLE_TABLET | Freq: Three times a day (TID) | ORAL | Status: DC

## 2013-08-14 MED ORDER — ASPIRIN 81 MG PO TBEC: 81.0000 mg | DELAYED_RELEASE_TABLET | Freq: Every day | ORAL | Status: AC

## 2013-08-14 MED ORDER — TRAZODONE HCL 50 MG PO TABS: 50.0000 mg | ORAL_TABLET | Freq: Every day | ORAL | Status: AC

## 2013-08-14 MED ORDER — TOPIRAMATE 25 MG PO TABS: 25.0000 mg | ORAL_TABLET | Freq: Every day | ORAL | Status: DC

## 2013-08-14 MED ORDER — INSULIN GLARGINE 100 UNIT/ML ~~LOC~~ SOLN: 10.0000 [IU] | Freq: Every day | SUBCUTANEOUS | Status: AC

## 2013-08-14 MED ORDER — BISACODYL 10 MG RE SUPP: 10.0000 mg | Freq: Every day | RECTAL | Status: AC

## 2013-08-14 MED ORDER — RANOLAZINE ER 500 MG PO TB12: 500.0000 mg | ORAL_TABLET | Freq: Two times a day (BID) | ORAL | Status: DC

## 2013-08-14 MED ORDER — BOOST / RESOURCE BREEZE PO LIQD: 1.0000 | Freq: Three times a day (TID) | ORAL | Status: DC

## 2013-08-14 MED ORDER — METOPROLOL TARTRATE 25 MG PO TABS: 25.0000 mg | ORAL_TABLET | Freq: Two times a day (BID) | ORAL | Status: DC

## 2013-08-14 MED ORDER — CALCIUM CARBONATE ANTACID 750 MG PO CHEW: 1.0000 | CHEWABLE_TABLET | Freq: Three times a day (TID) | ORAL | Status: AC | PRN

## 2013-08-14 MED ORDER — LIDOCAINE 5 % EX PTCH: 2.0000 | MEDICATED_PATCH | CUTANEOUS | Status: AC

## 2013-08-14 NOTE — Progress Notes (Signed)
Occupational Therapy Discharge Summary  Patient Details  Name: Holly Ellison MRN: [id number] Date of Birth: [birth year]  Today's Date: 08/14/2013  Patient has met 6 of 10 long term goals due to improved activity tolerance, improved balance, postural control, functional use of  RIGHT upper, RIGHT lower, LEFT upper and LEFT lower extremity and improved attention.  Pt's progress has been inconsistent during this admission and LTGs were downgraded from original [place].  Pt consistently complained of pain in back and abdomen which prevented her from consistent participation in therapy sessions.  Pt requires mod A for transfers, sit<>stands, and standing balance when performing BADLs.  Pt requires BUE support when standing and is unable to assist with toileting tasks or pulling up pants and bathing buttocks.Pt uses a reacher to assist with donning pants but has refused use of sock aid.  Pt is tot A for toileting. Pt's family has not been present for therapy sessions.  Pt discharging to SNF. Patient to discharge at overall mod-total assist level.  Patient's care partner not necessary to provide the necessary physical and cognitive assistance at discharge secondary to patient discharging to SNF.      Reasons goals not met: Patient did not meet the following goals as she is limited by pain, weakness, and willingness to participate: Min assist for dynamic standing balance as patient requires max assist.  Mod assist for lower body dressing as she requires total assist (declines use of AE). Min assist for toilet transfers as she requires mod assist Max assist for toilet task as she requires total assist.   Recommendation:  Patient will benefit from ongoing skilled OT services in skilled nursing facility setting to continue to advance functional skills in the area of BADLs, safety awareness, activity tolerance, decreased strength, ability to maintain precautions, dynamic standing balance.  Equipment: No equipment  providedDischarge to SNF  Reasons for discharge: treatment goals met and discharge from hospital  Patient/family agrees with progress made and goals achieved: Yes  OT Discharge   ADL  See FIM  Vision/Perception  Vision- History Baseline Vision/History: Wears glasses Wears Glasses: At all times Patient Visual Report: Other (comment) (occasional blurring of vision) Vision- Assessment Vision Assessment?: No apparent visual deficits  Cognition Overall Cognitive Status: Impaired/Different from baseline Orientation Level: Oriented X4 Attention: Selective Selective Attention: Impaired Selective Attention Impairment: Functional basic Memory: Impaired Memory Impairment: Decreased recall of new information Awareness: Impaired Awareness Impairment: Emergent impairment Problem Solving: Impaired Problem Solving Impairment: Functional basic Safety/Judgment: Impaired Sensation Sensation Light Touch: Impaired by gross assessment Hot/Cold: Impaired by gross assessment Additional Comments: Pt has peripheral neuropathy in BLE and BUE. Decreased to light touch in B feet beginning at the ankle and distal to the toes. Absent to light touch in the R great toe.  Coordination Gross Motor Movements are Fluid and Coordinated: Yes Fine Motor Movements are Fluid and Coordinated: No    Trunk/Postural Assessment  Cervical Assessment Cervical Assessment: Within Functional Limits Thoracic Assessment Thoracic Assessment: Exceptions to [facility] (back precautions) Lumbar Assessment Lumbar Assessment: Exceptions to [facility] (back precautions)  Balance Static Sitting Balance Static Sitting - Balance Support: Feet supported;No upper extremity supported Static Sitting - Level of Assistance: 6: Modified independent (Device/Increase time) Extremity/Trunk Assessment RUE Assessment RUE Assessment: Within Functional Limits RUE Strength RUE Overall Strength: Deficits Gross Grasp: Impaired LUE Assessment LUE  Assessment: Exceptions to [facility] LUE Strength LUE Overall Strength: Deficits Gross Grasp: Impaired  See FIM for current functional status  Leroy Libman 08/14/2013, 3:58 PM

## 2013-08-14 NOTE — Progress Notes (Signed)
Social Work Patient ID: Holly Ellison, female   DOB: Dec 23, 1940, 73 y.o.   MRN: [id number]  Have received SNF bed offer today from [facility].  Pt and son aware and accepting bed.  Son plans to provide transportation for pt to HD T, Th, Sat from facility.  Have notified HD here of need to have pt on 1st shift tomorrow.  MD/ PA and tx team aware.  Plan to have pt transfer to facility in the afternoon via ambulance.  Dena Esperanza, LCSW

## 2013-08-14 NOTE — Progress Notes (Signed)
Occupational Therapy Session Note  Patient Details  Name: Narissa Beaufort MRN: [id number] Date of Birth: [birth year]  Today's Date: 08/14/2013 Time: 1100-1158 Time Calculation (min): 58 min  Short Term Goals: Week 3:  OT Short Term Goal 1 (Week 3): Pt will consistently transfer to [facility] with mod A OT Short Term Goal 2 (Week 3): Pt will perform LB bathing with mod A using AE prn OT Short Term Goal 3 (Week 3): Pt will consistently stand with mod A to perform LB bathing  Skilled Therapeutic Interventions/Progress Updates:    Pt resting in bed upon arrival and agreeable to participating in bathing and dressing tasks.  Pt required min verbal cues for initiation and sequencing of tasks. Pt performed stand pivot transfer to w/c with mod A.  Pt completed bathing and dressing tasks at sink with sit<>stand for LB dressing.  Pt required mod A for sit<>stand but maintained standing at sink with BUE support while therapist pulled up pants.  Pt used reacher to assist with threading pants over feet.  Pt declined use of sock aide stating that when she pulled on the device it hurt her back.  Pt remained in w/c at end of session with all needs within reach. Focus on activity tolerance, sit<>stand, standing balance, AE use, safety awareness.  Therapy Documentation Precautions:  Precautions Precautions: Back;Fall Precaution Comments: Fistula L arm Required Braces or Orthoses: Spinal Brace Restrictions Weight Bearing Restrictions: No   Pain: Pain Assessment Pain Assessment: No/denies pain  See FIM for current functional status  Therapy/Group: Individual Therapy  Leroy Libman 08/14/2013, 11:58 AM

## 2013-08-14 NOTE — Discharge Summary (Signed)
Physician Discharge Summary  Patient ID: Holly Ellison MRN: [id number] DOB/AGE: [birth year] 72 y.o.  Admit date: 07/19/2013 Discharge date: 08/15/2013  Discharge Diagnoses:  Principal Problem:   Compression of spinal cord with myelopathy Active Problems:   Peripheral neuropathy   Diabetes mellitus   Hypertension   Hyperthyroidism   ESRD on dialysis   Discharged Condition: Stable  Significant Diagnostic Studies: Dg Thoracolumbar Spine  07/31/13   CLINICAL DATA:  Thoracic laminectomy for tumor.  EXAM: THORACOLUMBAR SPINE - 2 VIEW  COMPARISON:  MRI of the thoracic spine July 16, 2013.  FINDINGS: Two frontal prone radiographs of the thoracolumbar junction, appearing interoperative, radiologist was not present.  A grid has been placed over the thoracolumbar spine, needle markers overlie the L3, L1 vertebral bodies and inferior endplate of T9 and I94. Status post median sternotomy.  IMPRESSION: Limited intraoperative AP views of the thoracolumbar spine demonstrate markers indicating T9, and T11 inferior endplate in the L1, and L3 vertebral bodies.   Electronically Signed   By: Elon Alas   On: Jul 31, 2013 02:46   Dg Abd 1 View  08/01/2013   CLINICAL DATA:  Abdominal pain, history diabetes, hypertension, coronary artery disease post MI, CHF  EXAM: ABDOMEN - 1 VIEW  COMPARISON:  06/05/2011  FINDINGS: Increased stool in colon.  No bowel dilatation or bowel wall thickening.  Gas in stomach.  Numerous pelvic phleboliths.  Bones demineralized.  IMPRESSION: Increased stool in colon without gross evidence of bowel obstruction or dilatation.   Electronically Signed   By: Lavonia Dana M.D.   On: 08/01/2013 10:59    Labs:  Basic Metabolic Panel:  Recent Labs Lab 08/10/13 1316 08/15/13 0500  NA 133* 136*  K 4.1 4.4  CL 90* 93*  CO2 26 27  GLUCOSE 144* 125*  BUN 22 21  CREATININE 5.14* 4.73*  CALCIUM 9.2 9.0  PHOS 5.2* 4.3    CBC:  Recent Labs Lab 08/10/13 1316 08/15/13 0500  WBC  7.1 7.6  HGB 10.7* 9.4*  HCT 32.8* 29.6*  MCV 97.0 100.7*  PLT 362 462*    CBG:  Recent Labs Lab 08/14/13 0722 08/14/13 1146 08/14/13 1635 08/14/13 2125 08/15/13 0714  GLUCAP 86 158* 118* 224* 118*    Brief HPI:   Holly Ellison is a 73 y.o. female with history of DM type 2 with neuropathy, CAD, COPD, ESRD, HA, who was admitted on 07/16/13 pm with one week history of BLE, fecal incontinence and severe back pain. 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 Vertell Limber. Frozen section appeared benign and NS questions old or indolent osteomyelitis. Patient with history of MSSA bacteremia 07/2012 s/p 4 weeks of antibiotic therapy. CT chest without evidence of malignancy. HD ongoing and labs with evidence of ABLA with down to 7.1 as well as leucocytosis with WBC-16.0.     Hospital Course: Holly Ellison was admitted to rehab 07/19/2013 for inpatient therapies to consist of PT, ST and OT at least three hours five days a week. Past admission physiatrist, therapy team and rehab RN have worked together to provide customized collaborative inpatient rehab. Hemodialysis has been ongoing on T, Th, Sa schedule. Blood pressures have been  reasonably controlled on current regimen.Reactive leucocytosis has resolved.  She was transfused with 2 units PRBC for anemia of chronic disease. She was loaded with IV iron and maintained on weekly  darbepoetin.  Diabetes has been monitored on achs basis and lantus dose was decreased secondary to low BS from poor po intake. Multiple nutritional supplements were added to help with appetite, intake as well as wound healing. Family was also encouraged to bring food from home.   Progress in therapy has been limited due to pain as well as back spasms, poor motivation as well as inconsistent participation. She was unable to  tolerate high dose narcotics and family did not want use of any muscle relaxers. Celexa was added to help with depressed mood but was discontinued due to family request and concerns regarding SE. She was started on aggressive bowel program to help with abdominal discomfort due to constipation.  Back incision has been healing well without S/S of infection. Patient continues to require max assistance and family has elected on SNF for follow up therapies. Bed is available at [facility] in [place] and patient discharged on 08/15/13.   Rehab course: During patient's stay in rehab weekly team conferences were held to monitor patient's progress, set goals and discuss barriers to discharge. She has had improved activity tolerance, improved balance, postural control, functional use of RIGHT upper, RIGHT lower, LEFT upper and LEFT lower extremity and improved attention. She requires moderate assist for transfers as well as standing balance with ADL tasks. She is able to complete upper body dressing with supervision. She requires moderate assist for bathing, total assist for lowe body care and toileting. She requires min assist for transfers and is able to ambulate 15 feet with min assist and verbal cues for safety and posture.    Disposition: Skilled Nursing Facility  Diet: Renal diet. 1200 FR/day.   Special Instructions: 1. Continue to check BS ac/hs and adjust lantus as needed. 2. Wear brace when out of bed.  3. Offer Nephro with meals.      Medication List    STOP taking these medications       gabapentin 100 MG capsule  Commonly known as:  NEURONTIN     LANTUS SOLOSTAR 100 UNIT/ML Solostar Pen  Generic drug:  Insulin Glargine  Replaced by:  insulin glargine 100 UNIT/ML injection     metoprolol succinate 100 MG 24 hr tablet  Commonly known as:  TOPROL-XL     silver sulfADIAZINE 1 % cream  Commonly known as:  SILVADENE      TAKE these medications       acetaminophen 325 MG tablet  Commonly  known as:  TYLENOL  Take 1-2 tablets (325-650 mg total) by mouth every 4 (four) hours as needed for mild pain.     albuterol 108 (90 BASE) MCG/ACT inhaler  Commonly known as:  PROVENTIL HFA;VENTOLIN HFA  Inhale 2 puffs into the lungs every 6 (six) hours as needed for wheezing.     ALPRAZolam 0.25 MG tablet  Commonly known as:  XANAX  Take 1 tablet (0.25 mg total) by mouth 3 (three) times daily as needed for anxiety.     amLODipine 5 MG tablet  Commonly known as:  NORVASC  Take 1 tablet (5 mg total) by mouth daily.     aspirin 81 MG EC tablet  Take 1 tablet (81 mg total) by mouth daily.     atorvastatin 40 MG tablet  Commonly known as:  LIPITOR  Take 40 mg by mouth at bedtime.     b complex-vitamin c-folic acid 0.8 MG Tabs tablet  Take 1 tablet by mouth daily.     bisacodyl 10 MG suppository  Commonly  known as:  DULCOLAX  Place 1 suppository (10 mg total) rectally daily at 6 (six) AM.     calcium carbonate 750 MG chewable tablet  Commonly known as:  TUMS EX  Chew 1 tablet (750 mg total) by mouth 3 (three) times daily as needed for heartburn.     clopidogrel 75 MG tablet  Commonly known as:  PLAVIX  Take 1 tablet (75 mg total) by mouth daily.     DSS 100 MG Caps  Take 100 mg by mouth 2 (two) times daily.     feeding supplement (RESOURCE BREEZE) Liqd  Take 1 Container by mouth 3 (three) times daily between meals.     HYDROcodone-acetaminophen 5-325 MG per tablet  Commonly known as:  NORCO/VICODIN  Take 1 tablet by mouth 2 (two) times daily.     insulin aspart 100 unit/mL injection  Commonly known as:  novoLOG  Inject 0-4 Units into the skin 3 (three) times daily before meals. Sliding scale: No units if <120, 3 units if 120-150, 4 if 150-180     insulin glargine 100 UNIT/ML injection  Commonly known as:  LANTUS  Inject 0.1 mLs (10 Units total) into the skin daily.     lanthanum 1000 MG chewable tablet  Commonly known as:  FOSRENOL  Chew 1 tablet (1,000 mg total)  by mouth 3 (three) times daily with meals.     lidocaine 5 % ointment  Commonly known as:  XYLOCAINE  daily as needed for mild pain (due to dialysis).     lidocaine 5 %  Commonly known as:  LIDODERM  Place 2 patches onto the skin daily. Remove & Discard patch within 12 hours or as directed by MD     methimazole 10 MG tablet  Commonly known as:  TAPAZOLE  Take 10 mg by mouth 3 (three) times daily.     metoprolol tartrate 25 MG tablet  Commonly known as:  LOPRESSOR  Take 1 tablet (25 mg total) by mouth 2 (two) times daily.     pantoprazole 40 MG tablet  Commonly known as:  PROTONIX  Take 40 mg by mouth daily.     ranolazine 500 MG 12 hr tablet  Commonly known as:  RANEXA  Take 1 tablet (500 mg total) by mouth 2 (two) times daily.     topiramate 25 MG tablet  Commonly known as:  TOPAMAX  Take 1 tablet (25 mg total) by mouth daily.     traZODone 50 MG tablet  Commonly known as:  DESYREL  Take 1 tablet (50 mg total) by mouth at bedtime.       Follow-up Information   Call Meredith Staggers, MD.   Specialty:  Physical Medicine and Rehabilitation   Contact information:   [address], [place] [address] [phone]       Call Sylvie Farrier, MD. (for follow up/pain management)    Contact information:   [address] [place] [address] [phone]       Follow up with Peggyann Shoals, MD. Call today. (for post op check in 2 weeks. )    Specialty:  Neurosurgery   Contact information:   [address] [place] [address] [phone]       Signed: Bary Leriche 08/15/2013, 9:16 AM

## 2013-08-14 NOTE — Patient Care Conference (Signed)
Inpatient RehabilitationTeam Conference and Plan of Care Update Date: 08/13/2013   Time: 3:10 PM    Patient Name: Holly Ellison      Medical Record Number: [id number]  Date of Birth: 1940-05-03 Sex: Female         Room/Bed: 4M04C/4M04C-01 Payor Info: Payor: MEDICARE / Plan: MEDICARE PART A AND B / Product Type: *No Product type* /    Admitting Diagnosis: T11 sp cord decomp   Admit Date/Time:  07/19/2013  5:07 PM Admission Comments: No comment available   Primary Diagnosis:  <principal problem not specified> Principal Problem: <principal problem not specified>  Patient Active Problem List   Diagnosis Date Noted  . Compression of spinal cord with myelopathy 07/19/2013  . Paraparesis 07/17/2013  . Bacteremia due to Staphylococcus aureus 07/28/2012  . HCAP (healthcare-associated pneumonia) 07/27/2012  . ESRD on dialysis 07/27/2012  . Chest pain 10/29/2011  . Hypertensive emergency 10/29/2011  . Bell's palsy 10/29/2011  . Hyperthyroidism 10/29/2011  . Pulmonary edema 06/05/2011  . 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 Peripheral neuropathy 06/05/2011  . Diabetes mellitus 06/05/2011  . Hypertension 06/05/2011  . COPD (chronic obstructive pulmonary disease) 06/05/2011  . Atherosclerosis of native arteries of the extremities with ulceration 05/25/2011    Expected Discharge Date: Expected Discharge Date:  (SNF)  Team Members Present: Physician leading conference: Dr. Alger Simons Social Worker Present: Lennart Pall, LCSW Nurse Present: Elliot Cousin, RN PT Present: Otis Brace, PT OT Present: Willeen Cass, Roland Earl, OT SLP Present: Weston Anna, SLP PPS Coordinator present : Daiva Nakayama, RN, CRRN;Becky Alwyn Ren, PT     Current Status/Progress Goal Weekly Team Focus  Medical   inconsistent particaiption due to pain, proprioception, psyc/cogniiton  improve activiity tolerance, consistency  pain, volume mgt   Bowel/Bladder   Incontinent of  bowel and bladder LBM 08-11-13  Managed bowel and bladder program  Assist with tolieting needs    Swallow/Nutrition/ Hydration             ADL's   bathing/dressing-mod A/max A when participating; transfers-mod a; limited participation requiring max encouragement  goals downgraded to mod A overall  activity tolerance, transfers, safety awareness, active participation   Mobility   Max encouragement for participation in therapy sessions. Close(S)-min A for bed mobility, sitting balance; Min-mod A for t/f sit<>stand, bed<>chair transfers and ambulation up to 30'. Decreased participation in sessions due to behavior and pain.   Supervision for sitting balance. Min A for dynamic standing balance, bed mobility, transfers, w/c propulsion. Mod A for short distance ambulation  Participation, educatio, tolerance, B LE strength, safety during functional mobility, ambulation   Communication             Safety/Cognition/ Behavioral Observations            Pain   Back pain managed with BID vicdon and prn tylenol and heating paded  <5  Assess pain q shift and prn   Skin   Thoracic incision scab over, patient complains of itching at  site allevyn applied for protection  No additional skin breakdown  Assess skin q shift and prn    Rehab Goals Patient on target to meet rehab goals: No *See Care Plan and progress notes for long and short-term goals.  Barriers to Discharge: inconsistencies, cognitive impairments    Possible Resolutions to Barriers:  supervision at discharge    Discharge Planning/Teaching Needs:  plan for SNF      Team Discussion:  Consistently requiring max assist and  encouragement to just get out of bed.  Once up, she is able to ambulate 30'.  Will ask RNing to assist with getting pt up prior to txs.  Still with plan for SNF.    Revisions to Treatment Plan:  Decrease txs to qd   Continued Need for Acute Rehabilitation Level of Care: The patient requires daily medical management by a  physician with specialized training in physical medicine and rehabilitation for the following conditions: Daily direction of a multidisciplinary physical rehabilitation program to ensure safe treatment while eliciting the highest outcome that is of practical value to the patient.: Yes Daily medical management of patient stability for increased activity during participation in an intensive rehabilitation regime.: Yes Daily analysis of laboratory values and/or radiology reports with any subsequent need for medication adjustment of medical intervention for : Neurological problems;Post surgical problems  Holly Ellison 08/14/2013, 9:33 AM

## 2013-08-14 NOTE — Progress Notes (Signed)
Social Work Patient ID: Holly Ellison, female   DOB: 1940-07-29, 73 y.o.   MRN: [id number]  Lennart Pall, LCSW Social Worker Signed  Patient Care Conference Service date: 08/14/2013 9:33 AM  Inpatient RehabilitationTeam Conference and Plan of Care Update Date: 08/13/2013   Time: 3:10 PM     Patient Name: Holly Ellison       Medical Record Number: [id number]   Date of Birth: [birth year] Sex: Female         Room/Bed: 4M04C/4M04C-01 Payor Info: Payor: MEDICARE / Plan: MEDICARE PART A AND B / Product Type: *No Product type* /   Admitting Diagnosis: T11 sp cord decomp   Admit Date/Time:  07/19/2013  5:07 PM Admission Comments: No comment available   Primary Diagnosis:  <principal problem not specified> Principal Problem: <principal problem not specified>    Patient Active Problem List     Diagnosis  Date Noted   .  Compression of spinal cord with myelopathy  07/19/2013   .  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  Bacteremia due to Staphylococcus aureus  07/28/2012   .  HCAP (healthcare-associated pneumonia)  07/27/2012   .  ESRD on dialysis  07/27/2012   .  Chest pain  10/29/2011   .  Hypertensive emergency  10/29/2011   .  Bell's palsy  10/29/2011   .  Hyperthyroidism  10/29/2011   .  Pulmonary edema  06/05/2011   .  CAD (coronary artery disease) of bypass graft  06/05/2011   .  Peripheral neuropathy  06/05/2011   .  Diabetes mellitus  06/05/2011   .  Hypertension  06/05/2011   .  COPD (chronic obstructive pulmonary disease)  06/05/2011   .  Atherosclerosis of native arteries of the extremities with ulceration  05/25/2011     Expected Discharge Date: Expected Discharge Date:  (SNF)  Team Members Present: Physician leading conference: Dr. Alger Simons Social Worker Present: Lennart Pall, LCSW Nurse Present: Elliot Cousin, RN PT Present: Otis Brace, PT OT Present: Willeen Cass, Roland Earl, OT SLP Present: Weston Anna, SLP PPS Coordinator present : Daiva Nakayama, RN, CRRN;Becky  Alwyn Ren, PT        Current Status/Progress  Goal  Weekly Team Focus   Medical     inconsistent particaiption due to pain, proprioception, psyc/cogniiton  improve activiity tolerance, consistency  pain, volume mgt   Bowel/Bladder     Incontinent of bowel and bladder LBM 08-11-13  Managed bowel and bladder program  Assist with tolieting needs    Swallow/Nutrition/ Hydration            ADL's     bathing/dressing-mod A/max A when participating; transfers-mod a; limited participation requiring max encouragement  goals downgraded to mod A overall  activity tolerance, transfers, safety awareness, active participation   Mobility     Max encouragement for participation in therapy sessions. Close(S)-min A for bed mobility, sitting balance; Min-mod A for t/f sit<>stand, bed<>chair transfers and ambulation up to 30'. Decreased participation in sessions due to behavior and pain.   Supervision for sitting balance. Min A for dynamic standing balance, bed mobility, transfers, w/c propulsion. Mod A for short distance ambulation  Participation, educatio, tolerance, B LE strength, safety during functional mobility, ambulation   Communication            Safety/Cognition/ Behavioral Observations           Pain     Back pain managed with BID vicdon and prn tylenol and heating paded  <5  Assess pain q shift and prn   Skin     Thoracic incision scab over, patient complains of itching at  site allevyn applied for protection  No additional skin breakdown  Assess skin q shift and prn    Rehab Goals Patient on target to meet rehab goals: No *See Care Plan and progress notes for long and short-term goals.    Barriers to Discharge:  inconsistencies, cognitive impairments      Possible Resolutions to Barriers:    supervision at discharge      Discharge Planning/Teaching Needs:    plan for SNF      Team Discussion:    Consistently requiring max assist and encouragement to just get out of bed.  Once up, she  is able to ambulate 30'.  Will ask RNing to assist with getting pt up prior to txs.  Still with plan for SNF.     Revisions to Treatment Plan:    Decrease txs to qd    Continued Need for Acute Rehabilitation Level of Care: The patient requires daily medical management by a physician with specialized training in physical medicine and rehabilitation for the following conditions: Daily direction of a multidisciplinary physical rehabilitation program to ensure safe treatment while eliciting the highest outcome that is of practical value to the patient.: Yes Daily medical management of patient stability for increased activity during participation in an intensive rehabilitation regime.: Yes Daily analysis of laboratory values and/or radiology reports with any subsequent need for medication adjustment of medical intervention for : Neurological problems;Post surgical problems  Holly Ellison 08/14/2013, 9:33 AM         Lennart Pall, [place] Worker Signed  Patient Care Conference Service date: 08/07/2013 6:10 AM  Inpatient RehabilitationTeam Conference and Plan of Care Update Date: 08/06/2013   Time: 3:10 PM     Patient Name: Holly Ellison       Medical Record Number: [id number]   Date of Birth: 04/15/1940 Sex: Female         Room/Bed: 4M04C/4M04C-01 Payor Info: Payor: MEDICARE / Plan: MEDICARE PART A AND B / Product Type: *No Product type* /   Admitting Diagnosis: T11 sp cord decomp   Admit Date/Time:  07/19/2013  5:07 PM Admission Comments: No comment available   Primary Diagnosis:  <principal problem not specified> Principal Problem: <principal problem not specified>    Patient Active Problem List     Diagnosis  Date Noted   .  Compression of spinal cord with myelopathy  07/19/2013   .  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  Bacteremia due to Staphylococcus aureus  07/28/2012   .  HCAP (healthcare-associated pneumonia)  07/27/2012   .  ESRD on dialysis  07/27/2012   .  Chest pain  10/29/2011   .   Hypertensive emergency  10/29/2011   .  Bell's palsy  10/29/2011   .  Hyperthyroidism  10/29/2011   .  Pulmonary edema  06/05/2011   .  CAD (coronary artery disease) of bypass graft  06/05/2011   .  Peripheral neuropathy  06/05/2011   .  Diabetes mellitus  06/05/2011   .  Hypertension  06/05/2011   .  COPD (chronic obstructive pulmonary disease)  06/05/2011   .  Atherosclerosis of native arteries of the extremities with ulceration  05/25/2011     Expected Discharge Date: Expected Discharge Date:  (SNF)  Team Members Present: Physician leading conference: Dr. Alysia Penna Social Worker Present: Lennart Pall, LCSW Nurse Present: Caryl Pina  Royal, RN PT Present: Melene Plan, PT OT Present: Gareth Morgan, Artemio Aly, OT SLP Present: Weston Anna, SLP PPS Coordinator present : Daiva Nakayama, RN, CRRN;Becky Alwyn Ren, PT        Current Status/Progress  Goal  Weekly Team Focus   Medical     Paraparesis, confusion overall improved. Pain overall improved still as headache  Improve mobility, decrease pain  Trial Topamax for headaches   Bowel/Bladder     Incontinent of bowel and bladder. LBM 08/03/13.   Managed bowel and bladder program  Continue with daily bowel program. Encourage pt to transfer to toilet   Swallow/Nutrition/ Hydration            ADL's     bathing/dressing-mod A/max A at bed level; transfers-mod A; decreased activity tolerance; decreased participation requiring max encouragement  bathing/dressing-supervision; toilet transfer-suprevision; tub transfer-min A  activity tolerance, transfers, UE strength, safety awareness, pain management; active participation   Mobility     Max encouragement for participation in therapy sessions. Close(S)-min A for bed mobility, sitting balance, mod A for bed<>chair transfers, Mod A for static standing w/ RW; Decreased participation in sessions due to pain and behavior.   Downgraded. Supervision for sitting balance. Min A for dynamic  standing balance, bed mobility, transfers, w/c propulsion. Mod A for short distance ambulation  Participation, educatoin, tolerance, B LE strength, safety during functional mobility, pain management   Communication            Safety/Cognition/ Behavioral Observations           Pain     Back discomfort. 0.5mg  of Vicidon scheduled BID  <5  Assess for effectiveness   Skin     Old sternum incision healed. Thoracic incision, OTA, healing  No additional skin breakdown  Assess q shift    Rehab Goals Patient on target to meet rehab goals: No Rehab Goals Revised: very limited progress this week due to emotional and mental distress *See Care Plan and progress notes for long and short-term goals.    Barriers to Discharge:  Mobility issues      Possible Resolutions to Barriers:    Family education      Discharge Planning/Teaching Needs:    d/c plan has changed to SNF      Team Discussion:    Pt with poor participation and missing at least 50% treatment time. Usually with c/o pain as reason she cannot do tx.  Expressing depressive thoughts with SW.  Discussed restart of antidepressant.  Very mixed picture of poor cognition, decreased mood, pain complaints and possible behavioral component that is affected overall progress.     Revisions to Treatment Plan:    D/c plan changed to SNF.  Restart of low dose anti-depressant.    Continued Need for Acute Rehabilitation Level of Care: The patient requires daily medical management by a physician with specialized training in physical medicine and rehabilitation for the following conditions: Daily direction of a multidisciplinary physical rehabilitation program to ensure safe treatment while eliciting the highest outcome that is of practical value to the patient.: Yes Daily medical management of patient stability for increased activity during participation in an intensive rehabilitation regime.: Yes Daily analysis of laboratory values and/or radiology  reports with any subsequent need for medication adjustment of medical intervention for : Neurological problems;Other  Holly Ellison 08/07/2013, 6:10 AM         Lennart Pall, LCSW Social Worker Signed  Patient Care Conference Service date: 07/31/2013 6:42 AM  Inpatient RehabilitationTeam  Conference and Plan of Care Update Date: 07/30/2013   Time: 3:15 PM     Patient Name: Holly Ellison       Medical Record Number: [id number]   Date of Birth: [birth year] Sex: Female         Room/Bed: 4M11C/4M11C-01 Payor Info: Payor: MEDICARE / Plan: MEDICARE PART A AND B / Product Type: *No Product type* /   Admitting Diagnosis: T11 sp cord decomp   Admit Date/Time:  07/19/2013  5:07 PM Admission Comments: No comment available   Primary Diagnosis:  <principal problem not specified> Principal Problem: <principal problem not specified>    Patient Active Problem List     Diagnosis  Date Noted   .  Compression of spinal cord with myelopathy  07/19/2013   .  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  Bacteremia due to Staphylococcus aureus  07/28/2012   .  HCAP (healthcare-associated pneumonia)  07/27/2012   .  ESRD on dialysis  07/27/2012   .  Chest pain  10/29/2011   .  Hypertensive emergency  10/29/2011   .  Bell's palsy  10/29/2011   .  Hyperthyroidism  10/29/2011   .  Pulmonary edema  06/05/2011   .  CAD (coronary artery disease) of bypass graft  06/05/2011   .  Peripheral neuropathy  06/05/2011   .  Diabetes mellitus  06/05/2011   .  Hypertension  06/05/2011   .  COPD (chronic obstructive pulmonary disease)  06/05/2011   .  Atherosclerosis of native arteries of the extremities with ulceration  05/25/2011     Expected Discharge Date: Expected Discharge Date: 08/08/13  Team Members Present:         Current Status/Progress  Goal  Weekly Team Focus   Medical     confusion---had to stop sedating meds---pain an issue  see prior  pain control. mental status   Bowel/Bladder     MOSTLY INCONTINENT OF BOPWEL  AND BLADDER, PATIENT STATES UNABLE TO TELL WHEN NEEDING TO GO TO THE BATHROOM ALL THE TIME, CHECKED FOR IMPACTION, SOFT STOOL NOTED IN RECTAL VAULT, UBALE TO MOVE BOWELS.  CONSIDER BOWEL PROGRAM?  Patient to regain continence  Timed toileting patient q3h and prn   Swallow/Nutrition/ Hydration            ADL's     bathing/dressing-mod A/max A; transfers-mod A; decreased activity tolerance; some confusion  bathing/dressing-supervision; toilet transfer-suprevision; tub transfer-min A  activity tolerance, transfers, UE strength, safety awareness, pain management   Mobility     Close(S)-min A for sitting balance and w/c propulsion, Mod A for bed mobility, squat pivot transfers and short distance amb w/ RW  Supervision-Min A, combination of w/c and ambulation  functional endurance, tolerance, B LE strength, safety during functional mobility, transfers, bed mobility, ambulation, intellectual/emergent awareness   Communication            Safety/Cognition/ Behavioral Observations    No safety issues noted       Pain     C/o mid back pain; getting some relief with po tylenol and lidocaine patches  <5  Assess and treat for pain q shift and prn   Skin     FRICTION TEAR AT SACRUM, BARRIER CREAM APPLIED, INCISION TO BACK OPEN TO AIR ,  DERMABOND PEELING.   No skin infection or breakdown during rehab stay  Assess skin q shift and prn    Rehab Goals Patient on target to meet rehab goals: Yes *See Care Plan and progress notes  for long and short-term goals.    Barriers to Discharge:  cognition, see prior      Possible Resolutions to Barriers:    see prior, family ed      Discharge Planning/Teaching Needs:    home with son and daughter providing 24/7 assistance      Team Discussion:    Sensitivity to narcotics along with premorbid dementia appear to be affected progress with therapies.  Team feels first couple of days on CIR were her best and since has declined cognitively and in physical endurance.   Decreased tolerance for activities. Pt cites pain as her primary barrier, however, MD must use pain meds sparingly because of their affect on her cogn/ phys functioning.  May need to downgrade goals.  MD to address pain carefully.  SW will follow up with pt and family to determine if d/c home is still appropriate as family will likely have to provide more assistance than initially thought.   Revisions to Treatment Plan:    Possible downgrading of some goals and change of d/c plan    Continued Need for Acute Rehabilitation Level of Care: The patient requires daily medical management by a physician with specialized training in physical medicine and rehabilitation for the following conditions: Daily direction of a multidisciplinary physical rehabilitation program to ensure safe treatment while eliciting the highest outcome that is of practical value to the patient.: Yes Daily medical management of patient stability for increased activity during participation in an intensive rehabilitation regime.: Yes Daily analysis of laboratory values and/or radiology reports with any subsequent need for medication adjustment of medical intervention for : Post surgical problems;Neurological problems;Other  Lennart Pall 07/31/2013, 6:42 AM         Lennart Pall, LCSW Social Worker Signed  Patient Care Conference Service date: 07/24/2013 7:28 AM  Inpatient RehabilitationTeam Conference and Plan of Care Update Date: 07/23/2013   Time: 3:30 PM     Patient Name: Holly Ellison       Medical Record Number: [id number]   Date of Birth: [birth year] Sex: Female         Room/Bed: 4M11C/4M11C-01 Payor Info: Payor: MEDICARE / Plan: MEDICARE PART A AND B / Product Type: *No Product type* /   Admitting Diagnosis: T11 sp cord decomp   Admit Date/Time:  07/19/2013  5:07 PM Admission Comments: No comment available   Primary Diagnosis:  <principal problem not specified> Principal Problem: <principal problem not specified>     Patient Active Problem List     Diagnosis  Date Noted   .  Compression of spinal cord with myelopathy  07/19/2013   .  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  Bacteremia due to Staphylococcus aureus  07/28/2012   .  HCAP (healthcare-associated pneumonia)  07/27/2012   .  ESRD on dialysis  07/27/2012   .  Chest pain  10/29/2011   .  Hypertensive emergency  10/29/2011   .  Bell's palsy  10/29/2011   .  Hyperthyroidism  10/29/2011   .  Pulmonary edema  06/05/2011   .  CAD (coronary artery disease) of bypass graft  06/05/2011   .  Peripheral neuropathy  06/05/2011   .  Diabetes mellitus  06/05/2011   .  Hypertension  06/05/2011   .  COPD (chronic obstructive pulmonary disease)  06/05/2011   .  Atherosclerosis of native arteries of the extremities with ulceration  05/25/2011     Expected Discharge Date: Expected Discharge Date: 08/08/13  Team Members Present: Physician  leading conference: Dr. Alger Simons Social Worker Present: Lennart Pall, LCSW Nurse Present: Elliot Cousin, RN PT Present: Melene Plan, Cottie Banda, PT OT Present: Roanna Epley, COTA;Kayla Perkinson, OT;Jennifer Tamala Julian, OT SLP Present: Weston Anna, SLP PPS Coordinator present : Daiva Nakayama, RN, CRRN;Becky Alwyn Ren, PT        Current Status/Progress  Goal  Weekly Team Focus   Medical     T11 DESTRUCTIVE LESION (?OSTEO) WITH MYELOPATHY  IMPROVE FUNCTIONAL MOBILITY  PAIN CONTROL, nmr   Bowel/Bladder     Continent of bowel and bladder. LBM 6/11/5  Pt to remain continent.  Monitor for regular bowel movement q 2-3 days   Swallow/Nutrition/ Hydration            ADL's     bathing/dressing-mod A; transfers-mod A/max A; decreased activity tolerance/BUE strength  bathing/dressing-supervision; toilet transfer-suprevision; tub transfer-min A  activity tolerance, transfers, UE strength, safety awareness   Mobility     Close(S)-min guard A for sitting balance; Max A for bed mobility and w/c propulsion, Mod A for squat pivot  transfers and ambulation x10'i n // bars  Supervision-Min A, combination of w/c and ambulation  functional endurance, B LE strength, safety during functional mobility, transfers, bed mobility, ambulation, pt education   Communication            Safety/Cognition/ Behavioral Observations           Pain     No c/o pain  <3  Monitor for nonverbal cues    Skin     Hemodialysis cath LUE ++, Honeycomb dressing to surgical incision, unremarkable,   No additional skin breakdown  Routine turn q 2hrs    Rehab Goals Patient on target to meet rehab goals: Yes *See Care Plan and progress notes for long and short-term goals.    Barriers to Discharge:  NEURO DEFICITS, PAIN      Possible Resolutions to Barriers:    NMR, ADAPTIVE EQUIPMENT, SCI EDUCATION      Discharge Planning/Teaching Needs:    home with son and daughter providing 24/7 assistance      Team Discussion:    Pt very motivated, but extremely deconditioned and weak.  Anticipate need for longer LOS to meet supervision/ min assist goals.  No concerns at this time.   Revisions to Treatment Plan:    None    Continued Need for Acute Rehabilitation Level of Care: The patient requires daily medical management by a physician with specialized training in physical medicine and rehabilitation for the following conditions: Daily direction of a multidisciplinary physical rehabilitation program to ensure safe treatment while eliciting the highest outcome that is of practical value to the patient.: Yes Daily medical management of patient stability for increased activity during participation in an intensive rehabilitation regime.: Yes Daily analysis of laboratory values and/or radiology reports with any subsequent need for medication adjustment of medical intervention for : Neurological problems;Post surgical problems  Holly Ellison 07/24/2013, 7:28 AM

## 2013-08-14 NOTE — Progress Notes (Addendum)
Physical Therapy Discharge Summary  Patient Details  Name: Holly Ellison MRN: [id number] Date of Birth: 01-Jun-1940  Today's Date: 08/14/2013  Patient has met 7 of 7 long term goals due to improved activity tolerance, improved balance, increased strength, decreased pain and functional use of right lower extremity and left lower extremity. Pt's original LTGs downgraded due to inconsistent participation in therapy from pain and behavior, req max encouragement at times for participation. Pt to d/c to SNF as family unable to provide the necessary level of physical assistance at discharge. Patient to discharge at combination of w/c and ambulatory level with overall min A.  Reasons goals not met: N/A  Recommendation:  Patient will benefit from ongoing skilled PT services in skilled nursing facility setting to continue to advance safe functional mobility, address ongoing impairments in decreased functional endurance, decreased balance, decreased strength, decreased tolerance, decreased ability to maintain back precautions, decreased safety awareness, decreased emergent awareness, decreased seq, decreased problem solving, and minimize fall risk.  Equipment: No equipment provided  Reasons for discharge: treatment goals met and discharge from hospital  Patient/family agrees with progress made and goals achieved: Yes  PT Discharge Precautions/Restrictions Precautions Precautions: Back;Fall Precaution Comments: Fistula L arm, however, pt does not follow back precautions despite consistent education from therapist as pt only finds relief with flexed trunk posture when sitting upright Required Braces or Orthoses: Spinal Brace Restrictions Weight Bearing Restrictions: No Vision/Perception  Visual History Wears glasses- at all times, occasional blurring of vision Visual Assessment No apparent visual deficits Cognition Overall Cognitive Status: Impaired/Different from baseline Arousal/Alertness:  Awake/alert Orientation Level: Oriented X4 Attention: Selective Selective Attention: Impaired Selective Attention Impairment: Functional basic Memory: Impaired Memory Impairment: Decreased recall of new information Awareness: Impaired Awareness Impairment: Emergent impairment Problem Solving: Impaired Problem Solving Impairment: Functional basic Safety/Judgment: Impaired Sensation Sensation Light Touch: Impaired by gross assessment Hot/Cold: Impaired by gross assessment Additional Comments: Pt has peripheral neuropathy in B UE/LE Coordination Gross Motor Movements are Fluid and Coordinated: No (due to weakness ) Fine Motor Movements are Fluid and Coordinated: No Motor  Motor Motor: Other (comment) Motor - Discharge Observations: generalized weakness  Mobility Bed Mobility Bed Mobility: Supine to Sit;Sit to Supine Rolling Right: 4: Min assist Rolling Right Details: Verbal cues for technique Rolling Left: 4: Min assist Rolling Left Details: Verbal cues for technique Supine to Sit: 4: Min assist;HOB elevated;With rails Supine to Sit Details: Verbal cues for technique;Verbal cues for sequencing Sit to Supine: 4: Min assist;HOB flat;With rail;HOB elevated Sit to Supine - Details: Verbal cues for sequencing;Verbal cues for technique Transfers Transfers: Yes Sit to Stand: 4: Min assist Sit to Stand Details: Verbal cues for sequencing;Verbal cues for technique;Verbal cues for precautions/safety Stand to Sit: 4: Min assist Stand to Sit Details (indicate cue type and reason): Verbal cues for precautions/safety;Verbal cues for technique;Verbal cues for sequencing Stand Pivot Transfers: 4: Min assist Stand Pivot Transfer Details: Verbal cues for sequencing;Verbal cues for technique;Verbal cues for gait pattern Locomotion  Ambulation Ambulation: Yes Ambulation/Gait Assistance: 4: Min assist Ambulation Distance (Feet): 15 Feet Assistive device: Rolling walker Ambulation/Gait  Assistance Details: Verbal cues for gait pattern;Verbal cues for safe use of DME/AE Gait Gait: Yes Gait Pattern: Impaired Gait Pattern: Step-through pattern;Step-to pattern;Decreased step length - right;Decreased step length - left;Decreased stride length;Decreased hip/knee flexion - right;Decreased hip/knee flexion - left;Decreased dorsiflexion - right;Trunk flexed Gait velocity: decreased Stairs / Additional Locomotion Stairs: No Wheelchair Mobility Wheelchair Mobility: Yes Wheelchair Assistance: 4: Advertising account executive Details: Verbal cues  for technique;Tactile cues for sequencing Wheelchair Propulsion: Both upper extremities Wheelchair Parts Management: Needs assistance Distance: 75  Trunk/Postural Assessment  Cervical Assessment Cervical Assessment: Within Functional Limits Thoracic Assessment Thoracic Assessment: Exceptions to [facility] (back precautions) Lumbar Assessment Lumbar Assessment: Exceptions to [facility] (back precautions) Postural Control Postural Control: Deficits on evaluation Trunk Control: decreased Protective Responses: decreased  Balance Balance Balance Assessed: Yes Static Sitting Balance Static Sitting - Balance Support: Feet supported;No upper extremity supported Static Sitting - Level of Assistance: 6: Modified independent (Device/Increase time) Dynamic Sitting Balance Dynamic Sitting - Balance Support: Bilateral upper extremity supported;Feet supported Dynamic Sitting - Level of Assistance: 5: Stand by assistance Dynamic Sitting - Balance Activities: Lateral lean/weight shifting;Forward lean/weight shifting;Reaching for Consulting civil engineer Standing - Balance Support: Bilateral upper extremity supported Static Standing - Level of Assistance: 4: Min assist;5: Stand by assistance Dynamic Standing Balance Dynamic Standing - Balance Support: Bilateral upper extremity supported;Left upper extremity supported;Right upper extremity  supported Dynamic Standing - Level of Assistance: 5: Stand by assistance;4: Min assist Dynamic Standing - Balance Activities: Lateral lean/weight shifting;Forward lean/weight shifting;Reaching for objects Extremity Assessment  RUE Assessment RUE Assessment: Within Functional Limits RUE Strength RUE Overall Strength: Deficits Gross Grasp: Impaired LUE Assessment LUE Assessment: Exceptions to [facility] LUE Strength LUE Overall Strength: Deficits Gross Grasp: Impaired RLE Assessment RLE Assessment: Exceptions to [facility] RLE Strength RLE Overall Strength Comments: Grossly 3+/5 LLE Assessment LLE Assessment: Exceptions to [facility] LLE AROM (degrees) LLE Overall AROM Comments: Grossly 3+/5  See FIM for current functional status  Gilmore Laroche 08/14/2013, 5:54 PM

## 2013-08-14 NOTE — Progress Notes (Signed)
Patient ID: Holly Ellison, female   DOB: 29-Sep-1940, 73 y.o.   MRN: [id number] 73 y.o. female with history of DM type 2 with neuropathy, CAD, COPD, ESRD, HA, who was admitted on 07/16/13 pm with one week history of BLE, fecal incontinence and severe back pain. 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 Vertell Limber. Frozen section appeared benign and NS questions old or indolent osteomyelitis. Patient with history of MSSA bacteremia 07/2012 s/p 4 weeks of antibiotic therapy. CT chest without evidence of malignancy. HD ongoing and labs with evidence of ABLA with down to 7.1 as well as leucocytosis with WBC-16.0.    Subjective/Complaints: No new issues. Tearful at times with staff. Inconsistent performance  Review of Systems  A  review of systems has been performed and if not noted above is otherwise negative.   Objective: Vital Signs: Blood pressure 120/73, pulse 79, temperature 97.8 F (36.6 C), temperature source Oral, resp. rate 17, height 5' 6.14" (1.68 m), weight 68.9 kg (151 lb 14.4 oz), SpO2 99.00%. No results found. Results for orders placed during the hospital encounter of 07/19/13 (from the past 72 hour(s))  GLUCOSE, CAPILLARY     Status: Abnormal   Collection Time    08/11/13 11:16 AM      Result Value Ref Range   Glucose-Capillary 112 (*) 70 - 99 mg/dL  GLUCOSE, CAPILLARY     Status: Abnormal   Collection Time    08/11/13  2:28 PM      Result Value Ref Range   Glucose-Capillary 153 (*) 70 - 99 mg/dL  GLUCOSE, CAPILLARY     Status: Abnormal   Collection Time    08/11/13  4:44 PM      Result Value Ref Range   Glucose-Capillary 166 (*) 70 - 99 mg/dL  GLUCOSE, CAPILLARY     Status: Abnormal   Collection Time    08/11/13  8:44 PM      Result Value Ref Range   Glucose-Capillary 124 (*) 70 - 99 mg/dL  GLUCOSE, CAPILLARY      Status: None   Collection Time    08/12/13  7:14 AM      Result Value Ref Range   Glucose-Capillary 83  70 - 99 mg/dL   Comment 1 Notify RN    GLUCOSE, CAPILLARY     Status: Abnormal   Collection Time    08/12/13 11:16 AM      Result Value Ref Range   Glucose-Capillary 114 (*) 70 - 99 mg/dL   Comment 1 Notify RN    GLUCOSE, CAPILLARY     Status: Abnormal   Collection Time    08/12/13  4:41 PM      Result Value Ref Range   Glucose-Capillary 165 (*) 70 - 99 mg/dL  GLUCOSE, CAPILLARY     Status: None   Collection Time    08/12/13  9:17 PM      Result Value Ref Range   Glucose-Capillary 85  70 - 99 mg/dL  GLUCOSE, CAPILLARY     Status: None   Collection Time    08/13/13  7:21 AM      Result Value Ref Range   Glucose-Capillary 87  70 - 99 mg/dL   Comment 1 Notify RN    GLUCOSE, CAPILLARY     Status: None   Collection Time    08/13/13 11:17 AM  Result Value Ref Range   Glucose-Capillary 94  70 - 99 mg/dL   Comment 1 Notify RN    GLUCOSE, CAPILLARY     Status: None   Collection Time    08/13/13  8:34 PM      Result Value Ref Range   Glucose-Capillary 79  70 - 99 mg/dL  GLUCOSE, CAPILLARY     Status: None   Collection Time    08/14/13  7:22 AM      Result Value Ref Range   Glucose-Capillary 86  70 - 99 mg/dL   Comment 1 Notify RN        Nursing note and vitals reviewed.  Constitutional: She is oriented to person, place, and time. She appears well-developed and well-nourished. She is awake HENT:  Head: Normocephalic and atraumatic.  Oropharynx- poor dentition, moist and clear Eyes: Conjunctivae are normal. Pupils are equal, round, and reactive to light.  Neck: Normal range of motion. Neck supple.  Cardiovascular: Normal rate and regular rhythm.  Respiratory: Effort normal and breath sounds normal. No respiratory distress. She has no wheezes.  GI: Soft. Bowel sounds are normal. She exhibits no distension. There is no tenderness.  Musculoskeletal: She exhibits no  edema, back remains tender.  Neurological: She is oriented to person, cooperative. Follows commands. Fair insight.  Bilateral hands with thenar atrophy and contracture with decrease in fine motor movements. Numbness stocking-glove distribution persists.  Skin: Skin is warm and dry.   4   bilateral deltoid, bicep, tricep 3 minus bilateral finger flexors and extensors  2   bilateral hip flexors 3 minus bilaterally knee extensors 2 minus bilateral ankle dorsiflexors and plantar flexors Psych: oriented to person, place HD day, calm,cooperative  Assessment/Plan: 1. Functional deficits secondary to T11 lesion with paraplegia which require 3+ hours per day of interdisciplinary therapy in a comprehensive inpatient rehab setting. Physiatrist is providing close team supervision and 24 hour management of active medical problems listed below. Physiatrist and rehab team continue to assess barriers to discharge/monitor patient progress toward functional and medical goals.    FIM: FIM - Bathing Bathing Steps Patient Completed: Chest;Right Arm;Left Arm;Abdomen;Right upper leg;Left upper leg;Front perineal area Bathing: 3: Mod-Patient completes 5-7 67f 10 parts or 50-74%  FIM - Upper Body Dressing/Undressing Upper body dressing/undressing steps patient completed: Thread/unthread right sleeve of pullover shirt/dresss;Thread/unthread left sleeve of pullover shirt/dress;Put head through opening of pull over shirt/dress Upper body dressing/undressing: 4: Min-Patient completed 75 plus % of tasks FIM - Lower Body Dressing/Undressing Lower body dressing/undressing steps patient completed: Thread/unthread left pants leg;Thread/unthread right pants leg Lower body dressing/undressing: 1: Total-Patient completed less than 25% of tasks  FIM - Toileting Toileting steps completed by patient: Adjust clothing prior to toileting;Performs perineal hygiene;Adjust clothing after toileting Toileting Assistive Devices: Grab  bar or rail for support Toileting: 2: Max-Patient completed 1 of 3 steps  FIM - Radio producer Devices: Recruitment consultant Transfers: 4-To toilet/BSC: Min A (steadying Pt. > 75%)  FIM - Bed/Chair Transfer Bed/Chair Transfer Assistive Devices: Bed rails;Arm rests Bed/Chair Transfer: 3: Chair or W/C > Bed: Mod A (lift or lower assist)  FIM - Locomotion: Wheelchair Distance: 75 Locomotion: Wheelchair: 2: Travels 50 - 149 ft with minimal assistance (Pt.>75%) FIM - Locomotion: Ambulation Locomotion: Ambulation Assistive Devices: Administrator Ambulation/Gait Assistance: 4: Min guard Locomotion: Ambulation: 1: Travels less than 50 ft with minimal assistance (Pt.>75%)  Comprehension Comprehension Mode: Auditory Comprehension: 5-Follows basic conversation/direction: With extra time/assistive device  Expression Expression  Mode: Verbal Expression: 4-Expresses basic 75 - 89% of the time/requires cueing 10 - 24% of the time. Needs helper to occlude trach/needs to repeat words.  Social Interaction Social Interaction: 3-Interacts appropriately 50 - 74% of the time - May be physically or verbally inappropriate.  Problem Solving Problem Solving: 3-Solves basic 50 - 74% of the time/requires cueing 25 - 49% of the time  Memory Memory: 3-Recognizes or recalls 50 - 74% of the time/requires cueing 25 - 49% of the time  Medical Problem List and Plan:  1. 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  2. DVT Prophylaxis/Anticoagulation: Mechanical: Antiembolism stockings, thigh (TED hose) Bilateral lower extremities  Sequential compression devices, below knee Bilateral lower extremities  3. Pain Management: lidoderm patches for back.   -utilize, tylenol, ice, heat--   -  LOW dose scheduled hydrocodone at 7am and 12 noon each day to coincide with therapy History of migraines, no  prophyllaxis- not relieved by tylenol, trial topamax qhs , monitor sedation 4. H/o depression/Mood: Resume lexapro. LCSW to follow for evaluation and support.  5. Neuropsych: This patient is not currently capable of making decisions on her own behalf.  6. ESRD: Continue HD on TTS after therapy sessions.  7. 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 Inconsistent PO   -fair control at present 8. CAD: Continue plavix, lipitor, lopressor and ranexa.  9. Leucocytosis: Monitor for signs of infection. WBC 11.3 10. HTN:  norvasc  5mg  daily---increase to 10mg     - Prn po clonidine in place.  11. Acute on chronic anemia: On aranesp. Transfusion with dialysis prn. Stable/improving 12. AMS: baseline dementia. Episodic confusion persists     LOS (Days) 26 A FACE TO FACE EVALUATION WAS PERFORMED  Kriss Ishler T 08/14/2013, 8:27 AM

## 2013-08-14 NOTE — Progress Notes (Signed)
Physical Therapy Session Note  Patient Details  Name: Holly Ellison MRN: [id number] Date of Birth: Aug 13, 1940  Today's Date: 08/14/2013 Time: 1027-2536 Time Calculation (min): 45 min  Short Term Goals: Week 4:  PT Short Term Goal 1 (Week 4): STGs=LTGs due to anticipated d/c to SNF  Skilled Therapeutic Interventions/Progress Updates:  1:1. Pt received sitting in w/c, but not acknowledging therapist. Therapist attempting to engage in conversation with pt, but pt looking opposite direction and shaking head no. With repeated questioning, pt eventually relaying that she did not want to do therapy do to having a headache (reported that RN aware and provided pain medicine) as well as feeling very anxious about pending MRI today. Emotional support providing and pt agreeable to participation in therapy w/ mod encouragement and outline of main goal for session to complete car transfer. Per SW, pt's son would have to providing pt's transportation to/from dialysis at [facility]. Pt req min A for amb x10' w/c>car and safe completion of car transfer. Pt found to be incontinent of bowel and assisted back to room for clean up. Pt req total A for clothing management and pericare, but able to maintain standing multiple times 30-72min for clean up w/ close(S)-min guard A. However, in standing pt demonstrated extremely poor tolerance to upright posture consistently bending forward over walk for pain relief in back. Therapist provided education, regarding spinal precautions but quickly dismissed by pt due to pain. Pt req min A for t/f sit>sup and B rolling in bed for repositioning. Pt declined further participation in session without medical reason/pain, missing 61min. Pt left w/ all needs in reach, bed alarm on and nurse tech in room.   Therapy Documentation Precautions:  Precautions Precautions: Back;Fall Precaution Comments: Fistula L arm Required Braces or Orthoses: Spinal Brace Restrictions Weight Bearing Restrictions:  No General: Amount of Missed PT Time (min): 15 Minutes Missed Time Reason: Patient unwilling/refused to participate without medical reason  See FIM for current functional status  Therapy/Group: Individual Therapy  Gilmore Laroche 08/14/2013, 1:46 PM

## 2013-08-14 NOTE — Progress Notes (Signed)
[facility] Progress Note  Subjective:   Back pain, incontinence, poor appetite. Disapp  Objective Filed Vitals:   08/13/13 1930 08/13/13 2023 08/14/13 0545 08/14/13 1300  BP: 123/56 140/59 120/73 137/43  Pulse: 87 84 79 74  Temp: 97.7 F (36.5 C)  97.8 F (36.6 C) 97.3 F (36.3 C)  TempSrc: Oral  Oral Oral  Resp: 18  17 18   Height:      Weight: 70.4 kg (155 lb 3.3 oz)  68.9 kg (151 lb 14.4 oz)   SpO2: 98%  99% 99%   Physical Exam General: Alert, cooperative, NAD Heart: RRR Lungs: CTA bilat, no wheezes/rhonchi Abdomen: soft, NT, +BS Extremities: No LE edema Dialysis Access: Lt AVF + bruit  HD: [facility]  4h 2/2.25 Bath 73kg LUA AVF Heparin 4500  Aranesp 25 mcg q 4 weeks, last 6/9 Hectorol 4 ug TIW  Tsat 26%, ferr 1119, pth 286   Assessment/Plan: 1. 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 MRI planned. 2. Debility - on rehab 3. ESRD For HD TTS 4. HTN/Volume - SBPs 120s-130s. Under EDW. Net UF 1.7L yesterday. BP dropping w UF attempts. On Norvasc 5 and metop 50 BID. Will lower metoprolol to 25mg  BID and add hold parameters. 5. Anemia - Last Hgb 10.7. Recheck pending. Cont aranesp 100 q week. Fe load in progress.  6. 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 Phos controlled but creeping up on Tums. Change to Fosrenol. Hectorol 4 mcg on hold. Recheck labs 7. Nutrition - Alb low. Poor appetite. Will liberalize diet some. Multivitamin. Supplements..  8. CAD - 7 stents, CABG, on Ranexa.  9. DM - insulin per primary 10. Dispo - [facility] [place] [facility]. Son will transport to dialysis in [place]. Plan HD first shift.  Collene Leyden. Rhodia Albright [facility] Pager [phone] 08/14/2013,3:58 PM  LOS: 26 days   Renal Attending. Stable on rehab with some progress per her report.  Will plan for HD in AM in anticipation of discharge. Broady Lafoy  C    Additional Objective Labs: Basic Metabolic Panel:  Recent Labs Lab 08/10/13 1316  NA 133*  K 4.1  CL 90*  CO2 26  GLUCOSE 144*  BUN 22  CREATININE 5.14*  CALCIUM 9.2  PHOS 5.2*   Liver Function Tests:  Recent Labs Lab 08/10/13 1316  ALBUMIN 3.0*   CBC:  Recent Labs Lab 08/10/13 1316  WBC 7.1  HGB 10.7*  HCT 32.8*  MCV 97.0  PLT 362   Blood Culture    Component Value Date/Time   SDES BLOOD THUMB LEFT 07/17/2013 1110   SPECREQUEST BOTTLES DRAWN AEROBIC ONLY 6CC 07/17/2013 1110   CULT  Value: NO GROWTH 5 DAYS Performed at [facility] 07/17/2013 1110   REPTSTATUS 07/23/2013 FINAL 07/17/2013 1110    Cardiac Enzymes: No results found for this basename: CKTOTAL, CKMB, CKMBINDEX, TROPONINI,  in the last 168 hours CBG:  Recent Labs Lab 08/13/13 0721 08/13/13 1117 08/13/13 2034 08/14/13 0722 08/14/13 1146  GLUCAP 87 94 79 86 158*    Studies/Results: No results found. Medications:   . amLODipine  5 mg Oral Daily  . aspirin EC  81 mg Oral Daily  . atorvastatin  40 mg Oral q1800  . bisacodyl  10 mg Rectal Q0600  . clopidogrel  75 mg Oral Daily  . darbepoetin (ARANESP) injection - DIALYSIS  100 mcg Intravenous Q  Thu-HD  . docusate sodium  100 mg Oral BID  . feeding supplement (RESOURCE BREEZE)  1 Container Oral TID BM  . ferric gluconate (FERRLECIT/NULECIT) IV  125 mg Intravenous Q T,Th,Sa-HD  . HYDROcodone-acetaminophen  1 tablet Oral BID  . insulin aspart  0-9 Units Subcutaneous TID WC  . insulin glargine  10 Units Subcutaneous Daily  . lidocaine  2 patch Transdermal Q24H  . methimazole  10 mg Oral Daily  . metoprolol  50 mg Oral BID  . multivitamin  1 tablet Oral QHS  . pantoprazole  40 mg Oral Daily  . ranolazine  500 mg Oral BID  . topiramate  25 mg Oral Daily  . traZODone  50 mg Oral QHS

## 2013-08-15 ENCOUNTER — Encounter (HOSPITAL_COMMUNITY): Payer: Self-pay | Admitting: Anesthesiology

## 2013-08-15 ENCOUNTER — Encounter (HOSPITAL_COMMUNITY)
Admission: RE | Disposition: A | Discharge status: Skilled Nursing Facility | Payer: Self-pay | Source: Intra-hospital | Attending: Physical Medicine & Rehabilitation

## 2013-08-15 ENCOUNTER — Telehealth: Payer: Self-pay | Admitting: *Deleted

## 2013-08-15 ENCOUNTER — Inpatient Hospital Stay (HOSPITAL_COMMUNITY): Payer: Medicare Other

## 2013-08-15 LAB — RENAL FUNCTION PANEL
Albumin: 2.8 g/dL — ABNORMAL LOW (ref 3.5–5.2)
Anion gap: 16 — ABNORMAL HIGH (ref 5–15)
BUN: 21 mg/dL (ref 6–23)
CALCIUM: 9 mg/dL (ref 8.4–10.5)
CO2: 27 mEq/L (ref 19–32)
Chloride: 93 mEq/L — ABNORMAL LOW (ref 96–112)
Creatinine, Ser: 4.73 mg/dL — ABNORMAL HIGH (ref 0.50–1.10)
GFR calc Af Amer: 10 mL/min — ABNORMAL LOW (ref >90)
GFR calc non Af Amer: 8 mL/min — ABNORMAL LOW (ref >90)
Glucose, Bld: 125 mg/dL — ABNORMAL HIGH (ref 70–99)
Phosphorus: 4.3 mg/dL (ref 2.3–4.6)
Potassium: 4.4 mEq/L (ref 3.7–5.3)
Sodium: 136 mEq/L — ABNORMAL LOW (ref 137–147)

## 2013-08-15 LAB — CBC
HEMATOCRIT: 29.6 % — AB (ref 36.0–46.0)
Hemoglobin: 9.4 g/dL — ABNORMAL LOW (ref 12.0–15.0)
MCH: 32 pg (ref 26.0–34.0)
MCHC: 31.8 g/dL (ref 30.0–36.0)
MCV: 100.7 fL — ABNORMAL HIGH (ref 78.0–100.0)
Platelets: 462 10*3/uL — ABNORMAL HIGH (ref 150–400)
RBC: 2.94 MIL/uL — ABNORMAL LOW (ref 3.87–5.11)
RDW: 17.6 % — ABNORMAL HIGH (ref 11.5–15.5)
WBC: 7.6 10*3/uL (ref 4.0–10.5)

## 2013-08-15 LAB — GLUCOSE, CAPILLARY
Glucose-Capillary: 118 mg/dL — ABNORMAL HIGH (ref 70–99)
Glucose-Capillary: 109 mg/dL — ABNORMAL HIGH (ref 70–99)

## 2013-08-15 SURGERY — RADIOLOGY WITH ANESTHESIA
Anesthesia: General | Site: Back

## 2013-08-15 MED ORDER — DARBEPOETIN ALFA-POLYSORBATE 100 MCG/0.5ML IJ SOLN
INTRAMUSCULAR | Status: AC
  Filled 2013-08-15: qty 0.5

## 2013-08-15 NOTE — Progress Notes (Signed)
Report called to [facility]; all questions answered; awaiting transportation.

## 2013-08-15 NOTE — Telephone Encounter (Signed)
Per Algis Liming PA I checked [facility] for narcotic fills recently. She received #120 oxycodone 10 mg filled on 08/12/13 from Sylvie Farrier NP (her pain management practitioners) even though she has been in hospital.  I notified [facility] in [place] that she has been in [facility] under the care of Dr Naaman Plummer and is being discharged to a nursing facility today on hydrocodone.  They took the information and a physician there will contact Pam to discuss the care.

## 2013-08-15 NOTE — Progress Notes (Signed)
Patient ID: Holly Ellison, female   DOB: 12-24-1940, 73 y.o.   MRN: [id number] 73 y.o. female with history of DM type 2 with neuropathy, CAD, COPD, ESRD, HA, who was admitted on 07/16/13 pm with one week history of BLE, fecal incontinence and severe back pain. 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 Vertell Limber. Frozen section appeared benign and NS questions old or indolent osteomyelitis. Patient with history of MSSA bacteremia 07/2012 s/p 4 weeks of antibiotic therapy. CT chest without evidence of malignancy. HD ongoing and labs with evidence of ABLA with down to 7.1 as well as leucocytosis with WBC-16.0.    Subjective/Complaints: HD this morning. No new issues.   Review of Systems  A  review of systems has been performed and if not noted above is otherwise negative.   Objective: Vital Signs: Blood pressure 113/52, pulse 71, temperature 98.4 F (36.9 C), temperature source Oral, resp. rate 18, height 5' 6.14" (1.68 m), weight 67.8 kg (149 lb 7.6 oz), SpO2 100.00%. No results found. Results for orders placed during the hospital encounter of 07/19/13 (from the past 72 hour(s))  GLUCOSE, CAPILLARY     Status: Abnormal   Collection Time    08/12/13 11:16 AM      Result Value Ref Range   Glucose-Capillary 114 (*) 70 - 99 mg/dL   Comment 1 Notify RN    GLUCOSE, CAPILLARY     Status: Abnormal   Collection Time    08/12/13  4:41 PM      Result Value Ref Range   Glucose-Capillary 165 (*) 70 - 99 mg/dL  GLUCOSE, CAPILLARY     Status: None   Collection Time    08/12/13  9:17 PM      Result Value Ref Range   Glucose-Capillary 85  70 - 99 mg/dL  GLUCOSE, CAPILLARY     Status: None   Collection Time    08/13/13  7:21 AM      Result Value Ref Range   Glucose-Capillary 87  70 - 99 mg/dL   Comment 1 Notify RN    GLUCOSE, CAPILLARY      Status: None   Collection Time    08/13/13 11:17 AM      Result Value Ref Range   Glucose-Capillary 94  70 - 99 mg/dL   Comment 1 Notify RN    GLUCOSE, CAPILLARY     Status: None   Collection Time    08/13/13  8:34 PM      Result Value Ref Range   Glucose-Capillary 79  70 - 99 mg/dL  GLUCOSE, CAPILLARY     Status: None   Collection Time    08/14/13  7:22 AM      Result Value Ref Range   Glucose-Capillary 86  70 - 99 mg/dL   Comment 1 Notify RN    GLUCOSE, CAPILLARY     Status: Abnormal   Collection Time    08/14/13 11:46 AM      Result Value Ref Range   Glucose-Capillary 158 (*) 70 - 99 mg/dL   Comment 1 Notify RN    GLUCOSE, CAPILLARY     Status: Abnormal   Collection Time    08/14/13  4:35 PM      Result Value Ref Range   Glucose-Capillary 118 (*) 70 - 99 mg/dL  GLUCOSE, CAPILLARY     Status: Abnormal  Collection Time    08/14/13  9:25 PM      Result Value Ref Range   Glucose-Capillary 224 (*) 70 - 99 mg/dL  CBC     Status: Abnormal   Collection Time    08/15/13  5:00 AM      Result Value Ref Range   WBC 7.6  4.0 - 10.5 K/uL   RBC 2.94 (*) 3.87 - 5.11 MIL/uL   Hemoglobin 9.4 (*) 12.0 - 15.0 g/dL   HCT 29.6 (*) 36.0 - 46.0 %   MCV 100.7 (*) 78.0 - 100.0 fL   MCH 32.0  26.0 - 34.0 pg   MCHC 31.8  30.0 - 36.0 g/dL   RDW 17.6 (*) 11.5 - 15.5 %   Platelets 462 (*) 150 - 400 K/uL  RENAL FUNCTION PANEL     Status: Abnormal   Collection Time    08/15/13  5:00 AM      Result Value Ref Range   Sodium 136 (*) 137 - 147 mEq/L   Potassium 4.4  3.7 - 5.3 mEq/L   Chloride 93 (*) 96 - 112 mEq/L   CO2 27  19 - 32 mEq/L   Glucose, Bld 125 (*) 70 - 99 mg/dL   BUN 21  6 - 23 mg/dL   Creatinine, Ser 4.73 (*) 0.50 - 1.10 mg/dL   Calcium 9.0  8.4 - 10.5 mg/dL   Phosphorus 4.3  2.3 - 4.6 mg/dL   Albumin 2.8 (*) 3.5 - 5.2 g/dL   GFR calc non Af Amer 8 (*) >90 mL/min   GFR calc Af Amer 10 (*) >90 mL/min   Comment: (NOTE)     The eGFR has been calculated using the CKD EPI  equation.     This calculation has not been validated in all clinical situations.     eGFR's persistently <90 mL/min signify possible Chronic Kidney     Disease.   Anion gap 16 (*) 5 - 15  GLUCOSE, CAPILLARY     Status: Abnormal   Collection Time    08/15/13  7:14 AM      Result Value Ref Range   Glucose-Capillary 118 (*) 70 - 99 mg/dL   Comment 1 Notify RN        Nursing note and vitals reviewed.  Constitutional: She is oriented to person, place, and time. She appears well-developed and well-nourished. She is awake HENT:  Head: Normocephalic and atraumatic.  Oropharynx- poor dentition, moist and clear Eyes: Conjunctivae are normal. Pupils are equal, round, and reactive to light.  Neck: Normal range of motion. Neck supple.  Cardiovascular: Normal rate and regular rhythm.  Respiratory: Effort normal and breath sounds normal. No respiratory distress. She has no wheezes.  GI: Soft. Bowel sounds are normal. She exhibits no distension. There is no tenderness.  Musculoskeletal: She exhibits no edema, back remains tender.  Neurological: She is oriented to person, cooperative. Follows commands. Fair insight.  Bilateral hands with thenar atrophy and contracture with decrease in fine motor movements. Numbness stocking-glove distribution persists.  Skin: Skin is warm and dry.   4   bilateral deltoid, bicep, tricep 3 minus bilateral finger flexors and extensors  2   bilateral hip flexors 3 minus bilaterally knee extensors 2 minus bilateral ankle dorsiflexors and plantar flexors Psych: oriented to person, place HD day, calm,cooperative  Assessment/Plan: 1. Functional deficits secondary to T11 lesion with paraplegia which require 3+ hours per day of interdisciplinary therapy in a comprehensive inpatient rehab setting. Physiatrist is  providing close team supervision and 24 hour management of active medical problems listed below. Physiatrist and rehab team continue to assess barriers to  discharge/monitor patient progress toward functional and medical goals.   DC to SNF today.    FIM: FIM - Bathing Bathing Steps Patient Completed: Chest;Right Arm;Left Arm;Abdomen;Right upper leg;Left upper leg;Front perineal area Bathing: 3: Mod-Patient completes 5-7 35f10 parts or 50-74%  FIM - Upper Body Dressing/Undressing Upper body dressing/undressing steps patient completed: Thread/unthread right sleeve of pullover shirt/dresss;Thread/unthread left sleeve of pullover shirt/dress;Put head through opening of pull over shirt/dress;Pull shirt over trunk Upper body dressing/undressing: 5: Supervision: Safety issues/verbal cues FIM - Lower Body Dressing/Undressing Lower body dressing/undressing steps patient completed: Thread/unthread left pants leg;Thread/unthread right pants leg Lower body dressing/undressing: 1: Total-Patient completed less than 25% of tasks  FIM - Toileting Toileting steps completed by patient: Adjust clothing prior to toileting;Performs perineal hygiene;Adjust clothing after toileting Toileting Assistive Devices: Grab bar or rail for support Toileting: 1: Total-Patient completed zero steps, helper did all 3  FIM - TRadio producerDevices: BRecruitment consultantTransfers: 4-To toilet/BSC: Min A (steadying Pt. > 75%);4-From toilet/BSC: Min A (steadying Pt. > 75%)  FIM - Bed/Chair Transfer Bed/Chair Transfer Assistive Devices: Bed rails;Arm rests Bed/Chair Transfer: 4: Sit > Supine: Min A (steadying pt. > 75%/lift 1 leg);4: Bed > Chair or W/C: Min A (steadying Pt. > 75%);4: Chair or W/C > Bed: Min A (steadying Pt. > 75%)  FIM - Locomotion: Wheelchair Distance: 75 Locomotion: Wheelchair: 1: Total Assistance/staff pushes wheelchair (Pt<25%) FIM - Locomotion: Ambulation Locomotion: Ambulation Assistive Devices: WAdministratorAmbulation/Gait Assistance: 4: Min assist Locomotion: Ambulation: 1: Travels less than 50 ft with minimal  assistance (Pt.>75%)  Comprehension Comprehension Mode: Auditory Comprehension: 5-Follows basic conversation/direction: With extra time/assistive device  Expression Expression Mode: Verbal Expression: 4-Expresses basic 75 - 89% of the time/requires cueing 10 - 24% of the time. Needs helper to occlude trach/needs to repeat words.  Social Interaction Social Interaction: 3-Interacts appropriately 50 - 74% of the time - May be physically or verbally inappropriate.  Problem Solving Problem Solving: 3-Solves basic 50 - 74% of the time/requires cueing 25 - 49% of the time  Memory Memory: 3-Recognizes or recalls 50 - 74% of the time/requires cueing 25 - 49% of the time  Medical Problem List and Plan:  1. 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  2. DVT Prophylaxis/Anticoagulation: Mechanical: Antiembolism stockings, thigh (TED hose) Bilateral lower extremities  Sequential compression devices, below knee Bilateral lower extremities  3. Pain Management: lidoderm patches for back.   -utilize, tylenol, ice, heat--   -  LOW dose scheduled hydrocodone at 7am and 12 noon each day to coincide with therapy History of migraines, no prophyllaxis- not relieved by tylenol, trial topamax qhs , monitor sedation 4. H/o depression/Mood: Resume lexapro. LCSW to follow for evaluation and support.  5. Neuropsych: This patient is not currently capable of making decisions on her own behalf.  6. ESRD: Continue HD on TTS after therapy sessions.  7. 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 Inconsistent PO   -fair control at present 8. CAD: Continue plavix, lipitor, lopressor and ranexa.  9. Leucocytosis: Monitor for signs of infection. WBC 11.3 10. HTN:  norvasc  551mdaily---increase to 1084m  - Prn po clonidine in place.  11. Acute on chronic anemia: On aranesp.  Transfusion  with dialysis prn. Stable/improving 12. AMS: baseline dementia. Episodic confusion persists     LOS (Days) 27 A FACE TO FACE EVALUATION WAS PERFORMED  SWARTZ,ZACHARY T 08/15/2013, 8:37 AM

## 2013-08-15 NOTE — Progress Notes (Signed)
Patient discharged to [facility] via ambulance.

## 2013-08-15 NOTE — Discharge Instructions (Signed)
Inpatient Rehab Discharge Instructions  Shanell Aden Discharge date and time:  08/15/13  Activities/Precautions/ Functional Status: Activity: activity as tolerated Diet: renal diet Wound Care: keep wound clean and dry  Functional status:  ___ No restrictions     ___ Walk up steps independently _X__ 24/7 supervision/assistance   ___ Walk up steps with assistance ___ Intermittent supervision/assistance  ___ Bathe/dress independently ___ Walk with walker     ___ Bathe/dress with assistance ___ Walk Independently    ___ Shower independently ___ Walk with assistance    ___ Shower with assistance ___ No alcohol     ___ Return to work/school ________  Special Instructions: Wear brace when out of bed.    My questions have been answered and I understand these instructions. I will adhere to these goals and the provided educational materials after my discharge from the hospital.  Patient/Caregiver Signature _______________________________ Date __________  Clinician Signature _______________________________________ Date __________  Please bring this form and your medication list with you to all your follow-up doctor's appointments.

## 2013-08-15 NOTE — Procedures (Signed)
Tolerating HD. Hemodynamically stable. For dc today. Holly Ellison

## 2013-08-15 NOTE — Progress Notes (Signed)
Rhonda from the OR called inquiring about an order for an MRI for the patient.  Algis Liming, PA notified and clarified that the patient refused the MRI yesterday and that the MRI will be rescheduled as an outpatient procedure since the patient is being discharged today.  Will continue to monitor.

## 2013-08-15 NOTE — Progress Notes (Signed)
Social Work  Discharge Note  The overall goal for the admission was met for:   Discharge location: NO - plan changed to SNF  Length of Stay: Yes  Discharge activity level: No most goals downgraded from original goals  Home/community participation: No  Services provided included: MD, RD, PT, OT, RN, TR, Pharmacy, Neuropsych and SW  Financial Services: Medicare  Follow-up services arranged: Other: SNF at [facility]  Comments (or additional information):  Patient/Family verbalized understanding of follow-up arrangements: Yes  Individual responsible for coordination of the follow-up plan: patient and son  Confirmed correct DME delivered: NA    Holly Ellison

## 2013-12-06 ENCOUNTER — Emergency Department (HOSPITAL_COMMUNITY): Payer: Medicare Other

## 2013-12-06 ENCOUNTER — Encounter (HOSPITAL_COMMUNITY): Payer: Self-pay | Admitting: Emergency Medicine

## 2013-12-06 ENCOUNTER — Inpatient Hospital Stay (HOSPITAL_COMMUNITY)
Admission: EM | Admit: 2013-12-06 | Discharge: 2013-12-19 | DRG: 477 | Disposition: A | Discharge status: Skilled Nursing Facility | Payer: Medicare Other | Attending: Internal Medicine | Admitting: Internal Medicine

## 2013-12-06 DIAGNOSIS — B9561 Methicillin susceptible Staphylococcus aureus infection as the cause of diseases classified elsewhere: Secondary | ICD-10-CM | POA: Diagnosis present

## 2013-12-06 DIAGNOSIS — K089 Disorder of teeth and supporting structures, unspecified: Secondary | ICD-10-CM | POA: Diagnosis present

## 2013-12-06 DIAGNOSIS — J910 Malignant pleural effusion: Secondary | ICD-10-CM

## 2013-12-06 DIAGNOSIS — K219 Gastro-esophageal reflux disease without esophagitis: Secondary | ICD-10-CM | POA: Diagnosis present

## 2013-12-06 DIAGNOSIS — R45851 Suicidal ideations: Secondary | ICD-10-CM | POA: Diagnosis present

## 2013-12-06 DIAGNOSIS — T380X5A Adverse effect of glucocorticoids and synthetic analogues, initial encounter: Secondary | ICD-10-CM | POA: Diagnosis present

## 2013-12-06 DIAGNOSIS — Z992 Dependence on renal dialysis: Secondary | ICD-10-CM | POA: Diagnosis not present

## 2013-12-06 DIAGNOSIS — N2581 Secondary hyperparathyroidism of renal origin: Secondary | ICD-10-CM | POA: Diagnosis present

## 2013-12-06 DIAGNOSIS — G8929 Other chronic pain: Secondary | ICD-10-CM | POA: Diagnosis present

## 2013-12-06 DIAGNOSIS — Z794 Long term (current) use of insulin: Secondary | ICD-10-CM

## 2013-12-06 DIAGNOSIS — G8220 Paraplegia, unspecified: Secondary | ICD-10-CM | POA: Diagnosis present

## 2013-12-06 DIAGNOSIS — Z833 Family history of diabetes mellitus: Secondary | ICD-10-CM

## 2013-12-06 DIAGNOSIS — D631 Anemia in chronic kidney disease: Secondary | ICD-10-CM | POA: Diagnosis present

## 2013-12-06 DIAGNOSIS — R400 Somnolence: Secondary | ICD-10-CM | POA: Diagnosis present

## 2013-12-06 DIAGNOSIS — C801 Malignant (primary) neoplasm, unspecified: Secondary | ICD-10-CM

## 2013-12-06 DIAGNOSIS — D72829 Elevated white blood cell count, unspecified: Secondary | ICD-10-CM | POA: Diagnosis present

## 2013-12-06 DIAGNOSIS — Z9119 Patient's noncompliance with other medical treatment and regimen: Secondary | ICD-10-CM | POA: Diagnosis present

## 2013-12-06 DIAGNOSIS — J449 Chronic obstructive pulmonary disease, unspecified: Secondary | ICD-10-CM | POA: Diagnosis present

## 2013-12-06 DIAGNOSIS — I120 Hypertensive chronic kidney disease with stage 5 chronic kidney disease or end stage renal disease: Secondary | ICD-10-CM | POA: Diagnosis present

## 2013-12-06 DIAGNOSIS — I10 Essential (primary) hypertension: Secondary | ICD-10-CM | POA: Diagnosis present

## 2013-12-06 DIAGNOSIS — R634 Abnormal weight loss: Secondary | ICD-10-CM | POA: Diagnosis present

## 2013-12-06 DIAGNOSIS — M4854XS Collapsed vertebra, not elsewhere classified, thoracic region, sequela of fracture: Principal | ICD-10-CM | POA: Diagnosis present

## 2013-12-06 DIAGNOSIS — G9529 Other cord compression: Secondary | ICD-10-CM | POA: Diagnosis present

## 2013-12-06 DIAGNOSIS — D649 Anemia, unspecified: Secondary | ICD-10-CM | POA: Diagnosis present

## 2013-12-06 DIAGNOSIS — J9601 Acute respiratory failure with hypoxia: Secondary | ICD-10-CM | POA: Diagnosis present

## 2013-12-06 DIAGNOSIS — Z955 Presence of coronary angioplasty implant and graft: Secondary | ICD-10-CM | POA: Diagnosis not present

## 2013-12-06 DIAGNOSIS — Z6823 Body mass index (BMI) 23.0-23.9, adult: Secondary | ICD-10-CM

## 2013-12-06 DIAGNOSIS — E1140 Type 2 diabetes mellitus with diabetic neuropathy, unspecified: Secondary | ICD-10-CM | POA: Diagnosis present

## 2013-12-06 DIAGNOSIS — G9520 Unspecified cord compression: Secondary | ICD-10-CM | POA: Diagnosis present

## 2013-12-06 DIAGNOSIS — Z515 Encounter for palliative care: Secondary | ICD-10-CM | POA: Diagnosis not present

## 2013-12-06 DIAGNOSIS — I252 Old myocardial infarction: Secondary | ICD-10-CM

## 2013-12-06 DIAGNOSIS — K5900 Constipation, unspecified: Secondary | ICD-10-CM | POA: Diagnosis present

## 2013-12-06 DIAGNOSIS — N189 Chronic kidney disease, unspecified: Secondary | ICD-10-CM | POA: Diagnosis present

## 2013-12-06 DIAGNOSIS — I25810 Atherosclerosis of coronary artery bypass graft(s) without angina pectoris: Secondary | ICD-10-CM | POA: Diagnosis present

## 2013-12-06 DIAGNOSIS — G9340 Encephalopathy, unspecified: Secondary | ICD-10-CM | POA: Diagnosis present

## 2013-12-06 DIAGNOSIS — Z7982 Long term (current) use of aspirin: Secondary | ICD-10-CM | POA: Diagnosis not present

## 2013-12-06 DIAGNOSIS — E11649 Type 2 diabetes mellitus with hypoglycemia without coma: Secondary | ICD-10-CM | POA: Diagnosis present

## 2013-12-06 DIAGNOSIS — E0590 Thyrotoxicosis, unspecified without thyrotoxic crisis or storm: Secondary | ICD-10-CM | POA: Diagnosis present

## 2013-12-06 DIAGNOSIS — S24109A Unspecified injury at unspecified level of thoracic spinal cord, initial encounter: Secondary | ICD-10-CM

## 2013-12-06 DIAGNOSIS — R7881 Bacteremia: Secondary | ICD-10-CM | POA: Insufficient documentation

## 2013-12-06 DIAGNOSIS — I2510 Atherosclerotic heart disease of native coronary artery without angina pectoris: Secondary | ICD-10-CM | POA: Diagnosis present

## 2013-12-06 DIAGNOSIS — Z87891 Personal history of nicotine dependence: Secondary | ICD-10-CM | POA: Diagnosis not present

## 2013-12-06 DIAGNOSIS — R0602 Shortness of breath: Secondary | ICD-10-CM

## 2013-12-06 DIAGNOSIS — N186 End stage renal disease: Secondary | ICD-10-CM | POA: Diagnosis present

## 2013-12-06 DIAGNOSIS — M549 Dorsalgia, unspecified: Secondary | ICD-10-CM | POA: Insufficient documentation

## 2013-12-06 DIAGNOSIS — F329 Major depressive disorder, single episode, unspecified: Secondary | ICD-10-CM | POA: Diagnosis present

## 2013-12-06 DIAGNOSIS — J90 Pleural effusion, not elsewhere classified: Secondary | ICD-10-CM | POA: Diagnosis present

## 2013-12-06 DIAGNOSIS — R0902 Hypoxemia: Secondary | ICD-10-CM | POA: Diagnosis present

## 2013-12-06 DIAGNOSIS — Z9115 Patient's noncompliance with renal dialysis: Secondary | ICD-10-CM | POA: Diagnosis present

## 2013-12-06 DIAGNOSIS — I7025 Atherosclerosis of native arteries of other extremities with ulceration: Secondary | ICD-10-CM | POA: Diagnosis present

## 2013-12-06 DIAGNOSIS — R410 Disorientation, unspecified: Secondary | ICD-10-CM | POA: Diagnosis not present

## 2013-12-06 DIAGNOSIS — E119 Type 2 diabetes mellitus without complications: Secondary | ICD-10-CM

## 2013-12-06 DIAGNOSIS — E1122 Type 2 diabetes mellitus with diabetic chronic kidney disease: Secondary | ICD-10-CM | POA: Diagnosis present

## 2013-12-06 DIAGNOSIS — S22009A Unspecified fracture of unspecified thoracic vertebra, initial encounter for closed fracture: Secondary | ICD-10-CM | POA: Diagnosis present

## 2013-12-06 DIAGNOSIS — R4182 Altered mental status, unspecified: Secondary | ICD-10-CM | POA: Diagnosis present

## 2013-12-06 DIAGNOSIS — R52 Pain, unspecified: Secondary | ICD-10-CM | POA: Diagnosis present

## 2013-12-06 DIAGNOSIS — L8990 Pressure ulcer of unspecified site, unspecified stage: Secondary | ICD-10-CM | POA: Diagnosis present

## 2013-12-06 LAB — COMPREHENSIVE METABOLIC PANEL
ALBUMIN: 2.2 g/dL — AB (ref 3.5–5.2)
ALT: 23 U/L (ref 0–35)
ANION GAP: 13 (ref 5–15)
AST: 29 U/L (ref 0–37)
Alkaline Phosphatase: 155 U/L — ABNORMAL HIGH (ref 39–117)
BILIRUBIN TOTAL: 0.5 mg/dL (ref 0.3–1.2)
BUN: 22 mg/dL (ref 6–23)
CHLORIDE: 94 meq/L — AB (ref 96–112)
CO2: 32 mEq/L (ref 19–32)
CREATININE: 2.85 mg/dL — AB (ref 0.50–1.10)
Calcium: 9.3 mg/dL (ref 8.4–10.5)
GFR calc non Af Amer: 16 mL/min — ABNORMAL LOW (ref >90)
GFR, EST AFRICAN AMERICAN: 18 mL/min — AB (ref >90)
GLUCOSE: 77 mg/dL (ref 70–99)
POTASSIUM: 3.7 meq/L (ref 3.7–5.3)
Sodium: 139 mEq/L (ref 137–147)
TOTAL PROTEIN: 7.6 g/dL (ref 6.0–8.3)

## 2013-12-06 LAB — CBC
HEMATOCRIT: 33 % — AB (ref 36.0–46.0)
HEMOGLOBIN: 10.5 g/dL — AB (ref 12.0–15.0)
MCH: 31.1 pg (ref 26.0–34.0)
MCHC: 31.8 g/dL (ref 30.0–36.0)
MCV: 97.6 fL (ref 78.0–100.0)
Platelets: 496 10*3/uL — ABNORMAL HIGH (ref 150–400)
RBC: 3.38 MIL/uL — ABNORMAL LOW (ref 3.87–5.11)
RDW: 17.7 % — ABNORMAL HIGH (ref 11.5–15.5)
WBC: 13.5 10*3/uL — ABNORMAL HIGH (ref 4.0–10.5)

## 2013-12-06 LAB — I-STAT CHEM 8, ED
BUN: 22 mg/dL (ref 6–23)
CREATININE: 2.9 mg/dL — AB (ref 0.50–1.10)
Calcium, Ion: 1.1 mmol/L — ABNORMAL LOW (ref 1.13–1.30)
Chloride: 93 mEq/L — ABNORMAL LOW (ref 96–112)
GLUCOSE: 78 mg/dL (ref 70–99)
HCT: 37 % (ref 36.0–46.0)
HEMOGLOBIN: 12.6 g/dL (ref 12.0–15.0)
Potassium: 3.4 mEq/L — ABNORMAL LOW (ref 3.7–5.3)
Sodium: 136 mEq/L — ABNORMAL LOW (ref 137–147)
TCO2: 33 mmol/L (ref 0–100)

## 2013-12-06 LAB — CBG MONITORING, ED: Glucose-Capillary: 88 mg/dL (ref 70–99)

## 2013-12-06 LAB — I-STAT TROPONIN, ED: Troponin i, poc: 0.1 ng/mL (ref 0.00–0.08)

## 2013-12-06 LAB — I-STAT CG4 LACTIC ACID, ED: Lactic Acid, Venous: 2.02 mmol/L (ref 0.5–2.2)

## 2013-12-06 LAB — PROTIME-INR
INR: 1.23 (ref 0.00–1.49)
PROTHROMBIN TIME: 15.6 s — AB (ref 11.6–15.2)

## 2013-12-06 LAB — TROPONIN I: Troponin I: <0.3 ng/mL (ref <0.30)

## 2013-12-06 MED ORDER — FENTANYL CITRATE 0.05 MG/ML IJ SOLN
INTRAMUSCULAR | Status: AC
  Administered 2013-12-06: 50 ug via INTRAVENOUS
  Filled 2013-12-06: qty 2

## 2013-12-06 MED ORDER — FENTANYL CITRATE 0.05 MG/ML IJ SOLN
50.0000 ug | Freq: Once | INTRAMUSCULAR | Status: AC
  Administered 2013-12-06: 50 ug via INTRAVENOUS

## 2013-12-06 MED ORDER — HYDROMORPHONE HCL 1 MG/ML IJ SOLN
1.0000 mg | Freq: Once | INTRAMUSCULAR | Status: AC
Start: 2013-12-06 — End: 2013-12-06
  Administered 2013-12-06: 1 mg via INTRAVENOUS
  Filled 2013-12-06: qty 1

## 2013-12-06 MED ORDER — DIPHENHYDRAMINE HCL 50 MG/ML IJ SOLN
25.0000 mg | Freq: Once | INTRAMUSCULAR | Status: AC
  Administered 2013-12-06: 25 mg via INTRAVENOUS
  Filled 2013-12-06: qty 1

## 2013-12-06 MED ORDER — FENTANYL CITRATE 0.05 MG/ML IJ SOLN
50.0000 ug | Freq: Once | INTRAMUSCULAR | Status: AC
  Administered 2013-12-06: 50 ug via INTRAVENOUS
  Filled 2013-12-06: qty 2

## 2013-12-06 NOTE — ED Provider Notes (Signed)
73 year old female was sent here from MRI because of altered mentation. She had received medication of fentanyl and midazolam for sedation. She was reported to be hypoxic, although she has normal oxygen levels here. She is being evaluated for a thoracic spine mass. Chest x-ray shows a pleural effusion effusion worrisome for malignant effusion. On exam, she is awake but slow to answer questions. She is objectively oriented. She has paraplegia. Troponin I has come back borderline elevated however she is a renal failure patient and this is not unexpected. This is a point-of-care test and will be confirmed by main lab troponin. Chest x-ray shows large left-sided pleural effusion. No surgery has already evaluated the patient and do not feel that they have anything to offer the patient. 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  I saw and evaluated the patient, reviewed the resident's note and I agree with the findings and plan.   EKG Interpretation   Date/Time:  Friday December 06 2013 16:14:27 EDT Ventricular Rate:  92 PR Interval:  171 QRS Duration: 91 QT Interval:  399 QTC Calculation: 494 R Axis:   53 Text Interpretation:  Sinus rhythm Probable left atrial enlargement Left  ventricular hypertrophy ST elevation, consider anterior injury Borderline  prolonged QT interval When compared with ECG of 10/09/2012, No significant  change was found Confirmed by [facility]  MD, Deontrey Massi ([id number]) on 12/06/2013  4:32:39 PM        Delora Fuel, MD 12/07/57 [id number]

## 2013-12-06 NOTE — ED Notes (Signed)
  CBG 88

## 2013-12-06 NOTE — ED Notes (Signed)
Admitting md at bedside

## 2013-12-06 NOTE — H&P (Signed)
PCP:  [name], MD    Chief Complaint:  Altered mental status  HPI: Holly Ellison is a 73 y.o. female   has a past medical history of Chronic kidney disease; Arthritis; Neuropathy in diabetes; Insomnia; Asthma; CAD (coronary artery disease) (2012); GERD (gastroesophageal reflux disease); Diabetes mellitus; COPD (chronic obstructive pulmonary disease); Hypertension; Peripheral vascular disease; Anemia; CHF (congestive heart failure); Coronary artery dilation; Myocardial infarction (2013); Colloid cyst of brain; H/O Bell's palsy; Frequent headaches; and Orthostasis.   Presented with  Patient has long-standing history of end-stage renal disease with poor compliance and her dialysis schedule, diabetes, coronary artery disease. Per history she has had unknown duration of paraplegia secondary to unknown mass lesion that causing spinal cord compression status post decompressive laminectomy now had progressive worsening weakness had done an outpatient MRI scan showed a progressive pathologic fracture and collapse as well as worsening cord compression and cord edema. Patient was noted to be hypoxic while an MRI and was transferred to [facility] ER. Patient has been poorly responsive answering with mumbling to all portions and not following commands. It is unclear what her baseline is. No family is at bedside. Discussed her care with nephrology who is aware. Plain chest x-ray showed large pleural effusion. Nephrology was see in the morning and decide if she can have CT of the chest with contrast to evaluate this better with follow-up thoracentesis.     Hospitalist was called for admission for large pleural effusion altered mental status unknown baseline  Review of Systems:  Unable to obtain secondary to confusion not answering questions Past Medical History: Past Medical History  Diagnosis Date  . Chronic kidney disease     just found out? Placed graft in [facility]-Dr. Tarri Glenn in [facility].   started dialysis in 06/2011  . Arthritis     Osteoarthritis  . Neuropathy in diabetes   . Insomnia   . Asthma   . CAD (coronary artery disease) 2012    7 stents-First PCI was in [place].  The last time she had a procedure was  a stent last year, thena  CABG was undertaken in October [facility])  . GERD (gastroesophageal reflux disease)   . Diabetes mellitus     Type II  . COPD (chronic obstructive pulmonary disease)   . Hypertension   . Peripheral vascular disease     endarterectomy  . Anemia   . CHF (congestive heart failure)   . Coronary artery dilation   . Myocardial infarction 2013    x 3  . 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 H/O Bell's palsy   . Frequent headaches   . Orthostasis    Past Surgical History  Procedure Laterality Date  . Abdominal hysterectomy    . Coronary artery bypass graft  2012    7 stents  . Cea      Left  . Carpal tunnel release    . Av fistula placement    . Grave's disease      ? unclea rif ca or not   . Laminectomy N/A 07/16/2013    Procedure: THORACIC LAMINECTOMY FOR TUMOR;  Surgeon: Erline Levine, MD;  Location: [facility];  Service: Neurosurgery;  Laterality: N/A;     Medications: Prior to Admission medications   Medication Sig Start Date End Date Taking? Authorizing Provider  acetaminophen (TYLENOL) 325 MG tablet Take 1-2 tablets (325-650 mg total) by mouth every 4 (four) hours as needed for  mild pain. 08/14/13   Bary Leriche, PA-C  albuterol (PROVENTIL HFA;VENTOLIN HFA) 108 (90 BASE) MCG/ACT inhaler Inhale 2 puffs into the lungs every 6 (six) hours as needed for wheezing.    Historical Provider, MD  ALPRAZolam Duanne Moron) 0.25 MG tablet Take 1 tablet (0.25 mg total) by mouth 3 (three) times daily as needed for anxiety. 08/14/13   Ivan Anchors Love, PA-C  amLODipine (NORVASC) 5 MG tablet Take 1 tablet (5 mg total) by mouth daily. 08/14/13   Bary Leriche, PA-C  aspirin EC 81 MG EC tablet Take 1 tablet (81 mg total) by mouth daily.  08/14/13   Ivan Anchors Love, PA-C  atorvastatin (LIPITOR) 40 MG tablet Take 40 mg by mouth at bedtime.     Historical Provider, MD  b complex-vitamin c-folic acid (NEPHRO-VITE) 0.8 MG TABS tablet Take 1 tablet by mouth daily.    Historical Provider, MD  bisacodyl (DULCOLAX) 10 MG suppository Place 1 suppository (10 mg total) rectally daily at 6 (six) AM. 08/14/13   Ivan Anchors Love, PA-C  calcium carbonate (TUMS EX) 750 MG chewable tablet Chew 1 tablet (750 mg total) by mouth 3 (three) times daily as needed for heartburn. 08/14/13   Bary Leriche, PA-C  clopidogrel (PLAVIX) 75 MG tablet Take 1 tablet (75 mg total) by mouth daily. 11/02/11   Monika Salk, MD  docusate sodium 100 MG CAPS Take 100 mg by mouth 2 (two) times daily. 08/14/13   Bary Leriche, PA-C  feeding supplement, RESOURCE BREEZE, (RESOURCE BREEZE) LIQD Take 1 Container by mouth 3 (three) times daily between meals. 08/14/13   Bary Leriche, PA-C  HYDROcodone-acetaminophen (NORCO/VICODIN) 5-325 MG per tablet Take 1 tablet by mouth 2 (two) times daily. 08/14/13   Ivan Anchors Love, PA-C  insulin aspart (NOVOLOG) 100 unit/mL injection Inject 0-4 Units into the skin 3 (three) times daily before meals. Sliding scale: No units if <120, 3 units if 120-150, 4 if 150-180    Historical Provider, MD  insulin glargine (LANTUS) 100 UNIT/ML injection Inject 0.1 mLs (10 Units total) into the skin daily. 08/14/13   Ivan Anchors Love, PA-C  lanthanum (FOSRENOL) 1000 MG chewable tablet Chew 1 tablet (1,000 mg total) by mouth 3 (three) times daily with meals. 08/14/13   Ivan Anchors Love, PA-C  lidocaine (LIDODERM) 5 % Place 2 patches onto the skin daily. Remove & Discard patch within 12 hours or as directed by MD 08/14/13   Ivan Anchors Love, PA-C  lidocaine (XYLOCAINE) 5 % ointment daily as needed for mild pain (due to dialysis).  01/21/13   Historical Provider, MD  methimazole (TAPAZOLE) 10 MG tablet Take 10 mg by mouth 3 (three) times daily.     Historical Provider, MD  metoprolol tartrate  (LOPRESSOR) 25 MG tablet Take 1 tablet (25 mg total) by mouth 2 (two) times daily. 08/14/13   Ivan Anchors Love, PA-C  pantoprazole (PROTONIX) 40 MG tablet Take 40 mg by mouth daily.    Historical Provider, MD  ranolazine (RANEXA) 500 MG 12 hr tablet Take 1 tablet (500 mg total) by mouth 2 (two) times daily. 08/14/13   Bary Leriche, PA-C  topiramate (TOPAMAX) 25 MG tablet Take 1 tablet (25 mg total) by mouth daily. 08/14/13   Bary Leriche, PA-C  traZODone (DESYREL) 50 MG tablet Take 1 tablet (50 mg total) by mouth at bedtime. 08/14/13   Bary Leriche, PA-C    Allergies:   Allergies  Allergen Reactions  .  Dilaudid [Hydromorphone Hcl] Itching  . Morphine Sulfate Itching  . Opium Itching  . Ciprofloxacin Hcl Itching  . Lexapro [Escitalopram Oxalate]     Mental status changes/lethargy  . Procainamide Hcl     Unknown reaction  . Robaxin [Methocarbamol]     lethargy    Social History:    reports that she quit smoking about 17 years ago. Her smoking use included Cigarettes. She smoked 0.00 packs per day. She has never used smokeless tobacco. She reports that she does not drink alcohol or use illicit drugs.    Family History: family history includes Diabetes type II in an other family member.    Physical Exam: Patient Vitals for the past 24 hrs:  BP Temp Temp src Pulse Resp SpO2 Height Weight  12/06/13 2300 143/66 mmHg - - 90 11 100 % - -  12/06/13 2230 161/67 mmHg - - 92 11 99 % - -  12/06/13 2215 162/68 mmHg - - 92 9 100 % - -  12/06/13 2130 135/63 mmHg - - 94 12 100 % - -  12/06/13 2100 146/69 mmHg - - 95 9 100 % - -  12/06/13 2030 162/69 mmHg - - 97 16 100 % - -  12/06/13 2007 152/74 mmHg - - 95 20 100 % - -  12/06/13 1930 167/73 mmHg - - 96 16 98 % - -  12/06/13 1900 178/74 mmHg - - 95 19 100 % - -  12/06/13 1845 - - - 95 25 98 % - -  12/06/13 1844 175/81 mmHg - - 95 23 100 % - -  12/06/13 1830 175/81 mmHg - - - 21 - - -  12/06/13 1800 162/73 mmHg - - 93 16 96 % - -  12/06/13 1753  176/75 mmHg - - 95 16 95 % - -  12/06/13 1730 163/74 mmHg - - 94 25 100 % - -  12/06/13 1715 164/72 mmHg - - 49 21 98 % - -  12/06/13 1700 148/72 mmHg - - 94 16 97 % - -  12/06/13 1645 150/63 mmHg - - 93 20 98 % - -  12/06/13 1630 158/67 mmHg - - - 23 - - -  12/06/13 1615 141/61 mmHg - - - 20 - - -  12/06/13 1609 133/67 mmHg 97.6 F (36.4 C) Oral 93 19 88 % 5\' 7"  (1.702 m) 68.04 kg (150 lb)  12/06/13 1602 - - - - - 83 % - -    1. General:  in No Acute distress 2. Psychological: Somnolent not Oriented unable to provide any history 3. Head/ENT:   Moist  Mucous Membranes                          Head Non traumatic, neck supple                          Normal   Dentition 4. SKIN: decreased Skin turgor,  Skin clean Dry and intact no rash 5. Heart: Regular rate and rhythm no Murmur, Rub or gallop 6. Lungs: Clear to auscultation bilaterally, no wheezes or crackles   7. Abdomen: Soft, non-tender, Non distended 8. Lower extremities: no clubbing, cyanosis, or edema 9. Neurologically densely flaccid paralysis of the lower extremity bilaterally   10. MSK: Normal range of motion  body mass index is 23.49 kg/(m^2).   Labs on Admission:   Results for orders placed during the hospital  encounter of 12/06/13 (from the past 24 hour(s))  CBG MONITORING, ED     Status: None   Collection Time    12/06/13  4:07 PM      Result Value Ref Range   Glucose-Capillary 88  70 - 99 mg/dL   Comment 1 Documented in Chart     Comment 2 Notify RN    CBC     Status: Abnormal   Collection Time    12/06/13  4:17 PM      Result Value Ref Range   WBC 13.5 (*) 4.0 - 10.5 K/uL   RBC 3.38 (*) 3.87 - 5.11 MIL/uL   Hemoglobin 10.5 (*) 12.0 - 15.0 g/dL   HCT 33.0 (*) 36.0 - 46.0 %   MCV 97.6  78.0 - 100.0 fL   MCH 31.1  26.0 - 34.0 pg   MCHC 31.8  30.0 - 36.0 g/dL   RDW 17.7 (*) 11.5 - 15.5 %   Platelets 496 (*) 150 - 400 K/uL  COMPREHENSIVE METABOLIC PANEL     Status: Abnormal   Collection Time    12/06/13   4:17 PM      Result Value Ref Range   Sodium 139  137 - 147 mEq/L   Potassium 3.7  3.7 - 5.3 mEq/L   Chloride 94 (*) 96 - 112 mEq/L   CO2 32  19 - 32 mEq/L   Glucose, Bld 77  70 - 99 mg/dL   BUN 22  6 - 23 mg/dL   Creatinine, Ser 2.85 (*) 0.50 - 1.10 mg/dL   Calcium 9.3  8.4 - 10.5 mg/dL   Total Protein 7.6  6.0 - 8.3 g/dL   Albumin 2.2 (*) 3.5 - 5.2 g/dL   AST 29  0 - 37 U/L   ALT 23  0 - 35 U/L   Alkaline Phosphatase 155 (*) 39 - 117 U/L   Total Bilirubin 0.5  0.3 - 1.2 mg/dL   GFR calc non Af Amer 16 (*) >90 mL/min   GFR calc Af Amer 18 (*) >90 mL/min   Anion gap 13  5 - 15  PROTIME-INR     Status: Abnormal   Collection Time    12/06/13  4:17 PM      Result Value Ref Range   Prothrombin Time 15.6 (*) 11.6 - 15.2 seconds   INR 1.23  0.00 - 1.49  I-STAT TROPOININ, ED     Status: Abnormal   Collection Time    12/06/13  4:23 PM      Result Value Ref Range   Troponin i, poc 0.10 (*) 0.00 - 0.08 ng/mL   Comment NOTIFIED PHYSICIAN     Comment 3           I-STAT CHEM 8, ED     Status: Abnormal   Collection Time    12/06/13  4:24 PM      Result Value Ref Range   Sodium 136 (*) 137 - 147 mEq/L   Potassium 3.4 (*) 3.7 - 5.3 mEq/L   Chloride 93 (*) 96 - 112 mEq/L   BUN 22  6 - 23 mg/dL   Creatinine, Ser 2.90 (*) 0.50 - 1.10 mg/dL   Glucose, Bld 78  70 - 99 mg/dL   Calcium, Ion 1.10 (*) 1.13 - 1.30 mmol/L   TCO2 33  0 - 100 mmol/L   Hemoglobin 12.6  12.0 - 15.0 g/dL   HCT 37.0  36.0 - 46.0 %  I-STAT CG4 LACTIC  ACID, ED     Status: None   Collection Time    12/06/13  4:25 PM      Result Value Ref Range   Lactic Acid, Venous 2.02  0.5 - 2.2 mmol/L  TROPONIN I     Status: None   Collection Time    12/06/13  5:13 PM      Result Value Ref Range   Troponin I <0.30  <0.30 ng/mL    Lab Results  Component Value Date   HGBA1C 6.2* 07/17/2013    Estimated Creatinine Clearance: 17.3 ml/min (by C-G formula based on Cr of 2.9).  BNP (last 3 results) No results found for this  basename: PROBNP,  in the last 8760 hours  Other results:  Filed Weights   12/06/13 1609  Weight: 68.04 kg (150 lb)   Cultures:    Component Value Date/Time   SDES BLOOD THUMB LEFT 07/17/2013 1110   SPECREQUEST BOTTLES DRAWN AEROBIC ONLY 6CC 07/17/2013 1110   CULT  Value: NO GROWTH 5 DAYS Performed at [facility] 07/17/2013 1110   REPTSTATUS 07/23/2013 FINAL 07/17/2013 1110     Radiological Exams on Admission: Ct Head Wo Contrast  12/06/2013   CLINICAL DATA:  Altered mental status.  Headache.  EXAM: CT HEAD WITHOUT CONTRAST  TECHNIQUE: Contiguous axial images were obtained from the base of the skull through the vertex without intravenous contrast.  COMPARISON:  11/04/2012  FINDINGS: There is no evidence of intracranial hemorrhage, brain edema, or other signs of acute infarction. There is no evidence of intracranial mass lesion or mass effect. No abnormal extraaxial fluid collections are identified.  Generalized ventriculomegaly remains stable. No other intracranial abnormality identified. Old right frontal craniotomy or ventriculostomy defect again noted.  IMPRESSION: No acute intracranial abnormality. Stable ventriculomegaly and old right frontal craniotomy/ventriculostomy defect.   Electronically Signed   By: Earle Gell M.D.   On: 12/06/2013 18:15   Dg Chest Portable 1 View  12/06/2013   CLINICAL DATA:  Initial encounter for 1 day history of respiratory distress  EXAM: PORTABLE CHEST - 1 VIEW  COMPARISON:  Chest x-ray from 04/02/2013.  Chest CT from 07/17/2013.  FINDINGS: 1624 hrs. There is a large right pleural effusion with diffuse airspace disease within the visualized portions of the right lung. No left-sided pleural effusion. No pulmonary edema in the left lung. Cardiopericardial silhouette is enlarged. Patient is status post CABG. Telemetry leads overlie the chest.  IMPRESSION: New large right-sided pleural effusion. Unilateral pleural effusion can be a sign of malignancy.    Electronically Signed   By: Misty Stanley M.D.   On: 12/06/2013 16:45    Chart has been reviewed  Assessment/Plan \  73 year old female with pathologic fractures involving T10-11 resulting in paraplegia.per  Neurosurgery nonoperative. History of end-stage renal disease noncompliance with dialysis, diabetes, coronary artery disease, here with hypoxia in the setting of pleural effusion.  Present on Admission:  . 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 Hypertension - continue medications  . Pleural effusion - will obtain father imaging CT of contrast if able per nephrology. We'll need pulmonary consult to  obtainthoracentesis  . Altered mental state - unsure what the patient's baseline is. We'll continue to monitor spoke to nephrology who feels that at baseline patient is poorly verbal no family at bedside End-stage renal disease - nephrology is aware  Prophylaxis: SCD, Protonix  CODE STATUS:  FULL CODE presumed to be  Other plan as per orders.  I have spent a total of 65 min on this admission, extra time was taken to discuss case with nephrology  [place] 12/06/2013, 11:40 PM  [facility]  Pager [phone]   after 2 AM please page floor coverage PA If 7AM-7PM, please contact the day team taking care of the patient  [email or web address]  Password [email or web address]

## 2013-12-06 NOTE — ED Notes (Signed)
Report to kelly, rn.  Pt care transferred

## 2013-12-06 NOTE — ED Notes (Signed)
Neurology at bedside.

## 2013-12-06 NOTE — ED Notes (Signed)
Back from CT

## 2013-12-06 NOTE — ED Provider Notes (Signed)
CSN: [id number]     Arrival date & time 12/06/13  1602 History   First MD Initiated Contact with Patient 12/06/13 1612     Chief Complaint  Patient presents with  . Altered Mental Status  . Respiratory Distress     (Consider location/radiation/quality/duration/timing/severity/associated sxs/prior Treatment) HPI Comments: 73 y/o female with h/o laminectomy for spinal mass with cord compression brought from outpatient imaging due to AMS and hypoxia that developed during imaging after receiving sedation.   Patient is a 73 y.o. female presenting with altered mental status. The history is provided by the patient. No language interpreter was used.  Altered Mental Status Presenting symptoms: disorientation and lethargy   Severity:  Severe Most recent episode:  Today Episode history:  Single Timing:  Constant Progression:  Unchanged Chronicity:  New Context comment:  Recieved fentanyl, versed for sedation during MRI Associated symptoms: weakness (bilateral LE chronic)   Associated symptoms: no abdominal pain, normal movement, no eye deviation, no fever, no headaches, no nausea, no seizures and no vomiting   Associated symptoms comment:  Back pain, hypoxia   Past Medical History  Diagnosis Date  . Chronic kidney disease     just found out? Placed graft in NOv-Dr. Tarri Glenn in [facility].  started dialysis in 06/2011  . Arthritis     Osteoarthritis  . Neuropathy in diabetes   . Insomnia   . Asthma   . CAD (coronary artery disease) 2012    7 stents-First PCI was in [place].  The last time she had a procedure was  a stent last year, thena  CABG was undertaken in October [facility])  . GERD (gastroesophageal reflux disease)   . Diabetes mellitus     Type II  . COPD (chronic obstructive pulmonary disease)   . Hypertension   . Peripheral vascular disease     endarterectomy  . Anemia   . CHF (congestive heart failure)   . Coronary artery dilation   . Myocardial infarction  2013    x 3  . 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 H/O Bell's palsy   . Frequent headaches   . Orthostasis    Past Surgical History  Procedure Laterality Date  . Abdominal hysterectomy    . Coronary artery bypass graft  2012    7 stents  . Cea      Left  . Carpal tunnel release    . Av fistula placement    . Grave's disease      ? unclea rif ca or not   . Laminectomy N/A 07/16/2013    Procedure: THORACIC LAMINECTOMY FOR TUMOR;  Surgeon: Erline Levine, MD;  Location: [facility];  Service: Neurosurgery;  Laterality: N/A;   Family History  Problem Relation Age of Onset  . Diabetes type II     History  Substance Use Topics  . Smoking status: Former Smoker    Types: Cigarettes    Quit date: 05/24/1996  . Smokeless tobacco: Never Used  . Alcohol Use: No   OB History   Grav Para Term Preterm Abortions TAB SAB Ect Mult Living                 Review of Systems  Constitutional: Negative for fever.  Eyes: Negative for visual disturbance.  Respiratory: Negative for chest tightness and shortness of breath.   Cardiovascular: Negative for chest pain.  Gastrointestinal: Negative for nausea, vomiting and abdominal pain.  Musculoskeletal: Positive for  back pain and gait problem (chronic).  Neurological: Positive for weakness (bilateral LE chronic). Negative for seizures, syncope, facial asymmetry, numbness and headaches.  All other systems reviewed and are negative.     Allergies  Dilaudid; Morphine sulfate; Opium; Ciprofloxacin hcl; Lexapro; Procainamide hcl; and Robaxin  Home Medications   Prior to Admission medications   Medication Sig Start Date End Date Taking? Authorizing Provider  acetaminophen (TYLENOL) 325 MG tablet Take 1-2 tablets (325-650 mg total) by mouth every 4 (four) hours as needed for mild pain. 08/14/13   Bary Leriche, PA-C  albuterol (PROVENTIL HFA;VENTOLIN HFA) 108 (90 BASE) MCG/ACT inhaler Inhale 2 puffs into the lungs every 6 (six) hours  as needed for wheezing.    Historical Provider, MD  ALPRAZolam Duanne Moron) 0.25 MG tablet Take 1 tablet (0.25 mg total) by mouth 3 (three) times daily as needed for anxiety. 08/14/13   Ivan Anchors Love, PA-C  amLODipine (NORVASC) 5 MG tablet Take 1 tablet (5 mg total) by mouth daily. 08/14/13   Bary Leriche, PA-C  aspirin EC 81 MG EC tablet Take 1 tablet (81 mg total) by mouth daily. 08/14/13   Ivan Anchors Love, PA-C  atorvastatin (LIPITOR) 40 MG tablet Take 40 mg by mouth at bedtime.     Historical Provider, MD  b complex-vitamin c-folic acid (NEPHRO-VITE) 0.8 MG TABS tablet Take 1 tablet by mouth daily.    Historical Provider, MD  bisacodyl (DULCOLAX) 10 MG suppository Place 1 suppository (10 mg total) rectally daily at 6 (six) AM. 08/14/13   Ivan Anchors Love, PA-C  calcium carbonate (TUMS EX) 750 MG chewable tablet Chew 1 tablet (750 mg total) by mouth 3 (three) times daily as needed for heartburn. 08/14/13   Bary Leriche, PA-C  clopidogrel (PLAVIX) 75 MG tablet Take 1 tablet (75 mg total) by mouth daily. 11/02/11   Monika Salk, MD  docusate sodium 100 MG CAPS Take 100 mg by mouth 2 (two) times daily. 08/14/13   Bary Leriche, PA-C  feeding supplement, RESOURCE BREEZE, (RESOURCE BREEZE) LIQD Take 1 Container by mouth 3 (three) times daily between meals. 08/14/13   Bary Leriche, PA-C  HYDROcodone-acetaminophen (NORCO/VICODIN) 5-325 MG per tablet Take 1 tablet by mouth 2 (two) times daily. 08/14/13   Ivan Anchors Love, PA-C  insulin aspart (NOVOLOG) 100 unit/mL injection Inject 0-4 Units into the skin 3 (three) times daily before meals. Sliding scale: No units if <120, 3 units if 120-150, 4 if 150-180    Historical Provider, MD  insulin glargine (LANTUS) 100 UNIT/ML injection Inject 0.1 mLs (10 Units total) into the skin daily. 08/14/13   Ivan Anchors Love, PA-C  lanthanum (FOSRENOL) 1000 MG chewable tablet Chew 1 tablet (1,000 mg total) by mouth 3 (three) times daily with meals. 08/14/13   Ivan Anchors Love, PA-C  lidocaine (LIDODERM) 5 %  Place 2 patches onto the skin daily. Remove & Discard patch within 12 hours or as directed by MD 08/14/13   Ivan Anchors Love, PA-C  lidocaine (XYLOCAINE) 5 % ointment daily as needed for mild pain (due to dialysis).  01/21/13   Historical Provider, MD  methimazole (TAPAZOLE) 10 MG tablet Take 10 mg by mouth 3 (three) times daily.     Historical Provider, MD  metoprolol tartrate (LOPRESSOR) 25 MG tablet Take 1 tablet (25 mg total) by mouth 2 (two) times daily. 08/14/13   Ivan Anchors Love, PA-C  pantoprazole (PROTONIX) 40 MG tablet Take 40 mg by mouth daily.  Historical Provider, MD  ranolazine (RANEXA) 500 MG 12 hr tablet Take 1 tablet (500 mg total) by mouth 2 (two) times daily. 08/14/13   Bary Leriche, PA-C  topiramate (TOPAMAX) 25 MG tablet Take 1 tablet (25 mg total) by mouth daily. 08/14/13   Bary Leriche, PA-C  traZODone (DESYREL) 50 MG tablet Take 1 tablet (50 mg total) by mouth at bedtime. 08/14/13   Ivan Anchors Love, PA-C   BP 135/63  Pulse 94  Temp(Src) 97.6 F (36.4 C) (Oral)  Resp 12  Ht 5\' 7"  (1.702 m)  Wt 150 lb (68.04 kg)  BMI 23.49 kg/m2  SpO2 100% Physical Exam  Vitals reviewed. Constitutional: She appears lethargic. No distress.  HENT:  Head: Normocephalic and atraumatic.  Eyes: EOM are normal. Pupils are equal, round, and reactive to light.  Pupils pin point, equal, reactive bilaterally  Neck: Normal range of motion.  Cardiovascular: Normal rate, regular rhythm, normal heart sounds and intact distal pulses.   Pulmonary/Chest: Effort normal. No respiratory distress. She has decreased breath sounds in the right upper field, the right middle field and the right lower field.  Abdominal: Soft. Bowel sounds are normal. She exhibits no distension. There is no tenderness.  Musculoskeletal:  Unable to move bilateral LE  Neurological: She appears lethargic. A sensory deficit (bilateral LE asensate from hips ) is present. No cranial nerve deficit. She displays no seizure activity. GCS eye  subscore is 4. GCS verbal subscore is 4. GCS motor subscore is 6.  Reflex Scores:      Tricep reflexes are 1+ on the right side and 1+ on the left side.      Bicep reflexes are 1+ on the right side and 1+ on the left side.      Patellar reflexes are 0 on the right side and 0 on the left side. Bilateral LE 0/5, UE 4/5    ED Course  Procedures (including critical care time) Labs Review Labs Reviewed  CBC - Abnormal; Notable for the following:    WBC 13.5 (*)    RBC 3.38 (*)    Hemoglobin 10.5 (*)    HCT 33.0 (*)    RDW 17.7 (*)    Platelets 496 (*)    All other components within normal limits  COMPREHENSIVE METABOLIC PANEL - Abnormal; Notable for the following:    Chloride 94 (*)    Creatinine, Ser 2.85 (*)    Albumin 2.2 (*)    Alkaline Phosphatase 155 (*)    GFR calc non Af Amer 16 (*)    GFR calc Af Amer 18 (*)    All other components within normal limits  PROTIME-INR - Abnormal; Notable for the following:    Prothrombin Time 15.6 (*)    All other components within normal limits  I-STAT CHEM 8, ED - Abnormal; Notable for the following:    Sodium 136 (*)    Potassium 3.4 (*)    Chloride 93 (*)    Creatinine, Ser 2.90 (*)    Calcium, Ion 1.10 (*)    All other components within normal limits  I-STAT TROPOININ, ED - Abnormal; Notable for the following:    Troponin i, poc 0.10 (*)    All other components within normal limits  TROPONIN I  CBG MONITORING, ED  CBG MONITORING, ED  I-STAT CG4 LACTIC ACID, ED    Imaging Review Ct Head Wo Contrast  12/06/2013   CLINICAL DATA:  Altered mental status.  Headache.  EXAM: CT  HEAD WITHOUT CONTRAST  TECHNIQUE: Contiguous axial images were obtained from the base of the skull through the vertex without intravenous contrast.  COMPARISON:  11/04/2012  FINDINGS: There is no evidence of intracranial hemorrhage, brain edema, or other signs of acute infarction. There is no evidence of intracranial mass lesion or mass effect. No abnormal  extraaxial fluid collections are identified.  Generalized ventriculomegaly remains stable. No other intracranial abnormality identified. Old right frontal craniotomy or ventriculostomy defect again noted.  IMPRESSION: No acute intracranial abnormality. Stable ventriculomegaly and old right frontal craniotomy/ventriculostomy defect.   Electronically Signed   By: Earle Gell M.D.   On: 12/06/2013 18:15   Dg Chest Portable 1 View  12/06/2013   CLINICAL DATA:  Initial encounter for 1 day history of respiratory distress  EXAM: PORTABLE CHEST - 1 VIEW  COMPARISON:  Chest x-ray from 04/02/2013.  Chest CT from 07/17/2013.  FINDINGS: 1624 hrs. There is a large right pleural effusion with diffuse airspace disease within the visualized portions of the right lung. No left-sided pleural effusion. No pulmonary edema in the left lung. Cardiopericardial silhouette is enlarged. Patient is status post CABG. Telemetry leads overlie the chest.  IMPRESSION: New large right-sided pleural effusion. Unilateral pleural effusion can be a sign of malignancy.   Electronically Signed   By: Misty Stanley M.D.   On: 12/06/2013 16:45     EKG Interpretation   Date/Time:  Friday December 06 2013 16:14:27 EDT Ventricular Rate:  92 PR Interval:  171 QRS Duration: 91 QT Interval:  399 QTC Calculation: 494 R Axis:   53 Text Interpretation:  Sinus rhythm Probable left atrial enlargement Left  ventricular hypertrophy ST elevation, consider anterior injury Borderline  prolonged QT interval When compared with ECG of 10/09/2012, No significant  change was found Confirmed by [facility]  MD, DAVID ([id number]) on 12/06/2013  4:32:39 PM      MDM   Final diagnoses:  Hypoxia  Altered mental status  Pain    73 y/o female with ESRD, spinal mass with pathologic fx (unknown primary) with AMS. Pt was at MRI for evaluation of progression of spinal mass s/p laminectomy. MRI showing worsening cord compression and edema. Pt paraplegic. Noted to become  confused and hypoxic after receiving sedation for MRI. Reported hypoxia by EMS but pt maintaining sats here on 3 L [place]. Somnolent but answering questions appropriately. Pts main concern is pain in lumbar back which has been ongoing. CXR showing large R pleural effusion concerning for malignancy. No fevers or productive cough so will hold abx. CT head obtained without acute abnormality. BS 88 on arrival. Troponin mildly elevated on istat but normal on formal lab and no ekg changes. Troponin leak expected given ESRD. K and BUN wnl. Neurosurgery has seen in ED and she is not a surgical candidate. Will need admission to Medicine for metastatic/malignant workup as primary source still unknown.       Amparo Bristol, MD 12/07/13 1146

## 2013-12-06 NOTE — ED Notes (Signed)
X-ray at bedside

## 2013-12-06 NOTE — ED Notes (Addendum)
Lab results given to [facility] of CG4, and Troponin results.

## 2013-12-06 NOTE — Consult Note (Signed)
Reason for Consult: Paraplegia Referring Physician: Emergency department  Holly Ellison is an 73 y.o. female.  HPI: Patient is a 73 year old female who is followed by one of my partners for an unknown mass lesion that causing spinal cord compression that he did a decompressive laminectomy for a month or so ago. The patient has been nonambulatory now for several weeks was improving somewhat after her decompressive laminectomy but was noted to be getting weaker recently was recently seen in the office and outpatient MRI scan was ordered and when the outpatient MRI scan showed a progressive pathologic fracture and collapse as well as worsening cord compression and cord edema patient was transferred to the emergency department for evaluation. Currently employed patient reports back pain but she denies having any feeling or sensation in her legs and denies any movement in her legs and she was a very poor historian as far as elucidating how long been that way but she said a very long time and she said she's been up and and [place] for a very long time. She has a very, [place] medical history to include end-stage renal disease and diabetes and hypertension, congestive heart failure,Etc.  Past Medical History  Diagnosis Date  . Chronic kidney disease     just found out? Placed graft in NOv-Dr. Tarri Glenn in [facility].  started dialysis in 06/2011  . Arthritis     Osteoarthritis  . Neuropathy in diabetes   . Insomnia   . Asthma   . CAD (coronary artery disease) 2012    7 stents-First PCI was in [place].  The last time she had a procedure was  a stent last year, thena  CABG was undertaken in October [facility])  . GERD (gastroesophageal reflux disease)   . Diabetes mellitus     Type II  . COPD (chronic obstructive pulmonary disease)   . Hypertension   . Peripheral vascular disease     endarterectomy  . Anemia   . CHF (congestive heart failure)   . Coronary artery dilation   . Myocardial infarction  2013    x 3  . 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 H/O Bell's palsy   . Frequent headaches   . Orthostasis     Past Surgical History  Procedure Laterality Date  . Abdominal hysterectomy    . Coronary artery bypass graft  2012    7 stents  . Cea      Left  . Carpal tunnel release    . Av fistula placement    . Grave's disease      ? unclea rif ca or not   . Laminectomy N/A 07/16/2013    Procedure: THORACIC LAMINECTOMY FOR TUMOR;  Surgeon: Erline Levine, MD;  Location: [facility];  Service: Neurosurgery;  Laterality: N/A;    Family History  Problem Relation Age of Onset  . Diabetes type II      Social History:  reports that she quit smoking about 17 years ago. Her smoking use included Cigarettes. She smoked 0.00 packs per day. She has never used smokeless tobacco. She reports that she does not drink alcohol or use illicit drugs.  Allergies:  Allergies  Allergen Reactions  . Dilaudid [Hydromorphone Hcl] Itching  . Morphine Sulfate Itching  . Opium Itching  . Ciprofloxacin Hcl Itching  . Lexapro [Escitalopram Oxalate]     Mental status changes/lethargy  . Procainamide Hcl     Unknown reaction  . Robaxin [  Methocarbamol]     lethargy    Medications: I have reviewed the patient's current medications.  Results for orders placed during the hospital encounter of 12/06/13 (from the past 48 hour(s))  CBG MONITORING, ED     Status: None   Collection Time    12/06/13  4:07 PM      Result Value Ref Range   Glucose-Capillary 88  70 - 99 mg/dL   Comment 1 Documented in Chart     Comment 2 Notify RN    CBC     Status: Abnormal   Collection Time    12/06/13  4:17 PM      Result Value Ref Range   WBC 13.5 (*) 4.0 - 10.5 K/uL   RBC 3.38 (*) 3.87 - 5.11 MIL/uL   Hemoglobin 10.5 (*) 12.0 - 15.0 g/dL   HCT 33.0 (*) 36.0 - 46.0 %   MCV 97.6  78.0 - 100.0 fL   MCH 31.1  26.0 - 34.0 pg   MCHC 31.8  30.0 - 36.0 g/dL   RDW 17.7 (*) 11.5 - 15.5 %   Platelets 496  (*) 150 - 400 K/uL  COMPREHENSIVE METABOLIC PANEL     Status: Abnormal   Collection Time    12/06/13  4:17 PM      Result Value Ref Range   Sodium 139  137 - 147 mEq/L   Potassium 3.7  3.7 - 5.3 mEq/L   Chloride 94 (*) 96 - 112 mEq/L   CO2 32  19 - 32 mEq/L   Glucose, Bld 77  70 - 99 mg/dL   BUN 22  6 - 23 mg/dL   Creatinine, Ser 2.85 (*) 0.50 - 1.10 mg/dL   Calcium 9.3  8.4 - 10.5 mg/dL   Total Protein 7.6  6.0 - 8.3 g/dL   Albumin 2.2 (*) 3.5 - 5.2 g/dL   AST 29  0 - 37 U/L   ALT 23  0 - 35 U/L   Alkaline Phosphatase 155 (*) 39 - 117 U/L   Total Bilirubin 0.5  0.3 - 1.2 mg/dL   GFR calc non Af Amer 16 (*) >90 mL/min   GFR calc Af Amer 18 (*) >90 mL/min   Comment: (NOTE)     The eGFR has been calculated using the CKD EPI equation.     This calculation has not been validated in all clinical situations.     eGFR's persistently <90 mL/min signify possible Chronic Kidney     Disease.   Anion gap 13  5 - 15  PROTIME-INR     Status: Abnormal   Collection Time    12/06/13  4:17 PM      Result Value Ref Range   Prothrombin Time 15.6 (*) 11.6 - 15.2 seconds   INR 1.23  0.00 - 1.49  I-STAT TROPOININ, ED     Status: Abnormal   Collection Time    12/06/13  4:23 PM      Result Value Ref Range   Troponin i, poc 0.10 (*) 0.00 - 0.08 ng/mL   Comment NOTIFIED PHYSICIAN     Comment 3            Comment: Due to the release kinetics of cTnI,     a negative result within the first hours     of the onset of symptoms does not rule out     myocardial infarction with certainty.     If myocardial infarction is still suspected,  repeat the test at appropriate intervals.  I-STAT CHEM 8, ED     Status: Abnormal   Collection Time    12/06/13  4:24 PM      Result Value Ref Range   Sodium 136 (*) 137 - 147 mEq/L   Potassium 3.4 (*) 3.7 - 5.3 mEq/L   Chloride 93 (*) 96 - 112 mEq/L   BUN 22  6 - 23 mg/dL   Creatinine, Ser 2.90 (*) 0.50 - 1.10 mg/dL   Glucose, Bld 78  70 - 99 mg/dL    Calcium, Ion 1.10 (*) 1.13 - 1.30 mmol/L   TCO2 33  0 - 100 mmol/L   Hemoglobin 12.6  12.0 - 15.0 g/dL   HCT 37.0  36.0 - 46.0 %  I-STAT CG4 LACTIC ACID, ED     Status: None   Collection Time    12/06/13  4:25 PM      Result Value Ref Range   Lactic Acid, Venous 2.02  0.5 - 2.2 mmol/L  TROPONIN I     Status: None   Collection Time    12/06/13  5:13 PM      Result Value Ref Range   Troponin I <0.30  <0.30 ng/mL   Comment:            Due to the release kinetics of cTnI,     a negative result within the first hours     of the onset of symptoms does not rule out     myocardial infarction with certainty.     If myocardial infarction is still suspected,     repeat the test at appropriate intervals.    Dg Chest Portable 1 View  12/06/2013   CLINICAL DATA:  Initial encounter for 1 day history of respiratory distress  EXAM: PORTABLE CHEST - 1 VIEW  COMPARISON:  Chest x-ray from 04/02/2013.  Chest CT from 07/17/2013.  FINDINGS: 1624 hrs. There is a large right pleural effusion with diffuse airspace disease within the visualized portions of the right lung. No left-sided pleural effusion. No pulmonary edema in the left lung. Cardiopericardial silhouette is enlarged. Patient is status post CABG. Telemetry leads overlie the chest.  IMPRESSION: New large right-sided pleural effusion. Unilateral pleural effusion can be a sign of malignancy.   Electronically Signed   By: Misty Stanley M.D.   On: 12/06/2013 16:45    Review of Systems  Unable to perform ROS: mental acuity   Blood pressure 164/72, pulse 49, temperature 97.6 F (36.4 C), temperature source Oral, resp. rate 21, height _0  (1.702 m), weight 68.04 kg (150 lb), SpO2 98.00%. Physical Exam  Constitutional: She appears well-developed and well-nourished.  HENT:  Head: Normocephalic.  Eyes: Pupils are equal, round, and reactive to light.  GI: Soft.  Neurological: She is alert. GCS eye subscore is 4. GCS verbal subscore is 5. GCS motor  subscore is 6.  Reflex Scores:      Patellar reflexes are 0 on the right side and 0 on the left side.      Achilles reflexes are 0 on the right side and 0 on the left side. Patient moves her upper extremity as well with 5 out of 5 strength. The patient has no volitional movement of her lower extremities. Strength is 0 out of 5. She also appears to be a sensate. I did witness some reflexive withdrawal of her lower extremities but no spontaneous motor function. Reflexes are also areflexic.  Skin: Skin is warm and  dry.    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 However she has a virtually complete paraplegia which is not reversible with surgery. It's unclear the duration this is been there but it's been at least several days if not weeks that she's been plegic and prior that densely paretic. In the setting of end-stage renal disease and all her other medical comorbidities especially not really knowing yet with the etiology of this as she is a very high risk for failure for any kind of surgical decompression and fusion. She's had this decompressed month ago and it looks like she broke down and sustained pathologic fractures above the decompression however in light of the inability to reverse her neurologic dysfunction I do not see a role for surgical fusion this. I explained this to patient she does not want to proceed for with surgery regardless and I recommended we continue with complete medical workup she has a large pleural effusion as witnessed on her MRI scan unclear of its etiology or its duration and its possible may be amenable to tapping this to get a cytological as well as culture analysis of. Differential diagnosis of this pathologic fracture certainly could be metastatic disease osteomyelitis discitis possibly AFB or TB. I recommend continued  continue bed rest for the time being while this workup is underway and she can be mobilized with a brace for pain control as needed.  Ranee Peasley P 12/06/2013, 5:52 PM

## 2013-12-06 NOTE — ED Notes (Signed)
Per ems, pt picked up at outpatient imaging center for altered mental status and respiratory distress. Pt is a dialysis patient, ems states pt has not had dialysis since Monday.  Pt arrives awake, oriented to self, year, president. Unable to answer when last dialysis was

## 2013-12-07 ENCOUNTER — Inpatient Hospital Stay (HOSPITAL_COMMUNITY): Payer: Medicare Other

## 2013-12-07 DIAGNOSIS — G839 Paralytic syndrome, unspecified: Secondary | ICD-10-CM

## 2013-12-07 DIAGNOSIS — J948 Other specified pleural conditions: Secondary | ICD-10-CM

## 2013-12-07 DIAGNOSIS — E0590 Thyrotoxicosis, unspecified without thyrotoxic crisis or storm: Secondary | ICD-10-CM

## 2013-12-07 LAB — COMPREHENSIVE METABOLIC PANEL
ALT: 19 U/L (ref 0–35)
AST: 25 U/L (ref 0–37)
Albumin: 2 g/dL — ABNORMAL LOW (ref 3.5–5.2)
Alkaline Phosphatase: 145 U/L — ABNORMAL HIGH (ref 39–117)
Anion gap: 15 (ref 5–15)
BUN: 26 mg/dL — ABNORMAL HIGH (ref 6–23)
CO2: 28 meq/L (ref 19–32)
Calcium: 9.1 mg/dL (ref 8.4–10.5)
Chloride: 95 mEq/L — ABNORMAL LOW (ref 96–112)
Creatinine, Ser: 3.28 mg/dL — ABNORMAL HIGH (ref 0.50–1.10)
GFR calc Af Amer: 15 mL/min — ABNORMAL LOW (ref >90)
GFR, EST NON AFRICAN AMERICAN: 13 mL/min — AB (ref >90)
Glucose, Bld: 62 mg/dL — ABNORMAL LOW (ref 70–99)
Potassium: 3.7 mEq/L (ref 3.7–5.3)
Sodium: 138 mEq/L (ref 137–147)
TOTAL PROTEIN: 7 g/dL (ref 6.0–8.3)
Total Bilirubin: 0.5 mg/dL (ref 0.3–1.2)

## 2013-12-07 LAB — CBC
HEMATOCRIT: 30.2 % — AB (ref 36.0–46.0)
HEMOGLOBIN: 9.7 g/dL — AB (ref 12.0–15.0)
MCH: 30.3 pg (ref 26.0–34.0)
MCHC: 32.1 g/dL (ref 30.0–36.0)
MCV: 94.4 fL (ref 78.0–100.0)
Platelets: 487 10*3/uL — ABNORMAL HIGH (ref 150–400)
RBC: 3.2 MIL/uL — AB (ref 3.87–5.11)
RDW: 17.3 % — ABNORMAL HIGH (ref 11.5–15.5)
WBC: 10.3 10*3/uL (ref 4.0–10.5)

## 2013-12-07 LAB — GLUCOSE, CAPILLARY
GLUCOSE-CAPILLARY: 105 mg/dL — AB (ref 70–99)
GLUCOSE-CAPILLARY: 60 mg/dL — AB (ref 70–99)
GLUCOSE-CAPILLARY: 77 mg/dL (ref 70–99)
GLUCOSE-CAPILLARY: 78 mg/dL (ref 70–99)
GLUCOSE-CAPILLARY: 86 mg/dL (ref 70–99)
Glucose-Capillary: 72 mg/dL (ref 70–99)
Glucose-Capillary: 63 mg/dL — ABNORMAL LOW (ref 70–99)
Glucose-Capillary: 63 mg/dL — ABNORMAL LOW (ref 70–99)
Glucose-Capillary: 110 mg/dL — ABNORMAL HIGH (ref 70–99)

## 2013-12-07 LAB — HEMOGLOBIN A1C
Hgb A1c MFr Bld: 5.5 % (ref <5.7)
MEAN PLASMA GLUCOSE: 111 mg/dL (ref <117)

## 2013-12-07 LAB — T4, FREE: FREE T4: 0.42 ng/dL — AB (ref 0.80–1.80)

## 2013-12-07 LAB — MAGNESIUM: Magnesium: 2.5 mg/dL (ref 1.5–2.5)

## 2013-12-07 LAB — TSH: TSH: 13.72 u[IU]/mL — ABNORMAL HIGH (ref 0.350–4.500)

## 2013-12-07 LAB — MRSA PCR SCREENING: MRSA by PCR: NEGATIVE

## 2013-12-07 LAB — PHOSPHORUS: Phosphorus: 4.2 mg/dL (ref 2.3–4.6)

## 2013-12-07 LAB — LACTATE DEHYDROGENASE: LDH: 239 U/L (ref 94–250)

## 2013-12-07 MED ORDER — ONDANSETRON HCL 4 MG/2ML IJ SOLN
4.0000 mg | Freq: Four times a day (QID) | INTRAMUSCULAR | Status: DC | PRN
  Administered 2013-12-19: 4 mg via INTRAVENOUS
  Filled 2013-12-07: qty 2

## 2013-12-07 MED ORDER — ACETAMINOPHEN 325 MG PO TABS
650.0000 mg | ORAL_TABLET | Freq: Four times a day (QID) | ORAL | Status: DC | PRN
  Administered 2013-12-07 – 2013-12-14 (×5): 650 mg via ORAL
  Filled 2013-12-07 (×4): qty 2

## 2013-12-07 MED ORDER — ATORVASTATIN CALCIUM 40 MG PO TABS
40.0000 mg | ORAL_TABLET | Freq: Every day | ORAL | Status: DC
  Administered 2013-12-07 – 2013-12-18 (×12): 40 mg via ORAL
  Filled 2013-12-07 (×14): qty 1

## 2013-12-07 MED ORDER — ALPRAZOLAM 0.25 MG PO TABS
0.2500 mg | ORAL_TABLET | Freq: Three times a day (TID) | ORAL | Status: DC | PRN
  Administered 2013-12-07 (×2): 0.25 mg via ORAL
  Filled 2013-12-07 (×2): qty 1

## 2013-12-07 MED ORDER — ACETAMINOPHEN 325 MG PO TABS: 325.0000 mg | ORAL_TABLET | ORAL | Status: DC | PRN

## 2013-12-07 MED ORDER — DOCUSATE SODIUM 100 MG PO CAPS
100.0000 mg | ORAL_CAPSULE | Freq: Two times a day (BID) | ORAL | Status: DC
  Administered 2013-12-07 – 2013-12-19 (×23): 100 mg via ORAL
  Filled 2013-12-07 (×32): qty 1

## 2013-12-07 MED ORDER — GLUCOSE 40 % PO GEL
ORAL | Status: AC
  Filled 2013-12-07: qty 1

## 2013-12-07 MED ORDER — ACETAMINOPHEN 650 MG RE SUPP: 650.0000 mg | Freq: Four times a day (QID) | RECTAL | Status: DC | PRN

## 2013-12-07 MED ORDER — NEPRO/CARBSTEADY PO LIQD: 237.0000 mL | ORAL | Status: DC | PRN

## 2013-12-07 MED ORDER — ONDANSETRON HCL 4 MG PO TABS
4.0000 mg | ORAL_TABLET | Freq: Four times a day (QID) | ORAL | Status: DC | PRN
  Administered 2013-12-08: 4 mg via ORAL
  Filled 2013-12-07: qty 1

## 2013-12-07 MED ORDER — AMLODIPINE BESYLATE 5 MG PO TABS
5.0000 mg | ORAL_TABLET | Freq: Every day | ORAL | Status: DC
  Administered 2013-12-07 – 2013-12-13 (×5): 5 mg via ORAL
  Filled 2013-12-07 (×9): qty 1

## 2013-12-07 MED ORDER — SODIUM CHLORIDE 0.9 % IV SOLN: 250.0000 mL | INTRAVENOUS | Status: DC | PRN

## 2013-12-07 MED ORDER — INSULIN ASPART 100 UNIT/ML ~~LOC~~ SOLN: 0.0000 [IU] | SUBCUTANEOUS | Status: DC

## 2013-12-07 MED ORDER — TRAZODONE HCL 50 MG PO TABS
50.0000 mg | ORAL_TABLET | Freq: Every day | ORAL | Status: DC
  Administered 2013-12-07 – 2013-12-11 (×5): 50 mg via ORAL
  Filled 2013-12-07 (×7): qty 1

## 2013-12-07 MED ORDER — OXYCODONE HCL 5 MG PO TABS
5.0000 mg | ORAL_TABLET | ORAL | Status: DC | PRN
  Administered 2013-12-07 – 2013-12-11 (×6): 5 mg via ORAL
  Filled 2013-12-07 (×6): qty 1

## 2013-12-07 MED ORDER — IOHEXOL 300 MG/ML  SOLN
100.0000 mL | Freq: Once | INTRAMUSCULAR | Status: AC | PRN
  Administered 2013-12-07: 100 mL via INTRAVENOUS

## 2013-12-07 MED ORDER — SODIUM CHLORIDE 0.9 % IV SOLN: 100.0000 mL | INTRAVENOUS | Status: DC | PRN

## 2013-12-07 MED ORDER — IOHEXOL 300 MG/ML  SOLN: 25.0000 mL | INTRAMUSCULAR | Status: AC

## 2013-12-07 MED ORDER — LIDOCAINE HCL (PF) 1 % IJ SOLN: 5.0000 mL | INTRAMUSCULAR | Status: DC | PRN

## 2013-12-07 MED ORDER — SODIUM CHLORIDE 0.9 % IJ SOLN
3.0000 mL | Freq: Two times a day (BID) | INTRAMUSCULAR | Status: DC
  Administered 2013-12-07 – 2013-12-09 (×3): 3 mL via INTRAVENOUS

## 2013-12-07 MED ORDER — RANOLAZINE ER 500 MG PO TB12
500.0000 mg | ORAL_TABLET | Freq: Two times a day (BID) | ORAL | Status: DC
  Administered 2013-12-07 – 2013-12-19 (×25): 500 mg via ORAL
  Filled 2013-12-07 (×26): qty 1

## 2013-12-07 MED ORDER — BISACODYL 10 MG RE SUPP
10.0000 mg | Freq: Every day | RECTAL | Status: DC
  Filled 2013-12-07 (×6): qty 1

## 2013-12-07 MED ORDER — OXYCODONE HCL ER 10 MG PO T12A
10.0000 mg | EXTENDED_RELEASE_TABLET | Freq: Two times a day (BID) | ORAL | Status: DC
  Administered 2013-12-07 – 2013-12-12 (×10): 10 mg via ORAL
  Filled 2013-12-07 (×13): qty 1

## 2013-12-07 MED ORDER — ASPIRIN EC 81 MG PO TBEC
81.0000 mg | DELAYED_RELEASE_TABLET | Freq: Every day | ORAL | Status: DC
  Administered 2013-12-07 – 2013-12-19 (×12): 81 mg via ORAL
  Filled 2013-12-07 (×14): qty 1

## 2013-12-07 MED ORDER — SODIUM CHLORIDE 0.9 % IJ SOLN
3.0000 mL | Freq: Two times a day (BID) | INTRAMUSCULAR | Status: DC
  Administered 2013-12-07 – 2013-12-19 (×16): 3 mL via INTRAVENOUS

## 2013-12-07 MED ORDER — SODIUM CHLORIDE 0.9 % IJ SOLN: 3.0000 mL | INTRAMUSCULAR | Status: DC | PRN

## 2013-12-07 MED ORDER — LIDOCAINE-PRILOCAINE 2.5-2.5 % EX CREA: 1.0000 "application " | TOPICAL_CREAM | CUTANEOUS | Status: DC | PRN

## 2013-12-07 MED ORDER — ALBUTEROL SULFATE (2.5 MG/3ML) 0.083% IN NEBU: 3.0000 mL | INHALATION_SOLUTION | Freq: Four times a day (QID) | RESPIRATORY_TRACT | Status: DC | PRN

## 2013-12-07 MED ORDER — METOPROLOL TARTRATE 25 MG PO TABS
25.0000 mg | ORAL_TABLET | Freq: Two times a day (BID) | ORAL | Status: DC
  Administered 2013-12-07 – 2013-12-17 (×18): 25 mg via ORAL
  Filled 2013-12-07 (×23): qty 1

## 2013-12-07 MED ORDER — TOPIRAMATE 25 MG PO TABS
25.0000 mg | ORAL_TABLET | Freq: Every day | ORAL | Status: DC
  Administered 2013-12-07 – 2013-12-11 (×4): 25 mg via ORAL
  Filled 2013-12-07 (×7): qty 1

## 2013-12-07 MED ORDER — PANTOPRAZOLE SODIUM 40 MG PO TBEC
40.0000 mg | DELAYED_RELEASE_TABLET | Freq: Every day | ORAL | Status: DC
  Administered 2013-12-07 – 2013-12-19 (×13): 40 mg via ORAL
  Filled 2013-12-07 (×12): qty 1

## 2013-12-07 MED ORDER — HYDRALAZINE HCL 20 MG/ML IJ SOLN: 10.0000 mg | INTRAMUSCULAR | Status: DC | PRN

## 2013-12-07 MED ORDER — DEXTROSE 50 % IV SOLN
25.0000 mL | Freq: Once | INTRAVENOUS | Status: AC | PRN
Start: 2013-12-07 — End: 2013-12-07
  Administered 2013-12-07: 25 mL via INTRAVENOUS
  Filled 2013-12-07: qty 50

## 2013-12-07 MED ORDER — LANTHANUM CARBONATE 500 MG PO CHEW
1000.0000 mg | CHEWABLE_TABLET | Freq: Three times a day (TID) | ORAL | Status: DC
  Administered 2013-12-08 – 2013-12-15 (×12): 1000 mg via ORAL
  Filled 2013-12-07 (×28): qty 2

## 2013-12-07 MED ORDER — BOOST / RESOURCE BREEZE PO LIQD
1.0000 | Freq: Three times a day (TID) | ORAL | Status: DC
  Administered 2013-12-08 (×3): 1 via ORAL

## 2013-12-07 MED ORDER — GLUCOSE 40 % PO GEL
1.0000 | ORAL | Status: DC | PRN
  Administered 2013-12-07: 37.5 g via ORAL

## 2013-12-07 MED ORDER — METHIMAZOLE 10 MG PO TABS
10.0000 mg | ORAL_TABLET | Freq: Three times a day (TID) | ORAL | Status: DC
  Administered 2013-12-07 – 2013-12-08 (×4): 10 mg via ORAL
  Filled 2013-12-07 (×6): qty 1

## 2013-12-07 MED ORDER — HYDROCODONE-ACETAMINOPHEN 5-325 MG PO TABS
1.0000 | ORAL_TABLET | Freq: Two times a day (BID) | ORAL | Status: DC
  Administered 2013-12-07: 1 via ORAL
  Filled 2013-12-07: qty 1

## 2013-12-07 MED ORDER — OXYCODONE HCL 5 MG PO TABS
ORAL_TABLET | ORAL | Status: AC
  Administered 2013-12-07: 5 mg via ORAL
  Filled 2013-12-07: qty 1

## 2013-12-07 MED ORDER — DEXTROSE 50 % IV SOLN
INTRAVENOUS | Status: AC
  Administered 2013-12-07: 25 mL
  Filled 2013-12-07: qty 50

## 2013-12-07 MED ORDER — INSULIN GLARGINE 100 UNIT/ML ~~LOC~~ SOLN
5.0000 [IU] | Freq: Every day | SUBCUTANEOUS | Status: DC
  Administered 2013-12-07: 5 [IU] via SUBCUTANEOUS
  Filled 2013-12-07 (×2): qty 0.05

## 2013-12-07 MED ORDER — PENTAFLUOROPROP-TETRAFLUOROETH EX AERO: 1.0000 "application " | INHALATION_SPRAY | CUTANEOUS | Status: DC | PRN

## 2013-12-07 NOTE — Consult Note (Addendum)
Holly Ellison 12/07/2013 Rexene Agent Requesting Physician:  Roel Cluck  Reason for Consult:  ESRD, spinal mass, paralysis,  HPI:  5F ESRD MWF at [facility] via AVF presented to the emergency department on 12/06/2013 with confusion, progressive lower chimney paraplegia. The summer this year the patient was underwent decompressive thoracic laminectomy for an unclear spinal process leading to cord compression and edema. Since that time she has had progression and loss of lower extremity function.  Pathology from the surgical procedure was benign.  She presented to the ER where MRI of the thoracic spine demonstrated further spinal cord compression and vertebral destruction. She has been seen by neurosurgery and at this time is not a candidate for operative intervention.  On imaging she was found to have a large left-sided pleural effusion.  This was not present on a two-view chest x-ray in September 2014. She is a former smoker.  Patient has attended most hemodialysis treatments of late. Recent EDW decrease.  No documented fevers in the past several treatments. She has had struggles with pain control and we were attempting to connect her with pain management or PMR.   Filed Weights   12/06/13 1609 12/07/13 0100  Weight: 68.04 kg (150 lb) 67.8 kg (149 lb 7.6 oz)       ROS Balance of 12 systems is negative w/ exceptions as above  Outpt HD Orders Unit: [place] [facility] Days: MWF Time: 3h17min Dialyzer: F180 EDW: 67.5kg K/Ca: 2/2.25 Access: AVF, LUA Needle Size: / BFR/DFR: 400/1.5 UF Proflie: none VDRA: none EPO: Aranesp 13mcg qWk IV Fe: None Heparin: 4500 IVP qTx Most Recent Phos / PTH: 5.1 / 272; no binders VDRA or cinacalcet Most Recent TSAT / Ferritin: 13 / 1160 Most Recent eKT/V: 1.43 11/21/13 Treatment Adherence: good  PMH  Past Medical History  Diagnosis Date  . Chronic kidney disease     just found out? Placed graft in NOv-Dr. Tarri Glenn in [facility].  started  dialysis in 06/2011  . Arthritis     Osteoarthritis  . Neuropathy in diabetes   . Insomnia   . Asthma   . CAD (coronary artery disease) 2012    7 stents-First PCI was in [place].  The last time she had a procedure was  a stent last year, thena  CABG was undertaken in October [facility])  . GERD (gastroesophageal reflux disease)   . Diabetes mellitus     Type II  . COPD (chronic obstructive pulmonary disease)   . Hypertension   . Peripheral vascular disease     endarterectomy  . Anemia   . CHF (congestive heart failure)   . Coronary artery dilation   . Myocardial infarction 2013    x 3  . 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 H/O Bell's palsy   . Frequent headaches   . Orthostasis    PSH  Past Surgical History  Procedure Laterality Date  . Abdominal hysterectomy    . Coronary artery bypass graft  2012    7 stents  . Cea      Left  . Carpal tunnel release    . Av fistula placement    . Grave's disease      ? unclea rif ca or not   . Laminectomy N/A 07/16/2013    Procedure: THORACIC LAMINECTOMY FOR TUMOR;  Surgeon: Erline Levine, MD;  Location: [place] [facility];  Service: Neurosurgery;  Laterality: N/A;   FH  Family History  Problem  Relation Age of Onset  . Diabetes type II     SH  reports that she quit smoking about 17 years ago. Her smoking use included Cigarettes. She smoked 0.00 packs per day. She has never used smokeless tobacco. She reports that she does not drink alcohol or use illicit drugs. Allergies  Allergies  Allergen Reactions  . Dilaudid [Hydromorphone Hcl] Itching  . Morphine Sulfate Itching  . Opium Itching  . Ciprofloxacin Hcl Itching  . Lexapro [Escitalopram Oxalate]     Mental status changes/lethargy  . Procainamide Hcl     Unknown reaction  . Robaxin [Methocarbamol]     lethargy   Home medications Prior to Admission medications   Medication Sig Start Date End Date Taking? Authorizing Provider  acetaminophen (TYLENOL) 325 MG  tablet Take 1-2 tablets (325-650 mg total) by mouth every 4 (four) hours as needed for mild pain. 08/14/13   Bary Leriche, PA-C  albuterol (PROVENTIL HFA;VENTOLIN HFA) 108 (90 BASE) MCG/ACT inhaler Inhale 2 puffs into the lungs every 6 (six) hours as needed for wheezing.    Historical Provider, MD  ALPRAZolam Duanne Moron) 0.25 MG tablet Take 1 tablet (0.25 mg total) by mouth 3 (three) times daily as needed for anxiety. 08/14/13   Ivan Anchors Love, PA-C  amLODipine (NORVASC) 5 MG tablet Take 1 tablet (5 mg total) by mouth daily. 08/14/13   Bary Leriche, PA-C  aspirin EC 81 MG EC tablet Take 1 tablet (81 mg total) by mouth daily. 08/14/13   Ivan Anchors Love, PA-C  atorvastatin (LIPITOR) 40 MG tablet Take 40 mg by mouth at bedtime.     Historical Provider, MD  b complex-vitamin c-folic acid (NEPHRO-VITE) 0.8 MG TABS tablet Take 1 tablet by mouth daily.    Historical Provider, MD  bisacodyl (DULCOLAX) 10 MG suppository Place 1 suppository (10 mg total) rectally daily at 6 (six) AM. 08/14/13   Ivan Anchors Love, PA-C  calcium carbonate (TUMS EX) 750 MG chewable tablet Chew 1 tablet (750 mg total) by mouth 3 (three) times daily as needed for heartburn. 08/14/13   Bary Leriche, PA-C  clopidogrel (PLAVIX) 75 MG tablet Take 1 tablet (75 mg total) by mouth daily. 11/02/11   Monika Salk, MD  docusate sodium 100 MG CAPS Take 100 mg by mouth 2 (two) times daily. 08/14/13   Bary Leriche, PA-C  feeding supplement, RESOURCE BREEZE, (RESOURCE BREEZE) LIQD Take 1 Container by mouth 3 (three) times daily between meals. 08/14/13   Bary Leriche, PA-C  HYDROcodone-acetaminophen (NORCO/VICODIN) 5-325 MG per tablet Take 1 tablet by mouth 2 (two) times daily. 08/14/13   Ivan Anchors Love, PA-C  insulin aspart (NOVOLOG) 100 unit/mL injection Inject 0-4 Units into the skin 3 (three) times daily before meals. Sliding scale: No units if <120, 3 units if 120-150, 4 if 150-180    Historical Provider, MD  insulin glargine (LANTUS) 100 UNIT/ML injection Inject 0.1  mLs (10 Units total) into the skin daily. 08/14/13   Ivan Anchors Love, PA-C  lanthanum (FOSRENOL) 1000 MG chewable tablet Chew 1 tablet (1,000 mg total) by mouth 3 (three) times daily with meals. 08/14/13   Ivan Anchors Love, PA-C  lidocaine (LIDODERM) 5 % Place 2 patches onto the skin daily. Remove & Discard patch within 12 hours or as directed by MD 08/14/13   Ivan Anchors Love, PA-C  lidocaine (XYLOCAINE) 5 % ointment daily as needed for mild pain (due to dialysis).  01/21/13   Historical Provider, MD  methimazole (TAPAZOLE) 10 MG tablet Take 10 mg by mouth 3 (three) times daily.     Historical Provider, MD  metoprolol tartrate (LOPRESSOR) 25 MG tablet Take 1 tablet (25 mg total) by mouth 2 (two) times daily. 08/14/13   Ivan Anchors Love, PA-C  pantoprazole (PROTONIX) 40 MG tablet Take 40 mg by mouth daily.    Historical Provider, MD  ranolazine (RANEXA) 500 MG 12 hr tablet Take 1 tablet (500 mg total) by mouth 2 (two) times daily. 08/14/13   Bary Leriche, PA-C  topiramate (TOPAMAX) 25 MG tablet Take 1 tablet (25 mg total) by mouth daily. 08/14/13   Bary Leriche, PA-C  traZODone (DESYREL) 50 MG tablet Take 1 tablet (50 mg total) by mouth at bedtime. 08/14/13   Bary Leriche, PA-C    Current Medications Scheduled Meds: . amLODipine  5 mg Oral Daily  . aspirin EC  81 mg Oral Daily  . atorvastatin  40 mg Oral QHS  . bisacodyl  10 mg Rectal Daily  . docusate sodium  100 mg Oral BID  . feeding supplement (RESOURCE BREEZE)  1 Container Oral TID BM  . HYDROcodone-acetaminophen  1 tablet Oral BID  . insulin aspart  0-9 Units Subcutaneous 6 times per day  . insulin glargine  5 Units Subcutaneous Daily  . lanthanum  1,000 mg Oral TID WC  . methimazole  10 mg Oral TID  . metoprolol tartrate  25 mg Oral BID  . pantoprazole  40 mg Oral Daily  . ranolazine  500 mg Oral BID  . sodium chloride  3 mL Intravenous Q12H  . sodium chloride  3 mL Intravenous Q12H  . topiramate  25 mg Oral Daily  . traZODone  50 mg Oral QHS    Continuous Infusions:  PRN Meds:.sodium chloride, acetaminophen, acetaminophen, albuterol, ALPRAZolam, hydrALAZINE, ondansetron (ZOFRAN) IV, ondansetron, sodium chloride  CBC  Recent Labs Lab 12/06/13 1617 12/06/13 1624 12/07/13 0604  WBC 13.5*  --  10.3  HGB 10.5* 12.6 9.7*  HCT 33.0* 37.0 30.2*  MCV 97.6  --  94.4  PLT 496*  --  623*   Basic Metabolic Panel  Recent Labs Lab 12/06/13 1617 12/06/13 1624 12/07/13 0604  NA 139 136* 138  K 3.7 3.4* 3.7  CL 94* 93* 95*  CO2 32  --  28  GLUCOSE 77 78 62*  BUN 22 22 26*  CREATININE 2.85* 2.90* 3.28*  CALCIUM 9.3  --  9.1  PHOS  --   --  4.2    Physical Exam  Blood pressure 176/70, pulse 90, temperature 98.1 F (36.7 C), temperature source Oral, resp. rate 18, height 5\' 7"  (1.702 m), weight 67.8 kg (149 lb 7.6 oz), SpO2 95.00%. GEN: Chronically ill-appearing, lying in bed ENT: Poor dentition EYES: Muddy sclera CV: Regular rate and rhythm. No murmur or rub. PULM: Limited exam based on patient mobility, bronchial breath sounds in left lung field, normal work of breathing ABD: Soft, nontender, nondistended SKIN: No rashes or lesions, pressure dressing over left heel EXT: No lower shin edema MSK: Fasciculations in the calves bilaterally noted NEURO: Unable to move legs   A/P 1. T10-11 Cord Compression / [place] w/ paraplegia; past decompression;  1. Decompression / thoracic laminectomy 07/16/2013 -- Benign Path 2. Repeat MRI 12/06/08: pathological fracture T10/11 w/ severe cord compression/edema; extended to T9 3. NSG following 4. Will order CT C/A/P with IV contrast today  5. check blood cultures 2  6. SPEP and serum free  light chains  7. QuantiFERON goal assay 2. Large Right Pleural Effusion 1. Imaging today as above  2. Likely to need diagnostic thoracentesis 3. ESRD: THS at [facility] via AVF 1. Continue on maintenance dialysis schedule, including today 2. Long-term ability to perform dialysis or its benefits to the  patient will need to be addressed based on findings 4. HTN/Vol:  1. EDW 67.5kg at admission with post treatment blood pressure is remaining elevated 2. Meds include amlodipine, metoprolol 3. Elevated from admission, we'll bring patient to dialysis, try to achieve EDW, HTN medicines resumed, titrate as needed. 5. Anemia of CKD:  1. On Aranesp as an outpatient with hemoglobin at goal. And elevated ferritin preclude use of IV iron. 2. We will hold Aranesp as inpatient given concern for active malignancy 3. Transfusion as necessary 6. 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 Calcium and phosphorus at goal in-house 2. PTH acceptable as outpatient  Pearson Grippe MD 12/07/2013, 9:11 AM

## 2013-12-07 NOTE — Progress Notes (Signed)
PROGRESS NOTE  Holly Ellison NTZ:[id number] DOB: [birth year] DOA: 12/06/2013 PCP: [name], MD  HPI/Recap of past 43 hours: 73 year old female with past medical history of end-stage renal disease and intermittent compliance issues with dialysis who earlier this year underwent a decompressive thoracic laminectomy for unclear spinal process which was causing cord compression and edema presented to the emergency room on 10/30 with complaints of worsening shortness of breath times several days plus progression and loss of her right sided extremities. MRI thoracic spine demonstrated further spinal cord compression of vertebral destruction and seen by neurosurgery who felt that she would not be an operative candidate. On chest x-ray patient also noted to have large left-sided pleural effusion not present last month on x-ray. Initially when she was admitted, patient's mentation in question. Patient admitted to the hospitalist service. Nephrology consulted  This morning, patient quite upset, complaining of pain, mostly on right-sided chest. She is easily upset and irritable. Feels like breathing is a little rough.   Assessment/Plan: Active Problems:   Diabetes mellitus: Monitoring sugars   Hypertension   ESRD on dialysis: Seen by nephrology, getting dialysis today   Paraparesis: Non-operative candidate. Have spoken with palliative care who will see this weekend. In addition given her chronic pain, have managed by changing her twice a day instant release pain medication to scheduled every 12 hours extended release plus when necessary every 4 hour   Pleural effusion: CT scan ordered. Following results, will consult pulmonary for thoracentesis.   Altered mental state: Looks to be resolved. Unclear cause. Question hypoxia   hyperthyroidism: On Tapazole. TSH checked and found to be elevated at 12. Some of this could be acute due to illness. Checking free T4.   Code Status: Full code for now    Family Communication: Patient tells me she has a son who lives nearby. Left message   Disposition Plan: Here until further disposition plan in regards to pleural effusion and palliative care set up   Consultants:  Neurosurgery  Nephrology  Palliative care   Procedures:  None   Antibiotics:  None    Objective: BP 128/72  Pulse 99  Temp(Src) 98.7 F (37.1 C) (Oral)  Resp 16  Ht 5\' 7"  (1.702 m)  Wt 68.5 kg (151 lb 0.2 oz)  BMI 23.65 kg/m2  SpO2 99% No intake or output data in the 24 hours ending 12/07/13 1452 Filed Weights   12/06/13 1609 12/07/13 0100 12/07/13 1110  Weight: 68.04 kg (150 lb) 67.8 kg (149 lb 7.6 oz) 68.5 kg (151 lb 0.2 oz)    Exam:   General:  Easily irritable, alert and oriented 3, mild distress secondary pain  Cardiovascular: Regular and rhythm, S1 and S2  Respiratory: Decreased breath sounds right side, otherwise clear  Abdomen: Soft, nontender, nondistended, hypoactive bowel sounds  Musculoskeletal: No clubbing or cyanosis or edema. Right sided paraparesis   Data Reviewed: Basic Metabolic Panel:  Recent Labs Lab 12/06/13 1617 12/06/13 1624 12/07/13 0604  NA 139 136* 138  K 3.7 3.4* 3.7  CL 94* 93* 95*  CO2 32  --  28  GLUCOSE 77 78 62*  BUN 22 22 26*  CREATININE 2.85* 2.90* 3.28*  CALCIUM 9.3  --  9.1  MG  --   --  2.5  PHOS  --   --  4.2   Liver Function Tests:  Recent Labs Lab 12/06/13 1617 12/07/13 0604  AST 29 25  ALT 23 19  ALKPHOS 155* 145*  BILITOT 0.5 0.5  PROT 7.6 7.0  ALBUMIN 2.2* 2.0*   No results found for this basename: LIPASE, AMYLASE,  in the last 168 hours No results found for this basename: AMMONIA,  in the last 168 hours CBC:  Recent Labs Lab 12/06/13 1617 12/06/13 1624 12/07/13 0604  WBC 13.5*  --  10.3  HGB 10.5* 12.6 9.7*  HCT 33.0* 37.0 30.2*  MCV 97.6  --  94.4  PLT 496*  --  487*   Cardiac Enzymes:    Recent Labs Lab 12/06/13 1713  TROPONINI <0.30   BNP (last 3  results) No results found for this basename: PROBNP,  in the last 8760 hours CBG:  Recent Labs Lab 12/06/13 1607 12/07/13 0106 12/07/13 0555 12/07/13 0749 12/07/13 0833  GLUCAP 88 78 72 63* 105*    Recent Results (from the past 240 hour(s))  MRSA PCR SCREENING     Status: None   Collection Time    12/07/13  1:03 AM      Result Value Ref Range Status   MRSA by PCR NEGATIVE  NEGATIVE Final   Comment:            The GeneXpert MRSA Assay (FDA     approved for NASAL specimens     only), is one component of a     comprehensive MRSA colonization     surveillance program. It is not     intended to diagnose MRSA     infection nor to guide or     monitor treatment for     MRSA infections.     Studies: Ct Head Wo Contrast  12/06/2013   CLINICAL DATA:  Altered mental status.  Headache.  EXAM: CT HEAD WITHOUT CONTRAST  TECHNIQUE: Contiguous axial images were obtained from the base of the skull through the vertex without intravenous contrast.  COMPARISON:  11/04/2012  FINDINGS: There is no evidence of intracranial hemorrhage, brain edema, or other signs of acute infarction. There is no evidence of intracranial mass lesion or mass effect. No abnormal extraaxial fluid collections are identified.  Generalized ventriculomegaly remains stable. No other intracranial abnormality identified. Old right frontal craniotomy or ventriculostomy defect again noted.  IMPRESSION: No acute intracranial abnormality. Stable ventriculomegaly and old right frontal craniotomy/ventriculostomy defect.   Electronically Signed   By: Earle Gell M.D.   On: 12/06/2013 18:15   Dg Chest Portable 1 View  12/06/2013   CLINICAL DATA:  Initial encounter for 1 day history of respiratory distress  EXAM: PORTABLE CHEST - 1 VIEW  COMPARISON:  Chest x-ray from 04/02/2013.  Chest CT from 07/17/2013.  FINDINGS: 1624 hrs. There is a large right pleural effusion with diffuse airspace disease within the visualized portions of the right  lung. No left-sided pleural effusion. No pulmonary edema in the left lung. Cardiopericardial silhouette is enlarged. Patient is status post CABG. Telemetry leads overlie the chest.  IMPRESSION: New large right-sided pleural effusion. Unilateral pleural effusion can be a sign of malignancy.   Electronically Signed   By: Misty Stanley M.D.   On: 12/06/2013 16:45    Scheduled Meds: . amLODipine  5 mg Oral Daily  . aspirin EC  81 mg Oral Daily  . atorvastatin  40 mg Oral QHS  . bisacodyl  10 mg Rectal Daily  . docusate sodium  100 mg Oral BID  . feeding supplement (RESOURCE BREEZE)  1 Container Oral TID BM  . insulin aspart  0-9 Units Subcutaneous 6 times per day  . insulin glargine  5 Units Subcutaneous Daily  . lanthanum  1,000 mg Oral TID WC  . methimazole  10 mg Oral TID  . metoprolol tartrate  25 mg Oral BID  . OxyCODONE  10 mg Oral Q12H  . pantoprazole  40 mg Oral Daily  . ranolazine  500 mg Oral BID  . sodium chloride  3 mL Intravenous Q12H  . sodium chloride  3 mL Intravenous Q12H  . topiramate  25 mg Oral Daily  . traZODone  50 mg Oral QHS    Continuous Infusions:    Time spent: 25 minutes   [place] [facility] Pager [phone] . If 7PM-7AM, please contact night-coverage at [email or web address], password [email or web address] 12/07/2013, 2:52 PM  LOS: 1 day

## 2013-12-07 NOTE — Progress Notes (Signed)
New Admission Note:  Arrival Method: via stretcher with RN, transported with oxygen  Mental Orientation: A&Ox2 Telemetry: placed on box 18, notified CCMD Assessment: could not get information from patient Skin: See assessment Tubes:n/a Safety Measures: Safety Fall Prevention Plan placed at bedside. Admission: Completed [address] [place] Orientation: Patient has been orientated to the room, unit and the staff. Family: none at bedside  Orders have been reviewed and implemented. Will continue to monitor the patient. Call light has been placed within reach and bed alarm has been activated.   Leandro Reasoner BSN, RN  Phone Number: [phone] [place] [facility]

## 2013-12-07 NOTE — Procedures (Signed)
I was present at this dialysis session. I have reviewed the session itself and made appropriate changes.   Goal UF 1L, AVF.  Tolerating treatment.   Pearson Grippe  MD 12/07/2013, 12:26 PM

## 2013-12-07 NOTE — Progress Notes (Signed)
Blood pressure equals 197/72, 178/69 and 176/70 (manual). Notified NP on call, no orders received.

## 2013-12-07 NOTE — Progress Notes (Signed)
Hypoglycemic Event  CBG: 60  Treatment: D50 IV 25 mL  Symptoms: Hungry and Nervous/irritable  Follow-up CBG: Time:1855 CBG Result:110  Possible Reasons for Event: Inadequate meal intake  Comments/MD notified: anxious about upcoming test. Hungry    Codey Burling K  Remember to initiate Hypoglycemia Order Set & complete

## 2013-12-07 NOTE — Progress Notes (Signed)
Hypoglycemic Event  CBG: 63  Treatment: 1 tube instant glucose  Symptoms: Hungry  Follow-up CBG: Time:1702 CBG Result:60  Possible Reasons for Event: Inadequate meal intake  Comments/MD notified: anxious    Holly Ellison  Remember to initiate Hypoglycemia Order Set & complete

## 2013-12-08 DIAGNOSIS — Z515 Encounter for palliative care: Secondary | ICD-10-CM | POA: Diagnosis present

## 2013-12-08 DIAGNOSIS — R52 Pain, unspecified: Secondary | ICD-10-CM | POA: Diagnosis present

## 2013-12-08 DIAGNOSIS — R410 Disorientation, unspecified: Secondary | ICD-10-CM

## 2013-12-08 DIAGNOSIS — J90 Pleural effusion, not elsewhere classified: Secondary | ICD-10-CM

## 2013-12-08 DIAGNOSIS — K5900 Constipation, unspecified: Secondary | ICD-10-CM | POA: Diagnosis present

## 2013-12-08 DIAGNOSIS — G9520 Unspecified cord compression: Secondary | ICD-10-CM

## 2013-12-08 LAB — GLUCOSE, CAPILLARY
GLUCOSE-CAPILLARY: 114 mg/dL — AB (ref 70–99)
GLUCOSE-CAPILLARY: 87 mg/dL (ref 70–99)
GLUCOSE-CAPILLARY: 224 mg/dL — AB (ref 70–99)
Glucose-Capillary: 84 mg/dL (ref 70–99)
Glucose-Capillary: 64 mg/dL — ABNORMAL LOW (ref 70–99)
Glucose-Capillary: 239 mg/dL — ABNORMAL HIGH (ref 70–99)

## 2013-12-08 MED ORDER — POLYETHYLENE GLYCOL 3350 17 G PO PACK
17.0000 g | PACK | Freq: Every day | ORAL | Status: DC
  Administered 2013-12-09 – 2013-12-18 (×8): 17 g via ORAL
  Filled 2013-12-08 (×12): qty 1

## 2013-12-08 MED ORDER — COLLAGENASE 250 UNIT/GM EX OINT
TOPICAL_OINTMENT | Freq: Every day | CUTANEOUS | Status: DC
Start: 2013-12-08 — End: 2013-12-19
  Administered 2013-12-08 – 2013-12-17 (×7): via TOPICAL
  Administered 2013-12-18: 1 via TOPICAL
  Administered 2013-12-19: 10:00:00 via TOPICAL
  Filled 2013-12-08: qty 30

## 2013-12-08 MED ORDER — METHIMAZOLE 10 MG PO TABS
20.0000 mg | ORAL_TABLET | Freq: Three times a day (TID) | ORAL | Status: DC
Start: 2013-12-08 — End: 2013-12-19
  Administered 2013-12-08 – 2013-12-19 (×31): 20 mg via ORAL
  Filled 2013-12-08 (×35): qty 2

## 2013-12-08 MED ORDER — DULOXETINE HCL 30 MG PO CPEP
30.0000 mg | ORAL_CAPSULE | Freq: Two times a day (BID) | ORAL | Status: DC
  Filled 2013-12-08 (×2): qty 1

## 2013-12-08 MED ORDER — ALPRAZOLAM 0.25 MG PO TABS
0.2500 mg | ORAL_TABLET | Freq: Two times a day (BID) | ORAL | Status: DC
  Administered 2013-12-09: 0.25 mg via ORAL
  Filled 2013-12-08 (×2): qty 1

## 2013-12-08 MED ORDER — BACLOFEN 10 MG PO TABS
10.0000 mg | ORAL_TABLET | Freq: Two times a day (BID) | ORAL | Status: DC | PRN
  Administered 2013-12-08 – 2013-12-18 (×6): 10 mg via ORAL
  Filled 2013-12-08 (×10): qty 1

## 2013-12-08 MED ORDER — GABAPENTIN 100 MG PO CAPS
100.0000 mg | ORAL_CAPSULE | ORAL | Status: DC
  Administered 2013-12-11 – 2013-12-18 (×4): 100 mg via ORAL
  Filled 2013-12-08 (×7): qty 1

## 2013-12-08 MED ORDER — DULOXETINE HCL 30 MG PO CPEP
30.0000 mg | ORAL_CAPSULE | Freq: Every day | ORAL | Status: DC
  Administered 2013-12-09 – 2013-12-19 (×10): 30 mg via ORAL
  Filled 2013-12-08 (×12): qty 1

## 2013-12-08 MED ORDER — INSULIN ASPART 100 UNIT/ML ~~LOC~~ SOLN
0.0000 [IU] | Freq: Three times a day (TID) | SUBCUTANEOUS | Status: DC
  Administered 2013-12-08: 3 [IU] via SUBCUTANEOUS
  Administered 2013-12-09: 2 [IU] via SUBCUTANEOUS
  Administered 2013-12-10: 5 [IU] via SUBCUTANEOUS
  Administered 2013-12-11 (×2): 3 [IU] via SUBCUTANEOUS
  Administered 2013-12-11 – 2013-12-12 (×3): 2 [IU] via SUBCUTANEOUS
  Administered 2013-12-13: 9 [IU] via SUBCUTANEOUS

## 2013-12-08 MED ORDER — DEXTROSE 50 % IV SOLN
25.0000 mL | Freq: Once | INTRAVENOUS | Status: AC | PRN
  Administered 2013-12-08: 25 mL via INTRAVENOUS

## 2013-12-08 MED ORDER — ALPRAZOLAM 0.25 MG PO TABS
0.2500 mg | ORAL_TABLET | Freq: Three times a day (TID) | ORAL | Status: DC
  Administered 2013-12-08: 0.25 mg via ORAL
  Filled 2013-12-08: qty 1

## 2013-12-08 MED ORDER — FENTANYL 12 MCG/HR TD PT72
12.5000 ug | MEDICATED_PATCH | TRANSDERMAL | Status: DC
  Administered 2013-12-08 – 2013-12-11 (×3): 12.5 ug via TRANSDERMAL
  Filled 2013-12-08 (×3): qty 1

## 2013-12-08 MED ORDER — DEXTROSE 50 % IV SOLN
INTRAVENOUS | Status: AC
  Administered 2013-12-08: 25 mL via INTRAVENOUS
  Filled 2013-12-08: qty 50

## 2013-12-08 NOTE — Consult Note (Signed)
WOC wound consult note Reason for Consult:Asked to see for suggestions for care of unstageable pressure ulcers on the right heel and sacrum Wound type:Pressure Pressure Ulcer POA: Yes Measurement:Right heel is Unstageable stable, black, firmly adherent eschar measuring 3cm x 3.5cm.  No drainage, no periwound erythema or fluctuance.  Sacral ulcer is unstageable and measures 3.5cm x 6.5cm.  The depth is obscured by the presence of soft, yellow/white eschar.  There is scant drainage noted on the old dressing due to autolytic debridement.  Also noted the a faint odor consistent with autolytic debridement.  There is no periwound erythema. Wound bed:As described above. Drainage (amount, consistency, odor) As described above. Periwound: As described above. Dressing procedure/placement/frequency: Patient could benefit from a Prevalon pressure redistribution boot for the Unstageable PrU on right heel and I have ordered one today.  The soft silicone dressing on the heel is appropriate and I suggest we continue this POC. The sacral PrU is unstageable and the soft eschar is beginning to dissolve via autolysis, but this will take much longer than it would take via enzymatic debridement, so I will implement collagenase (Santyl) instead.  Guidance for turning and repositioning is provided, but is already in place. With the pressure redistribution heel boot and turning side to side while in bed, I do not believe a therapeutic mattress overlay is indicated. In the event she is able to get up to the chair/recliner chair, I have provided a pressure redistribution chair pad for that purpose. [place] nursing team will not follow, but will remain available to this patient, the nursing and medical team.  Please re-consult if needed. Thanks, Maudie Flakes, MSN, RN, [place], [place], [place] [phone])

## 2013-12-08 NOTE — Consult Note (Signed)
Patient Holly Ellison      DOB: 03/07/1940      XIP:[id number]     Consult Note from the Palliative Medicine Team at [facility] Requested by: Dr Manfred Arch    PCP: [name], MD Reason for Consultation: goals of care     Phone [phone]  Assessment/Recommendations: 73 yo female with multiple medical problems as below who presented with SOB and weakness in setting of compression fractures of unknown etiology and new Right Pleural effusion.    1.  Code Status: Full  2. Goals of Care: Met with patient and son Holly Ellison today.  Challenging situation. Holly Ellison is frustrated because he feels like medicine is being thrown at an unknown problem and that her mentation is drastically different than a few months ago prior to her initial spinal decompression. He relates her "being put on prozac and valium" as start of her mental decline.  He reports that she has been doing terrible at nursing home as well.  He believes that her mentation remains way off and frequently fluctuates through day despite improvement here.  Indria has voiced that she wants to stop dialysis, knowing she will die, because she is tired of suffering.  Holly Ellison feels that she fluctuates on this, and the association of dialysis and suffering has been planted in her mind at outpatient dialysis.  Ultimately, although Holly Ellison doesn't verbalize this, I suspect he is really struggling emotionally with his mothers decline and this is exacerbated by some diagnostic uncertainty (even if prognostically it is almost certain she will do poorly). When I ask how we should handle his moms request to stop dialysis, he responds that we should just keep things as they are.  Will attempt to talk further with him over coming days to see if we can make progress on goals.  May try to involve siblings from out of town (unclear if this will make things worse).  Given challenging nature of this case, it may be beneficial for  multidisciplinary meeting with multiple physicians caring for Holly Ellison talking with patient/family.  Certainly a clear and unified message will be important going forward.    To have thora tomorrow. I hope more diagnostic information can be obtained. She does have a bit of a globulin gap raising suspicion for possible paraproteinemia. SPEP/UPEP also pending.   3. Symptom Management:   1. Back/Abdominal Pain- has been ongoing challenge. Patient does not feel like she responds well to opiois (doesn't help much). oxycontin and PRN oxycodone added which I think are reasonable to try. Fentanyl patch also added today.  Based on how she does, can try to simplify to one long acting medication.  Difficult for her to describe her pain. She is on cymbalta for potential neuropathic component. Need to be cautious with cymbalta given renal impairment.  Baclofen PRN (not taking). Has been on gabapentin in the past and we can go ahead and re-try this with dosing adjustment for her ongoing dialysis.  2. Encephalopathy- sounds as if she has improved some here.  Son is very concerned about polypharmacy. Blames much of her problems on prozac and valium months ago. I think we can try weaning benzo some at his request. I will also make cymbalta dosed daily (probably should use smallest dose with ESRD). I warned him that I am not sure these medication changes will make significant difference.  She certainly seems high risk for vascular dementia given her comorbid diseases.   3. Chronic Constipation- Maintain current bowel  regimen.   4. Psychosocial/Spiritual: Originally she is from [place]. Has 5 children but only her son Holly Ellison lives locally.     Brief HPI: 73 yo female with PMHx of CAD, DM, ESRD, COPD, PVD who presented on 10/30 with SOB for several days, altered mental status, and paraplegia of lower ext in setting of unknown mass lesion which caused spinal cord compression.  Recently she had undergone decompressive  laminectomy with some improvement in her weakness, but reportedly has been non-ambulatory for several weeks now. Pathology report from surgical intervention was benign.  She was evaluated on admission by Dr Saintclair Halsted from [facility] and deemed not to be an operative candidate.  She does dialysis at [facility] and was reported to be mostly compliant with sessions.  She has significant struggles with pain control during dialysis. On admission she was noted to have new large right sided pleural effusion of unknown etiology.   Dr [place] Pink has started her on oxycontin and oxycodone to help with pain management. Prior to this, she was on PRN norco. She has had little success at SNF with managing her pain and some physicians have suggested pain is in her head to her sons frustrations. She has difficult time describing pain to me other than being in back and radiating to stomach.       PMH:  Past Medical History  Diagnosis Date  . Chronic kidney disease     just found out? Placed graft in NOv-Dr. Tarri Glenn in [facility].  started dialysis in 06/2011  . Arthritis     Osteoarthritis  . Neuropathy in diabetes   . Insomnia   . Asthma   . CAD (coronary artery disease) 2012    7 stents-First PCI was in [place].  The last time she had a procedure was  a stent last year, thena  CABG was undertaken in October [facility])  . GERD (gastroesophageal reflux disease)   . Diabetes mellitus     Type II  . COPD (chronic obstructive pulmonary disease)   . Hypertension   . Peripheral vascular disease     endarterectomy  . Anemia   . CHF (congestive heart failure)   . Coronary artery dilation   . Myocardial infarction 2013    x 3  . 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 H/O Bell's palsy   . Frequent headaches   . Orthostasis      PSH: Past Surgical History  Procedure Laterality Date  . Abdominal hysterectomy    . Coronary artery bypass graft  2012    7 stents  . Cea      Left  . Carpal  tunnel release    . Av fistula placement    . Grave's disease      ? unclea rif ca or not   . Laminectomy N/A 07/16/2013    Procedure: THORACIC LAMINECTOMY FOR TUMOR;  Surgeon: Erline Levine, MD;  Location: [facility];  Service: Neurosurgery;  Laterality: N/A;   I have reviewed the [place] and SH and  If appropriate update it with new information. Allergies  Allergen Reactions  . Dilaudid [Hydromorphone Hcl] Itching  . Morphine Sulfate Itching  . Opium Itching  . Ciprofloxacin Hcl Itching  . Lexapro [Escitalopram Oxalate] Other (See Comments)    Mental status changes/lethargy  . Procainamide Hcl Other (See Comments)    Unknown reaction  . Robaxin [Methocarbamol] Other (See Comments)    lethargy   Scheduled Meds: .  ALPRAZolam  0.25 mg Oral TID  . amLODipine  5 mg Oral Daily  . aspirin EC  81 mg Oral Daily  . atorvastatin  40 mg Oral QHS  . bisacodyl  10 mg Rectal Daily  . collagenase   Topical Daily  . docusate sodium  100 mg Oral BID  . DULoxetine  30 mg Oral BID  . feeding supplement (RESOURCE BREEZE)  1 Container Oral TID BM  . fentaNYL  12.5 mcg Transdermal Q72H  . lanthanum  1,000 mg Oral TID WC  . methimazole  20 mg Oral TID  . metoprolol tartrate  25 mg Oral BID  . OxyCODONE  10 mg Oral Q12H  . pantoprazole  40 mg Oral Daily  . polyethylene glycol  17 g Oral Daily  . ranolazine  500 mg Oral BID  . sodium chloride  3 mL Intravenous Q12H  . sodium chloride  3 mL Intravenous Q12H  . topiramate  25 mg Oral Daily  . traZODone  50 mg Oral QHS   Continuous Infusions:  PRN Meds:.sodium chloride, sodium chloride, sodium chloride, acetaminophen **OR** acetaminophen, albuterol, baclofen, dextrose, feeding supplement (NEPRO CARB STEADY), hydrALAZINE, lidocaine (PF), lidocaine-prilocaine, ondansetron **OR** ondansetron (ZOFRAN) IV, oxyCODONE, pentafluoroprop-tetrafluoroeth, sodium chloride    BP 160/66 mmHg  Pulse 103  Temp(Src) 98.3 F (36.8 C) (Oral)  Resp 22  Ht 5' 7"   (1.702 m)  Wt 67.5 kg (148 lb 13 oz)  BMI 23.30 kg/m2  SpO2 97%   PPS: 30   Intake/Output Summary (Last 24 hours) at 12/08/13 1406 Last data filed at 12/08/13 0900  Gross per 24 hour  Intake    480 ml  Output   1000 ml  Net   -520 ml   Physical Exam:  General: Alert, NAD HEENT:  [place], sclera anicteric Abdomen: soft, ND Ext:  No edema Neuro:lower ext paraplegia Psych: frequently tearful MSK; diffuse back tenderness with fearfulness of any attempts to examine.   Labs: CBC    Component Value Date/Time   WBC 10.3 12/07/2013 0604   RBC 3.20* 12/07/2013 0604   RBC 2.66* 06/05/2011 0156   HGB 9.7* 12/07/2013 0604   HCT 30.2* 12/07/2013 0604   PLT 487* 12/07/2013 0604   MCV 94.4 12/07/2013 0604   MCH 30.3 12/07/2013 0604   MCHC 32.1 12/07/2013 0604   RDW 17.3* 12/07/2013 0604   LYMPHSABS 1.5 07/17/2013 1110   MONOABS 0.3 07/17/2013 1110   EOSABS 0.0 07/17/2013 1110   BASOSABS 0.0 07/17/2013 1110    BMET    Component Value Date/Time   NA 138 12/07/2013 0604   K 3.7 12/07/2013 0604   CL 95* 12/07/2013 0604   CO2 28 12/07/2013 0604   GLUCOSE 62* 12/07/2013 0604   BUN 26* 12/07/2013 0604   CREATININE 3.28* 12/07/2013 0604   CALCIUM 9.1 12/07/2013 0604   GFRNONAA 13* 12/07/2013 0604   GFRAA 15* 12/07/2013 0604    CMP     Component Value Date/Time   NA 138 12/07/2013 0604   K 3.7 12/07/2013 0604   CL 95* 12/07/2013 0604   CO2 28 12/07/2013 0604   GLUCOSE 62* 12/07/2013 0604   BUN 26* 12/07/2013 0604   CREATININE 3.28* 12/07/2013 0604   CALCIUM 9.1 12/07/2013 0604   PROT 7.0 12/07/2013 0604   ALBUMIN 2.0* 12/07/2013 0604   AST 25 12/07/2013 0604   ALT 19 12/07/2013 0604   ALKPHOS 145* 12/07/2013 0604   BILITOT 0.5 12/07/2013 0604   GFRNONAA 13* 12/07/2013 0604  GFRAA 15* 12/07/2013 0604   10/30 CXR IMPRESSION: New large right-sided pleural effusion. Unilateral pleural effusion can be a sign of malignancy.   10/30 CT Head IMPRESSION: No acute  intracranial abnormality. Stable ventriculomegaly and old right frontal craniotomy/ventriculostomy defect.   10/31 CT Chest/abd/pelvis IMPRESSION: 1. Progressive destructive process involving the T9, T10, and T11 vertebra. Extension of abnormal soft tissue into the spinal canal at that level which probably and compresses the spinal cord. 2. Secondary extensive soft tissue reaction in the adjacent soft tissues. 3. Large right pleural effusion with compressive atelectasis the right lung. I suspect the effusion is due to the process in the lower thoracic spine. The biopsy at the time of decompression demonstrate no evidence of tumor. The possibility of focal amyloid deposition should be considered. Infection could also give this appearance although the biopsy did not demonstrate any evidence of infection at that time. 4. Negative CT scan of the abdomen.  Total Time 115 minutes  Greater than 50%  of this time was spent counseling and coordinating care related to the above assessment and plan.  [name] D.O. Palliative Medicine Team at [facility]  Pager: [phone] Team Phone: [phone]

## 2013-12-08 NOTE — Progress Notes (Signed)
Admit: 12/06/2013 LOS: 2  15F ESRD MWF at [facility] admitted with progressive subactue b/l LE paraplegia, recurrent and advanced destructive process T9-T11, large R sided pelural effusion  Subjective:  Restless night, lots of anxiety and pain States this morning "I can't do this" Palliative care to see B Cx, Quantiferon Gold, SPEP, sFLC ordered, pending CT C/A/P w/o explanation, no clear malignancy  HD yesterday in bed  10/31 0701 - 11/01 0700 In: 480 [P.O.:480] Out: 1000   Filed Weights   12/07/13 0100 12/07/13 1110 12/07/13 1458  Weight: 67.8 kg (149 lb 7.6 oz) 68.5 kg (151 lb 0.2 oz) 67.5 kg (148 lb 13 oz)    Scheduled Meds: . ALPRAZolam  0.25 mg Oral TID  . amLODipine  5 mg Oral Daily  . aspirin EC  81 mg Oral Daily  . atorvastatin  40 mg Oral QHS  . bisacodyl  10 mg Rectal Daily  . collagenase   Topical Daily  . docusate sodium  100 mg Oral BID  . DULoxetine  30 mg Oral BID  . feeding supplement (RESOURCE BREEZE)  1 Container Oral TID BM  . fentaNYL  12.5 mcg Transdermal Q72H  . lanthanum  1,000 mg Oral TID WC  . methimazole  10 mg Oral TID  . metoprolol tartrate  25 mg Oral BID  . OxyCODONE  10 mg Oral Q12H  . pantoprazole  40 mg Oral Daily  . polyethylene glycol  17 g Oral Daily  . ranolazine  500 mg Oral BID  . sodium chloride  3 mL Intravenous Q12H  . sodium chloride  3 mL Intravenous Q12H  . topiramate  25 mg Oral Daily  . traZODone  50 mg Oral QHS   Continuous Infusions:  PRN Meds:.sodium chloride, sodium chloride, sodium chloride, acetaminophen **OR** acetaminophen, albuterol, baclofen, dextrose, feeding supplement (NEPRO CARB STEADY), hydrALAZINE, lidocaine (PF), lidocaine-prilocaine, ondansetron **OR** ondansetron (ZOFRAN) IV, oxyCODONE, pentafluoroprop-tetrafluoroeth, sodium chloride  Current Labs: reviewed    Physical Exam:  Blood pressure 160/66, pulse 103, temperature 98.3 F (36.8 C), temperature source Oral, resp. rate 22, height 5\' 7"  (1.702  m), weight 67.5 kg (148 lb 13 oz), SpO2 97 %. GEN: Chronically ill-appearing, lying in bed ENT: Poor dentition EYES: Muddy sclera CV: Regular rate and rhythm. No murmur or rub. PULM: Limited exam based on patient mobility, bronchial breath sounds in left lung field, normal work of breathing ABD: Soft, nontender, nondistended SKIN: No rashes or lesions, pressure dressing over left heel EXT: No lower shin edema MSK: Fasciculations in the calves bilaterally noted NEURO: Unable to move legs   Outpt HD Orders Unit: [facility] [place] Days: [facility] Time: 3h77min Dialyzer: F180 EDW: 67.5kg K/Ca: 2/2.25 Access: AVF, LUA Needle Size: 15 BFR/DFR: 400/1.5 UF Proflie: none VDRA: none EPO: Aranesp 10mcg qWk IV Fe: None Heparin: 4500 IVP qTx Most Recent Phos / PTH: 5.1 / 272; no binders VDRA or cinacalcet Most Recent TSAT / Ferritin: 13 / 1160 Most Recent eKT/V: 1.43 11/21/13 Treatment Adherence: good  A/P 1. T10-11 Cord Compression / [place] w/ paraplegia; past decompression;  1. Decompression / thoracic laminectomy 07/16/2013 -- Benign Path 2. Repeat MRI 12/06/08: pathological fracture T10/11 w/ severe cord compression/edema; extended to T9 and confirmed on CT C/A/P 12/07/13 3. NO clear imaging evidence of malignancy  4. NSG following but not operative candidate 5. B Cx 12/07/13 NGTD 2  6. SPEP and serum free light chains pending 7. QuantiFERON goal assay pending 8. No matter the cause, I think this will  make successful outpt HD nearly impossible for her 9. Palliative care consult reasonable, discussed with Dr. Deitra Mayo 2. Large Right Pleural Effusion 1. Imaging today as above; no lung masses, mentions 2/2 thoracic process 2. Likely to need diagnostic thoracentesis 3. Pulm consulted by [facility] 3. ESRD: THS at [facility] via AVF 1. Continue on maintenance dialysis schedule,  2. As above for #1 4. HTN/Vol:  1. EDW 67.5kg at admission with post treatment blood pressure is remaining elevated 2. Meds  include amlodipine, metoprolol 3. Post BP at 67.5kg high on 10/31, challenge EDW; 5. Anemia of CKD:  1. On Aranesp as an outpatient with hemoglobin at goal. And elevated ferritin preclude use of IV iron. 2. We will hold Aranesp as inpatient given concern for active malignancy 3. Transfusion as necessary 6. 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 Calcium and phosphorus at goal in-house 2. PTH acceptable as outpatient   Pearson Grippe MD 12/08/2013, 1:17 PM   Recent Labs Lab 12/06/13 1617 12/06/13 1624 12/07/13 0604  NA 139 136* 138  K 3.7 3.4* 3.7  CL 94* 93* 95*  CO2 32  --  28  GLUCOSE 77 78 62*  BUN 22 22 26*  CREATININE 2.85* 2.90* 3.28*  CALCIUM 9.3  --  9.1  PHOS  --   --  4.2    Recent Labs Lab 12/06/13 1617 12/06/13 1624 12/07/13 0604  WBC 13.5*  --  10.3  HGB 10.5* 12.6 9.7*  HCT 33.0* 37.0 30.2*  MCV 97.6  --  94.4  PLT 496*  --  487*

## 2013-12-08 NOTE — Progress Notes (Signed)
PROGRESS NOTE  Holly Ellison OIN:[id number] DOB: 26-Oct-1940 DOA: 12/06/2013 PCP: [name], MD  HPI/Recap of past 76 hours: 73 year old female with past medical history of end-stage renal disease and intermittent compliance issues with dialysis who earlier this year underwent a decompressive thoracic laminectomy for unclear spinal process which was causing cord compression and edema presented to the emergency room on 10/30 with complaints of worsening shortness of breath times several days plus progression and loss of her right sided extremities. MRI thoracic spine demonstrated further spinal cord compression of vertebral destruction and seen by neurosurgery who felt that she would not be an operative candidate. On chest x-ray patient also noted to have large left-sided pleural effusion not present last month on x-ray. Initially when she was admitted, patient's mentation in question. Patient admitted to the hospitalist service. Nephrology consulted  Patient since yesterday continues to have episodes of hypoglycemia. Still quite irritable and easily upset. Still complains of pain on the right side of her chest.and in her lower back  Assessment/Plan: Active Problems:   Diabetes mellitus with episodes of hypoglycemia: stopped Lantus and change sliding scale to sensitive 3 times a day with meals with no at bedtime coverage. Patient with very poor by mouth intake    Hypertension    ESRD on dialysis: Seen by nephrology, getting dialysis today    Paraparesis: Non-operative candidate. Have spoken with palliative care who will see this weekend. In addition given her chronic pain, have managed by changing her twice a day instant release pain medication to scheduled every 12 hours extended release plus when necessary every 4 hour. Have added low-dose Duragesic patch to her lower back    Pleural effusion: CT scan notes large effusion and right lung obstructing majority of lung. Pulmonary consult and  plans for thoracentesis today or tomorrow    Altered mental state: Looks to be resolved. Unclear cause. Question hypoxia   hyperthyroidism: On Tapazole. TSH found to be elevated at 12 and free T4 suppressed. Patient already on Tapazole so will increase to 20 mg 3 times a day. Her hyperthyroidism may be contribute factor in her anxiety and stress   Code Status: Full code for now   Family Communication: left message with her son  Disposition Plan: Here until further disposition plan in regards to pleural effusion and palliative care set up   Consultants:  Neurosurgery  Nephrology  Palliative care   Procedures:  None   Antibiotics:  None    Objective: BP 160/66 mmHg  Pulse 103  Temp(Src) 98.3 F (36.8 C) (Oral)  Resp 22  Ht 5\' 7"  (1.702 m)  Wt 67.5 kg (148 lb 13 oz)  BMI 23.30 kg/m2  SpO2 97%  Intake/Output Summary (Last 24 hours) at 12/08/13 1358 Last data filed at 12/08/13 0900  Gross per 24 hour  Intake    480 ml  Output   1000 ml  Net   -520 ml   Filed Weights   12/07/13 0100 12/07/13 1110 12/07/13 1458  Weight: 67.8 kg (149 lb 7.6 oz) 68.5 kg (151 lb 0.2 oz) 67.5 kg (148 lb 13 oz)    Exam: Unchanged from yesterday  General:  Easily irritable, alert and oriented 3, mild distress secondary pain  Cardiovascular: Regular and rhythm, S1 and S2  Respiratory: Decreased breath sounds right side, otherwise clear  Abdomen: Soft, nontender, nondistended, hypoactive bowel sounds  Musculoskeletal: No clubbing or cyanosis or edema. Right sided paraparesis   Data Reviewed: Basic Metabolic Panel:  Recent Labs Lab  12/06/13 1617 12/06/13 1624 12/07/13 0604  NA 139 136* 138  K 3.7 3.4* 3.7  CL 94* 93* 95*  CO2 32  --  28  GLUCOSE 77 78 62*  BUN 22 22 26*  CREATININE 2.85* 2.90* 3.28*  CALCIUM 9.3  --  9.1  MG  --   --  2.5  PHOS  --   --  4.2   Liver Function Tests:  Recent Labs Lab 12/06/13 1617 12/07/13 0604  AST 29 25  ALT 23 19    ALKPHOS 155* 145*  BILITOT 0.5 0.5  PROT 7.6 7.0  ALBUMIN 2.2* 2.0*   No results for input(s): LIPASE, AMYLASE in the last 168 hours. No results for input(s): AMMONIA in the last 168 hours. CBC:  Recent Labs Lab 12/06/13 1617 12/06/13 1624 12/07/13 0604  WBC 13.5*  --  10.3  HGB 10.5* 12.6 9.7*  HCT 33.0* 37.0 30.2*  MCV 97.6  --  94.4  PLT 496*  --  487*   Cardiac Enzymes:    Recent Labs Lab 12/06/13 1713  TROPONINI <0.30   BNP (last 3 results) No results for input(s): PROBNP in the last 8760 hours. CBG:  Recent Labs Lab 12/08/13 0002 12/08/13 0433 12/08/13 0738 12/08/13 0819 12/08/13 1150  GLUCAP 86 84 64* 114* 87    Recent Results (from the past 240 hour(s))  MRSA PCR Screening     Status: None   Collection Time: 12/07/13  1:03 AM  Result Value Ref Range Status   MRSA by PCR NEGATIVE NEGATIVE Final    Comment:        The GeneXpert MRSA Assay (FDA approved for NASAL specimens only), is one component of a comprehensive MRSA colonization surveillance program. It is not intended to diagnose MRSA infection nor to guide or monitor treatment for MRSA infections.  Culture, blood (routine x 2)     Status: None (Preliminary result)   Collection Time: 12/07/13 10:52 AM  Result Value Ref Range Status   Specimen Description BLOOD RIGHT HAND  Final   Special Requests BOTTLES DRAWN AEROBIC ONLY 1CC  Final   Culture  Setup Time   Final    12/07/2013 20:10 Performed at [facility]    Culture   Final           BLOOD CULTURE RECEIVED NO GROWTH TO DATE CULTURE WILL BE HELD FOR 5 DAYS BEFORE ISSUING A FINAL NEGATIVE REPORT Performed at [facility]    Report Status PENDING  Incomplete  Culture, blood (routine x 2)     Status: None (Preliminary result)   Collection Time: 12/07/13 11:00 AM  Result Value Ref Range Status   Specimen Description BLOOD RIGHT HAND  Final   Special Requests BOTTLES DRAWN AEROBIC ONLY 5CC  Final   Culture  Setup Time    Final    12/07/2013 20:10 Performed at [facility]    Culture   Final           BLOOD CULTURE RECEIVED NO GROWTH TO DATE CULTURE WILL BE HELD FOR 5 DAYS BEFORE ISSUING A FINAL NEGATIVE REPORT Performed at [facility]    Report Status PENDING  Incomplete     Studies: Ct Chest W Contrast  12/07/2013   CLINICAL DATA:  Right pleural effusion.  Back pain.  EXAM: CT CHEST AND ABDOMEN WITH CONTRAST  TECHNIQUE: Multidetector CT imaging of the chest and abdomen was performed following the standard protocol during bolus administration of intravenous contrast.  CONTRAST:  180mL OMNIPAQUE IOHEXOL 300 MG/ML  SOLN  COMPARISON:  CT scans dated 07/17/2013 and 07/10/2013 and MRI of thoracic spine dated 12/06/2013  FINDINGS: CT CHEST FINDINGS  There is a large right pleural effusion with compressive atelectasis of the entire right lower lobe and of most of the right upper and middle lobes.  There is a tiny left effusion.  Heart size is normal.  There is a destructive process involving the T9, T10 and T11 vertebrae with extension of abnormal soft tissue into the spinal canal. There is prominent paraspinal soft tissue reaction. The patient has had previous posterior decompression at T10-11 and T11-12 and T12-L1.  The patient has developed diffuse anasarca. There is an 11 mm dense nodule in the left breast. The patient has had a previous left breast biopsy.  CT ABDOMEN FINDINGS  The liver, spleen, pancreas, adrenal glands, and kidneys appear normal. Biliary tree is normal. The bowel is normal. Ovaries are normal. Uterus has been removed. No acute abnormality of the lumbar spine.  IMPRESSION: 1. Progressive destructive process involving the T9, T10, and T11 vertebra. Extension of abnormal soft tissue into the spinal canal at that level which probably and compresses the spinal cord. 2. Secondary extensive soft tissue reaction in the adjacent soft tissues. 3. Large right pleural effusion with compressive  atelectasis the right lung. I suspect the effusion is due to the process in the lower thoracic spine. The biopsy at the time of decompression demonstrate no evidence of tumor. The possibility of focal amyloid deposition should be considered. Infection could also give this appearance although the biopsy did not demonstrate any evidence of infection at that time. 4. Negative CT scan of the abdomen.   Electronically Signed   By: Rozetta Nunnery M.D.   On: 12/07/2013 22:18   Ct Abdomen Pelvis W Contrast  12/07/2013   CLINICAL DATA:  Right pleural effusion.  Back pain.  EXAM: CT CHEST AND ABDOMEN WITH CONTRAST  TECHNIQUE: Multidetector CT imaging of the chest and abdomen was performed following the standard protocol during bolus administration of intravenous contrast.  CONTRAST:  18mL OMNIPAQUE IOHEXOL 300 MG/ML  SOLN  COMPARISON:  CT scans dated 07/17/2013 and 07/10/2013 and MRI of thoracic spine dated 12/06/2013  FINDINGS: CT CHEST FINDINGS  There is a large right pleural effusion with compressive atelectasis of the entire right lower lobe and of most of the right upper and middle lobes.  There is a tiny left effusion.  Heart size is normal.  There is a destructive process involving the T9, T10 and T11 vertebrae with extension of abnormal soft tissue into the spinal canal. There is prominent paraspinal soft tissue reaction. The patient has had previous posterior decompression at T10-11 and T11-12 and T12-L1.  The patient has developed diffuse anasarca. There is an 11 mm dense nodule in the left breast. The patient has had a previous left breast biopsy.  CT ABDOMEN FINDINGS  The liver, spleen, pancreas, adrenal glands, and kidneys appear normal. Biliary tree is normal. The bowel is normal. Ovaries are normal. Uterus has been removed. No acute abnormality of the lumbar spine.  IMPRESSION: 1. Progressive destructive process involving the T9, T10, and T11 vertebra. Extension of abnormal soft tissue into the spinal canal  at that level which probably and compresses the spinal cord. 2. Secondary extensive soft tissue reaction in the adjacent soft tissues. 3. Large right pleural effusion with compressive atelectasis the right lung. I suspect the effusion is due to the process in  the lower thoracic spine. The biopsy at the time of decompression demonstrate no evidence of tumor. The possibility of focal amyloid deposition should be considered. Infection could also give this appearance although the biopsy did not demonstrate any evidence of infection at that time. 4. Negative CT scan of the abdomen.   Electronically Signed   By: Rozetta Nunnery M.D.   On: 12/07/2013 22:18    Scheduled Meds: . ALPRAZolam  0.25 mg Oral TID  . amLODipine  5 mg Oral Daily  . aspirin EC  81 mg Oral Daily  . atorvastatin  40 mg Oral QHS  . bisacodyl  10 mg Rectal Daily  . collagenase   Topical Daily  . docusate sodium  100 mg Oral BID  . DULoxetine  30 mg Oral BID  . feeding supplement (RESOURCE BREEZE)  1 Container Oral TID BM  . fentaNYL  12.5 mcg Transdermal Q72H  . lanthanum  1,000 mg Oral TID WC  . methimazole  10 mg Oral TID  . metoprolol tartrate  25 mg Oral BID  . OxyCODONE  10 mg Oral Q12H  . pantoprazole  40 mg Oral Daily  . polyethylene glycol  17 g Oral Daily  . ranolazine  500 mg Oral BID  . sodium chloride  3 mL Intravenous Q12H  . sodium chloride  3 mL Intravenous Q12H  . topiramate  25 mg Oral Daily  . traZODone  50 mg Oral QHS    Continuous Infusions:    Time spent: 15 minutes   [facility] Pager [phone] . If 7PM-7AM, please contact night-coverage at [email or web address], password [facility] 12/08/2013, 1:58 PM  LOS: 2 days

## 2013-12-08 NOTE — Progress Notes (Signed)
Hypoglycemic Event  CBG: 64  Treatment: D50 IV 25 mL  Symptoms: Cool extremities    Follow-up CBG: Time:0915 CBG Result:114  Possible Reasons for Event:   Comments/MD notified MD aware     Holly Ellison  Remember to initiate Hypoglycemia Order Set & complete

## 2013-12-08 NOTE — Progress Notes (Addendum)
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 Progressive destructive process involving the T9, T10, and T11 vertebra. Extension of abnormal soft tissue into the spinal canal at that level which probably and compresses the spinal cord. 2. Secondary extensive soft tissue reaction in the adjacent soft tissues. 3. Large right pleural effusion with compressive atelectasis the right lung. I suspect the effusion is due to the process in the lower thoracic spine. The biopsy at the time of decompression demonstrate no evidence of tumor. The possibility of focal amyloid deposition should be considered. Infection could also give this appearance although the biopsy did not demonstrate any evidence of infection at that time. 4. Negative CT scan of the abdomen.   SIGNIFICANT EVENTS: Quantiferon gold 11/1 >>> R thoracentesis under u/s 11/2 >>>     HISTORY OF PRESENT ILLNESS:   SNF pt with paraplegia now and persistent mid back to slt sided pain worse with cough x 2 weeks but more comfortable since admit and placed on 02.  Onset was indolent/ progressive with non productive coughing.  No obvious day to day or daytime variabilty or assoc  cp or chest tightness, subjective wheeze overt  sinus or hb symptoms. No unusual exp hx or h/o childhood pna/ asthma or knowledge of premature birth.  Sleeping ok on 2 pillows without nocturnal  or early am exacerbation  of respiratory  c/o's or need for noct saba. Also denies any obvious fluctuation of symptoms with weather or environmental changes or other aggravating or alleviating factors except as outlined above   Current Medications, Allergies, Complete Past Medical History, Past Surgical History, Family History, and Social History were reviewed in [employer] record.  ROS  The following are not active complaints unless bolded sore throat, dysphagia, dental problems, itching, sneezing,  nasal congestion or excess/ purulent secretions, ear ache,   fever, chills, sweats, unintended wt loss, pleuritic or exertional cp, hemoptysis,  orthopnea pnd or leg swelling, presyncope, palpitations, heartburn, abdominal pain radiating to around T 9/11, anorexia, nausea, vomiting, diarrhea  or change in bowel or urinary habits, change in stools or urine, dysuria,hematuria,  rash, arthralgias, visual complaints, headache, numbness weakness or ataxia or problems with walking or coordination,  change in mood/affect or memory.         PAST MEDICAL HISTORY :   has a past medical history of Chronic kidney disease; Arthritis; Neuropathy in diabetes; Insomnia; Asthma; CAD (coronary artery disease) (2012); GERD (gastroesophageal reflux disease); Diabetes mellitus; COPD (chronic obstructive pulmonary disease); Hypertension; Peripheral vascular disease; Anemia; CHF (congestive heart failure); Coronary artery dilation; Myocardial infarction (2013); Colloid cyst of brain; H/O Bell's palsy; Frequent headaches; and Orthostasis.  has past surgical history that includes Abdominal hysterectomy; Coronary artery bypass  graft (2012); CEA; Carpal tunnel release; AV fistula placement; grave's disease; and Laminectomy (N/A, 07/16/2013). Prior to Admission  medications   Medication Sig Start Date End Date Taking? Authorizing Provider  acetaminophen (TYLENOL) 325 MG tablet Take 325-650 mg by mouth See admin instructions. Take 1 tablet (325 mg) every 4 hours as needed for mild pain, take 2 tablets (650 mg) every 4 hours as needed for severe pain   Yes Historical Provider, MD  aspirin EC 81 MG EC tablet Take 1 tablet (81 mg total) by mouth daily. 08/14/13  Yes Ivan Anchors Love, PA-C  atorvastatin (LIPITOR) 40 MG tablet Take 40 mg by mouth at bedtime.    Yes Historical Provider, MD  b complex-vitamin c-folic acid (NEPHRO-VITE) 0.8 MG TABS tablet Take 1 tablet by mouth daily.   Yes Historical Provider, MD  baclofen (LIORESAL) 10 MG tablet Take 10 mg by mouth every 12 (twelve) hours as needed for muscle spasms.   Yes Historical Provider, MD  bisacodyl (DULCOLAX) 10 MG suppository Place 1 suppository (10 mg total) rectally daily at 6 (six) AM. 08/14/13  Yes Ivan Anchors Love, PA-C  calcium carbonate (TUMS EX) 750 MG chewable tablet Chew 1 tablet (750 mg total) by mouth 3 (three) times daily as needed for heartburn. 08/14/13  Yes Ivan Anchors Love, PA-C  cholecalciferol (VITAMIN D) 1000 UNITS tablet Take 1,000 Units by mouth daily.   Yes Historical Provider, MD  clopidogrel (PLAVIX) 75 MG tablet Take 1 tablet (75 mg total) by mouth daily. 11/02/11  Yes Monika Salk, MD  diazepam (VALIUM) 10 MG tablet Take 10 mg by mouth once. Given prior to MRI   Yes Historical Provider, MD  docusate sodium (COLACE) 100 MG capsule Take 100 mg by mouth 2 (two) times daily. 8am and 4pm   Yes Historical Provider, MD  DULoxetine (CYMBALTA) 30 MG capsule Take 30 mg by mouth 2 (two) times daily. 8am and 4pm   Yes Historical Provider, MD  HYDROcodone-acetaminophen (NORCO) 10-325 MG per tablet Take 1 tablet by mouth every 6 (six) hours as needed (pain).   Yes Historical Provider, MD  insulin aspart (NOVOLOG) 100 unit/mL injection Inject 0-10 Units into the skin 3 (three) times daily before meals. Sliding  scale CBG <120 0 units, 121-150 3 units, 151-180 4 units, 181-250 6 units, 251-350 8 units, 351-400 10 units   Yes Historical Provider, MD  insulin glargine (LANTUS) 100 UNIT/ML injection Inject 0.1 mLs (10 Units total) into the skin daily. 08/14/13  Yes Ivan Anchors Love, PA-C  lidocaine (XYLOCAINE) 0.5 % injection Apply topically daily as needed (pain related to dialysis).   Yes Historical Provider, MD  LORazepam (ATIVAN) 0.5 MG tablet Take 0.5 mg by mouth See admin instructions. Take 1 tablet (0.5 mg) prior to dialysis on Tuesday, Thursday and Saturday for anxiety; and 1 tablet (0.5 mg) daily at bedtime, may also give 1 tablet (0.5 mg) every 4 hours as needed for anxiety.   Yes Historical Provider, MD  methimazole (TAPAZOLE) 10 MG tablet Take 10 mg by mouth 3 (three) times daily. 6am, 4pm, 10pm   Yes Historical Provider, MD  metoprolol tartrate (LOPRESSOR) 25 MG tablet Take 25 mg by mouth 2 (two) times daily. 8am, 4pm   Yes Historical Provider, MD  oxyCODONE (OXY IR/ROXICODONE) 5 MG immediate release tablet Take 5 mg by mouth every 6 (six) hours as needed (mild pain).   Yes Historical Provider, MD  pantoprazole (PROTONIX) 40 MG tablet Take 40 mg by mouth daily before  breakfast.    Yes Historical Provider, MD  polyethylene glycol (MIRALAX / GLYCOLAX) packet Take 17 g by mouth daily.   Yes Historical Provider, MD  PRESCRIPTION MEDICATION Take 120 mLs by mouth 3 (three) times daily with meals. NSA Medpass   Yes Historical Provider, MD  ranolazine (RANEXA) 500 MG 12 hr tablet Take 500 mg by mouth 2 (two) times daily. 8am and 4pm   Yes Historical Provider, MD  sorbitol 70 % solution Take 30 mLs by mouth 2 (two) times daily as needed (constipation).   Yes Historical Provider, MD  traZODone (DESYREL) 50 MG tablet Take 1 tablet (50 mg total) by mouth at bedtime. 08/14/13  Yes Ivan Anchors Love, PA-C  lidocaine (LIDODERM) 5 % Place 2 patches onto the skin daily. Remove & Discard patch within 12 hours or as directed by MD  08/14/13   Bary Leriche, PA-C   Allergies  Allergen Reactions  . Dilaudid [Hydromorphone Hcl] Itching  . Morphine Sulfate Itching  . Opium Itching  . Ciprofloxacin Hcl Itching  . Lexapro [Escitalopram Oxalate] Other (See Comments)    Mental status changes/lethargy  . Procainamide Hcl Other (See Comments)    Unknown reaction  . Robaxin [Methocarbamol] Other (See Comments)    lethargy    FAMILY HISTORY:  has no family status information on file.  SOCIAL HISTORY:  reports that she quit smoking about 17 years ago. Her smoking use included Cigarettes. She smoked 0.00 packs per day. She has never used smokeless tobacco. She reports that she does not drink alcohol or use illicit drugs.     SUBJECTIVE:  nad 30 degrees hob on 3lpm NP   VITAL SIGNS: Temp:  [98.3 F (36.8 C)-99.3 F (37.4 C)] 98.3 F (36.8 C) (11/01 0919) Pulse Rate:  [98-104] 103 (11/01 0430) Resp:  [20-22] 22 (11/01 0919) BP: (132-174)/(54-81) 160/66 mmHg (11/01 0919) SpO2:  [94 %-100 %] 97 % (11/01 0919) Weight:  [148 lb 13 oz (67.5 kg)] 148 lb 13 oz (67.5 kg) (10/31 1458) HEMODYNAMICS:   VENTILATOR SETTINGS:   INTAKE / OUTPUT:  Intake/Output Summary (Last 24 hours) at 12/08/13 1445 Last data filed at 12/08/13 0900  Gross per 24 hour  Intake    480 ml  Output   1000 ml  Net   -520 ml    PHYSICAL EXAMINATION: General: chronically ill bf nad at rest at 30 degrees hob on 3lpm Pt alert, approp   No jvd Oropharynx clear Neck supple Lungs with a few scattered exp > insp rhonchi bilaterally and decreased bs with dullness on R RRR no s3 or or sign murmur Abd obese soft  with poor  excursion   Extr wam with no edema or clubbing noted Neuro  Paraplegia noted   LABS:  CBC  Recent Labs Lab 12/06/13 1617 12/06/13 1624 12/07/13 0604  WBC 13.5*  --  10.3  HGB 10.5* 12.6 9.7*  HCT 33.0* 37.0 30.2*  PLT 496*  --  487*   Coag's  Recent Labs Lab 12/06/13 1617  INR 1.23   BMET  Recent Labs Lab  12/06/13 1617 12/06/13 1624 12/07/13 0604  NA 139 136* 138  K 3.7 3.4* 3.7  CL 94* 93* 95*  CO2 32  --  28  BUN 22 22 26*  CREATININE 2.85* 2.90* 3.28*  GLUCOSE 77 78 62*   Electrolytes  Recent Labs Lab 12/06/13 1617 12/07/13 0604  CALCIUM 9.3 9.1  MG  --  2.5  PHOS  --  4.2  Sepsis Markers  Recent Labs Lab 12/06/13 1625  LATICACIDVEN 2.02   ABG No results for input(s): PHART, PCO2ART, PO2ART in the last 168 hours. Liver Enzymes  Recent Labs Lab 12/06/13 1617 12/07/13 0604  AST 29 25  ALT 23 19  ALKPHOS 155* 145*  BILITOT 0.5 0.5  ALBUMIN 2.2* 2.0*   Cardiac Enzymes  Recent Labs Lab 12/06/13 1713  TROPONINI <0.30   Glucose  Recent Labs Lab 12/07/13 2005 12/08/13 0002 12/08/13 0433 12/08/13 0738 12/08/13 0819 12/08/13 1150  GLUCAP 77 86 84 64* 114* 87     ASSESSMENT / PLAN:  1)  Large R effusion contiguous with whatever process is causing [place] compression at the T9-11 area with ddx = malignancy vs TB vs other. Rec:  Tap under u/s supine in am (discusssed with IR/ Dr Kathlene Cote) and send for afb and fungal stain and culture as well as routine stain and culture, cytology, glucose, protein and LDH and cell count and diff  Discussed in detail all the  indications, usual  risks and alternatives  relative to the benefits with patient and son  who agrees to proceed with thoracentesis 11/2 - can't be done at bedside as can't sit up and could do chest tube at bedside if becomes more symptomatic but no tracheal deviation to indicate tension situation so ok to do in am.   Christinia Gully, MD Pulmonary and [place] [phone] After 5:30 PM or weekends, call [phone]

## 2013-12-09 ENCOUNTER — Inpatient Hospital Stay (HOSPITAL_COMMUNITY): Payer: Medicare Other

## 2013-12-09 LAB — GLUCOSE, SEROUS FLUID: Glucose, Fluid: 146 mg/dL

## 2013-12-09 LAB — BODY FLUID CELL COUNT WITH DIFFERENTIAL
Eos, Fluid: 0 %
Lymphs, Fluid: 2 %
Monocyte-Macrophage-Serous Fluid: 60 % (ref 50–90)
NEUTROPHIL FLUID: 38 % — AB (ref 0–25)
WBC FLUID: 39 uL (ref 0–1000)

## 2013-12-09 LAB — LACTATE DEHYDROGENASE, PLEURAL OR PERITONEAL FLUID: LD, Fluid: 90 U/L — ABNORMAL HIGH (ref 3–23)

## 2013-12-09 LAB — GLUCOSE, CAPILLARY
GLUCOSE-CAPILLARY: 178 mg/dL — AB (ref 70–99)
Glucose-Capillary: 111 mg/dL — ABNORMAL HIGH (ref 70–99)
Glucose-Capillary: 141 mg/dL — ABNORMAL HIGH (ref 70–99)
Glucose-Capillary: 227 mg/dL — ABNORMAL HIGH (ref 70–99)
Glucose-Capillary: 242 mg/dL — ABNORMAL HIGH (ref 70–99)
Glucose-Capillary: 98 mg/dL (ref 70–99)

## 2013-12-09 LAB — QUANTIFERON TB GOLD ASSAY (BLOOD)
Interferon Gamma Release Assay: NEGATIVE
Mitogen value: 1.28 IU/mL
Quantiferon Nil Value: 0.02 IU/mL
TB AG VALUE: 0.01 [IU]/mL
TB ANTIGEN MINUS NIL VALUE: 0 [IU]/mL

## 2013-12-09 LAB — KAPPA/LAMBDA LIGHT CHAINS
KAPPA FREE LGHT CHN: 18.7 mg/dL — AB (ref 0.33–1.94)
Kappa, lambda light chain ratio: 1.36 (ref 0.26–1.65)
Lambda free light chains: 13.7 mg/dL — ABNORMAL HIGH (ref 0.57–2.63)

## 2013-12-09 LAB — PROTEIN, BODY FLUID: Total protein, fluid: 4.7 g/dL

## 2013-12-09 MED ORDER — LIDOCAINE HCL (PF) 1 % IJ SOLN
INTRAMUSCULAR | Status: AC
  Filled 2013-12-09: qty 10

## 2013-12-09 MED ORDER — DARBEPOETIN ALFA 60 MCG/0.3ML IJ SOSY
50.0000 ug | PREFILLED_SYRINGE | INTRAMUSCULAR | Status: DC
  Administered 2013-12-10 – 2013-12-17 (×2): 50 ug via INTRAVENOUS
  Filled 2013-12-09 (×2): qty 0.3

## 2013-12-09 MED ORDER — RENA-VITE PO TABS
1.0000 | ORAL_TABLET | Freq: Every day | ORAL | Status: DC
  Administered 2013-12-09 – 2013-12-18 (×10): 1 via ORAL
  Filled 2013-12-09 (×11): qty 1

## 2013-12-09 MED ORDER — CETYLPYRIDINIUM CHLORIDE 0.05 % MT LIQD
7.0000 mL | Freq: Two times a day (BID) | OROMUCOSAL | Status: DC
  Administered 2013-12-09 (×2): 7 mL via OROMUCOSAL

## 2013-12-09 MED ORDER — ALPRAZOLAM 0.25 MG PO TABS
0.1250 mg | ORAL_TABLET | Freq: Two times a day (BID) | ORAL | Status: DC
  Administered 2013-12-09 – 2013-12-10 (×2): 0.125 mg via ORAL
  Filled 2013-12-09 (×3): qty 1

## 2013-12-09 MED ORDER — BOOST / RESOURCE BREEZE PO LIQD
1.0000 | Freq: Two times a day (BID) | ORAL | Status: DC
  Administered 2013-12-11 – 2013-12-19 (×14): 1 via ORAL

## 2013-12-09 MED ORDER — NEPRO/CARBSTEADY PO LIQD
237.0000 mL | Freq: Every day | ORAL | Status: DC
  Administered 2013-12-11 – 2013-12-18 (×6): 237 mL via ORAL

## 2013-12-09 NOTE — Procedures (Signed)
  US guided Rt thora 1.2 liters dark yellow fluid  Sent for labs per MD  Tolerated well cxr pending

## 2013-12-09 NOTE — Plan of Care (Signed)
Problem: Phase II Progression Outcomes Goal: IV changed to normal saline lock Outcome: Completed/Met Date Met:  12/09/13 Goal: Obtain order to discontinue catheter if appropriate Outcome: Not Applicable Date Met:  74/94/49

## 2013-12-09 NOTE — Progress Notes (Signed)
Patient Holly Ellison      DOB: 1940/09/01      FXT:[id number]   Palliative Medicine Team at [facility] Progress Note    Subjective: Not as interactive today. Only giving one word responses. Seems more drowsy. Wont let me examine back secondary to pain. No complaint of pain at rest.      Filed Vitals:   12/09/13 1049  BP: 163/72  Pulse: 95  Temp: 98.8 F (37.1 C)  Resp: 16   Physical exam: General: more drowsy, less interactive today HEENT: [place], sclera anicteric CV: RRR LUNGS: decreased sounds on right Abdomen: soft, ND Ext: No edema Skin: warm/dry  CBC    Component Value Date/Time   WBC 10.3 12/07/2013 0604   RBC 3.20* 12/07/2013 0604   RBC 2.66* 06/05/2011 0156   HGB 9.7* 12/07/2013 0604   HCT 30.2* 12/07/2013 0604   PLT 487* 12/07/2013 0604   MCV 94.4 12/07/2013 0604   MCH 30.3 12/07/2013 0604   MCHC 32.1 12/07/2013 0604   RDW 17.3* 12/07/2013 0604   LYMPHSABS 1.5 07/17/2013 1110   MONOABS 0.3 07/17/2013 1110   EOSABS 0.0 07/17/2013 1110   BASOSABS 0.0 07/17/2013 1110    CMP     Component Value Date/Time   NA 138 12/07/2013 0604   K 3.7 12/07/2013 0604   CL 95* 12/07/2013 0604   CO2 28 12/07/2013 0604   GLUCOSE 62* 12/07/2013 0604   BUN 26* 12/07/2013 0604   CREATININE 3.28* 12/07/2013 0604   CALCIUM 9.1 12/07/2013 0604   PROT 7.0 12/07/2013 0604   ALBUMIN 2.0* 12/07/2013 0604   AST 25 12/07/2013 0604   ALT 19 12/07/2013 0604   ALKPHOS 145* 12/07/2013 0604   BILITOT 0.5 12/07/2013 0604   GFRNONAA 13* 12/07/2013 0604   GFRAA 15* 12/07/2013 0604        Assessment and plan: 73 yo female with multiple medical problems as below who presented with SOB and weakness in setting of compression fractures of unknown etiology and new Right Pleural effusion.   1. Code Status: Full  2. Goals of Care: See initial consultation. She is less interactive, and as her son describes, I am sure her mentation waxes/wanes some.  To have thora today.  Son  not at bedside.  Will try and touch base with him today and tomorrow.  Would not suspect he will rapidly have change in goals. Diagnostic certainty may help though if we can indeed obtain it.    3. Symptom Management:  1. Back/Abdominal Pain- fentanyl patch, oxycontin, oxycodone.  Added dialysis dosed gabapentin for potential neuropathic component.  Will see how she does.  If stabilizes on regimen, I would recommend d/c oxycontin and increasing fentanyl patch by 12.48mcg to simplify her pain regimen. Will monitor.  2. Encephalopathy- i will attempt to wean benzos as able. Son concerned about benzo's and even reports bad reaction to SSRI. Monitor.   3. Chronic Constipation- Maintain current bowel regimen.  4. Psychosocial/Spiritual: Originally she is from [place]. Has 5 children but only her son [name] lives locally.   [name] D.O. Palliative Medicine Team at [facility]  Pager: [phone] Team Phone: [phone]

## 2013-12-09 NOTE — Progress Notes (Signed)
INITIAL NUTRITION ASSESSMENT  DOCUMENTATION CODES Per approved criteria  -Not Applicable   INTERVENTION: Provide Resource Breeze po BID, each supplement provides 250 kcal and 9 grams of protein.  Provide Nepro Shake po once daily, each supplement provides 425 kcal and 19 grams protein.  Encourage PO intake.  NUTRITION DIAGNOSIS: Inadequate oral intake related to decreased appetite as evidenced by meal completion of 0%.   Goal: Pt to meet >/= 90% of their estimated nutrition needs   Monitor:  PO intake, weight trends, labs, I/O's  Reason for Assessment: MST  73 y.o. female  Admitting Dx: Paraparesis  ASSESSMENT: Pt has a past medical history of Chronic kidney disease; Arthritis; Neuropathy in diabetes; Insomnia; Asthma; CAD (coronary artery disease) (2012); GERD (gastroesophageal reflux disease); Diabetes mellitus; COPD (chronic obstructive pulmonary disease); Hypertension; Peripheral vascular disease; Anemia; CHF (congestive heart failure); Coronary artery dilation; Myocardial infarction (2013); Colloid cyst of brain; H/O Bell's palsy; Frequent headaches; and Orthostasis.   Procedure 11/2- Thoracentesis  Pt reports having a decreased appetite. Meal completion is 0%. Son was at pt bedside and reports pt has had a decreased appetite for over the past 2 months. He reports pt has been eating 3 meals a day, but at times does not finish her meals. Son unable to quantify usual amount of food eaten. Son reports pt usually drinks Med Pass BID and also resource breeze daily. Pt reports she is willing to try Nepro. Will order. Pt was encouraged to eat her food at meals and to drink her supplements. Weight has been stable.  Pt with no observed significant fat or muscle mass loss.  Height: Ht Readings from Last 1 Encounters:  12/07/13 5\' 7"  (1.702 m)    Weight: Wt Readings from Last 1 Encounters:  12/08/13 151 lb 0.2 oz (68.5 kg)    Ideal Body Weight: 128 lbs-- adjusted for  paraplegia  % Ideal Body Weight: 112%  Wt Readings from Last 10 Encounters:  12/08/13 151 lb 0.2 oz (68.5 kg)  08/15/13 145 lb 11.6 oz (66.1 kg)  07/19/13 168 lb 3.4 oz (76.3 kg)  10/09/12 148 lb 2.4 oz (67.2 kg)  07/31/12 160 lb 15 oz (73 kg)  11/01/11 155 lb 3.3 oz (70.4 kg)  06/11/11 164 lb 7.4 oz (74.6 kg)  05/25/11 176 lb (79.833 kg)    Usual Body Weight: 155 lbs  % Usual Body Weight: 97%  BMI:  Body mass index is 23.65 kg/(m^2).  Estimated Nutritional Needs: Kcal: 1700-1900 Protein: 85-95 grams Fluid: 1.2 L/day  Skin: Stage II pressure ulcer on sacrum, unstagable pressure ulcer on heel; non-pitting LE edema  Diet Order: Diet regular  EDUCATION NEEDS: -No education needs identified at this time   Intake/Output Summary (Last 24 hours) at 12/09/13 1016 Last data filed at 12/08/13 1700  Gross per 24 hour  Intake      0 ml  Output      0 ml  Net      0 ml    Last BM: 11/1  Labs:   Recent Labs Lab 12/06/13 1617 12/06/13 1624 12/07/13 0604  NA 139 136* 138  K 3.7 3.4* 3.7  CL 94* 93* 95*  CO2 32  --  28  BUN 22 22 26*  CREATININE 2.85* 2.90* 3.28*  CALCIUM 9.3  --  9.1  MG  --   --  2.5  PHOS  --   --  4.2  GLUCOSE 77 78 62*    CBG (last 3)   Recent  Labs  12/09/13 0011 12/09/13 0417 12/09/13 0737  GLUCAP 242* 227* 178*    Scheduled Meds: . ALPRAZolam  0.25 mg Oral BID  . amLODipine  5 mg Oral Daily  . antiseptic oral rinse  7 mL Mouth Rinse BID  . aspirin EC  81 mg Oral Daily  . atorvastatin  40 mg Oral QHS  . bisacodyl  10 mg Rectal Daily  . collagenase   Topical Daily  . [START ON 12/10/2013] darbepoetin (ARANESP) injection - DIALYSIS  50 mcg Intravenous Q Tue-HD  . docusate sodium  100 mg Oral BID  . DULoxetine  30 mg Oral Daily  . feeding supplement (RESOURCE BREEZE)  1 Container Oral TID BM  . fentaNYL  12.5 mcg Transdermal Q72H  . gabapentin  100 mg Oral Once per day on Mon Wed Fri  . insulin aspart  0-9 Units Subcutaneous  TID WC  . lanthanum  1,000 mg Oral TID WC  . methimazole  20 mg Oral TID  . metoprolol tartrate  25 mg Oral BID  . multivitamin  1 tablet Oral QHS  . OxyCODONE  10 mg Oral Q12H  . pantoprazole  40 mg Oral Daily  . polyethylene glycol  17 g Oral Daily  . ranolazine  500 mg Oral BID  . sodium chloride  3 mL Intravenous Q12H  . sodium chloride  3 mL Intravenous Q12H  . topiramate  25 mg Oral Daily  . traZODone  50 mg Oral QHS    Continuous Infusions:   Past Medical History  Diagnosis Date  . Chronic kidney disease     just found out? Placed graft in [facility]-Dr. Tarri Glenn in [facility].  started dialysis in 06/2011  . Arthritis     Osteoarthritis  . Neuropathy in diabetes   . Insomnia   . Asthma   . CAD (coronary artery disease) 2012    7 stents-First PCI was in [place].  The last time she had a procedure was  a stent last year, thena  CABG was undertaken in October [place])  . GERD (gastroesophageal reflux disease)   . Diabetes mellitus     Type II  . COPD (chronic obstructive pulmonary disease)   . Hypertension   . Peripheral vascular disease     endarterectomy  . Anemia   . CHF (congestive heart failure)   . Coronary artery dilation   . Myocardial infarction 2013    x 3  . 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 H/O Bell's palsy   . Frequent headaches   . Orthostasis     Past Surgical History  Procedure Laterality Date  . Abdominal hysterectomy    . Coronary artery bypass graft  2012    7 stents  . Cea      Left  . Carpal tunnel release    . Av fistula placement    . Grave's disease      ? unclea rif ca or not   . Laminectomy N/A 07/16/2013    Procedure: THORACIC LAMINECTOMY FOR TUMOR;  Surgeon: Erline Levine, MD;  Location: [place];  Service: Neurosurgery;  Laterality: N/A;    Kallie Locks, MS, RD, LDN Pager # [phone] After hours/ weekend pager # [phone]

## 2013-12-09 NOTE — Progress Notes (Signed)
Subjective: Patient reports (only mumbles, occasionally nodding)  Objective: Vital signs in last 24 hours: Temp:  [98.2 F (36.8 C)-98.4 F (36.9 C)] 98.4 F (36.9 C) (11/02 0606) Pulse Rate:  [94-106] 94 (11/02 0606) Resp:  [20-23] 20 (11/02 0606) BP: (150-166)/(66-79) 161/79 mmHg (11/02 0606) SpO2:  [92 %-98 %] 98 % (11/02 0606) Weight:  [68.5 kg (151 lb 0.2 oz)] 68.5 kg (151 lb 0.2 oz) (11/01 2047)  Intake/Output from previous day:   Intake/Output this shift:    Pt opens eyes to voice, and attends with nurse's questions, responding with mumbles of "uh" and nods or says "no".  She is not conversant as with her office visit a couple weeks ago, but neither is she moaning c/o pain as reported yesterday. She denies sensation in legs to touch, unable to move legs to command. She also says "no" when asked if she felt me touching her arm; but she was holding the tv remote.   Lab Results:  Recent Labs  12/06/13 1617 12/06/13 1624 12/07/13 0604  WBC 13.5*  --  10.3  HGB 10.5* 12.6 9.7*  HCT 33.0* 37.0 30.2*  PLT 496*  --  487*   BMET  Recent Labs  12/06/13 1617 12/06/13 1624 12/07/13 0604  NA 139 136* 138  K 3.7 3.4* 3.7  CL 94* 93* 95*  CO2 32  --  28  GLUCOSE 77 78 62*  BUN 22 22 26*  CREATININE 2.85* 2.90* 3.28*  CALCIUM 9.3  --  9.1    Studies/Results: Ct Chest W Contrast  12/07/2013   CLINICAL DATA:  Right pleural effusion.  Back pain.  EXAM: CT CHEST AND ABDOMEN WITH CONTRAST  TECHNIQUE: Multidetector CT imaging of the chest and abdomen was performed following the standard protocol during bolus administration of intravenous contrast.  CONTRAST:  134mL OMNIPAQUE IOHEXOL 300 MG/ML  SOLN  COMPARISON:  CT scans dated 07/17/2013 and 07/10/2013 and MRI of thoracic spine dated 12/06/2013  FINDINGS: CT CHEST FINDINGS  There is a large right pleural effusion with compressive atelectasis of the entire right lower lobe and of most of the right upper and middle lobes.  There  is a tiny left effusion.  Heart size is normal.  There is a destructive process involving the T9, T10 and T11 vertebrae with extension of abnormal soft tissue into the spinal canal. There is prominent paraspinal soft tissue reaction. The patient has had previous posterior decompression at T10-11 and T11-12 and T12-L1.  The patient has developed diffuse anasarca. There is an 11 mm dense nodule in the left breast. The patient has had a previous left breast biopsy.  CT ABDOMEN FINDINGS  The liver, spleen, pancreas, adrenal glands, and kidneys appear normal. Biliary tree is normal. The bowel is normal. Ovaries are normal. Uterus has been removed. No acute abnormality of the lumbar spine.  IMPRESSION: 1. Progressive destructive process involving the T9, T10, and T11 vertebra. Extension of abnormal soft tissue into the spinal canal at that level which probably and compresses the spinal cord. 2. Secondary extensive soft tissue reaction in the adjacent soft tissues. 3. Large right pleural effusion with compressive atelectasis the right lung. I suspect the effusion is due to the process in the lower thoracic spine. The biopsy at the time of decompression demonstrate no evidence of tumor. The possibility of focal amyloid deposition should be considered. Infection could also give this appearance although the biopsy did not demonstrate any evidence of infection at that time. 4. Negative CT scan  of the abdomen.   Electronically Signed   By: Rozetta Nunnery M.D.   On: 12/07/2013 22:18   Ct Abdomen Pelvis W Contrast  12/07/2013   CLINICAL DATA:  Right pleural effusion.  Back pain.  EXAM: CT CHEST AND ABDOMEN WITH CONTRAST  TECHNIQUE: Multidetector CT imaging of the chest and abdomen was performed following the standard protocol during bolus administration of intravenous contrast.  CONTRAST:  159mL OMNIPAQUE IOHEXOL 300 MG/ML  SOLN  COMPARISON:  CT scans dated 07/17/2013 and 07/10/2013 and MRI of thoracic spine dated 12/06/2013   FINDINGS: CT CHEST FINDINGS  There is a large right pleural effusion with compressive atelectasis of the entire right lower lobe and of most of the right upper and middle lobes.  There is a tiny left effusion.  Heart size is normal.  There is a destructive process involving the T9, T10 and T11 vertebrae with extension of abnormal soft tissue into the spinal canal. There is prominent paraspinal soft tissue reaction. The patient has had previous posterior decompression at T10-11 and T11-12 and T12-L1.  The patient has developed diffuse anasarca. There is an 11 mm dense nodule in the left breast. The patient has had a previous left breast biopsy.  CT ABDOMEN FINDINGS  The liver, spleen, pancreas, adrenal glands, and kidneys appear normal. Biliary tree is normal. The bowel is normal. Ovaries are normal. Uterus has been removed. No acute abnormality of the lumbar spine.  IMPRESSION: 1. Progressive destructive process involving the T9, T10, and T11 vertebra. Extension of abnormal soft tissue into the spinal canal at that level which probably and compresses the spinal cord. 2. Secondary extensive soft tissue reaction in the adjacent soft tissues. 3. Large right pleural effusion with compressive atelectasis the right lung. I suspect the effusion is due to the process in the lower thoracic spine. The biopsy at the time of decompression demonstrate no evidence of tumor. The possibility of focal amyloid deposition should be considered. Infection could also give this appearance although the biopsy did not demonstrate any evidence of infection at that time. 4. Negative CT scan of the abdomen.   Electronically Signed   By: Rozetta Nunnery M.D.   On: 12/07/2013 22:18    Assessment/Plan:    LOS: 3 days  Upon review of MRI and paresis >2weeks, multiple comorbidities, Dr. Vertell Limber does not feel at this point surgery would be appropriate. He will visit later today as well. Hopeful of palliative care if pts family agrees. (Unsure of  reason for son's reported "no narcotics" request, but would encourage education.)   Verdis Prime 12/09/2013, 8:15 AM

## 2013-12-09 NOTE — Plan of Care (Signed)
Problem: Phase I Progression Outcomes Goal: Initial discharge plan identified Outcome: Completed/Met Date Met:  12/09/13 Goal: Voiding-avoid urinary catheter unless indicated Outcome: Completed/Met Date Met:  12/09/13

## 2013-12-09 NOTE — Progress Notes (Signed)
PROGRESS NOTE  Kayani Rapaport HCW:[id number] DOB: February 01, 1941 DOA: 12/06/2013 PCP: [name], MD  HPI/Recap of past 58 hours: 73 year old female with past medical history of end-stage renal disease and intermittent compliance issues with dialysis who earlier this year underwent a decompressive thoracic laminectomy for unclear spinal process which was causing cord compression and edema presented to the emergency room on 10/30 with complaints of worsening shortness of breath times several days plus progression and loss of her right sided extremities. MRI thoracic spine demonstrated further spinal cord compression of vertebral destruction and seen by neurosurgery who felt that she would not be an operative candidate. On chest x-ray patient also noted to have large left-sided pleural effusion not present last month on x-ray. Initially when she was admitted, patient's mentation in question. Patient admitted to the hospitalist service. Nephrology consulted  Seen by pulmonary and interventional radiology and underwent thoracentesis of the right lung with 1.2 L of fluid removed. Seen prior to tap and patient somewhat groggy, but pain seems to be better controlled.  Assessment/Plan: Active Problems:   Diabetes mellitus with episodes of hypoglycemia: very poor by mouth intake so Lantus stopped and only on sliding scale. CBGs better, at times elevated.    Hypertension    ESRD on dialysis: Seen by nephrology, received dialysis today    Paraparesis: Non-operative candidate. Have spoken with palliative care who will see this weekend. In addition given her chronic pain, have managed by changing her twice a day instant release pain medication to scheduled every 12 hours extended release plus when necessary every 4 hour. Have added low-dose Duragesic patch to her lower back    Pleural effusion: CT scan notes large effusion and right lung obstructing majority of lung. Pulmonary consult and plans for  thoracentesis today or tomorrow    Altered mental state: from admission, resolved. More somnolent today so have decreased scheduled Xanax   hyperthyroidism: On Tapazole. TSH found to be elevated at 12 and free T4 suppressed. Patient already on Tapazole so will increase to 20 mg 3 times a day. Her hyperthyroidism may be contribute factor in her anxiety and stress   Code Status: Full code for now   Family Communication: left message with her son  Disposition Plan: awaiting workup of her pleural effusion. Have involved palliative care and I suspect that despite issues with her spinal cord injury, we will not make much headway with her and the family in regards to comfort care.   Consultants:  Neurosurgery  Nephrology  Palliative care   Pulmonary  Interventional radiology  Procedures:  Status post thoracentesis done 11/2  Antibiotics:  None    Objective: BP 166/68 mmHg  Pulse 95  Temp(Src) 98.8 F (37.1 C) (Oral)  Resp 16  Ht 5\' 7"  (1.702 m)  Wt 68.5 kg (151 lb 0.2 oz)  BMI 23.65 kg/m2  SpO2 99%  Intake/Output Summary (Last 24 hours) at 12/09/13 1613 Last data filed at 12/08/13 1700  Gross per 24 hour  Intake      0 ml  Output      0 ml  Net      0 ml   Filed Weights   12/07/13 1110 12/07/13 1458 12/08/13 2047  Weight: 68.5 kg (151 lb 0.2 oz) 67.5 kg (148 lb 13 oz) 68.5 kg (151 lb 0.2 oz)    Exam:   General:  More somnolent, easily able to be awoken  Cardiovascular: Regular and rhythm, S1 and S2  Respiratory: Decreased breath sounds right side,  otherwise clear  Abdomen: Soft, nontender, nondistended, hypoactive bowel sounds  Musculoskeletal: No clubbing or cyanosis or edema. Right sided paraparesis   Data Reviewed: Basic Metabolic Panel:  Recent Labs Lab 12/06/13 1617 12/06/13 1624 12/07/13 0604  NA 139 136* 138  K 3.7 3.4* 3.7  CL 94* 93* 95*  CO2 32  --  28  GLUCOSE 77 78 62*  BUN 22 22 26*  CREATININE 2.85* 2.90* 3.28*  CALCIUM 9.3   --  9.1  MG  --   --  2.5  PHOS  --   --  4.2   Liver Function Tests:  Recent Labs Lab 12/06/13 1617 12/07/13 0604  AST 29 25  ALT 23 19  ALKPHOS 155* 145*  BILITOT 0.5 0.5  PROT 7.6 7.0  ALBUMIN 2.2* 2.0*   No results for input(s): LIPASE, AMYLASE in the last 168 hours. No results for input(s): AMMONIA in the last 168 hours. CBC:  Recent Labs Lab 12/06/13 1617 12/06/13 1624 12/07/13 0604  WBC 13.5*  --  10.3  HGB 10.5* 12.6 9.7*  HCT 33.0* 37.0 30.2*  MCV 97.6  --  94.4  PLT 496*  --  487*   Cardiac Enzymes:    Recent Labs Lab 12/06/13 1713  TROPONINI <0.30   BNP (last 3 results) No results for input(s): PROBNP in the last 8760 hours. CBG:  Recent Labs Lab 12/08/13 2030 12/09/13 0011 12/09/13 0417 12/09/13 0737 12/09/13 1208  GLUCAP 224* 242* 227* 178* 98    Recent Results (from the past 240 hour(s))  MRSA PCR Screening     Status: None   Collection Time: 12/07/13  1:03 AM  Result Value Ref Range Status   MRSA by PCR NEGATIVE NEGATIVE Final    Comment:        The GeneXpert MRSA Assay (FDA approved for NASAL specimens only), is one component of a comprehensive MRSA colonization surveillance program. It is not intended to diagnose MRSA infection nor to guide or monitor treatment for MRSA infections.  Culture, blood (routine x 2)     Status: None (Preliminary result)   Collection Time: 12/07/13 10:52 AM  Result Value Ref Range Status   Specimen Description BLOOD RIGHT HAND  Final   Special Requests BOTTLES DRAWN AEROBIC ONLY 1CC  Final   Culture  Setup Time   Final    12/07/2013 20:10 Performed at [facility]    Culture   Final           BLOOD CULTURE RECEIVED NO GROWTH TO DATE CULTURE WILL BE HELD FOR 5 DAYS BEFORE ISSUING A FINAL NEGATIVE REPORT Performed at [facility]    Report Status PENDING  Incomplete  Culture, blood (routine x 2)     Status: None (Preliminary result)   Collection Time: 12/07/13 11:00 AM    Result Value Ref Range Status   Specimen Description BLOOD RIGHT HAND  Final   Special Requests BOTTLES DRAWN AEROBIC ONLY 5CC  Final   Culture  Setup Time   Final    12/07/2013 20:10 Performed at [facility]    Culture   Final           BLOOD CULTURE RECEIVED NO GROWTH TO DATE CULTURE WILL BE HELD FOR 5 DAYS BEFORE ISSUING A FINAL NEGATIVE REPORT Performed at [facility]    Report Status PENDING  Incomplete     Studies: No results found.  Scheduled Meds: . ALPRAZolam  0.125 mg Oral BID  .  amLODipine  5 mg Oral Daily  . antiseptic oral rinse  7 mL Mouth Rinse BID  . aspirin EC  81 mg Oral Daily  . atorvastatin  40 mg Oral QHS  . bisacodyl  10 mg Rectal Daily  . collagenase   Topical Daily  . [START ON 12/10/2013] darbepoetin (ARANESP) injection - DIALYSIS  50 mcg Intravenous Q Tue-HD  . docusate sodium  100 mg Oral BID  . DULoxetine  30 mg Oral Daily  . feeding supplement (RESOURCE BREEZE)  1 Container Oral TID BM  . fentaNYL  12.5 mcg Transdermal Q72H  . gabapentin  100 mg Oral Once per day on Mon Wed Fri  . insulin aspart  0-9 Units Subcutaneous TID WC  . lanthanum  1,000 mg Oral TID WC  . lidocaine (PF)      . methimazole  20 mg Oral TID  . metoprolol tartrate  25 mg Oral BID  . multivitamin  1 tablet Oral QHS  . OxyCODONE  10 mg Oral Q12H  . pantoprazole  40 mg Oral Daily  . polyethylene glycol  17 g Oral Daily  . ranolazine  500 mg Oral BID  . sodium chloride  3 mL Intravenous Q12H  . sodium chloride  3 mL Intravenous Q12H  . topiramate  25 mg Oral Daily  . traZODone  50 mg Oral QHS    Continuous Infusions:    Time spent: 15 minutes   [facility] Pager [phone] . If 7PM-7AM, please contact night-coverage at [email or web address], password [facility] 12/09/2013, 4:13 PM  LOS: 3 days

## 2013-12-09 NOTE — Progress Notes (Signed)
PULMONARY / CRITICAL CARE MEDICINE   Name: Holly Ellison MRN: [id number] DOB: 1940/11/12    ADMISSION DATE:  12/06/2013 CONSULTATION DATE:  12/08/13  REFERRING MD :  [place] [facility]   CHIEF COMPLAINT:  sob  INITIAL PRESENTATION:  73 yo female former smoker developed back pain in June 2015 >> found to have T10/T11 lesion >> had laminectomy 07/16/13 >> pathology negative.  Developed progressive paraplegia and dyspnea.  Found to have lesions in T9/T10/T11 with cord compression and large Rt pleural effusion.  STUDIES:  CT chest 10/31 >> destructive process T9/T10/T11, cord compression, Large Rt pleural effusion Quantiferon gold 11/01 >>   SIGNIFICANT EVENTS: 10/30 Admit 11/02 R thoracentesis under u/s>>>   SUBJECTIVE:   VITAL SIGNS: Temp:  [98.2 F (36.8 C)-98.4 F (36.9 C)] 98.4 F (36.9 C) (11/02 0606) Pulse Rate:  [94-106] 94 (11/02 0606) Resp:  [20-23] 20 (11/02 0606) BP: (150-166)/(70-79) 161/79 mmHg (11/02 0606) SpO2:  [92 %-98 %] 98 % (11/02 0606) Weight:  [68.5 kg (151 lb 0.2 oz)] 68.5 kg (151 lb 0.2 oz) (11/01 2047) INTAKE / OUTPUT:  Intake/Output Summary (Last 24 hours) at 12/09/13 1034 Last data filed at 12/08/13 1700  Gross per 24 hour  Intake      0 ml  Output      0 ml  Net      0 ml    PHYSICAL EXAMINATION: General: Chronically ill appearing, resting in bed, in NAD. Neuro: Sleepy but easily arouseable.  A&O x 3.  Paraplegic HEENT: [place]/AT. PERRL, sclerae anicteric. Cardiovascular: RRR, no M/R/G.  Lungs:  Diminished on right, dull to percussion on right.  No W/R/R. Abdomen: BS x 4, soft, NT/ND.  Musculoskeletal: No gross deformities, no edema. Bilateral ankle boots in place. Skin: Intact, warm, no rashes.   LABS:  CBC  Recent Labs Lab 12/06/13 1617 12/06/13 1624 12/07/13 0604  WBC 13.5*  --  10.3  HGB 10.5* 12.6 9.7*  HCT 33.0* 37.0 30.2*  PLT 496*  --  487*   Coag's  Recent Labs Lab 12/06/13 1617  INR 1.23   BMET  Recent Labs Lab  12/06/13 1617 12/06/13 1624 12/07/13 0604  NA 139 136* 138  K 3.7 3.4* 3.7  CL 94* 93* 95*  CO2 32  --  28  BUN 22 22 26*  CREATININE 2.85* 2.90* 3.28*  GLUCOSE 77 78 62*   Electrolytes  Recent Labs Lab 12/06/13 1617 12/07/13 0604  CALCIUM 9.3 9.1  MG  --  2.5  PHOS  --  4.2   Sepsis Markers  Recent Labs Lab 12/06/13 1625  LATICACIDVEN 2.02   Liver Enzymes  Recent Labs Lab 12/06/13 1617 12/07/13 0604  AST 29 25  ALT 23 19  ALKPHOS 155* 145*  BILITOT 0.5 0.5  ALBUMIN 2.2* 2.0*   Cardiac Enzymes  Recent Labs Lab 12/06/13 1713  TROPONINI <0.30   Glucose  Recent Labs Lab 12/08/13 1150 12/08/13 1640 12/08/13 2030 12/09/13 0011 12/09/13 0417 12/09/13 0737  GLUCAP 87 239* 224* 242* 227* 178*     ASSESSMENT / PLAN:  Paraplegia, dyspnea in setting of Large Rt pleural effusion and destructive lesions in T9/T10/T11. Plan: IR consulted for thoracentesis Send fluid for glucose, LDH, protein, cell count, culture, AFB, and cytology. F/u CXR Neurosurgery following >> not surgical candidate at this time  Hx of ESRD. Plan: Per renal  Goals of care >> per primary team and palliative care team  Montey Hora, [place]  Pgr: [phone]  or [phone] 12/09/2013, 10:48 AM  Reviewed above, and examined.  Pt non verbal.  Decreased BS on Rt.  IR has been called to arrange for thoracentesis >> will need to send fluid for glucose, LDH, protein, cell count, culture, AFB, and cytology.  PCCM will f/u again after fluid analysis results available.  Chesley Mires, MD [facility] Pulmonary/Critical Care 12/09/2013, 12:44 PM Pager:  [phone] After 3pm call: [phone]

## 2013-12-09 NOTE — Progress Notes (Signed)
Subjective:   No complaints.   Objective Filed Vitals:   12/08/13 0919 12/08/13 1700 12/08/13 2047 12/09/13 0606  BP: 160/66 166/70 150/76 161/79  Pulse:   106 94  Temp: 98.3 F (36.8 C) 98.2 F (36.8 C) 98.2 F (36.8 C) 98.4 F (36.9 C)  TempSrc: Oral Oral Oral Oral  Resp: 22 23 22 20   Height:      Weight:   68.5 kg (151 lb 0.2 oz)   SpO2: 97% 98% 92% 98%   Physical Exam General: Very drowsy. Arousable. No acute distress. Nonverbal answers, nods head and smiles.  Heart: RRR Lungs: clear anterior. Unlabored  Abdomen: soft, nontender +BS  Extremities: R foot in boot. No edema bilat  Dialysis Access:  L AVF +b/t  Outpt HD Orders Unit: [place] [facility] Days: THS Time: 3h73min Dialyzer: F180 EDW: 67.5kg K/Ca: 2/2.25 Access: AVF, LUA Needle Size: 15 BFR/DFR: 400/1.5 UF Proflie: none VDRA: none EPO: Aranesp 11mcg qWk IV Fe: None Heparin: 4500 IVP qTx  Assessment/Plan: 1. 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 Blood cultures no growth. Head CT no acute abnormalitity. Not a surgical candidate. Palliative care following. Pain improved with oxycodone.  2. Large Right Pleural Effusion- on chest xray and CT. Pulm following. For R thoracentesis today. 2. ESRD - TTS [place]. Next HD tomorrow. Challenge EDW given pleural effusion. K+ 3.7 3. Anemia - hgb 9.7- cont aranesp 50 q week. No Fe.  4. Secondary hyperparathyroidism - phos 4.2- no binders. Ca+ 9.1 5. HTN/volume - 161/79 cont amlodipine and metop, challenge EDW 6. Nutrition -  Supplements. multivit. K+ 3.7 ok to keep on regular diet for now- watch K+   Shelle Iron, NP [place] [phone] 12/09/2013,9:10 AM  LOS: 3 days   Pt seen, examined and agree w A/P as above.  [name] MD pager [phone]    cell [phone] 12/09/2013, 12:41 PM     Additional Objective Labs: Basic Metabolic Panel:  Recent Labs Lab  12/06/13 1617 12/06/13 1624 12/07/13 0604  NA 139 136* 138  K 3.7 3.4* 3.7  CL 94* 93* 95*  CO2 32  --  28  GLUCOSE 77 78 62*  BUN 22 22 26*  CREATININE 2.85* 2.90* 3.28*  CALCIUM 9.3  --  9.1  PHOS  --   --  4.2   Liver Function Tests:  Recent Labs Lab 12/06/13 1617 12/07/13 0604  AST 29 25  ALT 23 19  ALKPHOS 155* 145*  BILITOT 0.5 0.5  PROT 7.6 7.0  ALBUMIN 2.2* 2.0*   No results for input(s): LIPASE, AMYLASE in the last 168 hours. CBC:  Recent Labs Lab 12/06/13 1617 12/06/13 1624 12/07/13 0604  WBC 13.5*  --  10.3  HGB 10.5* 12.6 9.7*  HCT 33.0* 37.0 30.2*  MCV 97.6  --  94.4  PLT 496*  --  487*   Blood Culture    Component Value Date/Time   SDES BLOOD RIGHT HAND 12/07/2013 1100   SPECREQUEST BOTTLES DRAWN AEROBIC ONLY 5CC 12/07/2013 1100   CULT  12/07/2013 1100           BLOOD CULTURE RECEIVED NO GROWTH TO DATE CULTURE WILL BE HELD FOR 5 DAYS BEFORE ISSUING A FINAL NEGATIVE REPORT Performed at [place] PENDING 12/07/2013 1100    Cardiac Enzymes:  Recent Labs Lab 12/06/13 1713  TROPONINI <0.30   CBG:  Recent Labs Lab  12/08/13 1640 12/08/13 2030 12/09/13 0011 12/09/13 0417 12/09/13 0737  GLUCAP 239* 224* 242* 227* 178*   Iron Studies: No results for input(s): IRON, TIBC, TRANSFERRIN, FERRITIN in the last 72 hours. @lablastinr3 @ Studies/Results: Ct Chest W Contrast  12/07/2013   CLINICAL DATA:  Right pleural effusion.  Back pain.  EXAM: CT CHEST AND ABDOMEN WITH CONTRAST  TECHNIQUE: Multidetector CT imaging of the chest and abdomen was performed following the standard protocol during bolus administration of intravenous contrast.  CONTRAST:  167mL OMNIPAQUE IOHEXOL 300 MG/ML  SOLN  COMPARISON:  CT scans dated 07/17/2013 and 07/10/2013 and MRI of thoracic spine dated 12/06/2013  FINDINGS: CT CHEST FINDINGS  There is a large right pleural effusion with compressive atelectasis of the entire right lower lobe and of  most of the right upper and middle lobes.  There is a tiny left effusion.  Heart size is normal.  There is a destructive process involving the T9, T10 and T11 vertebrae with extension of abnormal soft tissue into the spinal canal. There is prominent paraspinal soft tissue reaction. The patient has had previous posterior decompression at T10-11 and T11-12 and T12-L1.  The patient has developed diffuse anasarca. There is an 11 mm dense nodule in the left breast. The patient has had a previous left breast biopsy.  CT ABDOMEN FINDINGS  The liver, spleen, pancreas, adrenal glands, and kidneys appear normal. Biliary tree is normal. The bowel is normal. Ovaries are normal. Uterus has been removed. No acute abnormality of the lumbar spine.  IMPRESSION: 1. Progressive destructive process involving the T9, T10, and T11 vertebra. Extension of abnormal soft tissue into the spinal canal at that level which probably and compresses the spinal cord. 2. Secondary extensive soft tissue reaction in the adjacent soft tissues. 3. Large right pleural effusion with compressive atelectasis the right lung. I suspect the effusion is due to the process in the lower thoracic spine. The biopsy at the time of decompression demonstrate no evidence of tumor. The possibility of focal amyloid deposition should be considered. Infection could also give this appearance although the biopsy did not demonstrate any evidence of infection at that time. 4. Negative CT scan of the abdomen.   Electronically Signed   By: Rozetta Nunnery M.D.   On: 12/07/2013 22:18   Ct Abdomen Pelvis W Contrast  12/07/2013   CLINICAL DATA:  Right pleural effusion.  Back pain.  EXAM: CT CHEST AND ABDOMEN WITH CONTRAST  TECHNIQUE: Multidetector CT imaging of the chest and abdomen was performed following the standard protocol during bolus administration of intravenous contrast.  CONTRAST:  161mL OMNIPAQUE IOHEXOL 300 MG/ML  SOLN  COMPARISON:  CT scans dated 07/17/2013 and  07/10/2013 and MRI of thoracic spine dated 12/06/2013  FINDINGS: CT CHEST FINDINGS  There is a large right pleural effusion with compressive atelectasis of the entire right lower lobe and of most of the right upper and middle lobes.  There is a tiny left effusion.  Heart size is normal.  There is a destructive process involving the T9, T10 and T11 vertebrae with extension of abnormal soft tissue into the spinal canal. There is prominent paraspinal soft tissue reaction. The patient has had previous posterior decompression at T10-11 and T11-12 and T12-L1.  The patient has developed diffuse anasarca. There is an 11 mm dense nodule in the left breast. The patient has had a previous left breast biopsy.  CT ABDOMEN FINDINGS  The liver, spleen, pancreas, adrenal glands, and kidneys appear normal. Biliary tree  is normal. The bowel is normal. Ovaries are normal. Uterus has been removed. No acute abnormality of the lumbar spine.  IMPRESSION: 1. Progressive destructive process involving the T9, T10, and T11 vertebra. Extension of abnormal soft tissue into the spinal canal at that level which probably and compresses the spinal cord. 2. Secondary extensive soft tissue reaction in the adjacent soft tissues. 3. Large right pleural effusion with compressive atelectasis the right lung. I suspect the effusion is due to the process in the lower thoracic spine. The biopsy at the time of decompression demonstrate no evidence of tumor. The possibility of focal amyloid deposition should be considered. Infection could also give this appearance although the biopsy did not demonstrate any evidence of infection at that time. 4. Negative CT scan of the abdomen.   Electronically Signed   By: Rozetta Nunnery M.D.   On: 12/07/2013 22:18   Medications:   . ALPRAZolam  0.25 mg Oral BID  . amLODipine  5 mg Oral Daily  . antiseptic oral rinse  7 mL Mouth Rinse BID  . aspirin EC  81 mg Oral Daily  . atorvastatin  40 mg Oral QHS  . bisacodyl  10  mg Rectal Daily  . collagenase   Topical Daily  . docusate sodium  100 mg Oral BID  . DULoxetine  30 mg Oral Daily  . feeding supplement (RESOURCE BREEZE)  1 Container Oral TID BM  . fentaNYL  12.5 mcg Transdermal Q72H  . gabapentin  100 mg Oral Once per day on Mon Wed Fri  . insulin aspart  0-9 Units Subcutaneous TID WC  . lanthanum  1,000 mg Oral TID WC  . methimazole  20 mg Oral TID  . metoprolol tartrate  25 mg Oral BID  . OxyCODONE  10 mg Oral Q12H  . pantoprazole  40 mg Oral Daily  . polyethylene glycol  17 g Oral Daily  . ranolazine  500 mg Oral BID  . sodium chloride  3 mL Intravenous Q12H  . sodium chloride  3 mL Intravenous Q12H  . topiramate  25 mg Oral Daily  . traZODone  50 mg Oral QHS

## 2013-12-09 NOTE — Care Management Note (Signed)
CARE MANAGEMENT NOTE 12/09/2013  Patient:  Holly Ellison, Holly Ellison   Account Number:  [id number]  Date Initiated:  12/09/2013  Documentation initiated by:  Linde Wilensky  Subjective/Objective Assessment:   CM following for planning and progression.     Action/Plan:   Pt plan for SNF placement.   Anticipated DC Date:     Anticipated DC Plan:  SKILLED NURSING FACILITY         Choice offered to / List presented to:             Status of service:   Medicare Important Message given?  YES (If response is "NO", the following Medicare IM given date fields will be blank) Date Medicare IM given:  12/09/2013 Medicare IM given by:  Taimi Towe Date Additional Medicare IM given:   Additional Medicare IM given by:    Discharge Disposition:    Per UR Regulation:    If discussed at Long Length of Stay Meetings, dates discussed:    Comments:

## 2013-12-10 ENCOUNTER — Inpatient Hospital Stay (HOSPITAL_COMMUNITY): Payer: Medicare Other

## 2013-12-10 DIAGNOSIS — J9601 Acute respiratory failure with hypoxia: Secondary | ICD-10-CM

## 2013-12-10 DIAGNOSIS — R400 Somnolence: Secondary | ICD-10-CM | POA: Diagnosis present

## 2013-12-10 DIAGNOSIS — E1122 Type 2 diabetes mellitus with diabetic chronic kidney disease: Secondary | ICD-10-CM | POA: Diagnosis present

## 2013-12-10 LAB — CBC
HCT: 29.2 % — ABNORMAL LOW (ref 36.0–46.0)
Hemoglobin: 9.3 g/dL — ABNORMAL LOW (ref 12.0–15.0)
MCH: 30 pg (ref 26.0–34.0)
MCHC: 31.8 g/dL (ref 30.0–36.0)
MCV: 94.2 fL (ref 78.0–100.0)
Platelets: 460 10*3/uL — ABNORMAL HIGH (ref 150–400)
RBC: 3.1 MIL/uL — AB (ref 3.87–5.11)
RDW: 16.5 % — ABNORMAL HIGH (ref 11.5–15.5)
WBC: 10.6 10*3/uL — ABNORMAL HIGH (ref 4.0–10.5)

## 2013-12-10 LAB — PROTEIN ELECTROPHORESIS, SERUM
ALPHA-2-GLOBULIN: 17 % — AB (ref 7.1–11.8)
Albumin ELP: 37 % — ABNORMAL LOW (ref 55.8–66.1)
Alpha-1-Globulin: 9.2 % — ABNORMAL HIGH (ref 2.9–4.9)
Beta 2: 9.3 % — ABNORMAL HIGH (ref 3.2–6.5)
Beta Globulin: 5.1 % (ref 4.7–7.2)
GAMMA GLOBULIN: 22.4 % — AB (ref 11.1–18.8)
M-Spike, %: NOT DETECTED g/dL
Total Protein ELP: 6.2 g/dL (ref 6.0–8.3)

## 2013-12-10 LAB — RENAL FUNCTION PANEL
Albumin: 2 g/dL — ABNORMAL LOW (ref 3.5–5.2)
Anion gap: 14 (ref 5–15)
BUN: 30 mg/dL — AB (ref 6–23)
CO2: 27 meq/L (ref 19–32)
Calcium: 8.6 mg/dL (ref 8.4–10.5)
Chloride: 90 mEq/L — ABNORMAL LOW (ref 96–112)
Creatinine, Ser: 4.45 mg/dL — ABNORMAL HIGH (ref 0.50–1.10)
GFR calc non Af Amer: 9 mL/min — ABNORMAL LOW (ref >90)
GFR, EST AFRICAN AMERICAN: 11 mL/min — AB (ref >90)
Glucose, Bld: 156 mg/dL — ABNORMAL HIGH (ref 70–99)
Phosphorus: 5.2 mg/dL — ABNORMAL HIGH (ref 2.3–4.6)
Potassium: 5 mEq/L (ref 3.7–5.3)
Sodium: 131 mEq/L — ABNORMAL LOW (ref 137–147)

## 2013-12-10 LAB — GLUCOSE, CAPILLARY
GLUCOSE-CAPILLARY: 257 mg/dL — AB (ref 70–99)
GLUCOSE-CAPILLARY: 288 mg/dL — AB (ref 70–99)
Glucose-Capillary: 119 mg/dL — ABNORMAL HIGH (ref 70–99)

## 2013-12-10 LAB — QUANTIFERON TB GOLD ASSAY (BLOOD)
Interferon Gamma Release Assay: NEGATIVE
Mitogen value: 3.58 IU/mL
Quantiferon Nil Value: 0.01 IU/mL
TB AG VALUE: 0.01 [IU]/mL
TB Antigen Minus Nil Value: 0 IU/mL

## 2013-12-10 MED ORDER — LIDOCAINE HCL (PF) 1 % IJ SOLN: 5.0000 mL | INTRAMUSCULAR | Status: DC | PRN

## 2013-12-10 MED ORDER — HEPARIN SODIUM (PORCINE) 1000 UNIT/ML DIALYSIS: 1000.0000 [IU] | INTRAMUSCULAR | Status: DC | PRN

## 2013-12-10 MED ORDER — LIDOCAINE-PRILOCAINE 2.5-2.5 % EX CREA: 1.0000 "application " | TOPICAL_CREAM | CUTANEOUS | Status: DC | PRN

## 2013-12-10 MED ORDER — NEPRO/CARBSTEADY PO LIQD: 237.0000 mL | ORAL | Status: DC | PRN

## 2013-12-10 MED ORDER — ALTEPLASE 2 MG IJ SOLR
2.0000 mg | Freq: Once | INTRAMUSCULAR | Status: DC | PRN
  Filled 2013-12-10: qty 2

## 2013-12-10 MED ORDER — SODIUM CHLORIDE 0.9 % IV SOLN: 100.0000 mL | INTRAVENOUS | Status: DC | PRN

## 2013-12-10 MED ORDER — DARBEPOETIN ALFA 60 MCG/0.3ML IJ SOSY
PREFILLED_SYRINGE | INTRAMUSCULAR | Status: AC
  Administered 2013-12-10: 50 ug via INTRAVENOUS
  Filled 2013-12-10: qty 0.3

## 2013-12-10 MED ORDER — PENTAFLUOROPROP-TETRAFLUOROETH EX AERO: 1.0000 "application " | INHALATION_SPRAY | CUTANEOUS | Status: DC | PRN

## 2013-12-10 MED ORDER — ALPRAZOLAM 0.25 MG PO TABS
0.1250 mg | ORAL_TABLET | Freq: Every day | ORAL | Status: DC | PRN
  Administered 2013-12-14 – 2013-12-18 (×5): 0.125 mg via ORAL
  Filled 2013-12-10 (×5): qty 1

## 2013-12-10 NOTE — Progress Notes (Signed)
Patient [id number] Tisby      DOB: Jun 24, 1940      QZE:[id number]   Palliative Medicine Team at [facility] Progress Note    Subjective: Confused today. Does not remember going to dialysis this morning.  Reports pain better.  No nausea. Appetite poor.  Son [name] at bedside. Refused meds after HD.     Filed Vitals:   12/10/13 1120  BP: 109/51  Pulse: 99  Temp: 97.8 F (36.6 C)  Resp: 16   Physical exam: GEN: alert, confused HEENT: [place], sclera anicteric CV: RRR LUNGS: decreased on right ABD: soft, ND EXT: no edema   CBC    Component Value Date/Time   WBC 10.6* 12/10/2013 0719   RBC 3.10* 12/10/2013 0719   RBC 2.66* 06/05/2011 0156   HGB 9.3* 12/10/2013 0719   HCT 29.2* 12/10/2013 0719   PLT 460* 12/10/2013 0719   MCV 94.2 12/10/2013 0719   MCH 30.0 12/10/2013 0719   MCHC 31.8 12/10/2013 0719   RDW 16.5* 12/10/2013 0719   LYMPHSABS 1.5 07/17/2013 1110   MONOABS 0.3 07/17/2013 1110   EOSABS 0.0 07/17/2013 1110   BASOSABS 0.0 07/17/2013 1110    CMP     Component Value Date/Time   NA 131* 12/10/2013 0719   K 5.0 12/10/2013 0719   CL 90* 12/10/2013 0719   CO2 27 12/10/2013 0719   GLUCOSE 156* 12/10/2013 0719   BUN 30* 12/10/2013 0719   CREATININE 4.45* 12/10/2013 0719   CALCIUM 8.6 12/10/2013 0719   PROT 7.0 12/07/2013 0604   ALBUMIN 2.0* 12/10/2013 0719   AST 25 12/07/2013 0604   ALT 19 12/07/2013 0604   ALKPHOS 145* 12/07/2013 0604   BILITOT 0.5 12/07/2013 0604   GFRNONAA 9* 12/10/2013 0719   GFRAA 11* 12/10/2013 0719      Assessment and plan: 73 yo female with multiple medical problems as below who presented with SOB and weakness in setting of compression fractures of unknown etiology and new Right Pleural effusion.   1. Code Status: Full  2. Goals of Care: See initial consultation  Spoke again with [place] today.  He expressed frustrations again about pain medicines though seemingly okay with continuing. Also a lot of frustration about  routine care. Feels like she does not get enough assistance with taking meds (why she is refusing), changing clothes, checking bed, eating, etc.  He wishes she was back at her SNF because he feels like they know her better there and know her routine. Same goes for dialysis. He speculates that she would not be saying to stop dialysis if she were at her home unit.  In discussion with CM, complicating factor is if she can sit up and be transported to dialysis.  This has reportedly been problem here.   I attempted to re-focus [place] on the larger picture.  He frankly admits that she has been doing worse over past 2-3 months. Uses reasons as above as explanations for why.  I discussed my concern that there is also undiagnosed problem in back and pleural fluid that I worry is almost certainly going to be a bad prognosticator for her.  I asked [name] how he thought she would be doing in a few weeks or month from now and he acknowledges fear that she may be doing worse.  I expressed that he may be facing question of stopping dialysis because of this decline, but I did not push conversation further as he was at least silently thinking about this and did  not get upset with me positing this.  I still do not feel like he would take a recommendation of comfort care well.  We will continue to follow along as able. My partner Dr Hilma Favors will be on service tomorrow.   Awaiting pleural fluid analysis which appears exudative by TP.    3. Symptom Management:  1. Back/Abdominal Pain- [name] okay with current regimen for now Suspect he may request removal of fentanyl patch. Patient appears more comfortable today.  2. Encephalopathy- worse again today. Agree with plan to wean benzo's as being done by Dr [place] Pink.  Does not remember even going to dialysis today.   3. Chronic Constipation- Maintain current bowel regimen.  4. Psychosocial/Spiritual: Originally she is from [place]. Has 5 children but only her son  [name] lives locally.   Total time 45 minutes  >50% of time spent in counseling and coordination of care. Discussed with CM as well.  [name] D.O. Palliative Medicine Team at [facility]  Pager: [phone] Team Phone: [phone]

## 2013-12-10 NOTE — Progress Notes (Signed)
PROGRESS NOTE  Holly Ellison ZOX:[id number] DOB: 1940/10/14 DOA: 12/06/2013 PCP: [name], MD  HPI/Recap of past 74 hours: 73 year old female with past medical history of end-stage renal disease and intermittent compliance issues with dialysis who earlier this year underwent a decompressive thoracic laminectomy for unclear spinal process which was causing cord compression and edema presented to the emergency room on 10/30 with complaints of worsening shortness of breath times several days plus progression and loss of her right sided extremities. MRI thoracic spine demonstrated further spinal cord compression of vertebral destruction and seen by neurosurgery who felt that she would not be an operative candidate. On chest x-ray patient also noted to have large left-sided pleural effusion not present last month on x-ray. Initially when she was admitted, patient's mentation in question. Patient admitted to the hospitalist service. Nephrology consulted  Seen by pulmonary and interventional radiology and underwent thoracentesis of the right lung with 1.2 L of fluid removed. Patient has also been followed by palliative care but deciding on goals of care has been complicated due to family issues and patient's wavering mentation which may or may not be chronic.  Patient seen in dialysis today. Groggy. No complaints and actually states that her breathing is better  Assessment/Plan: Active Problems:   Diabetes mellitus with episodes of hypoglycemia: very poor by mouth intake so Lantus stopped and only on sliding scale, sensitive with no night coverage. CBGs have since improved    Hypertension:blood pressures at times soft, especially after dialysis    ESRD on dialysis: Seen by nephrology, received dialysis today    Paraparesis: Non-operative candidate. Have spoken with palliative care who will see this weekend. In addition given her chronic pain, have managed by changing her twice a day instant  release pain medication to scheduled every 12 hours extended release plus when necessary every 4 hour. Have added low-dose Duragesic patch to her lower back.  This may be making her too groggy and so I have eliminated her Xanax scheduled and if grogginess persists, would favor discontinuing fentanyl patch instead of oxy ER   acute respiratory failure with hypoxia secondary to Pleural effusion: CT scan notes large effusion and right lung obstructing majority of lung. Status post thoracentesis done by interventional radiology, workup pending. Respiratory failure much improved    Altered mental state: from admission, resolved. More somnolent today so have decreased scheduled Xanax   hyperthyroidism: On Tapazole. TSH found to be elevated at 12 and free T4 suppressed. Patient already on Tapazole so will increase to 20 mg 3 times a day. Her hyperthyroidism may be contribute factor in her anxiety and stress   Code Status: Full code for now   Family Communication: left message with her son  Disposition Plan: awaiting workup of her pleural effusion. Have involved palliative care and I suspect that despite issues with her spinal cord injury, we will not make much headway with her and the family in regards to comfort care.  Would like to see mentation improved prior to discharge   Consultants:  Neurosurgery  Nephrology  Palliative care   Pulmonary  Interventional radiology  Procedures:  Status post thoracentesis done 11/2  Antibiotics:  None    Objective: BP 109/51 mmHg  Pulse 99  Temp(Src) 97.8 F (36.6 C) (Oral)  Resp 16  Ht 5\' 7"  (1.702 m)  Wt 68.1 kg (150 lb 2.1 oz)  BMI 23.51 kg/m2  SpO2 97%  Intake/Output Summary (Last 24 hours) at 12/10/13 1703 Last data filed at 12/10/13 1045  Gross per 24 hour  Intake      0 ml  Output   1331 ml  Net  -1331 ml   Filed Weights   12/09/13 2008 12/10/13 0704 12/10/13 1045  Weight: 68.55 kg (151 lb 2 oz) 69.1 kg (152 lb 5.4 oz) 68.1  kg (150 lb 2.1 oz)    Exam:   General:  Drowsy, no acute distress  Cardiovascular: Regular and rhythm, S1 and S2  Respiratory: Decreased breath sounds right side, otherwise clear  Abdomen: Soft, nontender, nondistended, hypoactive bowel sounds  Musculoskeletal: No clubbing or cyanosis or edema. Right sided paraparesis   Data Reviewed: Basic Metabolic Panel:  Recent Labs Lab 12/06/13 1617 12/06/13 1624 12/07/13 0604 12/10/13 0719  NA 139 136* 138 131*  K 3.7 3.4* 3.7 5.0  CL 94* 93* 95* 90*  CO2 32  --  28 27  GLUCOSE 77 78 62* 156*  BUN 22 22 26* 30*  CREATININE 2.85* 2.90* 3.28* 4.45*  CALCIUM 9.3  --  9.1 8.6  MG  --   --  2.5  --   PHOS  --   --  4.2 5.2*   Liver Function Tests:  Recent Labs Lab 12/06/13 1617 12/07/13 0604 12/10/13 0719  AST 29 25  --   ALT 23 19  --   ALKPHOS 155* 145*  --   BILITOT 0.5 0.5  --   PROT 7.6 7.0  --   ALBUMIN 2.2* 2.0* 2.0*   No results for input(s): LIPASE, AMYLASE in the last 168 hours. No results for input(s): AMMONIA in the last 168 hours. CBC:  Recent Labs Lab 12/06/13 1617 12/06/13 1624 12/07/13 0604 12/10/13 0719  WBC 13.5*  --  10.3 10.6*  HGB 10.5* 12.6 9.7* 9.3*  HCT 33.0* 37.0 30.2* 29.2*  MCV 97.6  --  94.4 94.2  PLT 496*  --  487* 460*   Cardiac Enzymes:    Recent Labs Lab 12/06/13 1713  TROPONINI <0.30   BNP (last 3 results) No results for input(s): PROBNP in the last 8760 hours. CBG:  Recent Labs Lab 12/09/13 0737 12/09/13 1208 12/09/13 1705 12/09/13 2013 12/10/13 1118  GLUCAP 178* 98 111* 141* 119*    Recent Results (from the past 240 hour(s))  MRSA PCR Screening     Status: None   Collection Time: 12/07/13  1:03 AM  Result Value Ref Range Status   MRSA by PCR NEGATIVE NEGATIVE Final    Comment:        The GeneXpert MRSA Assay (FDA approved for NASAL specimens only), is one component of a comprehensive MRSA colonization surveillance program. It is not intended to  diagnose MRSA infection nor to guide or monitor treatment for MRSA infections.  Culture, blood (routine x 2)     Status: None (Preliminary result)   Collection Time: 12/07/13 10:52 AM  Result Value Ref Range Status   Specimen Description BLOOD RIGHT HAND  Final   Special Requests BOTTLES DRAWN AEROBIC ONLY 1CC  Final   Culture  Setup Time   Final    12/07/2013 20:10 Performed at [facility]    Culture   Final           BLOOD CULTURE RECEIVED NO GROWTH TO DATE CULTURE WILL BE HELD FOR 5 DAYS BEFORE ISSUING A FINAL NEGATIVE REPORT Performed at [facility]    Report Status PENDING  Incomplete  Culture, blood (routine x 2)     Status: None (Preliminary result)  Collection Time: 12/07/13 11:00 AM  Result Value Ref Range Status   Specimen Description BLOOD RIGHT HAND  Final   Special Requests BOTTLES DRAWN AEROBIC ONLY 5CC  Final   Culture  Setup Time   Final    12/07/2013 20:10 Performed at [facility]    Culture   Final           BLOOD CULTURE RECEIVED NO GROWTH TO DATE CULTURE WILL BE HELD FOR 5 DAYS BEFORE ISSUING A FINAL NEGATIVE REPORT Performed at [facility]    Report Status PENDING  Incomplete  Fungus Culture with Smear     Status: None (Preliminary result)   Collection Time: 12/09/13  1:28 PM  Result Value Ref Range Status   Specimen Description FLUID RIGHT PLEURAL  Final   Special Requests NONE  Final   Fungal Smear   Final    NO YEAST OR FUNGAL ELEMENTS SEEN Performed at [facility]    Culture   Final    CULTURE IN PROGRESS FOR FOUR WEEKS Performed at [facility]    Report Status PENDING  Incomplete  Body fluid culture     Status: None (Preliminary result)   Collection Time: 12/09/13  1:28 PM  Result Value Ref Range Status   Specimen Description FLUID RIGHT PLEURAL  Final   Special Requests NONE  Final   Gram Stain   Final    RARE WBC PRESENT, PREDOMINANTLY MONONUCLEAR NO ORGANISMS SEEN Performed at  [facility]    Culture   Final    NO GROWTH 1 DAY Performed at [facility]    Report Status PENDING  Incomplete  AFB culture with smear     Status: None (Preliminary result)   Collection Time: 12/09/13  1:28 PM  Result Value Ref Range Status   Specimen Description FLUID RIGHT PLEURAL  Final   Special Requests NONE  Final   Acid Fast Smear   Final    NO ACID FAST BACILLI SEEN Performed at [facility]    Culture   Final    CULTURE WILL BE EXAMINED FOR 6 WEEKS BEFORE ISSUING A FINAL REPORT Performed at [facility]    Report Status PENDING  Incomplete     Studies: Dg Chest 1 View  12/09/2013   CLINICAL DATA:  Post thoracentesis.  Back pain  EXAM: CHEST - 1 VIEW  COMPARISON:  12/06/2013  FINDINGS: Decreasing right pleural effusion following right thoracentesis. Small right pleural effusion persists. Airspace disease throughout the right lung which could represent edema or infection. Increasing airspace disease on the left. Heart is borderline in size. No visible left effusion. No pneumothorax following thoracentesis.  IMPRESSION: Decreasing right effusion following thoracentesis.  No pneumothorax.  Bilateral airspace disease, right greater than left. This is increased on the left since prior study. This could represent asymmetric edema or infection.   Electronically Signed   By: Rolm Baptise M.D.   On: 12/09/2013 13:51   US Thoracentesis Asp Pleural Space W/img Guide  12/09/2013   CLINICAL DATA:  Right pleural effusion  EXAM: ULTRASOUND GUIDED right THORACENTESIS  COMPARISON:  None.  PROCEDURE: An ultrasound guided thoracentesis was thoroughly discussed with the patient and questions answered. The benefits, risks, alternatives and complications were also discussed. The patient understands and wishes to proceed with the procedure. Written consent was obtained.  Ultrasound was performed to localize and mark an adequate pocket of fluid in the right chest. The  area was then  prepped and draped in the normal sterile fashion. 1% Lidocaine was used for local anesthesia. Under ultrasound guidance a 19 gauge Yueh catheter was introduced. Thoracentesis was performed. The catheter was removed and a dressing applied.  Complications:  None  FINDINGS: A total of approximately 1.2 L of dark yellow fluid was removed. A fluid sample wassent for laboratory analysis.  IMPRESSION: Successful ultrasound guided right thoracentesis yielding 1.2 L of pleural fluid.  Read by:  Lavonia Drafts [facility]   Electronically Signed   By: Daryll Brod M.D.   On: 12/09/2013 14:32    Scheduled Meds: . amLODipine  5 mg Oral Daily  . aspirin EC  81 mg Oral Daily  . atorvastatin  40 mg Oral QHS  . bisacodyl  10 mg Rectal Daily  . collagenase   Topical Daily  . darbepoetin (ARANESP) injection - DIALYSIS  50 mcg Intravenous Q Tue-HD  . docusate sodium  100 mg Oral BID  . DULoxetine  30 mg Oral Daily  . feeding supplement (NEPRO CARB STEADY)  237 mL Oral Q1500  . feeding supplement (RESOURCE BREEZE)  1 Container Oral BID BM  . fentaNYL  12.5 mcg Transdermal Q72H  . gabapentin  100 mg Oral Once per day on Mon Wed Fri  . insulin aspart  0-9 Units Subcutaneous TID WC  . lanthanum  1,000 mg Oral TID WC  . methimazole  20 mg Oral TID  . metoprolol tartrate  25 mg Oral BID  . multivitamin  1 tablet Oral QHS  . OxyCODONE  10 mg Oral Q12H  . pantoprazole  40 mg Oral Daily  . polyethylene glycol  17 g Oral Daily  . ranolazine  500 mg Oral BID  . sodium chloride  3 mL Intravenous Q12H  . topiramate  25 mg Oral Daily  . traZODone  50 mg Oral QHS    Continuous Infusions:    Time spent: 15 minutes   [facility] Pager [phone] . If 7PM-7AM, please contact night-coverage at [email or web address], password [facility] 12/10/2013, 5:02 PM  LOS: 4 days

## 2013-12-10 NOTE — Progress Notes (Signed)
[facility] Progress Note   Subjective: "I want to come off the machine!"  Filed Vitals:   12/10/13 1000 12/10/13 1030 12/10/13 1045 12/10/13 1120  BP: 100/46 95/43 119/56 109/51  Pulse: 92 93 99 99  Temp:   98.1 F (36.7 C) 97.8 F (36.6 C)  TempSrc:    Oral  Resp:   14 16  Height:      Weight:   68.1 kg (150 lb 2.1 oz)   SpO2:   100% 97%   Exam: Awake, no distress, wants to come off HD machine No jvd Chest clear bilat RRR soft SEM no RG Abd soft, NTND No LE edema, L foot in boot L arm AVF +bruit Neuro is nf, alert  HD: TTS [place] 3h 31min  67.5kg  2/2.25 [place]  LUA AVF   Heparin 4500 Aranesp 50 ug q thurs, no other meds       Assessment: 1 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 Now with clinical worsening and MRI showing further spinal cord compression and vertebral destruction. Seen by NSurg, not operative candidate. Blood cultures no growth. Palliative care following. Pain improved with oxycodone.  2 Large Right Pleural Effusion- on chest xray and CT. Pulm following. S/p thoracentesis 11/2 3 ESRD - TTS HD 4 Anemia - hgb 9.7- cont aranesp 50 q week. No Fe.  5 Secondary hyperparathyroidism - phos 4.2- no binders. Ca+ 9.1 6 HTN/volume - 161/79 cont amlodipine and metop, challenge EDW 7 Nutrition - Supplements  Plan - HD today  [name] MD  pager [phone]    cell [phone]  12/10/2013, 12:02 PM     Recent Labs Lab 12/06/13 1617 12/06/13 1624 12/07/13 0604 12/10/13 0719  NA 139 136* 138 131*  K 3.7 3.4* 3.7 5.0  CL 94* 93* 95* 90*  CO2 32  --  28 27  GLUCOSE 77 78 62* 156*  BUN 22 22 26* 30*  CREATININE 2.85* 2.90* 3.28* 4.45*  CALCIUM 9.3  --  9.1 8.6  PHOS  --   --  4.2 5.2*    Recent Labs Lab 12/06/13 1617 12/07/13 0604 12/10/13 0719  AST 29 25  --   ALT 23 19  --   ALKPHOS 155* 145*  --   BILITOT 0.5 0.5  --   PROT 7.6 7.0  --   ALBUMIN 2.2* 2.0*  2.0*    Recent Labs Lab 12/06/13 1617 12/06/13 1624 12/07/13 0604 12/10/13 0719  WBC 13.5*  --  10.3 10.6*  HGB 10.5* 12.6 9.7* 9.3*  HCT 33.0* 37.0 30.2* 29.2*  MCV 97.6  --  94.4 94.2  PLT 496*  --  487* 460*   . ALPRAZolam  0.125 mg Oral BID  . amLODipine  5 mg Oral Daily  . aspirin EC  81 mg Oral Daily  . atorvastatin  40 mg Oral QHS  . bisacodyl  10 mg Rectal Daily  . collagenase   Topical Daily  . darbepoetin (ARANESP) injection - DIALYSIS  50 mcg Intravenous Q Tue-HD  . docusate sodium  100 mg Oral BID  . DULoxetine  30 mg Oral Daily  . feeding supplement (NEPRO CARB STEADY)  237 mL Oral Q1500  . feeding supplement (RESOURCE BREEZE)  1 Container Oral BID BM  . fentaNYL  12.5 mcg Transdermal Q72H  . gabapentin  100 mg Oral Once per day on Mon Wed Fri  . insulin aspart  0-9 Units  Subcutaneous TID WC  . lanthanum  1,000 mg Oral TID WC  . methimazole  20 mg Oral TID  . metoprolol tartrate  25 mg Oral BID  . multivitamin  1 tablet Oral QHS  . OxyCODONE  10 mg Oral Q12H  . pantoprazole  40 mg Oral Daily  . polyethylene glycol  17 g Oral Daily  . ranolazine  500 mg Oral BID  . sodium chloride  3 mL Intravenous Q12H  . topiramate  25 mg Oral Daily  . traZODone  50 mg Oral QHS     acetaminophen **OR** acetaminophen, albuterol, baclofen, dextrose, hydrALAZINE, ondansetron **OR** ondansetron (ZOFRAN) IV, oxyCODONE

## 2013-12-10 NOTE — Clinical Documentation Improvement (Signed)
O2 sats on arrival on room air 83% and 88%; HR 90's and respirations 19-25.  Respiratory distress per EMS.  Documented encephalopathy, hypoxia, pleural effusion requiring thoracentesis. Please clarify if the hypoxia can be further clarified and document in progress note and carry over to the discharge summary.  Possible Clinical Conditions: -acute respiratory failure with hypoxia -hypoxia only -other -Unable to determine at present  Thank you, Mateo Flow, RN [phone] Clinical Documentation Specialist

## 2013-12-10 NOTE — Progress Notes (Signed)
PROGRESS NOTE  Holly Ellison WSF:[id number] DOB: [birth year] DOA: 12/06/2013 PCP: [name], MD  HPI/Recap of past 61 hours: 73 year old female with past medical history of end-stage renal disease and intermittent compliance issues with dialysis who earlier this year underwent a decompressive thoracic laminectomy for unclear spinal process which was causing cord compression and edema presented to the emergency room on 10/30 with complaints of worsening shortness of breath times several days plus progression and loss of her right sided extremities. MRI thoracic spine demonstrated further spinal cord compression of vertebral destruction and seen by neurosurgery who felt that she would not be an operative candidate. On chest x-ray patient also noted to have large left-sided pleural effusion not present last month on x-ray. Initially when she was admitted, patient's mentation in question. Patient admitted to the hospitalist service. Nephrology consulted  Seen by pulmonary and interventional radiology and underwent thoracentesis of the right lung with 1.2 L of fluid removed. Patient has also been followed by palliative care but deciding on goals of care has been complicated due to family issues and patient's wavering mentation which may or may not be chronic.  Patient seen in dialysis today. Groggy. No complaints and actually states that her breathing is better  Assessment/Plan: Active Problems:   Diabetes mellitus with episodes of hypoglycemia: very poor by mouth intake so Lantus stopped and only on sliding scale, sensitive with no night coverage. CBGs have since improved    Hypertension:blood pressures at times soft, especially after dialysis    ESRD on dialysis: Seen by nephrology, received dialysis today    Paraparesis: Non-operative candidate. Have spoken with palliative care who will see this weekend. In addition given her chronic pain, have managed by changing her twice a day instant  release pain medication to scheduled every 12 hours extended release plus when necessary every 4 hour. Have added low-dose Duragesic patch to her lower back.  This may be making her too groggy and so I have eliminated her Xanax scheduled and if grogginess persists, would favor discontinuing fentanyl patch instead of oxy ER    Pleural effusion: CT scan notes large effusion and right lung obstructing majority of lung. Status post thoracentesis done by interventional radiology, workup pending    Altered mental state: from admission, resolved. More somnolent today so have decreased scheduled Xanax   hyperthyroidism: On Tapazole. TSH found to be elevated at 12 and free T4 suppressed. Patient already on Tapazole so will increase to 20 mg 3 times a day. Her hyperthyroidism may be contribute factor in her anxiety and stress   Code Status: Full code for now   Family Communication: left message with her son  Disposition Plan: awaiting workup of her pleural effusion. Have involved palliative care and I suspect that despite issues with her spinal cord injury, we will not make much headway with her and the family in regards to comfort care.  Would like to see mentation improved prior to discharge   Consultants:  Neurosurgery  Nephrology  Palliative care   Pulmonary  Interventional radiology  Procedures:  Status post thoracentesis done 11/2  Antibiotics:  None    Objective: BP 109/51 mmHg  Pulse 99  Temp(Src) 97.8 F (36.6 C) (Oral)  Resp 16  Ht 5\' 7"  (1.702 m)  Wt 68.1 kg (150 lb 2.1 oz)  BMI 23.51 kg/m2  SpO2 97%  Intake/Output Summary (Last 24 hours) at 12/10/13 1659 Last data filed at 12/10/13 1045  Gross per 24 hour  Intake  0 ml  Output   1331 ml  Net  -1331 ml   Filed Weights   12/09/13 2008 12/10/13 0704 12/10/13 1045  Weight: 68.55 kg (151 lb 2 oz) 69.1 kg (152 lb 5.4 oz) 68.1 kg (150 lb 2.1 oz)    Exam:   General:  Drowsy, no acute  distress  Cardiovascular: Regular and rhythm, S1 and S2  Respiratory: Decreased breath sounds right side, otherwise clear  Abdomen: Soft, nontender, nondistended, hypoactive bowel sounds  Musculoskeletal: No clubbing or cyanosis or edema. Right sided paraparesis   Data Reviewed: Basic Metabolic Panel:  Recent Labs Lab 12/06/13 1617 12/06/13 1624 12/07/13 0604 12/10/13 0719  NA 139 136* 138 131*  K 3.7 3.4* 3.7 5.0  CL 94* 93* 95* 90*  CO2 32  --  28 27  GLUCOSE 77 78 62* 156*  BUN 22 22 26* 30*  CREATININE 2.85* 2.90* 3.28* 4.45*  CALCIUM 9.3  --  9.1 8.6  MG  --   --  2.5  --   PHOS  --   --  4.2 5.2*   Liver Function Tests:  Recent Labs Lab 12/06/13 1617 12/07/13 0604 12/10/13 0719  AST 29 25  --   ALT 23 19  --   ALKPHOS 155* 145*  --   BILITOT 0.5 0.5  --   PROT 7.6 7.0  --   ALBUMIN 2.2* 2.0* 2.0*   No results for input(s): LIPASE, AMYLASE in the last 168 hours. No results for input(s): AMMONIA in the last 168 hours. CBC:  Recent Labs Lab 12/06/13 1617 12/06/13 1624 12/07/13 0604 12/10/13 0719  WBC 13.5*  --  10.3 10.6*  HGB 10.5* 12.6 9.7* 9.3*  HCT 33.0* 37.0 30.2* 29.2*  MCV 97.6  --  94.4 94.2  PLT 496*  --  487* 460*   Cardiac Enzymes:    Recent Labs Lab 12/06/13 1713  TROPONINI <0.30   BNP (last 3 results) No results for input(s): PROBNP in the last 8760 hours. CBG:  Recent Labs Lab 12/09/13 0737 12/09/13 1208 12/09/13 1705 12/09/13 2013 12/10/13 1118  GLUCAP 178* 98 111* 141* 119*    Recent Results (from the past 240 hour(s))  MRSA PCR Screening     Status: None   Collection Time: 12/07/13  1:03 AM  Result Value Ref Range Status   MRSA by PCR NEGATIVE NEGATIVE Final    Comment:        The GeneXpert MRSA Assay (FDA approved for NASAL specimens only), is one component of a comprehensive MRSA colonization surveillance program. It is not intended to diagnose MRSA infection nor to guide or monitor treatment for MRSA  infections.  Culture, blood (routine x 2)     Status: None (Preliminary result)   Collection Time: 12/07/13 10:52 AM  Result Value Ref Range Status   Specimen Description BLOOD RIGHT HAND  Final   Special Requests BOTTLES DRAWN AEROBIC ONLY 1CC  Final   Culture  Setup Time   Final    12/07/2013 20:10 Performed at [facility]    Culture   Final           BLOOD CULTURE RECEIVED NO GROWTH TO DATE CULTURE WILL BE HELD FOR 5 DAYS BEFORE ISSUING A FINAL NEGATIVE REPORT Performed at [facility]    Report Status PENDING  Incomplete  Culture, blood (routine x 2)     Status: None (Preliminary result)   Collection Time: 12/07/13 11:00 AM  Result Value Ref Range  Status   Specimen Description BLOOD RIGHT HAND  Final   Special Requests BOTTLES DRAWN AEROBIC ONLY 5CC  Final   Culture  Setup Time   Final    12/07/2013 20:10 Performed at [facility]    Culture   Final           BLOOD CULTURE RECEIVED NO GROWTH TO DATE CULTURE WILL BE HELD FOR 5 DAYS BEFORE ISSUING A FINAL NEGATIVE REPORT Performed at [facility]    Report Status PENDING  Incomplete  Fungus Culture with Smear     Status: None (Preliminary result)   Collection Time: 12/09/13  1:28 PM  Result Value Ref Range Status   Specimen Description FLUID RIGHT PLEURAL  Final   Special Requests NONE  Final   Fungal Smear   Final    NO YEAST OR FUNGAL ELEMENTS SEEN Performed at [facility]    Culture   Final    CULTURE IN PROGRESS FOR FOUR WEEKS Performed at [facility]    Report Status PENDING  Incomplete  Body fluid culture     Status: None (Preliminary result)   Collection Time: 12/09/13  1:28 PM  Result Value Ref Range Status   Specimen Description FLUID RIGHT PLEURAL  Final   Special Requests NONE  Final   Gram Stain   Final    RARE WBC PRESENT, PREDOMINANTLY MONONUCLEAR NO ORGANISMS SEEN Performed at [facility]    Culture   Final    NO GROWTH 1 DAY Performed  at [facility]    Report Status PENDING  Incomplete  AFB culture with smear     Status: None (Preliminary result)   Collection Time: 12/09/13  1:28 PM  Result Value Ref Range Status   Specimen Description FLUID RIGHT PLEURAL  Final   Special Requests NONE  Final   Acid Fast Smear   Final    NO ACID FAST BACILLI SEEN Performed at [facility]    Culture   Final    CULTURE WILL BE EXAMINED FOR 6 WEEKS BEFORE ISSUING A FINAL REPORT Performed at [facility]    Report Status PENDING  Incomplete     Studies: Dg Chest 1 View  12/09/2013   CLINICAL DATA:  Post thoracentesis.  Back pain  EXAM: CHEST - 1 VIEW  COMPARISON:  12/06/2013  FINDINGS: Decreasing right pleural effusion following right thoracentesis. Small right pleural effusion persists. Airspace disease throughout the right lung which could represent edema or infection. Increasing airspace disease on the left. Heart is borderline in size. No visible left effusion. No pneumothorax following thoracentesis.  IMPRESSION: Decreasing right effusion following thoracentesis.  No pneumothorax.  Bilateral airspace disease, right greater than left. This is increased on the left since prior study. This could represent asymmetric edema or infection.   Electronically Signed   By: Rolm Baptise M.D.   On: 12/09/2013 13:51   US Thoracentesis Asp Pleural Space W/img Guide  12/09/2013   CLINICAL DATA:  Right pleural effusion  EXAM: ULTRASOUND GUIDED right THORACENTESIS  COMPARISON:  None.  PROCEDURE: An ultrasound guided thoracentesis was thoroughly discussed with the patient and questions answered. The benefits, risks, alternatives and complications were also discussed. The patient understands and wishes to proceed with the procedure. Written consent was obtained.  Ultrasound was performed to localize and mark an adequate pocket of fluid in the right chest. The area was then prepped and draped in the normal sterile fashion. 1%  Lidocaine  was used for local anesthesia. Under ultrasound guidance a 19 gauge Yueh catheter was introduced. Thoracentesis was performed. The catheter was removed and a dressing applied.  Complications:  None  FINDINGS: A total of approximately 1.2 L of dark yellow fluid was removed. A fluid sample wassent for laboratory analysis.  IMPRESSION: Successful ultrasound guided right thoracentesis yielding 1.2 L of pleural fluid.  Read by:  Lavonia Drafts [facility]   Electronically Signed   By: Daryll Brod M.D.   On: 12/09/2013 14:32    Scheduled Meds: . amLODipine  5 mg Oral Daily  . aspirin EC  81 mg Oral Daily  . atorvastatin  40 mg Oral QHS  . bisacodyl  10 mg Rectal Daily  . collagenase   Topical Daily  . darbepoetin (ARANESP) injection - DIALYSIS  50 mcg Intravenous Q Tue-HD  . docusate sodium  100 mg Oral BID  . DULoxetine  30 mg Oral Daily  . feeding supplement (NEPRO CARB STEADY)  237 mL Oral Q1500  . feeding supplement (RESOURCE BREEZE)  1 Container Oral BID BM  . fentaNYL  12.5 mcg Transdermal Q72H  . gabapentin  100 mg Oral Once per day on Mon Wed Fri  . insulin aspart  0-9 Units Subcutaneous TID WC  . lanthanum  1,000 mg Oral TID WC  . methimazole  20 mg Oral TID  . metoprolol tartrate  25 mg Oral BID  . multivitamin  1 tablet Oral QHS  . OxyCODONE  10 mg Oral Q12H  . pantoprazole  40 mg Oral Daily  . polyethylene glycol  17 g Oral Daily  . ranolazine  500 mg Oral BID  . sodium chloride  3 mL Intravenous Q12H  . topiramate  25 mg Oral Daily  . traZODone  50 mg Oral QHS    Continuous Infusions:    Time spent: 15 minutes   [facility] Pager [phone] . If 7PM-7AM, please contact night-coverage at [email or web address], password [facility] 12/10/2013, 4:59 PM  LOS: 4 days

## 2013-12-10 NOTE — Procedures (Signed)
I was present at this dialysis session, have reviewed the session itself and made  appropriate changes  [name] MD (pgr) [phone]    ([phone] 12/10/2013, 12:02 PM

## 2013-12-10 NOTE — Progress Notes (Signed)
Pt back from HD. Refusing meds, full physical assessment.

## 2013-12-10 NOTE — Plan of Care (Signed)
Problem: Phase III Progression Outcomes Goal: Foley discontinued Outcome: Not Applicable Date Met:  12/10/13     

## 2013-12-11 DIAGNOSIS — R404 Transient alteration of awareness: Secondary | ICD-10-CM

## 2013-12-11 DIAGNOSIS — S24109D Unspecified injury at unspecified level of thoracic spinal cord, subsequent encounter: Secondary | ICD-10-CM

## 2013-12-11 LAB — GLUCOSE, CAPILLARY
Glucose-Capillary: 187 mg/dL — ABNORMAL HIGH (ref 70–99)
Glucose-Capillary: 227 mg/dL — ABNORMAL HIGH (ref 70–99)
Glucose-Capillary: 201 mg/dL — ABNORMAL HIGH (ref 70–99)

## 2013-12-11 NOTE — Progress Notes (Signed)
[facility] PROGRESS NOTE  Rosaleigh Brazzel IRJ:[id number] DOB: 28-Jan-1941 DOA: 12/06/2013 PCP: [name], MD  Assessment/Plan: 1. 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  2. Chronic back pain- patient has chronic back pain from the progressive pathologic fracture of the T10-11 vertebrae, continue baclofen 10 mg every 12 hours when necessary,fentanyl patch 12.5 g every 72 hours, Neurontin 100 mg 3 times weekly.  3. Pleural effusion- CT chest showed large right pleural effusion, thoracentesis was performed and cytology reveals mixed inflammatory cells, reactive appearing mesothelial cells.pulmonary is following, and recommended repeat thoracentesis with a repeat cytology. At this time patient is refusing any further procedures.  4. Suicidal ideation- patient complained of suicidal ideation today, one is to one sitter, and psych consult obtained. 5. End-stage renal disease- patient is currently on hemodialysis, and is refusing to undergo further hemodialysis. Discussed with Dr. Jonnie Finner,  .[name] is at this time he would not who says that he would not put this patient for hemodialysis at this time. 6. Hyperthyroidism- Continue Tapazole, the dose has now increased to 20 mg po TID 7. Diabetes Mellitus-     Code Status: Full code Family Communication: Discussed with patient;s son at bedside Disposition Plan:  SNF   Consultants:  Nephrology  Neurology   Palliative care  Procedures:  Thoracentesis  Antibiotics:  None  HPI/Subjective: 73 year old female with past medical history of end-stage renal disease and intermittent compliance issues with dialysis who earlier this year underwent a decompressive thoracic laminectomy for unclear spinal process which was  causing cord compression and edema presented to the emergency room on 10/30 with complaints of worsening shortness of breath times several days plus progression and loss of her right sided extremities. MRI thoracic spine demonstrated further spinal cord compression of vertebral destruction and seen by neurosurgery who felt that she would not be an operative candidate. On chest x-ray patient also noted to have large left-sided pleural effusion not present last month on x-ray. Initially when she was admitted, patient's mentation in question. Patient admitted to the hospitalist service. Nephrology consulted Seen by pulmonary and interventional radiology and underwent thoracentesis of the right lung with 1.2 L of fluid removed. Patient has also been followed by palliative care but deciding on goals of care has been complicated due to family issues and patient's wavering mentation which may or may not be chronic.  This morning, she has been complaining of back pain, and is refusing repeat thoracentesis, hemodialysis. Also complains of suicidal thoughts.   Objective: Filed Vitals:   12/11/13 0500  BP: 118/60  Pulse: 99  Temp: 98.4 F (36.9 C)  Resp: 18    Intake/Output Summary (Last 24 hours) at 12/11/13 1733 Last data filed at 12/11/13 1300  Gross per 24 hour  Intake    660 ml  Output      0 ml  Net    660 ml   Filed Weights   12/10/13 0704 12/10/13 1045 12/10/13 2112  Weight: 69.1 kg (152 lb 5.4 oz) 68.1 kg (150 lb 2.1 oz) 68.099 kg (150 lb 2.1 oz)    Exam:  Physical Exam: Head: Normocephalic, atraumatic.  Eyes: No signs of jaundice, EOMI Lungs: Normal respiratory effort. B/L Clear to auscultation, no crackles or wheezes.  Heart: Regular RR. S1 and S2 normal  Abdomen: BS normoactive. Soft, Nondistended,  non-tender.  Extremities: No pretibial edema, no erythema   Data Reviewed: Basic Metabolic Panel:  Recent Labs Lab 12/06/13 1617 12/06/13 1624 12/07/13 0604 12/10/13 0719   NA 139 136* 138 131*  K 3.7 3.4* 3.7 5.0  CL 94* 93* 95* 90*  CO2 32  --  28 27  GLUCOSE 77 78 62* 156*  BUN 22 22 26* 30*  CREATININE 2.85* 2.90* 3.28* 4.45*  CALCIUM 9.3  --  9.1 8.6  MG  --   --  2.5  --   PHOS  --   --  4.2 5.2*   Liver Function Tests:  Recent Labs Lab 12/06/13 1617 12/07/13 0604 12/10/13 0719  AST 29 25  --   ALT 23 19  --   ALKPHOS 155* 145*  --   BILITOT 0.5 0.5  --   PROT 7.6 7.0  --   ALBUMIN 2.2* 2.0* 2.0*   No results for input(s): LIPASE, AMYLASE in the last 168 hours. No results for input(s): AMMONIA in the last 168 hours. CBC:  Recent Labs Lab 12/06/13 1617 12/06/13 1624 12/07/13 0604 12/10/13 0719  WBC 13.5*  --  10.3 10.6*  HGB 10.5* 12.6 9.7* 9.3*  HCT 33.0* 37.0 30.2* 29.2*  MCV 97.6  --  94.4 94.2  PLT 496*  --  487* 460*   Cardiac Enzymes:  Recent Labs Lab 12/06/13 1713  TROPONINI <0.30   BNP (last 3 results) No results for input(s): PROBNP in the last 8760 hours. CBG:  Recent Labs Lab 12/10/13 1703 12/10/13 2123 12/11/13 0757 12/11/13 1223 12/11/13 1654  GLUCAP 257* 288* 187* 227* 201*    Recent Results (from the past 240 hour(s))  MRSA PCR Screening     Status: None   Collection Time: 12/07/13  1:03 AM  Result Value Ref Range Status   MRSA by PCR NEGATIVE NEGATIVE Final    Comment:        The GeneXpert MRSA Assay (FDA approved for NASAL specimens only), is one component of a comprehensive MRSA colonization surveillance program. It is not intended to diagnose MRSA infection nor to guide or monitor treatment for MRSA infections.  Culture, blood (routine x 2)     Status: None (Preliminary result)   Collection Time: 12/07/13 10:52 AM  Result Value Ref Range Status   Specimen Description BLOOD RIGHT HAND  Final   Special Requests BOTTLES DRAWN AEROBIC ONLY 1CC  Final   Culture  Setup Time   Final    12/07/2013 20:10 Performed at [facility]    Culture   Final           BLOOD CULTURE  RECEIVED NO GROWTH TO DATE CULTURE WILL BE HELD FOR 5 DAYS BEFORE ISSUING A FINAL NEGATIVE REPORT Performed at [facility]    Report Status PENDING  Incomplete  Culture, blood (routine x 2)     Status: None (Preliminary result)   Collection Time: 12/07/13 11:00 AM  Result Value Ref Range Status   Specimen Description BLOOD RIGHT HAND  Final   Special Requests BOTTLES DRAWN AEROBIC ONLY 5CC  Final   Culture  Setup Time   Final    12/07/2013 20:10 Performed at [facility]    Culture   Final           BLOOD CULTURE RECEIVED NO GROWTH TO DATE CULTURE WILL BE HELD FOR 5 DAYS BEFORE ISSUING A FINAL NEGATIVE REPORT Performed at [facility]    Report Status  PENDING  Incomplete  Fungus Culture with Smear     Status: None (Preliminary result)   Collection Time: 12/09/13  1:28 PM  Result Value Ref Range Status   Specimen Description FLUID RIGHT PLEURAL  Final   Special Requests NONE  Final   Fungal Smear   Final    NO YEAST OR FUNGAL ELEMENTS SEEN Performed at [facility]    Culture   Final    CULTURE IN PROGRESS FOR FOUR WEEKS Performed at [facility]    Report Status PENDING  Incomplete  Body fluid culture     Status: None (Preliminary result)   Collection Time: 12/09/13  1:28 PM  Result Value Ref Range Status   Specimen Description FLUID RIGHT PLEURAL  Final   Special Requests NONE  Final   Gram Stain   Final    RARE WBC PRESENT, PREDOMINANTLY MONONUCLEAR NO ORGANISMS SEEN Performed at [facility]    Culture   Final    NO GROWTH 2 DAYS Performed at [facility]    Report Status PENDING  Incomplete  AFB culture with smear     Status: None (Preliminary result)   Collection Time: 12/09/13  1:28 PM  Result Value Ref Range Status   Specimen Description FLUID RIGHT PLEURAL  Final   Special Requests NONE  Final   Acid Fast Smear   Final    NO ACID FAST BACILLI SEEN Performed at [facility]    Culture    Final    CULTURE WILL BE EXAMINED FOR 6 WEEKS BEFORE ISSUING A FINAL REPORT Performed at [facility]    Report Status PENDING  Incomplete     Studies: Dg Chest Port 1 View  12/10/2013   CLINICAL DATA:  Shortness of breath and pleural effusion.  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 Cardiac silhouette remains mildly enlarged. Small right pleural effusion is stable to minimally increased in size. There is mild pulmonary vascular congestion with right greater than left lung airspace opacities which have overall mildly increased from the prior study. There is new partial silhouetting of the left hemidiaphragm. Trace left pleural effusion is questioned. No pneumothorax is identified.  IMPRESSION: 1. Stable to minimally increased size of small right pleural effusion. 2. Mildly increased right greater than left lung airspace disease, which may reflect asymmetric edema (including re-expansion pulmonary edema on the right related to recent thoracentesis) although infection is not excluded.   Electronically Signed   By: Logan Bores   On: 12/10/2013 08:19    Scheduled Meds: . amLODipine  5 mg Oral Daily  . aspirin EC  81 mg Oral Daily  . atorvastatin  40 mg Oral QHS  . bisacodyl  10 mg Rectal Daily  . collagenase   Topical Daily  . darbepoetin (ARANESP) injection - DIALYSIS  50 mcg Intravenous Q Tue-HD  . docusate sodium  100 mg Oral BID  . DULoxetine  30 mg Oral Daily  . feeding supplement (NEPRO CARB STEADY)  237 mL Oral Q1500  . feeding supplement (RESOURCE BREEZE)  1 Container Oral BID BM  . fentaNYL  12.5 mcg Transdermal Q72H  . gabapentin  100 mg Oral Once per day on Mon Wed Fri  . insulin aspart  0-9 Units Subcutaneous TID WC  . lanthanum  1,000 mg Oral TID WC  . methimazole  20 mg Oral TID  . metoprolol tartrate  25 mg Oral BID  . multivitamin  1 tablet Oral QHS  . OxyCODONE  10 mg Oral Q12H  . pantoprazole   40 mg Oral Daily  . polyethylene glycol  17 g Oral Daily  . ranolazine  500 mg Oral BID  . sodium chloride  3 mL Intravenous Q12H  . topiramate  25 mg Oral Daily  . 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 If 7PM-7AM, please contact night-coverage at [email or web address], password [facility] 12/11/2013, 5:33 PM  LOS: 5 days

## 2013-12-11 NOTE — Progress Notes (Signed)
Chaplain responded to page that pt desired to speak with a Chaplain. Ms. Holly Ellison spoke about being in a lot of pain, both emotional and physical. She mentioned that she refused dialysis because it caused her so much pain. As we explored her family situation and story, she mentioned repeatedly that she "wanted to go home but has no home to go to".   She says is not homeless but lives in a nursing home. Her husband of 16 years also lives in a nursing home. She expressed that she has 5 children and multiple grands and great grands. Her eldest son looks after her but she is feeling neglected by her only daughter  who hasn't been to see her citing that it is because she works and has a newborn son in addition to other issues.  Pt had fits and flurries of tears expressing her anger as she feels like she's being lied to about her medical condition. She expressed that they are "experimenting on her, knowing she won't get better and refuse to tell her". She also emphatically expressed feeling ignored citing she understood their are more pts than herself but still feels ignored. She felt like "I don't want to be here, I don't want to live like this". She also mentioned not wanting her children to see her as a bad parent. She claimed that "she lived for them, it's time for them to live for her."   We further explored her feelings alongside theological and ultimately existential concerns. During the conclusion of the visit, she finally smiled. She said she would "keep that smile". She says she is scheduled for dialysis tomorrow and may actually consent, she simply stated several times with a hopeful smile on her face "We'll see". We spent well over 70 min. together. She was very grateful for the visit and expressive of it. She requested that I come back to see her.  I'll follow-up with her tomorrow.   Delford Field, Chaplain 12/11/2013

## 2013-12-11 NOTE — Progress Notes (Signed)
Reviewed with Dr Tommy Medal, ID. Patient requires Airborne precautions.  Notified Ebony Hail, RN 6E.

## 2013-12-11 NOTE — Progress Notes (Signed)
12/11/2013 8:56 AM  This is a late entry.  Noted that a quantiferon gold assay was ordered for this patient by Dr. Joelyn Oms.  After speaking with Dr. Joelyn Oms regarding the need for airborne precautions, he informed me that it was a very low likelihood of TB, however, they have to rule it out.  I asked him if the patient needed to be on airborne precautions, and he stated he was not concerned with a respiratory TB, but checking to see if the mass was of that origin.  I called Infection Prevention to clarify and was told that unless we were concerned with a respiratory TB, then airborne precautions were not needed at this time.  At this time, patient is NOT on airborne precautions. Holly Ellison

## 2013-12-11 NOTE — Progress Notes (Addendum)
Patient is alert and oriented. Answered all questions appropriately. She is refusing care  stating that "I just wanted to be comfortable". MD at bedside.   Ave Filter, RN

## 2013-12-11 NOTE — Progress Notes (Signed)
Reassess patient r/t her concerns of "I need to be on suicidal precaution". I sit and talked with patient and she verbalized that she doesn't have any thoughts of hurting her self at this time but she does sometimes. She stated " I don't want to kill myself right now but I do want to talk to someone". I offered chaplan service and patient agreed. Spiritual service consulted and Chaplan is on his way. Safety sitter placed with patient r/t her earlier concerns of hurting herself. Will continue to monitor.  Ave Filter, RN

## 2013-12-11 NOTE — Progress Notes (Signed)
Subjective:   Having issues with memory, States she does not want to go to HD tomorrow.   Objective Filed Vitals:   12/10/13 1045 12/10/13 1120 12/10/13 2112 12/11/13 0500  BP: 119/56 109/51 115/55 118/60  Pulse: 99 99 93 99  Temp: 98.1 F (36.7 C) 97.8 F (36.6 C) 98.6 F (37 C) 98.4 F (36.9 C)  TempSrc:  Oral Oral Oral  Resp: 14 16 16 18   Height:      Weight: 68.1 kg (150 lb 2.1 oz)  68.099 kg (150 lb 2.1 oz)   SpO2: 100% 97% 100% 98%   Physical Exam General: Oriented to person and place. No acute distress. Repeats same questions. Heart: RRR Lungs: CTA, unlabored Abdomen: ost, nontender +BS  Extremities: no edema. L foot in boot Dialysis Access:  L AVF +b/t   HD: TTS [facility] 3h 39min 67.5kg 2/2.25 [place] LUA AVF Heparin 4500 Aranesp 50 ug q thurs, no other meds  Assessment/Plan: 1. 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 Blood cultures no growth. Head CT no acute abnormalitity. Not a surgical candidate. Palliative care following. Pain improved 2. Large Right Pleural Effusion-  Pulm following.s/p R thoracentesis 11/2 with 1.2 L removed- pathology and cultures pending 2. ESRD - TTS [place]. Next HD tomorrow.K+5 3. Anemia - hgb 9.3- cont aranesp 50 q week. No Fe.  4. Secondary hyperparathyroidism - phos 5.2- no binders, poor appetite now, watch Phos. Ca+ 8.6 5. HTN/volume - 161/79 cont amlodipine and metop, challenge EDW 6. Nutrition - Supplements. Multivit. Renal diet 7. dispo- Palliative care following. Verbalizes she does not like HD and cannot take anymore pain. Discussed need to talk with her son again and think about what she wants to do in terms of continuing HD and aggressive dx and tx in hospital  Shelle Iron, NP [place] [phone] 12/11/2013,9:27 AM  LOS: 5 days   Pt seen, examined, agree w assess/plan as above with additions as indicated. Pt stating  that she "wants to die", is "ready to go", doesn't want [place] to do anything more to her.  Wants pain medication. She said she does not want [place] to talk to anyone else to make this decision, including her family, she says "I'm not stupid".  Have d/w primary MD and he is going to address again with patient. She is asking , pleading for pain medication also.    [name] MD pager [phone]    cell [phone] 12/11/2013, 11:04 AM     Additional Objective Labs: Basic Metabolic Panel:  Recent Labs Lab 12/06/13 1617 12/06/13 1624 12/07/13 0604 12/10/13 0719  NA 139 136* 138 131*  K 3.7 3.4* 3.7 5.0  CL 94* 93* 95* 90*  CO2 32  --  28 27  GLUCOSE 77 78 62* 156*  BUN 22 22 26* 30*  CREATININE 2.85* 2.90* 3.28* 4.45*  CALCIUM 9.3  --  9.1 8.6  PHOS  --   --  4.2 5.2*   Liver Function Tests:  Recent Labs Lab 12/06/13 1617 12/07/13 0604 12/10/13 0719  AST 29 25  --   ALT 23 19  --   ALKPHOS 155* 145*  --   BILITOT 0.5 0.5  --   PROT 7.6 7.0  --   ALBUMIN 2.2* 2.0* 2.0*   No results for input(s): LIPASE, AMYLASE in the last 168 hours. CBC:  Recent Labs Lab 12/06/13 1617 12/06/13 1624 12/07/13 0604 12/10/13  0719  WBC 13.5*  --  10.3 10.6*  HGB 10.5* 12.6 9.7* 9.3*  HCT 33.0* 37.0 30.2* 29.2*  MCV 97.6  --  94.4 94.2  PLT 496*  --  487* 460*   Blood Culture    Component Value Date/Time   SDES FLUID RIGHT PLEURAL 12/09/2013 1328   SDES FLUID RIGHT PLEURAL 12/09/2013 1328   SDES FLUID RIGHT PLEURAL 12/09/2013 1328   SPECREQUEST NONE 12/09/2013 1328   SPECREQUEST NONE 12/09/2013 1328   SPECREQUEST NONE 12/09/2013 1328   CULT  12/09/2013 1328    CULTURE IN PROGRESS FOR FOUR WEEKS Performed at [facility]  12/09/2013 1328    NO GROWTH 1 DAY Performed at [facility]  12/09/2013 1328    CULTURE WILL BE EXAMINED FOR 6 WEEKS BEFORE ISSUING A FINAL REPORT Performed at [facility] PENDING 12/09/2013 1328    REPTSTATUS PENDING 12/09/2013 1328   REPTSTATUS PENDING 12/09/2013 1328    Cardiac Enzymes:  Recent Labs Lab 12/06/13 1713  TROPONINI <0.30   CBG:  Recent Labs Lab 12/09/13 2013 12/10/13 1118 12/10/13 1703 12/10/13 2123 12/11/13 0757  GLUCAP 141* 119* 257* 288* 187*   Iron Studies: No results for input(s): IRON, TIBC, TRANSFERRIN, FERRITIN in the last 72 hours. @lablastinr3 @ Studies/Results: Dg Chest 1 View  12/09/2013   CLINICAL DATA:  Post thoracentesis.  Back pain  EXAM: CHEST - 1 VIEW  COMPARISON:  12/06/2013  FINDINGS: Decreasing right pleural effusion following right thoracentesis. Small right pleural effusion persists. Airspace disease throughout the right lung which could represent edema or infection. Increasing airspace disease on the left. Heart is borderline in size. No visible left effusion. No pneumothorax following thoracentesis.  IMPRESSION: Decreasing right effusion following thoracentesis.  No pneumothorax.  Bilateral airspace disease, right greater than left. This is increased on the left since prior study. This could represent asymmetric edema or infection.   Electronically Signed   By: Rolm Baptise M.D.   On: 12/09/2013 13:51   Dg Chest Port 1 View  12/10/2013   CLINICAL DATA:  Shortness of breath and pleural effusion.  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 Cardiac silhouette remains mildly enlarged. Small right pleural effusion is stable to minimally increased in size. There is mild pulmonary vascular congestion with right greater than left lung airspace opacities which have overall mildly increased from the prior study. There is new partial silhouetting of the left hemidiaphragm. Trace left pleural effusion is questioned. No pneumothorax is identified.  IMPRESSION: 1. Stable to minimally increased size of small right pleural effusion. 2. Mildly increased right greater than left lung airspace  disease, which may reflect asymmetric edema (including re-expansion pulmonary edema on the right related to recent thoracentesis) although infection is not excluded.   Electronically Signed   By: Logan Bores   On: 12/10/2013 08:19   US Thoracentesis Asp Pleural Space W/img Guide  12/09/2013   CLINICAL DATA:  Right pleural effusion  EXAM: ULTRASOUND GUIDED right THORACENTESIS  COMPARISON:  None.  PROCEDURE: An ultrasound guided thoracentesis was thoroughly discussed with the patient and questions answered. The benefits, risks, alternatives and complications were also discussed. The patient understands and wishes to proceed with the procedure. Written consent was obtained.  Ultrasound was performed to localize and mark an adequate pocket of fluid in the right chest. The area was then prepped  and draped in the normal sterile fashion. 1% Lidocaine was used for local anesthesia. Under ultrasound guidance a 19 gauge Yueh catheter was introduced. Thoracentesis was performed. The catheter was removed and a dressing applied.  Complications:  None  FINDINGS: A total of approximately 1.2 L of dark yellow fluid was removed. A fluid sample wassent for laboratory analysis.  IMPRESSION: Successful ultrasound guided right thoracentesis yielding 1.2 L of pleural fluid.  Read by:  Lavonia Drafts [facility]   Electronically Signed   By: Daryll Brod M.D.   On: 12/09/2013 14:32   Medications:   . amLODipine  5 mg Oral Daily  . aspirin EC  81 mg Oral Daily  . atorvastatin  40 mg Oral QHS  . bisacodyl  10 mg Rectal Daily  . collagenase   Topical Daily  . darbepoetin (ARANESP) injection - DIALYSIS  50 mcg Intravenous Q Tue-HD  . docusate sodium  100 mg Oral BID  . DULoxetine  30 mg Oral Daily  . feeding supplement (NEPRO CARB STEADY)  237 mL Oral Q1500  . feeding supplement (RESOURCE BREEZE)  1 Container Oral BID BM  . fentaNYL  12.5 mcg Transdermal Q72H  . gabapentin  100 mg Oral Once per day on Mon Wed Fri  . insulin  aspart  0-9 Units Subcutaneous TID WC  . lanthanum  1,000 mg Oral TID WC  . methimazole  20 mg Oral TID  . metoprolol tartrate  25 mg Oral BID  . multivitamin  1 tablet Oral QHS  . OxyCODONE  10 mg Oral Q12H  . pantoprazole  40 mg Oral Daily  . polyethylene glycol  17 g Oral Daily  . ranolazine  500 mg Oral BID  . sodium chloride  3 mL Intravenous Q12H  . topiramate  25 mg Oral Daily  . traZODone  50 mg Oral QHS

## 2013-12-11 NOTE — Plan of Care (Signed)
Problem: Phase I Progression Outcomes Goal: Pain controlled with appropriate interventions Outcome: Completed/Met Date Met:  12/11/13 Goal: Initial discharge plan identified Outcome: Completed/Met Date Met:  12/11/13 Goal: Dyspnea controlled at rest Outcome: Completed/Met Date Met:  12/11/13 Goal: Skin without signs of pressure Outcome: Not Applicable Date Met:  43/92/65 Goal: Social Worker consult (if SNF or HD pt.) Outcome: Completed/Met Date Met:  12/11/13 Goal: Hemodynamically stable Outcome: Completed/Met Date Met:  12/11/13

## 2013-12-11 NOTE — Progress Notes (Signed)
Patient refused TELE and VS. MD is aware of situation. TELE d/c'ed per dr. Darrick Meigs. Son is on the phone with MD. Will continue to monitor.  Ave Filter, RN

## 2013-12-11 NOTE — Progress Notes (Signed)
Son at bedside wanting an update on patient with questions why patient is on airborne contact and why she has a Actuary. I offer the patient the privilege of explaining these concerns to her son but patient stated "I don't know". I revisited our conversation earlier with her. I also asked him if he wanted to speak with the charge nurse. Patient appears agitated and stated that "y'all explained things differently".  Son wanted sitter to be discontinued but I explained to him that I cannot do that per her earlier wishes. Patient then stated "I didn't say all of that". MD paged and now at bedside with son.    Ave Filter, RN

## 2013-12-11 NOTE — Progress Notes (Signed)
Department director in to speak with patient. Patient stated that she "did not want to go to dialysis" patient reports "it hurts and I don't like it". Patient asked what she needed to do if she didn't want to go to HD. Encouraged patient to share this information with the MD.  Spoke with patient regarding the desire to kill/hurt herself. Patient states she has had those thoughts. Patient requested to speak with the chaplain. Chaplain paged. Karra Pink, [place]

## 2013-12-11 NOTE — Progress Notes (Signed)
12/11/2013 1:04 PM  Pt just called up to the front desk wanting to know why she did not have a sitter in her room since she thought she was supposed to be on suicide precautions.  This is the first any of our staff have heard of the need for suicide precautions on this patient.  I went into the room to question the patient as to why she felt she needed a sitter.  She stated that she needed a suicide sitter because when she left the hospital last time she was supposed to be on suicide precautions.  I asked her when she last had a suicidal thought, and she replied two days ago, however, she had had some thoughts the past twenty-four hours.  I asked her if she had a plan to hurt herself, and she said no, however, she was trying to think of something.  Dr. Darrick Meigs notified of the situation.  Patient placed on suicide precautions, sitter placed at bedside and room cleared of potentially harmful objects.  Pt requests the chaplain, which we have paged.  Will continue to monitor. Princella Pellegrini

## 2013-12-11 NOTE — Progress Notes (Signed)
PULMONARY / CRITICAL CARE MEDICINE   Name: Holly Ellison MRN: [id number] DOB: [birth year]    ADMISSION DATE:  12/06/2013 CONSULTATION DATE:  12/08/13  REFERRING MD :  [place] [facility]   CHIEF COMPLAINT:  sob  INITIAL PRESENTATION:  73 yo female former smoker developed back pain in June 2015 >> found to have T10/T11 lesion >> had laminectomy 07/16/13 >> pathology negative.  Developed progressive paraplegia and dyspnea.  Found to have lesions in T9/T10/T11 with cord compression and large Rt pleural effusion.  STUDIES:  CT chest 10/31 >> destructive process T9/T10/T11, cord compression, Large Rt pleural effusion Quantiferon gold 11/01 >>   SIGNIFICANT EVENTS: 10/30 Admit 11/02 R thoracentesis under u/s>>>1.2L dark yellow - exudative   SUBJECTIVE:  Feels a little better.  Very upset that her nails are dirty.  Dislikes her breakfast.   VITAL SIGNS: Temp:  [97.8 F (36.6 C)-98.6 F (37 C)] 98.4 F (36.9 C) (11/04 0500) Pulse Rate:  [93-99] 99 (11/04 0500) Resp:  [14-18] 18 (11/04 0500) BP: (95-119)/(43-60) 118/60 mmHg (11/04 0500) SpO2:  [97 %-100 %] 98 % (11/04 0500) Weight:  [150 lb 2.1 oz (68.099 kg)-150 lb 2.1 oz (68.1 kg)] 150 lb 2.1 oz (68.099 kg) (11/03 2112) INTAKE / OUTPUT:  Intake/Output Summary (Last 24 hours) at 12/11/13 1010 Last data filed at 12/11/13 0500  Gross per 24 hour  Intake     60 ml  Output   1331 ml  Net  -1271 ml    PHYSICAL EXAMINATION: General: Chronically ill appearing, resting in bed, in NAD. Neuro: awake, alert, Paraplegic HEENT: [place]/AT. PERRL, sclerae anicteric. Cardiovascular: RRR, no M/R/G.  Lungs: Resps even non labored on RA, diminished R  No W/R/R. Abdomen: BS x 4, soft, NT/ND.  Musculoskeletal: No gross deformities, no edema. Bilateral ankle boots in place. Skin: Intact, warm, no rashes.   LABS:  CBC  Recent Labs Lab 12/06/13 1617 12/06/13 1624 12/07/13 0604 12/10/13 0719  WBC 13.5*  --  10.3 10.6*  HGB 10.5* 12.6 9.7*  9.3*  HCT 33.0* 37.0 30.2* 29.2*  PLT 496*  --  487* 460*   Coag's  Recent Labs Lab 12/06/13 1617  INR 1.23   BMET  Recent Labs Lab 12/06/13 1617 12/06/13 1624 12/07/13 0604 12/10/13 0719  NA 139 136* 138 131*  K 3.7 3.4* 3.7 5.0  CL 94* 93* 95* 90*  CO2 32  --  28 27  BUN 22 22 26* 30*  CREATININE 2.85* 2.90* 3.28* 4.45*  GLUCOSE 77 78 62* 156*   Electrolytes  Recent Labs Lab 12/06/13 1617 12/07/13 0604 12/10/13 0719  CALCIUM 9.3 9.1 8.6  MG  --  2.5  --   PHOS  --  4.2 5.2*   Sepsis Markers  Recent Labs Lab 12/06/13 1625  LATICACIDVEN 2.02   Liver Enzymes  Recent Labs Lab 12/06/13 1617 12/07/13 0604 12/10/13 0719  AST 29 25  --   ALT 23 19  --   ALKPHOS 155* 145*  --   BILITOT 0.5 0.5  --   ALBUMIN 2.2* 2.0* 2.0*   Cardiac Enzymes  Recent Labs Lab 12/06/13 1713  TROPONINI <0.30   Glucose  Recent Labs Lab 12/09/13 1705 12/09/13 2013 12/10/13 1118 12/10/13 1703 12/10/13 2123 12/11/13 0757  GLUCAP 111* 141* 119* 257* 288* 187*     ASSESSMENT / PLAN:  Paraplegia, dyspnea in setting of Large Rt pleural effusion and destructive lesions in T9/T10/T11. Pleural fluid:  wbc=38, LDH=90 glucose=146, protein=4.7 - exudative by lights. WBC  trending down.  ?malignancy/myeloma v inflammatory v TB (doubt) v amyloid?? With elevated kappa and lamda light chains.   Plan: AFB, culture, cytology still pending  F/u CXR Neurosurgery following >> not surgical candidate at this time Palliative care following - ??how aggressive pt/family want to be with overall decline/paraplegia   Hx of ESRD. Plan: Per renal  Goals of care >> per primary team and palliative care team  Will discuss with Dr. Halford Chessman.   Nickolas Madrid, NP 12/11/2013  10:10 AM Pager: [phone] or [phone]  Reviewed above, examined.  She is very angry and frustrated.  She feels her quality of life will be miserable.  She is not sure she wants to continue with all these  tests.  She has not discussed these feelings with her family yet.  Will defer to primary team and palliative care to further arrange goals of care meeting with family.  If aggressive evaluation is to continue, then she should have repeat thoracentesis by IR >> would send for adenosine deaminase, culture, AFB, fungal culture, and cytology.  If this is non diagnostic then would consult thoracic surgery to assess for pleuroscopy with pleural biopsy.  PCCM will sign off.  Please call if additional help is needed.  D/w Dr. Darrick Meigs.  Chesley Mires, MD [facility] Pulmonary/Critical Care 12/11/2013, 11:41 AM Pager:  [phone] After 3pm call: [phone]

## 2013-12-12 DIAGNOSIS — J42 Unspecified chronic bronchitis: Secondary | ICD-10-CM

## 2013-12-12 DIAGNOSIS — F39 Unspecified mood [affective] disorder: Secondary | ICD-10-CM

## 2013-12-12 DIAGNOSIS — E119 Type 2 diabetes mellitus without complications: Secondary | ICD-10-CM

## 2013-12-12 LAB — GLUCOSE, CAPILLARY
GLUCOSE-CAPILLARY: 191 mg/dL — AB (ref 70–99)
GLUCOSE-CAPILLARY: 182 mg/dL — AB (ref 70–99)
GLUCOSE-CAPILLARY: 307 mg/dL — AB (ref 70–99)
Glucose-Capillary: 138 mg/dL — ABNORMAL HIGH (ref 70–99)

## 2013-12-12 LAB — COMPREHENSIVE METABOLIC PANEL
ALT: 28 U/L (ref 0–35)
ANION GAP: 17 — AB (ref 5–15)
AST: 29 U/L (ref 0–37)
Albumin: 2.1 g/dL — ABNORMAL LOW (ref 3.5–5.2)
Alkaline Phosphatase: 160 U/L — ABNORMAL HIGH (ref 39–117)
BUN: 25 mg/dL — ABNORMAL HIGH (ref 6–23)
CO2: 25 meq/L (ref 19–32)
Calcium: 9 mg/dL (ref 8.4–10.5)
Chloride: 90 mEq/L — ABNORMAL LOW (ref 96–112)
Creatinine, Ser: 4.06 mg/dL — ABNORMAL HIGH (ref 0.50–1.10)
GFR calc Af Amer: 12 mL/min — ABNORMAL LOW (ref >90)
GFR, EST NON AFRICAN AMERICAN: 10 mL/min — AB (ref >90)
Glucose, Bld: 181 mg/dL — ABNORMAL HIGH (ref 70–99)
Potassium: 4.6 mEq/L (ref 3.7–5.3)
Sodium: 132 mEq/L — ABNORMAL LOW (ref 137–147)
Total Bilirubin: 0.4 mg/dL (ref 0.3–1.2)
Total Protein: 7.2 g/dL (ref 6.0–8.3)

## 2013-12-12 LAB — CBC
HCT: 29.7 % — ABNORMAL LOW (ref 36.0–46.0)
Hemoglobin: 9.3 g/dL — ABNORMAL LOW (ref 12.0–15.0)
MCH: 29.7 pg (ref 26.0–34.0)
MCHC: 31.3 g/dL (ref 30.0–36.0)
MCV: 94.9 fL (ref 78.0–100.0)
PLATELETS: 459 10*3/uL — AB (ref 150–400)
RBC: 3.13 MIL/uL — ABNORMAL LOW (ref 3.87–5.11)
RDW: 16.6 % — AB (ref 11.5–15.5)
WBC: 11.5 10*3/uL — ABNORMAL HIGH (ref 4.0–10.5)

## 2013-12-12 MED ORDER — ONDANSETRON HCL 4 MG PO TABS
4.0000 mg | ORAL_TABLET | Freq: Three times a day (TID) | ORAL | Status: DC | PRN
  Administered 2013-12-15: 4 mg via ORAL
  Filled 2013-12-12: qty 1

## 2013-12-12 MED ORDER — ALBUMIN HUMAN 25 % IV SOLN
25.0000 g | Freq: Once | INTRAVENOUS | Status: AC
  Administered 2013-12-12: 25 g via INTRAVENOUS

## 2013-12-12 MED ORDER — LIDOCAINE HCL (PF) 1 % IJ SOLN: 5.0000 mL | INTRAMUSCULAR | Status: DC | PRN

## 2013-12-12 MED ORDER — HYDROMORPHONE HCL 2 MG PO TABS
2.0000 mg | ORAL_TABLET | ORAL | Status: DC | PRN
  Administered 2013-12-12 – 2013-12-19 (×22): 2 mg via ORAL
  Filled 2013-12-12 (×25): qty 1

## 2013-12-12 MED ORDER — SENNOSIDES-DOCUSATE SODIUM 8.6-50 MG PO TABS
2.0000 | ORAL_TABLET | Freq: Every day | ORAL | Status: DC
  Administered 2013-12-12 – 2013-12-18 (×7): 2 via ORAL
  Filled 2013-12-12 (×10): qty 2

## 2013-12-12 MED ORDER — LIDOCAINE-PRILOCAINE 2.5-2.5 % EX CREA: 1.0000 "application " | TOPICAL_CREAM | CUTANEOUS | Status: DC | PRN

## 2013-12-12 MED ORDER — ALTEPLASE 2 MG IJ SOLR
2.0000 mg | Freq: Once | INTRAMUSCULAR | Status: AC | PRN
  Filled 2013-12-12: qty 2

## 2013-12-12 MED ORDER — NEPRO/CARBSTEADY PO LIQD: 237.0000 mL | ORAL | Status: DC | PRN

## 2013-12-12 MED ORDER — SODIUM CHLORIDE 0.9 % IV SOLN: 100.0000 mL | INTRAVENOUS | Status: DC | PRN

## 2013-12-12 MED ORDER — FENTANYL 25 MCG/HR TD PT72
25.0000 ug | MEDICATED_PATCH | TRANSDERMAL | Status: DC
  Administered 2013-12-14 – 2013-12-17 (×2): 25 ug via TRANSDERMAL
  Filled 2013-12-12 (×2): qty 1

## 2013-12-12 MED ORDER — TRAZODONE 25 MG HALF TABLET
25.0000 mg | ORAL_TABLET | Freq: Every day | ORAL | Status: DC
  Administered 2013-12-12 – 2013-12-18 (×7): 25 mg via ORAL
  Filled 2013-12-12 (×8): qty 1

## 2013-12-12 MED ORDER — HEPARIN SODIUM (PORCINE) 1000 UNIT/ML DIALYSIS
1000.0000 [IU] | INTRAMUSCULAR | Status: DC | PRN
  Filled 2013-12-12: qty 1

## 2013-12-12 MED ORDER — ALBUMIN HUMAN 25 % IV SOLN
INTRAVENOUS | Status: AC
  Filled 2013-12-12: qty 100

## 2013-12-12 MED ORDER — PENTAFLUOROPROP-TETRAFLUOROETH EX AERO: 1.0000 "application " | INHALATION_SPRAY | CUTANEOUS | Status: DC | PRN

## 2013-12-12 MED ORDER — DEXAMETHASONE SODIUM PHOSPHATE 10 MG/ML IJ SOLN
10.0000 mg | INTRAMUSCULAR | Status: DC
  Administered 2013-12-12 – 2013-12-13 (×2): 10 mg via INTRAVENOUS
  Filled 2013-12-12 (×3): qty 1

## 2013-12-12 NOTE — Progress Notes (Signed)
Palliative Medicine Team Progress Note  Holly Ellison continues to have poorly controlled pain- secondary to a severe pathologic fracture at T9-T11-process unknown as decompressive biopsy was negative. She also has a large lung effusion of unknown etiolgy. She presented with cord compression symptoms. She has had issues with confusion and polypharmacy with pain and adjuvant pain medications including psychiatric medications and anti-epileptics. She is confused but has also been refusing HD, her son has to convince her to received the HD. She says "it is too painful". I met with Dwayne to see if I could help clarify goals. He postion is that his mother "wants to be free of her pain not hemodialysis".   For now I recommend a more aggressive approach with her pain management and Dwayne agrees to a new regimen- he doesn't think the oxycodone is effective.  I reviewed her scans there is significant cord edema - I would recommend starting  Decadron for anti-inflammatory benefits-she is deemed not to be a surgical candidate- but may get pain relief from the addition of a steroid.  I increased her Duragesic to 25mcg and started her on PRN hydromorphone tablets for BTP-she has a listed dilaudid allergy but teh reaction is itching which really isnt an allergy but a side effect of histamine release from opiates. Can use Zofran to mitigate the itching from serotonin.  Recommend avoiding novel pain drugs and antipsychotics until we have maximized opiates and worked with a simplified regimen. Ok to leave Cymbalta- I stopped topamax and others.  Continue Gabapentin-titrate this up cautiously with renal failure.  Benzos have mostly already been stopped.  Ok to leave PRN baclofen but will dose reduce.  I would like to have evaluated by Interventional Pain Management (Dr. Harkins) to see if we can get her some relief and mental clarity if that is possible.   Would consider additional malignancy work-up- this looks  highly suspicious for cancer- may need to obtain additional imaging-ie. MRI, CT C/A/P/B.  Will follow closely.   , DO Palliative Medicine [phone] 

## 2013-12-12 NOTE — Progress Notes (Signed)
[facility] PROGRESS NOTE  Holly Ellison PNT:[id number] DOB: [birth year] DOA: 12/06/2013 PCP: [name], MD  Assessment/Plan: 1. 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 Palliative care has seen the patient and started on Decadron to reduce the cord edema as well as better pain control. 2. Chronic back pain- patient has chronic back pain from the progressive pathologic fracture of the T10-11 vertebrae, continue baclofen 10 mg every 12 hours when necessary,fentanyl patch has been increased to 25 g every 72 hours, Neurontin 100 mg 3 times weekly. Started back on Cymbalta, Topamax discontinued. 3. Pleural effusion- CT chest showed large right pleural effusion, thoracentesis was performed and cytology reveals mixed inflammatory cells, reactive appearing mesothelial cells.pulmonary is following, and recommended repeat thoracentesis with a repeat cytology. We'll also obtain CT abdomen pelvis to rule out malignancy. 4. Suicidal ideation- patient complained of suicidal ideation today, one is to one sitter, and psych consult obtained.Will follow psych recommendations. 5. End-stage renal disease- patient is currently on hemodialysis, and is refusing to undergo further hemodialysis. Discussed with Dr. Jonnie Finner,  .[name] is at this time he would not who says that he would not put this patient for hemodialysis at this time. 6. Hyperthyroidism- Continue Tapazole, the dose has now increased to 20 mg po TID 7. Diabetes Mellitus- sliding-scale insulin    Code Status: Full code Family Communication: discussed with patient's son, Dr. [name]. As per palliative care patient does not have capacity to make decisions, so we'll have to follow patient's son's wishes for further  procedures. Disposition Plan:  SNF   Consultants:  Nephrology  Neurology   Palliative care  Procedures:  Thoracentesis  Antibiotics:  None  HPI/Subjective: 73 year old female with past medical history of end-stage renal disease and intermittent compliance issues with dialysis who earlier this year underwent a decompressive thoracic laminectomy for unclear spinal process which was causing cord compression and edema presented to the emergency room on 10/30 with complaints of worsening shortness of breath times several days plus progression and loss of her right sided extremities. MRI thoracic spine demonstrated further spinal cord compression of vertebral destruction and seen by neurosurgery who felt that she would not be an operative candidate. On chest x-ray patient also noted to have large left-sided pleural effusion not present last month on x-ray. Initially when she was admitted, patient's mentation in question. Patient admitted to the hospitalist service. Nephrology consulted Seen by pulmonary and interventional radiology and underwent thoracentesis of the right lung with 1.2 L of fluid removed. Patient has also been followed by palliative care but deciding on goals of care has been complicated due to family issues and patient's wavering mentation which may or may not be chronic.     Objective: Filed Vitals:   12/12/13 1600  BP: 144/63  Pulse: 101  Temp:   Resp:     Intake/Output Summary (Last 24 hours) at 12/12/13 1655 Last data filed at 12/12/13 1542  Gross per 24 hour  Intake    120 ml  Output   2501 ml  Net  -2381 ml   Filed Weights   12/10/13 1045 12/10/13 2112 12/11/13 2027  Weight: 68.1 kg (150 lb 2.1 oz) 68.099 kg (150 lb 2.1 oz) 69.3 kg (152 lb 12.5 oz)    Exam:  Physical Exam: Head: Normocephalic, atraumatic.  Eyes: No  signs of jaundice, EOMI Lungs: Normal respiratory effort. B/L Clear to auscultation, no crackles or wheezes.  Heart: Regular RR. S1  and S2 normal  Abdomen: BS normoactive. Soft, Nondistended, non-tender.  Extremities: No pretibial edema, no erythema   Data Reviewed: Basic Metabolic Panel:  Recent Labs Lab 12/06/13 1617 12/06/13 1624 12/07/13 0604 12/10/13 0719 12/12/13 1000  NA 139 136* 138 131* 132*  K 3.7 3.4* 3.7 5.0 4.6  CL 94* 93* 95* 90* 90*  CO2 32  --  28 27 25   GLUCOSE 77 78 62* 156* 181*  BUN 22 22 26* 30* 25*  CREATININE 2.85* 2.90* 3.28* 4.45* 4.06*  CALCIUM 9.3  --  9.1 8.6 9.0  MG  --   --  2.5  --   --   PHOS  --   --  4.2 5.2*  --    Liver Function Tests:  Recent Labs Lab 12/06/13 1617 12/07/13 0604 12/10/13 0719 12/12/13 1000  AST 29 25  --  29  ALT 23 19  --  28  ALKPHOS 155* 145*  --  160*  BILITOT 0.5 0.5  --  0.4  PROT 7.6 7.0  --  7.2  ALBUMIN 2.2* 2.0* 2.0* 2.1*   No results for input(s): LIPASE, AMYLASE in the last 168 hours. No results for input(s): AMMONIA in the last 168 hours. CBC:  Recent Labs Lab 12/06/13 1617 12/06/13 1624 12/07/13 0604 12/10/13 0719 12/12/13 1000  WBC 13.5*  --  10.3 10.6* 11.5*  HGB 10.5* 12.6 9.7* 9.3* 9.3*  HCT 33.0* 37.0 30.2* 29.2* 29.7*  MCV 97.6  --  94.4 94.2 94.9  PLT 496*  --  487* 460* 459*   Cardiac Enzymes:  Recent Labs Lab 12/06/13 1713  TROPONINI <0.30   BNP (last 3 results) No results for input(s): PROBNP in the last 8760 hours. CBG:  Recent Labs Lab 12/11/13 1223 12/11/13 1654 12/12/13 1020 12/12/13 1205 12/12/13 1620  GLUCAP 227* 201* 191* 182* 138*    Recent Results (from the past 240 hour(s))  MRSA PCR Screening     Status: None   Collection Time: 12/07/13  1:03 AM  Result Value Ref Range Status   MRSA by PCR NEGATIVE NEGATIVE Final    Comment:        The GeneXpert MRSA Assay (FDA approved for NASAL specimens only), is one component of a comprehensive MRSA colonization surveillance program. It is not intended to diagnose MRSA infection nor to guide or monitor treatment for MRSA  infections.  Culture, blood (routine x 2)     Status: None (Preliminary result)   Collection Time: 12/07/13 10:52 AM  Result Value Ref Range Status   Specimen Description BLOOD RIGHT HAND  Final   Special Requests BOTTLES DRAWN AEROBIC ONLY 1CC  Final   Culture  Setup Time   Final    12/07/2013 20:10 Performed at [facility]    Culture   Final           BLOOD CULTURE RECEIVED NO GROWTH TO DATE CULTURE WILL BE HELD FOR 5 DAYS BEFORE ISSUING A FINAL NEGATIVE REPORT Performed at [facility]    Report Status PENDING  Incomplete  Culture, blood (routine x 2)     Status: None (Preliminary result)   Collection Time: 12/07/13 11:00 AM  Result Value Ref Range Status   Specimen Description BLOOD RIGHT HAND  Final   Special Requests BOTTLES DRAWN AEROBIC ONLY 5CC  Final   Culture  Setup  Time   Final    12/07/2013 20:10 Performed at [facility]    Culture   Final           BLOOD CULTURE RECEIVED NO GROWTH TO DATE CULTURE WILL BE HELD FOR 5 DAYS BEFORE ISSUING A FINAL NEGATIVE REPORT Performed at [facility]    Report Status PENDING  Incomplete  Fungus Culture with Smear     Status: None (Preliminary result)   Collection Time: 12/09/13  1:28 PM  Result Value Ref Range Status   Specimen Description FLUID RIGHT PLEURAL  Final   Special Requests NONE  Final   Fungal Smear   Final    NO YEAST OR FUNGAL ELEMENTS SEEN Performed at [facility]    Culture   Final    CULTURE IN PROGRESS FOR FOUR WEEKS Performed at [facility]    Report Status PENDING  Incomplete  Body fluid culture     Status: None (Preliminary result)   Collection Time: 12/09/13  1:28 PM  Result Value Ref Range Status   Specimen Description FLUID RIGHT PLEURAL  Final   Special Requests NONE  Final   Gram Stain   Final    RARE WBC PRESENT, PREDOMINANTLY MONONUCLEAR NO ORGANISMS SEEN Performed at [facility]    Culture   Final    NO GROWTH 3 DAYS Performed  at [facility]    Report Status PENDING  Incomplete  AFB culture with smear     Status: None (Preliminary result)   Collection Time: 12/09/13  1:28 PM  Result Value Ref Range Status   Specimen Description FLUID RIGHT PLEURAL  Final   Special Requests NONE  Final   Acid Fast Smear   Final    NO ACID FAST BACILLI SEEN Performed at [facility]    Culture   Final    CULTURE WILL BE EXAMINED FOR 6 WEEKS BEFORE ISSUING A FINAL REPORT Performed at [facility]    Report Status PENDING  Incomplete     Studies: No results found.  Scheduled Meds: . amLODipine  5 mg Oral Daily  . aspirin EC  81 mg Oral Daily  . atorvastatin  40 mg Oral QHS  . collagenase   Topical Daily  . darbepoetin (ARANESP) injection - DIALYSIS  50 mcg Intravenous Q Tue-HD  . dexamethasone  10 mg Intravenous Q24H  . docusate sodium  100 mg Oral BID  . DULoxetine  30 mg Oral Daily  . feeding supplement (NEPRO CARB STEADY)  237 mL Oral Q1500  . feeding supplement (RESOURCE BREEZE)  1 Container Oral BID BM  . [START ON 12/14/2013] fentaNYL  25 mcg Transdermal Q72H  . gabapentin  100 mg Oral Once per day on Mon Wed Fri  . insulin aspart  0-9 Units Subcutaneous TID WC  . lanthanum  1,000 mg Oral TID WC  . methimazole  20 mg Oral TID  . metoprolol tartrate  25 mg Oral BID  . multivitamin  1 tablet Oral QHS  . pantoprazole  40 mg Oral Daily  . polyethylene glycol  17 g Oral Daily  . ranolazine  500 mg Oral BID  . senna-docusate  2 tablet Oral QHS  . sodium chloride  3 mL Intravenous Q12H  . 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 If 7PM-7AM, please contact night-coverage at [email or web address], password [facility] 12/12/2013, 4:55 PM  LOS: 6 days

## 2013-12-12 NOTE — Progress Notes (Addendum)
Subjective:   On HD  Objective Filed Vitals:   12/12/13 0500 12/12/13 1145 12/12/13 1152 12/12/13 1200  BP: 145/58 142/58 140/60 142/52  Pulse: 106 92 88 84  Temp: 100 F (37.8 C) 98.8 F (37.1 C)    TempSrc: Oral Oral    Resp: 16 18 18 16   Height:      Weight:      SpO2: 92%      Physical Exam General: Oriented to person and place. No acute distress. Repeats same questions. Heart: RRR Lungs: CTA, unlabored Abdomen: ost, nontender +BS  Extremities: no edema. L foot in boot Dialysis Access:  L AVF +b/t   HD: TTS [place] 3h 45min 67.5kg 2/2.25 [place] LUA AVF Heparin 4500 Aranesp 50 ug q thurs, no other meds  Assessment: 1. 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 Blood cultures no growth. Head CT no acute abnormalitity. Per primary MD.  2. Large Right Pleural Effusion-  Pulm following.s/p R thoracentesis 11/2 with 1.2 L removed- pathology and cultures pending 2. ESRD - TTS [place], was refusing HD but agreed to do it today 3. Anemia - hgb 9.3- cont aranesp 50 q week. No Fe.  4. Secondary hyperparathyroidism - phos 5.2- no binders, poor appetite now, watch Phos. Ca+ 8.6 5. HTN/volume - 161/79 cont amlodipine and metop, challenge EDW 6. Nutrition - Supplements. Multivit. Renal diet  Plan -  HD today, UF 2kg as Ronnie Derby MD (pgr) [phone]    ([phone] 12/12/2013, 12:29 PM         Additional Objective Labs: Basic Metabolic Panel:  Recent Labs Lab 12/07/13 0604 12/10/13 0719 12/12/13 1000  NA 138 131* 132*  K 3.7 5.0 4.6  CL 95* 90* 90*  CO2 28 27 25   GLUCOSE 62* 156* 181*  BUN 26* 30* 25*  CREATININE 3.28* 4.45* 4.06*  CALCIUM 9.1 8.6 9.0  PHOS 4.2 5.2*  --    Liver Function Tests:  Recent Labs Lab 12/06/13 1617 12/07/13 0604 12/10/13 0719 12/12/13 1000  AST 29 25  --  29  ALT 23 19  --  28  ALKPHOS 155* 145*  --  160*  BILITOT 0.5 0.5  --  0.4   PROT 7.6 7.0  --  7.2  ALBUMIN 2.2* 2.0* 2.0* 2.1*   No results for input(s): LIPASE, AMYLASE in the last 168 hours. CBC:  Recent Labs Lab 12/06/13 1617  12/07/13 0604 12/10/13 0719 12/12/13 1000  WBC 13.5*  --  10.3 10.6* 11.5*  HGB 10.5*  < > 9.7* 9.3* 9.3*  HCT 33.0*  < > 30.2* 29.2* 29.7*  MCV 97.6  --  94.4 94.2 94.9  PLT 496*  --  487* 460* 459*  < > = values in this interval not displayed. Blood Culture    Component Value Date/Time   SDES FLUID RIGHT PLEURAL 12/09/2013 1328   SDES FLUID RIGHT PLEURAL 12/09/2013 1328   SDES FLUID RIGHT PLEURAL 12/09/2013 1328   SPECREQUEST NONE 12/09/2013 1328   [place] 12/09/2013 1328   [place] 12/09/2013 1328   CULT  12/09/2013 1328    CULTURE IN PROGRESS FOR FOUR WEEKS Performed at [place]  12/09/2013 1328    NO GROWTH 3 DAYS Performed at [place]  12/09/2013 1328    CULTURE WILL BE EXAMINED FOR 6 WEEKS BEFORE ISSUING A FINAL REPORT Performed  at [place] PENDING 12/09/2013 1328   REPTSTATUS PENDING 12/09/2013 1328   REPTSTATUS PENDING 12/09/2013 1328    Cardiac Enzymes:  Recent Labs Lab 12/06/13 1713  TROPONINI <0.30   CBG:  Recent Labs Lab 12/11/13 0757 12/11/13 1223 12/11/13 1654 12/12/13 1020 12/12/13 1205  GLUCAP 187* 227* 201* 191* 182*   Iron Studies: No results for input(s): IRON, TIBC, TRANSFERRIN, FERRITIN in the last 72 hours. @lablastinr3 @ Studies/Results: No results found. Medications:   . amLODipine  5 mg Oral Daily  . aspirin EC  81 mg Oral Daily  . atorvastatin  40 mg Oral QHS  . collagenase   Topical Daily  . darbepoetin (ARANESP) injection - DIALYSIS  50 mcg Intravenous Q Tue-HD  . dexamethasone  10 mg Intravenous Q24H  . docusate sodium  100 mg Oral BID  . DULoxetine  30 mg Oral Daily  . feeding supplement (NEPRO CARB STEADY)  237 mL Oral Q1500  . feeding supplement (RESOURCE BREEZE)  1  Container Oral BID BM  . [START ON 12/14/2013] fentaNYL  25 mcg Transdermal Q72H  . gabapentin  100 mg Oral Once per day on Mon Wed Fri  . insulin aspart  0-9 Units Subcutaneous TID WC  . lanthanum  1,000 mg Oral TID WC  . methimazole  20 mg Oral TID  . metoprolol tartrate  25 mg Oral BID  . multivitamin  1 tablet Oral QHS  . pantoprazole  40 mg Oral Daily  . polyethylene glycol  17 g Oral Daily  . ranolazine  500 mg Oral BID  . senna-docusate  2 tablet Oral QHS  . sodium chloride  3 mL Intravenous Q12H  . traZODone  25 mg Oral QHS

## 2013-12-12 NOTE — Progress Notes (Signed)
Patient denies suicidal thoughts or harming someone else. When about to leave her room patient starting to beg this RN not to leave her room and starting to cry. Sitter at bedside finally came to sit with patient.

## 2013-12-12 NOTE — Progress Notes (Signed)
Chaplain followed up with Ms. Holly Ellison as promised on yesterday. She remembered my face and some of what we talked about yesterday. I'm not sure if she was being playful or actually forgot some things. My impression is that she was being playful about some things. Her son whom she mentioned yesterday was also present. She told me she was feeling better. Will continue to follow.   Page if needed  Delford Field, [place]

## 2013-12-12 NOTE — Consult Note (Signed)
Holly Ellison   Reason for Ellison:  Capacity evaluation Referring Physician:  Dr. Richardean Sale Holly Ellison is an 73 y.o. female. Total Time spent with patient: 45 minutes  Assessment: AXIS I:  Mood Disorder NOS AXIS II:  Deferred AXIS III:   Past Medical History  Diagnosis Date  . Chronic kidney disease     just found out? Placed graft in NOv-Dr. Tarri Glenn in [facility].  started dialysis in 06/2011  . Arthritis     Osteoarthritis  . Neuropathy in diabetes   . Insomnia   . Asthma   . CAD (coronary artery disease) 2012    7 stents-First PCI was in [place].  The last time she had a procedure was  a stent last year, thena  CABG was undertaken in October [facility])  . GERD (gastroesophageal reflux disease)   . Diabetes mellitus     Type II  . COPD (chronic obstructive pulmonary disease)   . Hypertension   . Peripheral vascular disease     endarterectomy  . Anemia   . CHF (congestive heart failure)   . Coronary artery dilation   . Myocardial infarction 2013    x 3  . 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 H/O Bell's palsy   . Frequent headaches   . Orthostasis    AXIS IV:  other psychosocial or environmental problems, problems related to social environment and problems with primary support group AXIS V:  41-50 serious symptoms  Plan: Case discussed with Dr. Darrick Meigs, Dr. Hilma Favors and patient son Recommend no additional psychotropic medications at this time Patient does not meet criteria for capacity to make her own medical decisions based on my evaluation today. Please contact psych social service if needs assistance with MCPOA No evidence of imminent risk to self or others at present.   Patient does not meet criteria for psychiatric inpatient admission. Supportive therapy provided about ongoing stressors. Discussed crisis plan, support from social network, calling 911, coming to the Emergency Department, and calling Suicide Hotline.   Appreciate psychiatric consultation and follow up as clinically required Please contact [phone] or [phone] if needs further assistance  Subjective:   Holly Ellison is a 73 y.o. female patient admitted with partially compliance with hemodialysis.  HPI:  Holly Ellison is a 73 y.o. female seen for face to face psych consultation in [facility] for capacity evaluation. Patient has orientation to her self, her son, name of the hospital but poorly cooperative with further evaluation. Patient son feels that she does not understand most of the questions asked, she has been verbally responding with significant delay or choosing not to respond to some of the questions. Patient stated that she is refusing her dialysis because staying in one position is too painful to her. She has denied suicidal / homicidal ideations, intention or plans. She has depressed mood and flat affect but she denies being depressed. She has poor understanding about her severity and complexity of her medical conditions and required treatment needs. She names her medical problems as restless leg syndrome and diabetes and proposed treatment is hemodialysis, which she has been receiving over one and half years. Patient has five children and her youngest son has POA.   Medical history: Patient  has a past medical history of Chronic kidney disease; Arthritis; Neuropathy in diabetes; Insomnia; Asthma; CAD (coronary artery disease) (2012); GERD (gastroesophageal reflux disease); Diabetes mellitus; COPD (chronic obstructive pulmonary disease); Hypertension; Peripheral  vascular disease; Anemia; CHF (congestive heart failure); Coronary artery dilation; Myocardial infarction (2013); Colloid cyst of brain; H/O Bell's palsy; Frequent headaches; and Orthostasis.   Patient has long-standing history of end-stage renal disease with poor compliance and her dialysis schedule, diabetes, coronary artery disease. Per history she has had unknown duration  of paraplegia secondary to unknown mass lesion that causing spinal cord compression status post decompressive laminectomy now had progressive worsening weakness had done an outpatient MRI scan showed a progressive pathologic fracture and collapse as well as worsening cord compression and cord edema. Patient was noted to be hypoxic while an MRI and was transferred to [facility] ER. Patient has been poorly responsive answering with mumbling to all portions and not following commands. It is unclear what her baseline is. No family is at bedside. Discussed her care with nephrology who is aware. Plain chest x-ray showed large pleural effusion. Nephrology was see in the morning and decide if she can have CT of the chest with contrast to evaluate this better with follow-up thoracentesis.    Past Psychiatric History: Past Medical History  Diagnosis Date  . Chronic kidney disease     just found out? Placed graft in NOv-Dr. Tarri Glenn in [facility].  started dialysis in 06/2011  . Arthritis     Osteoarthritis  . Neuropathy in diabetes   . Insomnia   . Asthma   . CAD (coronary artery disease) 2012    7 stents-First PCI was in [place].  The last time she had a procedure was  a stent last year, thena  CABG was undertaken in October [facility])  . GERD (gastroesophageal reflux disease)   . Diabetes mellitus     Type II  . COPD (chronic obstructive pulmonary disease)   . Hypertension   . Peripheral vascular disease     endarterectomy  . Anemia   . CHF (congestive heart failure)   . Coronary artery dilation   . Myocardial infarction 2013    x 3  . 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 H/O Bell's palsy   . Frequent headaches   . Orthostasis     reports that she quit smoking about 17 years ago. Her smoking use included Cigarettes. She smoked 0.00 packs per day. She has never used smokeless tobacco. She reports that she does not drink alcohol or use illicit drugs. Family History   Problem Relation Age of Onset  . Diabetes type II             Allergies:   Allergies  Allergen Reactions  . Dilaudid [Hydromorphone Hcl] Itching  . Morphine Sulfate Itching  . Opium Itching  . Ciprofloxacin Hcl Itching  . Lexapro [Escitalopram Oxalate] Other (See Comments)    Mental status changes/lethargy  . Procainamide Hcl Other (See Comments)    Unknown reaction  . Robaxin [Methocarbamol] Other (See Comments)    lethargy    ACT Assessment Complete:  NO Objective: Blood pressure 145/58, pulse 106, temperature 100 F (37.8 C), temperature source Oral, resp. rate 16, height 5' 7"  (1.702 m), weight 69.3 kg (152 lb 12.5 oz), SpO2 92 %.Body mass index is 23.92 kg/(m^2). Results for orders placed or performed during the hospital encounter of 12/06/13 (from the past 72 hour(s))  Glucose, capillary     Status: None   Collection Time: 12/09/13 12:08 PM  Result Value Ref Range   Glucose-Capillary 98 70 - 99 mg/dL  Lactate dehydrogenase, pleural or peritoneal  fluid     Status: Abnormal   Collection Time: 12/09/13  1:28 PM  Result Value Ref Range   LD, Fluid 90 (H) 3 - 23 U/L   Fluid Type-FLDH FLUID     Comment: RIGHT PLEURAL CORRECTED ON 11/02 AT 1350: PREVIOUSLY REPORTED AS Pleural R   Protein, pleural or peritoneal fluid     Status: None   Collection Time: 12/09/13  1:28 PM  Result Value Ref Range   Total protein, fluid 4.7 g/dL    Comment: NO NORMAL RANGE ESTABLISHED FOR THIS TEST   Fluid Type-FTP FLUID     Comment: RIGHT PLEURAL CORRECTED ON 11/02 AT 1350: PREVIOUSLY REPORTED AS Pleural R   Body fluid cell count with differential     Status: Abnormal   Collection Time: 12/09/13  1:28 PM  Result Value Ref Range   Fluid Type-FCT FLUID     Comment: RIGHT PLEURAL CORRECTED ON 11/02 AT 1349: PREVIOUSLY REPORTED AS Pleural R    Color, Fluid YELLOW YELLOW   Appearance, Fluid HAZY (A) CLEAR   WBC, Fluid 39 0 - 1000 cu mm   Neutrophil Count, Fluid 38 (H) 0 - 25 %    Lymphs, Fluid 2 %   Monocyte-Macrophage-Serous Fluid 60 50 - 90 %    Comment: PIGMENT LADEN MACROPHAGES NOTED.   Eos, Fluid 0 %   Other Cells, Fluid RARE MESOTHELIAL CELL.  %  Fungus Culture with Smear     Status: None (Preliminary result)   Collection Time: 12/09/13  1:28 PM  Result Value Ref Range   Specimen Description FLUID RIGHT PLEURAL    Special Requests NONE    Fungal Smear      NO YEAST OR FUNGAL ELEMENTS SEEN Performed at [facility]    Culture      CULTURE IN PROGRESS FOR FOUR WEEKS Performed at [facility]    Report Status PENDING   Body fluid culture     Status: None (Preliminary result)   Collection Time: 12/09/13  1:28 PM  Result Value Ref Range   Specimen Description FLUID RIGHT PLEURAL    Special Requests NONE    Gram Stain      RARE WBC PRESENT, PREDOMINANTLY MONONUCLEAR NO ORGANISMS SEEN Performed at [facility]    Culture      NO GROWTH 2 DAYS Performed at [facility]    Report Status PENDING   AFB culture with smear     Status: None (Preliminary result)   Collection Time: 12/09/13  1:28 PM  Result Value Ref Range   Specimen Description FLUID RIGHT PLEURAL    Special Requests NONE    Acid Fast Smear      NO ACID FAST BACILLI SEEN Performed at [facility]    Culture      CULTURE WILL BE EXAMINED FOR 6 WEEKS BEFORE ISSUING A FINAL REPORT Performed at [facility]    Report Status PENDING   Glucose, pleural or peritoneal fluid     Status: None   Collection Time: 12/09/13  1:28 PM  Result Value Ref Range   Glucose, Fluid 146 mg/dL    Comment:        FLUID GLUCOSE LEVELS OF <60 mg/dL OR VALUES OF 40 mg/dL LESS THAN A SIMULTANEOUS SERUM LEVEL ARE CONSIDERED DECREASED.    Fluid Type-FGLU FLUID     Comment: RIGHT PLEURAL CORRECTED ON 11/02 AT 1350: PREVIOUSLY REPORTED AS Pleural R   Glucose, capillary  Status: Abnormal   Collection Time: 12/09/13  5:05 PM  Result Value Ref Range    Glucose-Capillary 111 (H) 70 - 99 mg/dL  Glucose, capillary     Status: Abnormal   Collection Time: 12/09/13  8:13 PM  Result Value Ref Range   Glucose-Capillary 141 (H) 70 - 99 mg/dL  CBC     Status: Abnormal   Collection Time: 12/10/13  7:19 AM  Result Value Ref Range   WBC 10.6 (H) 4.0 - 10.5 K/uL   RBC 3.10 (L) 3.87 - 5.11 MIL/uL   Hemoglobin 9.3 (L) 12.0 - 15.0 g/dL   HCT 29.2 (L) 36.0 - 46.0 %   MCV 94.2 78.0 - 100.0 fL   MCH 30.0 26.0 - 34.0 pg   MCHC 31.8 30.0 - 36.0 g/dL   RDW 16.5 (H) 11.5 - 15.5 %   Platelets 460 (H) 150 - 400 K/uL  Renal function panel     Status: Abnormal   Collection Time: 12/10/13  7:19 AM  Result Value Ref Range   Sodium 131 (L) 137 - 147 mEq/L   Potassium 5.0 3.7 - 5.3 mEq/L   Chloride 90 (L) 96 - 112 mEq/L   CO2 27 19 - 32 mEq/L   Glucose, Bld 156 (H) 70 - 99 mg/dL   BUN 30 (H) 6 - 23 mg/dL   Creatinine, Ser 4.45 (H) 0.50 - 1.10 mg/dL   Calcium 8.6 8.4 - 10.5 mg/dL   Phosphorus 5.2 (H) 2.3 - 4.6 mg/dL   Albumin 2.0 (L) 3.5 - 5.2 g/dL   GFR calc non Af Amer 9 (L) >90 mL/min   GFR calc Af Amer 11 (L) >90 mL/min    Comment: (NOTE) The eGFR has been calculated using the CKD EPI equation. This calculation has not been validated in all clinical situations. eGFR's persistently <90 mL/min signify possible Chronic Kidney Disease.    Anion gap 14 5 - 15  Glucose, capillary     Status: Abnormal   Collection Time: 12/10/13 11:18 AM  Result Value Ref Range   Glucose-Capillary 119 (H) 70 - 99 mg/dL  Glucose, capillary     Status: Abnormal   Collection Time: 12/10/13  5:03 PM  Result Value Ref Range   Glucose-Capillary 257 (H) 70 - 99 mg/dL  Glucose, capillary     Status: Abnormal   Collection Time: 12/10/13  9:23 PM  Result Value Ref Range   Glucose-Capillary 288 (H) 70 - 99 mg/dL  Glucose, capillary     Status: Abnormal   Collection Time: 12/11/13  7:57 AM  Result Value Ref Range   Glucose-Capillary 187 (H) 70 - 99 mg/dL  Glucose, capillary      Status: Abnormal   Collection Time: 12/11/13 12:23 PM  Result Value Ref Range   Glucose-Capillary 227 (H) 70 - 99 mg/dL  Glucose, capillary     Status: Abnormal   Collection Time: 12/11/13  4:54 PM  Result Value Ref Range   Glucose-Capillary 201 (H) 70 - 99 mg/dL   Labs are reviewed.  Current Facility-Administered Medications  Medication Dose Route Frequency Provider Last Rate Last Dose  . acetaminophen (TYLENOL) tablet 650 mg  650 mg Oral Q6H PRN Toy Baker, MD   650 mg at 12/08/13 1844   Or  . acetaminophen (TYLENOL) suppository 650 mg  650 mg Rectal Q6H PRN Toy Baker, MD      . albuterol (PROVENTIL) (2.5 MG/3ML) 0.083% nebulizer solution 3 mL  3 mL Inhalation Q6H PRN Anastassia Doutova,  MD      . ALPRAZolam Duanne Moron) tablet 0.125 mg  0.125 mg Oral Daily PRN Annita Brod, MD      . amLODipine (NORVASC) tablet 5 mg  5 mg Oral Daily Toy Baker, MD   5 mg at 12/09/13 1441  . aspirin EC tablet 81 mg  81 mg Oral Daily Toy Baker, MD   81 mg at 12/11/13 0900  . atorvastatin (LIPITOR) tablet 40 mg  40 mg Oral QHS Toy Baker, MD   40 mg at 12/11/13 2201  . baclofen (LIORESAL) tablet 10 mg  10 mg Oral Q12H PRN Annita Brod, MD   10 mg at 12/08/13 1707  . bisacodyl (DULCOLAX) suppository 10 mg  10 mg Rectal Daily Toy Baker, MD   10 mg at 12/07/13 1041  . collagenase (SANTYL) ointment   Topical Daily Annita Brod, MD      . Darbepoetin Alfa (ARANESP) injection 50 mcg  50 mcg Intravenous Q Tue-HD Marlena Clipper, NP   50 mcg at 12/10/13 1050  . dextrose (GLUTOSE) 40 % oral gel 37.5 g  1 Tube Oral PRN Annita Brod, MD   37.5 g at 12/07/13 1655  . docusate sodium (COLACE) capsule 100 mg  100 mg Oral BID Toy Baker, MD   100 mg at 12/11/13 2200  . DULoxetine (CYMBALTA) DR capsule 30 mg  30 mg Oral Daily Brock Ra Lampkin, DO   30 mg at 12/11/13 0901  . feeding supplement (NEPRO CARB STEADY) liquid 237 mL  237 mL Oral  Q1500 Kallie Locks, RD   237 mL at 12/11/13 1500  . feeding supplement (RESOURCE BREEZE) (RESOURCE BREEZE) liquid 1 Container  1 Container Oral BID BM Kallie Locks, RD   1 Container at 12/11/13 1400  . fentaNYL (DURAGESIC - dosed mcg/hr) 12.5 mcg  12.5 mcg Transdermal Q72H Annita Brod, MD   12.5 mcg at 12/11/13 0909  . gabapentin (NEURONTIN) capsule 100 mg  100 mg Oral Once per day on Mon Wed Fri Brock Ra Lampkin, DO   100 mg at 12/11/13 6834  . hydrALAZINE (APRESOLINE) injection 10 mg  10 mg Intravenous Q4H PRN Toy Baker, MD      . insulin aspart (novoLOG) injection 0-9 Units  0-9 Units Subcutaneous TID WC Annita Brod, MD   3 Units at 12/11/13 1710  . lanthanum (FOSRENOL) chewable tablet 1,000 mg  1,000 mg Oral TID WC Toy Baker, MD   1,000 mg at 12/11/13 1710  . methimazole (TAPAZOLE) tablet 20 mg  20 mg Oral TID Annita Brod, MD   20 mg at 12/11/13 2200  . metoprolol tartrate (LOPRESSOR) tablet 25 mg  25 mg Oral BID Toy Baker, MD   25 mg at 12/11/13 2159  . multivitamin (RENA-VIT) tablet 1 tablet  1 tablet Oral QHS Marlena Clipper, NP   1 tablet at 12/11/13 2200  . ondansetron (ZOFRAN) tablet 4 mg  4 mg Oral Q6H PRN Toy Baker, MD   4 mg at 12/08/13 0424   Or  . ondansetron (ZOFRAN) injection 4 mg  4 mg Intravenous Q6H PRN Toy Baker, MD      . oxyCODONE (Oxy IR/ROXICODONE) immediate release tablet 5 mg  5 mg Oral Q4H PRN Annita Brod, MD   5 mg at 12/11/13 1919  . OxyCODONE (OXYCONTIN) 12 hr tablet 10 mg  10 mg Oral Q12H Annita Brod, MD   10 mg at 12/11/13 2204  . pantoprazole (PROTONIX)  EC tablet 40 mg  40 mg Oral Daily Toy Baker, MD   40 mg at 12/11/13 0901  . polyethylene glycol (MIRALAX / GLYCOLAX) packet 17 g  17 g Oral Daily Annita Brod, MD   17 g at 12/11/13 0901  . ranolazine (RANEXA) 12 hr tablet 500 mg  500 mg Oral BID Toy Baker, MD   500 mg at 12/11/13 2201  . sodium chloride 0.9 %  injection 3 mL  3 mL Intravenous Q12H Toy Baker, MD   3 mL at 12/11/13 0906  . topiramate (TOPAMAX) tablet 25 mg  25 mg Oral Daily Toy Baker, MD   25 mg at 12/11/13 0900  . traZODone (DESYREL) tablet 50 mg  50 mg Oral QHS Toy Baker, MD   50 mg at 12/11/13 2201    Psychiatric Specialty Exam: Physical Exam as per history and physical  Review of Systems  Constitutional: Positive for malaise/fatigue.  Gastrointestinal: Positive for nausea.  Musculoskeletal: Positive for myalgias and back pain.  Neurological: Positive for weakness.  Psychiatric/Behavioral: Positive for depression and memory loss. The patient is nervous/anxious and has insomnia.     Blood pressure 145/58, pulse 106, temperature 100 F (37.8 C), temperature source Oral, resp. rate 16, height 5' 7"  (1.702 m), weight 69.3 kg (152 lb 12.5 oz), SpO2 92 %.Body mass index is 23.92 kg/(m^2).  General Appearance: Guarded  Eye Contact::  Fair  Speech:  Blocked, Clear and Coherent and Slow  Volume:  Decreased  Mood:  Anxious and Worthless  Affect:  Depressed and Flat  Thought Process:  Coherent and Goal Directed  Orientation:  Full (Time, Place, and Person)  Thought Content:  WDL  Suicidal Thoughts:  No  Homicidal Thoughts:  No  Memory:  Immediate;   Fair Recent;   Poor  Judgement:  Impaired  Insight:  Lacking  Psychomotor Activity:  Psychomotor Retardation  Concentration:  Fair  Recall:  Poor  Fund of Knowledge:Fair  Language: Good  Akathisia:  NA  Handed:  Right  AIMS (if indicated):     Assets:  Agricultural consultant Housing Intimacy Leisure Time Social Support  Sleep:      Musculoskeletal: Strength & Muscle Tone: decreased Gait & Station: unable to stand Patient leans: N/A  Treatment Plan Summary: Daily contact with patient to assess and evaluate symptoms and progress in treatment Medication management  Recommend no additional psychotropic medications  at this time Patient does not meet criteria for capacity to make her own medical decisions based on my evaluation today. Please contact psych social service if needs assistance with MCPOA.    Holly Ellison,Holly R. 12/12/2013 10:12 AM

## 2013-12-12 NOTE — Progress Notes (Signed)
Inpatient Diabetes Program Recommendations  AACE/ADA: New Consensus Statement on Inpatient Glycemic Control (2013)  Target Ranges:  Prepandial:   less than 140 mg/dL      Peak postprandial:   less than 180 mg/dL (1-2 hours)      Critically ill patients:  140 - 180 mg/dL   Reason for Assessment:  Results for Holly Ellison, Holly Ellison (MRN [id number]) as of 12/12/2013 10:41  Ref. Range 12/10/2013 21:23 12/11/2013 07:57 12/11/2013 12:23 12/11/2013 16:54 12/12/2013 10:20  Glucose-Capillary Latest Range: 70-99 mg/dL 288 (H) 187 (H) 227 (H) 201 (H) 191 (H)    Diabetes history: Type 2 diabetes with Chronic kidney disease Outpatient Diabetes medications:  Lantus 10 units daily, Novolog correction  Current orders for Inpatient glycemic control:  Novolog sensitive tid with meals  May consider restarting Lantus 5 units daily.   Thanks, Adah Perl, RN, BC-ADM Inpatient Diabetes Coordinator Pager [phone]

## 2013-12-13 ENCOUNTER — Encounter (HOSPITAL_COMMUNITY): Payer: Self-pay

## 2013-12-13 ENCOUNTER — Inpatient Hospital Stay (HOSPITAL_COMMUNITY): Payer: Medicare Other

## 2013-12-13 DIAGNOSIS — D649 Anemia, unspecified: Secondary | ICD-10-CM | POA: Diagnosis present

## 2013-12-13 DIAGNOSIS — K089 Disorder of teeth and supporting structures, unspecified: Secondary | ICD-10-CM | POA: Diagnosis present

## 2013-12-13 DIAGNOSIS — M8448XA Pathological fracture, other site, initial encounter for fracture: Secondary | ICD-10-CM

## 2013-12-13 DIAGNOSIS — R634 Abnormal weight loss: Secondary | ICD-10-CM | POA: Diagnosis present

## 2013-12-13 DIAGNOSIS — S24109A Unspecified injury at unspecified level of thoracic spinal cord, initial encounter: Secondary | ICD-10-CM

## 2013-12-13 DIAGNOSIS — L8990 Pressure ulcer of unspecified site, unspecified stage: Secondary | ICD-10-CM | POA: Diagnosis present

## 2013-12-13 DIAGNOSIS — I10 Essential (primary) hypertension: Secondary | ICD-10-CM

## 2013-12-13 DIAGNOSIS — S22009A Unspecified fracture of unspecified thoracic vertebra, initial encounter for closed fracture: Secondary | ICD-10-CM | POA: Diagnosis present

## 2013-12-13 DIAGNOSIS — M4854XA Collapsed vertebra, not elsewhere classified, thoracic region, initial encounter for fracture: Secondary | ICD-10-CM

## 2013-12-13 LAB — CULTURE, BLOOD (ROUTINE X 2)
Culture: NO GROWTH
Culture: NO GROWTH

## 2013-12-13 LAB — GLUCOSE, CAPILLARY
GLUCOSE-CAPILLARY: 386 mg/dL — AB (ref 70–99)
Glucose-Capillary: 334 mg/dL — ABNORMAL HIGH (ref 70–99)
Glucose-Capillary: 356 mg/dL — ABNORMAL HIGH (ref 70–99)

## 2013-12-13 LAB — BODY FLUID CULTURE: CULTURE: NO GROWTH

## 2013-12-13 MED ORDER — INSULIN GLARGINE 100 UNIT/ML ~~LOC~~ SOLN
10.0000 [IU] | Freq: Every day | SUBCUTANEOUS | Status: DC
  Administered 2013-12-13: 10 [IU] via SUBCUTANEOUS
  Filled 2013-12-13 (×2): qty 0.1

## 2013-12-13 MED ORDER — INSULIN ASPART 100 UNIT/ML ~~LOC~~ SOLN
0.0000 [IU] | Freq: Three times a day (TID) | SUBCUTANEOUS | Status: DC
  Administered 2013-12-13: 11 [IU] via SUBCUTANEOUS
  Administered 2013-12-13: 15 [IU] via SUBCUTANEOUS
  Administered 2013-12-14: 3 [IU] via SUBCUTANEOUS
  Administered 2013-12-14: 8 [IU] via SUBCUTANEOUS
  Administered 2013-12-14: 5 [IU] via SUBCUTANEOUS
  Administered 2013-12-15: 8 [IU] via SUBCUTANEOUS
  Administered 2013-12-15: 15 [IU] via SUBCUTANEOUS
  Administered 2013-12-15: 3 [IU] via SUBCUTANEOUS
  Administered 2013-12-16: 8 [IU] via SUBCUTANEOUS
  Administered 2013-12-16: 2 [IU] via SUBCUTANEOUS
  Administered 2013-12-16: 5 [IU] via SUBCUTANEOUS
  Administered 2013-12-17 (×2): 8 [IU] via SUBCUTANEOUS
  Administered 2013-12-18 (×2): 11 [IU] via SUBCUTANEOUS
  Administered 2013-12-18: 15 [IU] via SUBCUTANEOUS
  Administered 2013-12-19: 5 [IU] via SUBCUTANEOUS
  Administered 2013-12-19 (×2): 3 [IU] via SUBCUTANEOUS

## 2013-12-13 MED ORDER — IOHEXOL 300 MG/ML  SOLN
100.0000 mL | Freq: Once | INTRAMUSCULAR | Status: AC | PRN
  Administered 2013-12-13: 100 mL via INTRAVENOUS

## 2013-12-13 NOTE — Evaluation (Signed)
Physical Therapy Evaluation Patient Details Name: Holly Ellison MRN: [id number] DOB: [birth year] Today's Date: 12/13/2013   History of Present Illness  73 y.o. female admitted to [facility] on 12/06/13 with AMS, hypoxia. Pt with significant PMHx of CKD, neuropathy, CAD, DM, COPD, HTN, PVD, anemia, CHF, MI, HA, orthostasis, CABG, and thoracic laminectomy due to pathologic fx on 07/16/13.  Clinical Impression  Pt is at her baseline level of mobility which is total assist using lift for transfers, and mod to max assist for bed mobility.  She reports the painful spasms in her back are what is making her life miserable.  Palliative medicine is involved and she did not have any pain with my bed level assessment today.  Since she is at baseline, she does not have any acute PT needs at this time.  We will defer any further therapy to SNF.  PT to sign off.      Follow Up Recommendations SNF    Equipment Recommendations  None recommended by PT    Recommendations for Other Services   NA    Precautions / Restrictions Precautions Precautions: Fall;Other (comment) Precaution Comments: palliative goals of care to control pain.  Please be gentle when mobilizing pt.       Mobility  Bed Mobility Overal bed mobility: Needs Assistance Bed Mobility: Rolling Rolling: Mod assist         General bed mobility comments: Mod assist to support her legs and pelvis and to position knees and hips in flexion to help with rolling.  Pt was able to help with bil upper extremities on bed rail to control her upper body and upper trunk.  She was also able to help pull herself up in the bed with mod assist to position knees in flexion with bed in max trendelenberg.                   Stairs                         Pertinent Vitals/Pain Pain Assessment: No/denies pain    Home Living Family/patient expects to be discharged to:: Skilled nursing facility                      Prior Function Level  of Independence: Needs assistance   Gait / Transfers Assistance Needed: pt reports that SNF has started lifting her to a WC with a hoyer lift.  She is no longer active with PT and she gets painful spasms in her back and legs when sitting up in the chair. She does report she helps as much as she can with bed mobility, rolling and pulling up in the bed as she can.   ADL's / Homemaking Assistance Needed: total assist        Hand Dominance   Dominant Hand: Right    Extremity/Trunk Assessment   Upper Extremity Assessment: RUE deficits/detail;LUE deficits/detail RUE Deficits / Details: both arms generally weak right 4/5 with poor grip partially due to arthritic hands.       LUE Deficits / Details: Left arm with 4/5 strength except elbow extension is 3+/5, and poor grip due to arthritic changes in her hands.    Lower Extremity Assessment: RLE deficits/detail;LLE deficits/detail RLE Deficits / Details: bil legs with dense paraplegia with no sensation up to about pt's umbilicus.  Pt doe have some visible spasms and muscle twitiching when she is still in bil legs and some mild extension  tone when taking her left knee and hip up into flexion.  ROM remains good without signs of contracture.  Right heel has a wound and she has a blue prevlon boot donned to help with pressure relief.   LLE Deficits / Details: bil legs with dense paraplegia with no sensation up to about pt's umbilicus.  Pt doe have some visible spasms and muscle twitiching when she is still in bil legs and some mild extension tone when taking her left knee and hip up into flexion.  ROM remains good without signs of contracture.  Right heel has a wound and she has a blue prevlon boot donned to help with pressure relief.    Cervical / Trunk Assessment: Other exceptions  Communication      Cognition Arousal/Alertness: Awake/alert Behavior During Therapy: WFL for tasks assessed/performed Overall Cognitive Status: No family/caregiver  present to determine baseline cognitive functioning                      General Comments General comments (skin integrity, edema, etc.): Pt with significant saral wound. Sitter in room reports she let RN know that the bandage came off and she needs a new bandage.  Positioned on her side after session to keep pressure off the wound.           Assessment/Plan    PT Assessment All further PT needs can be met in the next venue of care  PT Diagnosis Difficulty walking;Abnormality of gait;Generalized weakness;Other (comment) (paraplegia)   PT Problem List Decreased strength;Decreased activity tolerance;Decreased balance;Decreased mobility;Decreased knowledge of use of DME;Impaired sensation;Impaired tone;Decreased skin integrity;Pain  PT Treatment Interventions     PT Goals (Current goals can be found in the Care Plan section) Acute Rehab PT Goals Patient Stated Goal: to get back to SNF PT Goal Formulation: All assessment and education complete, DC therapy     End of Session   Activity Tolerance: Patient tolerated treatment well Patient left: in bed;with call bell/phone within reach;with nursing/sitter in room Nurse Communication: Other (comment) (needs new sacral dressing via call bell)         Time: 1980-2217 PT Time Calculation (min): 19 min   Charges:   PT Evaluation $Initial PT Evaluation Tier I: 1 Procedure          Brooke Payes B. [place], [place], DPT [phone]   12/13/2013, 5:27 PM

## 2013-12-13 NOTE — Progress Notes (Signed)
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 Palliative care has seen the patient and started on Decadron to reduce the cord edema as well as better pain control. 2. Chronic back pain- patient has chronic back pain from the progressive pathologic fracture of the T10-11 vertebrae, continue baclofen 10 mg every 12 hours when necessary,fentanyl patch has been increased to 25 g every 72 hours, Neurontin 100 mg 3 times weekly. Started back on Cymbalta, Topamax discontinued. 3. Pleural effusion- CT chest showed large right pleural effusion, thoracentesis was performed and cytology reveals mixed inflammatory cells, reactive appearing mesothelial cells.pulmonary is following, and recommended repeat thoracentesis with a repeat cytology. Will obtain clear-cut thoracentesis and again send the fluid for cytology. 4. Suicidal ideation- patient complained of suicidal ideation today, one is to one sitter, and psych consult obtained.Will follow psych recommendations. 5. End-stage renal disease- patient is currently on hemodialysis, and is refusing to undergo further hemodialysis. Discussed with Dr. Jonnie Finner,  .[name] is at this time he would not who says that he would not put this patient for hemodialysis at this time. 6. Hyperthyroidism- Continue Tapazole, the dose has now increased to 20 mg po TID 7. Diabetes Mellitus- sliding-scale insulin with novolog and also start the lantus 10 units subcut daily.    Code Status: Full code Family Communication: discussed with patient's son, Dr. [name]. As per palliative care patient does not have capacity to make  decisions, so we'll have to follow patient's son's wishes for further procedures. Disposition Plan:  SNF   Consultants:  Nephrology  Neurology   Palliative care  Procedures:  Thoracentesis  Antibiotics:  None  HPI/Subjective: 73 year old female with past medical history of end-stage renal disease and intermittent compliance issues with dialysis who earlier this year underwent a decompressive thoracic laminectomy for unclear spinal process which was causing cord compression and edema presented to the emergency room on 10/30 with complaints of worsening shortness of breath times several days plus progression and loss of her right sided extremities. MRI thoracic spine demonstrated further spinal cord compression of vertebral destruction and seen by neurosurgery who felt that she would not be an operative candidate. On chest x-ray patient also noted to have large left-sided pleural effusion not present last month on x-ray. Initially when she was admitted, patient's mentation in question. Patient admitted to the hospitalist service. Nephrology consulted Seen by pulmonary and interventional radiology and underwent thoracentesis of the right lung with 1.2 L of fluid removed. Patient has also been followed by palliative care but deciding on goals of care has been complicated due to family issues and patient's wavering mentation which may or may not be chronic.  Patient seen and examined, no new complaints.  CT abdomen pelvis obtained which does not show any abnormal masses.   Objective: Filed Vitals:   12/13/13 0941  BP: 161/68  Pulse: 97  Temp:   Resp:     Intake/Output Summary (Last 24 hours) at 12/13/13 1518 Last data filed at 12/13/13 1300  Gross per 24 hour  Intake   1880 ml  Output   2501 ml  Net   -621 ml   Filed Weights   12/10/13 2112 12/11/13 2027 12/12/13 2038  Weight: 68.099  kg (150 lb 2.1 oz) 69.3 kg (152 lb 12.5 oz) 67.6 kg (149 lb 0.5 oz)    Exam:  Physical  Exam: Head: Normocephalic, atraumatic.  Eyes: No signs of jaundice, EOMI Lungs: Normal respiratory effort. B/L Clear to auscultation, no crackles or wheezes.  Heart: Regular RR. S1 and S2 normal  Abdomen: BS normoactive. Soft, Nondistended, non-tender.  Extremities: No pretibial edema, no erythema   Data Reviewed: Basic Metabolic Panel:  Recent Labs Lab 12/06/13 1617 12/06/13 1624 12/07/13 0604 12/10/13 0719 12/12/13 1000  NA 139 136* 138 131* 132*  K 3.7 3.4* 3.7 5.0 4.6  CL 94* 93* 95* 90* 90*  CO2 32  --  28 27 25   GLUCOSE 77 78 62* 156* 181*  BUN 22 22 26* 30* 25*  CREATININE 2.85* 2.90* 3.28* 4.45* 4.06*  CALCIUM 9.3  --  9.1 8.6 9.0  MG  --   --  2.5  --   --   PHOS  --   --  4.2 5.2*  --    Liver Function Tests:  Recent Labs Lab 12/06/13 1617 12/07/13 0604 12/10/13 0719 12/12/13 1000  AST 29 25  --  29  ALT 23 19  --  28  ALKPHOS 155* 145*  --  160*  BILITOT 0.5 0.5  --  0.4  PROT 7.6 7.0  --  7.2  ALBUMIN 2.2* 2.0* 2.0* 2.1*   No results for input(s): LIPASE, AMYLASE in the last 168 hours. No results for input(s): AMMONIA in the last 168 hours. CBC:  Recent Labs Lab 12/06/13 1617 12/06/13 1624 12/07/13 0604 12/10/13 0719 12/12/13 1000  WBC 13.5*  --  10.3 10.6* 11.5*  HGB 10.5* 12.6 9.7* 9.3* 9.3*  HCT 33.0* 37.0 30.2* 29.2* 29.7*  MCV 97.6  --  94.4 94.2 94.9  PLT 496*  --  487* 460* 459*   Cardiac Enzymes:  Recent Labs Lab 12/06/13 1713  TROPONINI <0.30   BNP (last 3 results) No results for input(s): PROBNP in the last 8760 hours. CBG:  Recent Labs Lab 12/12/13 1020 12/12/13 1205 12/12/13 1620 12/12/13 2204 12/13/13 1205  GLUCAP 191* 182* 138* 307* 334*    Recent Results (from the past 240 hour(s))  MRSA PCR Screening     Status: None   Collection Time: 12/07/13  1:03 AM  Result Value Ref Range Status   MRSA by PCR NEGATIVE NEGATIVE Final    Comment:        The GeneXpert MRSA Assay (FDA approved for NASAL  specimens only), is one component of a comprehensive MRSA colonization surveillance program. It is not intended to diagnose MRSA infection nor to guide or monitor treatment for MRSA infections.  Culture, blood (routine x 2)     Status: None   Collection Time: 12/07/13 10:52 AM  Result Value Ref Range Status   Specimen Description BLOOD RIGHT HAND  Final   Special Requests BOTTLES DRAWN AEROBIC ONLY 1CC  Final   Culture  Setup Time   Final    12/07/2013 20:10 Performed at [facility]    Culture   Final    NO GROWTH 5 DAYS Performed at [facility]    Report Status 12/13/2013 FINAL  Final  Culture, blood (routine x 2)     Status: None   Collection Time: 12/07/13 11:00 AM  Result Value Ref Range Status   Specimen Description BLOOD RIGHT HAND  Final   Special Requests BOTTLES DRAWN AEROBIC ONLY 5CC  Final  Culture  Setup Time   Final    12/07/2013 20:10 Performed at [facility]    Culture   Final    NO GROWTH 5 DAYS Performed at [facility]    Report Status 12/13/2013 FINAL  Final  Fungus Culture with Smear     Status: None (Preliminary result)   Collection Time: 12/09/13  1:28 PM  Result Value Ref Range Status   Specimen Description FLUID RIGHT PLEURAL  Final   Special Requests NONE  Final   Fungal Smear   Final    NO YEAST OR FUNGAL ELEMENTS SEEN Performed at [facility]    Culture   Final    CULTURE IN PROGRESS FOR FOUR WEEKS Performed at [facility]    Report Status PENDING  Incomplete  Body fluid culture     Status: None   Collection Time: 12/09/13  1:28 PM  Result Value Ref Range Status   Specimen Description FLUID RIGHT PLEURAL  Final   Special Requests NONE  Final   Gram Stain   Final    RARE WBC PRESENT, PREDOMINANTLY MONONUCLEAR NO ORGANISMS SEEN Performed at [facility]    Culture   Final    NO GROWTH 3 DAYS Performed at [facility]    Report Status 12/13/2013 FINAL  Final   AFB culture with smear     Status: None (Preliminary result)   Collection Time: 12/09/13  1:28 PM  Result Value Ref Range Status   Specimen Description FLUID RIGHT PLEURAL  Final   Special Requests NONE  Final   Acid Fast Smear   Final    NO ACID FAST BACILLI SEEN Performed at [facility]    Culture   Final    CULTURE WILL BE EXAMINED FOR 6 WEEKS BEFORE ISSUING A FINAL REPORT Performed at [facility]    Report Status PENDING  Incomplete     Studies: Ct Abdomen Pelvis W Contrast  12/13/2013   ADDENDUM REPORT: 12/13/2013 08:57  ADDENDUM: Regarding the area of luminal narrowing and mild wall thickening in the sigmoid colon, this does not appear overtly concerning for neoplasm on today's study although it is somewhat unusual for this same area to be narrow on the most recent comparison is well. This region appear normal on the CT scan before. It may represent area of evolving stenosis although there is no substantial diverticular change in the region. As warranted in this individual, sigmoidoscopy may prove helpful to further evaluate.   Electronically Signed   By: Misty Stanley M.D.   On: 12/13/2013 08:57   12/13/2013   CLINICAL DATA:  Subsequent encounter for thoracic spine mass.  EXAM: CT ABDOMEN AND PELVIS WITH CONTRAST  TECHNIQUE: Multidetector CT imaging of the abdomen and pelvis was performed using the standard protocol following bolus administration of intravenous contrast.  CONTRAST:  18mL OMNIPAQUE IOHEXOL 300 MG/ML  SOLN  COMPARISON:  12/07/1948 year 07/10/2013.  FINDINGS: Lower chest: The right pleural effusion has decreased in the interval and is now moderate in size. There is bibasilar collapse/ consolidation in the dependent lower lobes.  Hepatobiliary: No focal abnormality within the liver parenchyma. Layering tiny stones are seen in the gallbladder lumen. No gallbladder wall thickening or pericholecystic fluid. No intrahepatic or extrahepatic biliary dilation.   Pancreas: No focal mass lesion. No dilatation of the main duct. No intraparenchymal cyst. No peripancreatic edema.  Spleen: No splenomegaly. No focal mass lesion.  Adrenals/Urinary Tract: No adrenal nodule  or mass. There is no evidence for hydronephrosis or renal mass lesion. The urinary bladder is distended.  Stomach/Bowel: Stomach is nondistended. No gastric wall thickening. No evidence of outlet obstruction. Duodenum is normally positioned as is the ligament of Treitz. No small bowel wall thickening. No small bowel dilatation. Terminal ileum is normal. The appendix is normal.  On image 65 of series 2, there is a focal area of narrowing in the sigmoid colon. While this may be related to peristalsis, there are no other similar appearing areas in the colon and there was similar luminal narrowing with wall thickening at this location on the study from 12/07/2013 although the appearance was less prominent on the study from 07/10/2013.  Vascular/Lymphatic: Atherosclerotic calcification is noted in the wall of the abdominal aorta without aneurysm. There is no gastrohepatic or hepatoduodenal ligament lymphadenopathy. Portal vein is patent. No retroperitoneal lymphadenopathy. There is no pelvic sidewall lymphadenopathy.  Reproductive: Uterus is surgically absent. There is no adnexal mass.  Other: Diffuse body wall edema noted. No intraperitoneal free fluid.  Musculoskeletal: The abnormal appearance of T9, T10, and T11 is again noted with near complete light cysts of the T10 vertebral body and most of the T11 vertebral body. Comparing to 10/30 1/15, the bony anatomy in this region is relatively stable in appearance however there has been an interval increase in abnormal soft tissue seen anterior to the T10 and T11 vertebral bodies.  IMPRESSION: Relatively stable appearance of the T9, T10, and T11 vertebral bodies although there is been interval increase in the abnormal paraspinal soft tissue at this level. The increase is  evident, but very subtle on axial imaging, and best appreciated on the sagittal reconstructions. This region was biopsied during laminectomy on 07/16/2013 and pathology report at that time found no evidence of malignancy.  Interval decrease in the moderate size right pleural effusion with associated bibasilar collapse/ consolidation.  Cholelithiasis.  Diffuse body wall edema.  Electronically Signed: By: Misty Stanley M.D. On: 12/13/2013 08:33    Scheduled Meds: . amLODipine  5 mg Oral Daily  . aspirin EC  81 mg Oral Daily  . atorvastatin  40 mg Oral QHS  . collagenase   Topical Daily  . darbepoetin (ARANESP) injection - DIALYSIS  50 mcg Intravenous Q Tue-HD  . dexamethasone  10 mg Intravenous Q24H  . docusate sodium  100 mg Oral BID  . DULoxetine  30 mg Oral Daily  . feeding supplement (NEPRO CARB STEADY)  237 mL Oral Q1500  . feeding supplement (RESOURCE BREEZE)  1 Container Oral BID BM  . [START ON 12/14/2013] fentaNYL  25 mcg Transdermal Q72H  . gabapentin  100 mg Oral Once per day on Mon Wed Fri  . insulin aspart  0-15 Units Subcutaneous TID WC  . insulin glargine  10 Units Subcutaneous QHS  . lanthanum  1,000 mg Oral TID WC  . methimazole  20 mg Oral TID  . metoprolol tartrate  25 mg Oral BID  . multivitamin  1 tablet Oral QHS  . pantoprazole  40 mg Oral Daily  . polyethylene glycol  17 g Oral Daily  . ranolazine  500 mg Oral BID  . senna-docusate  2 tablet Oral QHS  . sodium chloride  3 mL Intravenous Q12H  . 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 If 7PM-7AM, please contact night-coverage at [email or web address], password [facility] 12/13/2013, 3:18 PM  LOS: 7 days

## 2013-12-13 NOTE — Consult Note (Signed)
[facility]    Date of Admission:  12/06/2013           Reason for Consult: Chronic, progressive thoracic spine fracture and collapse with cord compression and exudative left pleural effusion of unknown etiology    Referring Physician: Dr. Eleonore Chiquito  Principal Problem:   Fracture of thoracic spine with cord lesion Active Problems:   Paraparesis   Compression of spinal cord with myelopathy   Pleural effusion   Pain   Atherosclerosis of native arteries of the extremities with ulceration   CAD (coronary artery disease) of bypass graft   Diabetes mellitus   Hypertension   COPD (chronic obstructive pulmonary disease)   Hyperthyroidism   ESRD on dialysis   Altered mental state   Acute delirium   Palliative care encounter   CN (constipation)   Acute respiratory failure with hypoxia   Somnolence   Type 2 diabetes mellitus with diabetic chronic kidney disease   Pressure ulcer   Normocytic anemia   Poor dentition   Unintentional weight loss   . amLODipine  5 mg Oral Daily  . aspirin EC  81 mg Oral Daily  . atorvastatin  40 mg Oral QHS  . collagenase   Topical Daily  . darbepoetin (ARANESP) injection - DIALYSIS  50 mcg Intravenous Q Tue-HD  . dexamethasone  10 mg Intravenous Q24H  . docusate sodium  100 mg Oral BID  . DULoxetine  30 mg Oral Daily  . feeding supplement (NEPRO CARB STEADY)  237 mL Oral Q1500  . feeding supplement (RESOURCE BREEZE)  1 Container Oral BID BM  . [START ON 12/14/2013] fentaNYL  25 mcg Transdermal Q72H  . gabapentin  100 mg Oral Once per day on Mon Wed Fri  . insulin aspart  0-15 Units Subcutaneous TID WC  . insulin glargine  10 Units Subcutaneous QHS  . lanthanum  1,000 mg Oral TID WC  . methimazole  20 mg Oral TID  . metoprolol tartrate  25 mg Oral BID  . multivitamin  1 tablet Oral QHS  . pantoprazole  40 mg Oral Daily  . polyethylene glycol  17 g Oral Daily  . ranolazine  500 mg Oral BID  .  senna-docusate  2 tablet Oral QHS  . sodium chloride  3 mL Intravenous Q12H  . traZODone  25 mg Oral QHS    Recommendations: 1. Continue observation off of antibiotics 2. Discontinue airborne precautions 3. Order adenosine deaminase and M tb PCR on remaining pleural fluid and await pleural fluid culture results  4. Pain control  Assessment: The cause of her chronic, progressive thoracic spine lesion leading to collapse with cord compression and exudative left pleural effusion remains unknown. I doubt that it is due to tuberculosis osteomyelitis. I would have expected to see necrotizing granulomas on her operative biopsy. Her negative IGRA does not absolutely rule out tuberculosis but it makes it much less likely. Also, tuberculosis pleural effusions usually have more white blood cells with a predominance of lymphocytes, much higher levels of LDH, and few mesothelial cells. I favor discontinuing airborne precautions now and focusing on improving her comfort. I think that repeat thoracic spine biopsy would be a very low yield and she is expressing a desire to avoid potentially painful interventions. We will follow-up on Monday, November 9.   HPI: Holly Ellison is a 73 y.o. female with multiple chronic medical who was admitted to the hospital in early  June with progressive weakness, fecal incontinence and severe back pain. She was found to have a lytic lesion in T11 with cord compression. She underwent operative decompression with biopsy. The biopsy revealed only dense fibrocartilaginous tissue without evidence for malignancy or infection. The biopsy was not sent for culture. She was afebrile and blood cultures were negative at that time. She had been hospitalized one year earlier with MSSA bacteremia. She was treated with IV cefazolin after hemodialysis for 4 weeks. She denied having any back pain at that time.  Hospital progress notes indicate that she improved after decompression this past June.  However she and her son, Brita Romp, told me that her pain and weakness at worse right after the surgery and has progressively worsened since that time. She underwent a repeat outpatient MRI which is reported to have shown "progressive pathologic fracture and collapse with cord compression." she was readmitted on October 30. Dr. Saintclair Halsted, neurosurgery, did not feel that any operative intervention would improve her paraparesis. She underwent thoracentesis for her left effusion. Pleural fluid was described as dark yellow. It revealed 39 white blood cells with a predominance of monocytes. The protein was elevated at 4.7 and the LDH was 90. Cytology showed only reactive mesothelial cells. Gram stain and routine culture are negative. Fungal stain is negative. AFB stain is negative and AFB cultures are pending. CT scan of her abdomen did not reveal any significant pulmonary infiltrates in the lower lungs but only compressive atelectasis. An interferon gamma release assay (IGRA) for latent tuberculosis is negative. I spoke with her son today and he states that he is not aware of her having had any cough, fevers, chills, or sweats recently.  Because of some concerns about possible tuberculous osteomyelitis she has been placed in airborne precautions. She states that her severe back pain is no better. She has extreme difficulty tolerating the pain when she has to sit up in a chair for hemodialysis. She also is complaining of severe muscle spasms. She also told me that she does not want to be stuck with needles anymore.  Review of Systems: Constitutional: positive for anorexia, malaise and weight loss, negative for chills, fevers, night sweats and sweats Eyes: negative Ears, nose, mouth, throat, and face: negative Respiratory: negative Cardiovascular: negative Gastrointestinal: negative Genitourinary:negative  Past Medical History  Diagnosis Date  . Chronic kidney disease     just found out? Placed graft in [facility]-Dr.  Tarri Glenn in [facility].  started dialysis in 06/2011  . Arthritis     Osteoarthritis  . Neuropathy in diabetes   . Insomnia   . Asthma   . CAD (coronary artery disease) 2012    7 stents-First PCI was in [place].  The last time she had a procedure was  a stent last year, thena  CABG was undertaken in October [facility])  . GERD (gastroesophageal reflux disease)   . Diabetes mellitus     Type II  . COPD (chronic obstructive pulmonary disease)   . Hypertension   . Peripheral vascular disease     endarterectomy  . Anemia   . CHF (congestive heart failure)   . Coronary artery dilation   . Myocardial infarction 2013    x 3  . 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 H/O Bell's palsy   . Frequent headaches   . Orthostasis     History  Substance Use Topics  . Smoking status: Former Smoker    Types: Cigarettes  Quit date: 05/24/1996  . Smokeless tobacco: Never Used  . Alcohol Use: No    Family History  Problem Relation Age of Onset  . Diabetes type II     Allergies  Allergen Reactions  . Morphine Sulfate Itching  . Opium Itching  . Ciprofloxacin Hcl Itching  . Lexapro [Escitalopram Oxalate] Other (See Comments)    Mental status changes/lethargy  . Procainamide Hcl Other (See Comments)    Unknown reaction  . Robaxin [Methocarbamol] Other (See Comments)    lethargy    OBJECTIVE: Blood pressure 161/68, pulse 97, temperature 98.3 F (36.8 C), temperature source Oral, resp. rate 17, height 5\' 7"  (1.702 m), weight 149 lb 0.5 oz (67.6 kg), SpO2 97 %. General: she is currently eating her lunch. She is alert, interactive and talkative. She is intermittently tearful during the exam Skin: no rash. Left upper arm AV fistula site appears normal Lungs: clear anteriorly Cor: regular S1 and S2 with no murmurs heard Abdomen: soft and nontender   Lab Results Lab Results  Component Value Date   WBC 11.5* 12/12/2013   HGB 9.3* 12/12/2013   HCT 29.7*  12/12/2013   MCV 94.9 12/12/2013   PLT 459* 12/12/2013    Lab Results  Component Value Date   CREATININE 4.06* 12/12/2013   BUN 25* 12/12/2013   NA 132* 12/12/2013   K 4.6 12/12/2013   CL 90* 12/12/2013   CO2 25 12/12/2013    Lab Results  Component Value Date   ALT 28 12/12/2013   AST 29 12/12/2013   ALKPHOS 160* 12/12/2013   BILITOT 0.4 12/12/2013     Microbiology: Recent Results (from the past 240 hour(s))  MRSA PCR Screening     Status: None   Collection Time: 12/07/13  1:03 AM  Result Value Ref Range Status   MRSA by PCR NEGATIVE NEGATIVE Final    Comment:        The GeneXpert MRSA Assay (FDA approved for NASAL specimens only), is one component of a comprehensive MRSA colonization surveillance program. It is not intended to diagnose MRSA infection nor to guide or monitor treatment for MRSA infections.  Culture, blood (routine x 2)     Status: None   Collection Time: 12/07/13 10:52 AM  Result Value Ref Range Status   Specimen Description BLOOD RIGHT HAND  Final   Special Requests BOTTLES DRAWN AEROBIC ONLY 1CC  Final   Culture  Setup Time   Final    12/07/2013 20:10 Performed at [facility]    Culture   Final    NO GROWTH 5 DAYS Performed at [facility]    Report Status 12/13/2013 FINAL  Final  Culture, blood (routine x 2)     Status: None   Collection Time: 12/07/13 11:00 AM  Result Value Ref Range Status   Specimen Description BLOOD RIGHT HAND  Final   Special Requests BOTTLES DRAWN AEROBIC ONLY 5CC  Final   Culture  Setup Time   Final    12/07/2013 20:10 Performed at [facility]    Culture   Final    NO GROWTH 5 DAYS Performed at [facility]    Report Status 12/13/2013 FINAL  Final  Fungus Culture with Smear     Status: None (Preliminary result)   Collection Time: 12/09/13  1:28 PM  Result Value Ref Range Status   Specimen Description FLUID RIGHT PLEURAL  Final   Special Requests NONE  Final   Fungal Smear  Final    NO YEAST OR FUNGAL ELEMENTS SEEN Performed at [facility]    Culture   Final    CULTURE IN PROGRESS FOR FOUR WEEKS Performed at [facility]    Report Status PENDING  Incomplete  Body fluid culture     Status: None   Collection Time: 12/09/13  1:28 PM  Result Value Ref Range Status   Specimen Description FLUID RIGHT PLEURAL  Final   Special Requests NONE  Final   Gram Stain   Final    RARE WBC PRESENT, PREDOMINANTLY MONONUCLEAR NO ORGANISMS SEEN Performed at [facility]    Culture   Final    NO GROWTH 3 DAYS Performed at [facility]    Report Status 12/13/2013 FINAL  Final  AFB culture with smear     Status: None (Preliminary result)   Collection Time: 12/09/13  1:28 PM  Result Value Ref Range Status   Specimen Description FLUID RIGHT PLEURAL  Final   Special Requests NONE  Final   Acid Fast Smear   Final    NO ACID FAST BACILLI SEEN Performed at [facility]    Culture   Final    CULTURE WILL BE EXAMINED FOR 6 WEEKS BEFORE ISSUING A FINAL REPORT Performed at [facility]    Report Status PENDING  Incomplete    Michel Bickers, MD [facility] [phone] pager   [phone] cell 12/13/2013, 2:52 PM

## 2013-12-13 NOTE — Care Management Note (Signed)
CARE MANAGEMENT NOTE 12/13/2013  Patient:  Holly Ellison, Holly Ellison   Account Number:  [id number]  Date Initiated:  12/09/2013  Documentation initiated by:  Briasia Flinders  Subjective/Objective Assessment:   CM following for planning and progression.     Action/Plan:   Pt plan for SNF placement.   Anticipated DC Date:     Anticipated DC Plan:  SKILLED NURSING FACILITY         Choice offered to / List presented to:             Status of service:   Medicare Important Message given?  YES (If response is "NO", the following Medicare IM given date fields will be blank) Date Medicare IM given:  12/09/2013 Medicare IM given by:  Shaletha Humble Date Additional Medicare IM given:  12/13/2013 Additional Medicare IM given by:  [facility]  Discharge Disposition:    Per UR Regulation:    If discussed at Long Length of Stay Meetings, dates discussed:    Comments:  12/14/2014 Have asked [facility] rep to review this pt chart for possible adm to LTAC.   CRoyal RN MPH, case manager, [phone]

## 2013-12-13 NOTE — Progress Notes (Signed)
Inpatient Diabetes Program Recommendations  AACE/ADA: New Consensus Statement on Inpatient Glycemic Control (2013)  Target Ranges:  Prepandial:   less than 140 mg/dL      Peak postprandial:   less than 180 mg/dL (1-2 hours)      Critically ill patients:  140 - 180 mg/dL   Reason for Assessment:  Results for Holly Ellison, Holly Ellison (MRN [id number]) as of 12/13/2013 11:16  Ref. Range 12/12/2013 22:04  Glucose-Capillary Latest Range: 70-99 mg/dL 307 (H)   According to RN documentation, CBG was 370 mg/dL  Diabetes history:  Diabetes mellitus Outpatient Diabetes medications: Lantus 10 units daily, Novolog correction scale Current orders for Inpatient glycemic control:  CBG's elevated with the addition of Decadron. Consider restarting Lantus 10 units daily.  Thanks, Adah Perl, RN, BC-ADM Inpatient Diabetes Coordinator Pager [phone]

## 2013-12-14 ENCOUNTER — Inpatient Hospital Stay (HOSPITAL_COMMUNITY): Payer: Medicare Other

## 2013-12-14 LAB — RENAL FUNCTION PANEL
Albumin: 2.2 g/dL — ABNORMAL LOW (ref 3.5–5.2)
Anion gap: 16 — ABNORMAL HIGH (ref 5–15)
BUN: 33 mg/dL — ABNORMAL HIGH (ref 6–23)
CO2: 26 mEq/L (ref 19–32)
Calcium: 9.1 mg/dL (ref 8.4–10.5)
Chloride: 85 mEq/L — ABNORMAL LOW (ref 96–112)
Creatinine, Ser: 3.6 mg/dL — ABNORMAL HIGH (ref 0.50–1.10)
GFR calc Af Amer: 14 mL/min — ABNORMAL LOW (ref >90)
GFR calc non Af Amer: 12 mL/min — ABNORMAL LOW (ref >90)
Glucose, Bld: 357 mg/dL — ABNORMAL HIGH (ref 70–99)
Phosphorus: 2.2 mg/dL — ABNORMAL LOW (ref 2.3–4.6)
Potassium: 4.2 mEq/L (ref 3.7–5.3)
Sodium: 127 mEq/L — ABNORMAL LOW (ref 137–147)

## 2013-12-14 LAB — CBC
HCT: 28.4 % — ABNORMAL LOW (ref 36.0–46.0)
Hemoglobin: 9.3 g/dL — ABNORMAL LOW (ref 12.0–15.0)
MCH: 30.2 pg (ref 26.0–34.0)
MCHC: 32.7 g/dL (ref 30.0–36.0)
MCV: 92.2 fL (ref 78.0–100.0)
Platelets: 521 10*3/uL — ABNORMAL HIGH (ref 150–400)
RBC: 3.08 MIL/uL — ABNORMAL LOW (ref 3.87–5.11)
RDW: 16.4 % — ABNORMAL HIGH (ref 11.5–15.5)
WBC: 13.3 10*3/uL — ABNORMAL HIGH (ref 4.0–10.5)

## 2013-12-14 LAB — GLUCOSE, CAPILLARY
GLUCOSE-CAPILLARY: 248 mg/dL — AB (ref 70–99)
GLUCOSE-CAPILLARY: 299 mg/dL — AB (ref 70–99)

## 2013-12-14 MED ORDER — LIDOCAINE HCL (PF) 1 % IJ SOLN: 5.0000 mL | INTRAMUSCULAR | Status: DC | PRN

## 2013-12-14 MED ORDER — SODIUM CHLORIDE 0.9 % IV SOLN: 100.0000 mL | INTRAVENOUS | Status: DC | PRN

## 2013-12-14 MED ORDER — ACETAMINOPHEN 325 MG PO TABS
ORAL_TABLET | ORAL | Status: AC
  Filled 2013-12-14: qty 2

## 2013-12-14 MED ORDER — LIDOCAINE HCL (PF) 1 % IJ SOLN
INTRAMUSCULAR | Status: AC
  Filled 2013-12-14: qty 10

## 2013-12-14 MED ORDER — LIDOCAINE-PRILOCAINE 2.5-2.5 % EX CREA: 1.0000 "application " | TOPICAL_CREAM | CUTANEOUS | Status: DC | PRN

## 2013-12-14 MED ORDER — INSULIN GLARGINE 100 UNIT/ML ~~LOC~~ SOLN
20.0000 [IU] | Freq: Every day | SUBCUTANEOUS | Status: DC
Start: 2013-12-14 — End: 2013-12-19
  Administered 2013-12-14 – 2013-12-18 (×5): 20 [IU] via SUBCUTANEOUS
  Filled 2013-12-14 (×6): qty 0.2

## 2013-12-14 MED ORDER — PENTAFLUOROPROP-TETRAFLUOROETH EX AERO: 1.0000 "application " | INHALATION_SPRAY | CUTANEOUS | Status: DC | PRN

## 2013-12-14 MED ORDER — AMLODIPINE BESYLATE 10 MG PO TABS
10.0000 mg | ORAL_TABLET | Freq: Every day | ORAL | Status: DC
  Administered 2013-12-15 – 2013-12-19 (×5): 10 mg via ORAL
  Filled 2013-12-14 (×5): qty 1

## 2013-12-14 MED ORDER — ALTEPLASE 2 MG IJ SOLR: 2.0000 mg | Freq: Once | INTRAMUSCULAR | Status: DC | PRN

## 2013-12-14 MED ORDER — DEXAMETHASONE SODIUM PHOSPHATE 4 MG/ML IJ SOLN
4.0000 mg | Freq: Two times a day (BID) | INTRAMUSCULAR | Status: DC
  Administered 2013-12-14 – 2013-12-18 (×9): 4 mg via INTRAVENOUS
  Filled 2013-12-14 (×10): qty 1

## 2013-12-14 MED ORDER — NEPRO/CARBSTEADY PO LIQD: 237.0000 mL | ORAL | Status: DC | PRN

## 2013-12-14 NOTE — Progress Notes (Signed)
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 Palliative care has seen the patient and started on Decadron to reduce the cord edema as well as better pain control.we'll change the Decadron to 4 mg IV every 12 hours. 2. Chronic back pain- patient has chronic back pain from the progressive pathologic fracture of the T10-11 vertebrae, continue baclofen 10 mg every 12 hours when necessary,fentanyl patch has been increased to 25 g every 72 hours, Neurontin 100 mg 3 times weekly. Started back on Cymbalta, Topamax discontinued. 3. Pleural effusion- CT chest showed large right pleural effusion, thoracentesis was performed and cytology reveals mixed inflammatory cells, reactive appearing mesothelial cells.pulmonary is following, and recommended repeat thoracentesis with a repeat cytology. Will obtain clear-cut thoracentesis and again send the fluid for cytology. 4. Hypertension- patient's blood pressure is elevated, despite taking 2 medications including amlodipine and metoprolol. Will increase the dose of amlodipine to 10 mg by mouth daily and continue metoprolol 25 mg by mouth twice a day. 5. Suicidal ideation- patient complained of suicidal ideation today, one is to one sitter, and psych consult obtained.Will follow psych recommendations. 6. End-stage renal disease- patient is currently on hemodialysis, and is refusing to undergo further hemodialysis. Discussed with Dr. Jonnie Ellison,  .[name] is at this time he would not who says that he would not put this patient for hemodialysis at this time. 7. Hyperthyroidism- Continue Tapazole, the dose has now increased to  20 mg po TID 8. Diabetes Mellitus-blood glucose is now elevated due to Decadron, sliding-scale insulin with novolog and alsochange the Lantus to 20 units subcutaneous daily.    Code Status: Full code Family Communication: discussed with patient's son, Dr. Rhea Ellison. As per palliative care patient does not have capacity to make decisions, so we'll have to follow patient's son's wishes for further procedures. Disposition Plan:  SNF   Consultants:  Nephrology  Neurology   Palliative care  Procedures:  Thoracentesis  Antibiotics:  None  HPI/Subjective: 73 year old female with past medical history of end-stage renal disease and intermittent compliance issues with dialysis who earlier this year underwent a decompressive thoracic laminectomy for unclear spinal process which was causing cord compression and edema presented to the emergency room on 10/30 with complaints of worsening shortness of breath times several days plus progression and loss of her right sided extremities. MRI thoracic spine demonstrated further spinal cord compression of vertebral destruction and seen by neurosurgery who felt that she would not be an operative candidate. On chest x-ray patient also noted to have large left-sided pleural effusion not present last month on x-ray. Initially when she was admitted, patient's mentation in question. Patient admitted to the hospitalist service. Nephrology consulted Seen by pulmonary and interventional radiology and underwent thoracentesis of the right lung with 1.2 L of fluid removed. Patient has also been followed by palliative care but deciding on goals of care has been complicated due to family issues and patient's wavering mentation which may or may not be chronic.  Patient seen and examined, no new complaints.  CT abdomen pelvis obtained which does not show any abnormal masses.   Objective: Filed Vitals:   12/14/13 1120  BP: 169/71  Pulse: 81  Temp: 98.2 F (36.8  C)  Resp: 18    Intake/Output Summary (Last 24 hours) at 12/14/13 1211 Last data filed at 12/14/13 0900  Gross per 24 hour  Intake    400 ml  Output      0 ml  Net    400 ml   Filed Weights   12/10/13 2112 12/11/13 2027 12/12/13 2038  Weight: 68.099 kg (150 lb 2.1 oz) 69.3 kg (152 lb 12.5 oz) 67.6 kg (149 lb 0.5 oz)    Exam:  Physical Exam: Head: Normocephalic, atraumatic.  Eyes: No signs of jaundice, EOMI Lungs: Normal respiratory effort. B/L Clear to auscultation, no crackles or wheezes.  Heart: Regular RR. S1 and S2 normal  Abdomen: BS normoactive. Soft, Nondistended, non-tender.  Extremities: No pretibial edema, no erythema   Data Reviewed: Basic Metabolic Panel:  Recent Labs Lab 12/10/13 0719 12/12/13 1000  NA 131* 132*  K 5.0 4.6  CL 90* 90*  CO2 27 25  GLUCOSE 156* 181*  BUN 30* 25*  CREATININE 4.45* 4.06*  CALCIUM 8.6 9.0  PHOS 5.2*  --    Liver Function Tests:  Recent Labs Lab 12/10/13 0719 12/12/13 1000  AST  --  29  ALT  --  28  ALKPHOS  --  160*  BILITOT  --  0.4  PROT  --  7.2  ALBUMIN 2.0* 2.1*   No results for input(s): LIPASE, AMYLASE in the last 168 hours. No results for input(s): AMMONIA in the last 168 hours. CBC:  Recent Labs Lab 12/10/13 0719 12/12/13 1000  WBC 10.6* 11.5*  HGB 9.3* 9.3*  HCT 29.2* 29.7*  MCV 94.2 94.9  PLT 460* 459*   Cardiac Enzymes: No results for input(s): CKTOTAL, CKMB, CKMBINDEX, TROPONINI in the last 168 hours. BNP (last 3 results) No results for input(s): PROBNP in the last 8760 hours. CBG:  Recent Labs Lab 12/13/13 1205 12/13/13 1738 12/13/13 2103 12/14/13 0748 12/14/13 1139  GLUCAP 334* 356* 386* 248* 299*    Recent Results (from the past 240 hour(s))  MRSA PCR Screening     Status: None   Collection Time: 12/07/13  1:03 AM  Result Value Ref Range Status   MRSA by PCR NEGATIVE NEGATIVE Final    Comment:        The GeneXpert MRSA Assay (FDA approved for NASAL specimens only),  is one component of a comprehensive MRSA colonization surveillance program. It is not intended to diagnose MRSA infection nor to guide or monitor treatment for MRSA infections.  Culture, blood (routine x 2)     Status: None   Collection Time: 12/07/13 10:52 AM  Result Value Ref Range Status   Specimen Description BLOOD RIGHT HAND  Final   Special Requests BOTTLES DRAWN AEROBIC ONLY 1CC  Final   Culture  Setup Time   Final    12/07/2013 20:10 Performed at [facility]    Culture   Final    NO GROWTH 5 DAYS Performed at [facility]    Report Status 12/13/2013 FINAL  Final  Culture, blood (routine x 2)     Status: None   Collection Time: 12/07/13 11:00 AM  Result Value Ref Range Status   Specimen Description BLOOD RIGHT HAND  Final   Special Requests BOTTLES DRAWN AEROBIC ONLY 5CC  Final   Culture  Setup Time   Final    12/07/2013 20:10 Performed at [place]   Final    NO GROWTH 5 DAYS Performed at [facility]    Report Status 12/13/2013 FINAL  Final  Fungus Culture with Smear     Status: None (Preliminary result)   Collection Time: 12/09/13  1:28 PM  Result Value Ref Range Status   Specimen Description FLUID RIGHT PLEURAL  Final   Special Requests NONE  Final   Fungal Smear   Final    NO YEAST OR FUNGAL ELEMENTS SEEN Performed at [facility]    Culture   Final    CULTURE IN PROGRESS FOR FOUR WEEKS Performed at [facility]    Report Status PENDING  Incomplete  Body fluid culture     Status: None   Collection Time: 12/09/13  1:28 PM  Result Value Ref Range Status   Specimen Description FLUID RIGHT PLEURAL  Final   Special Requests NONE  Final   Gram Stain   Final    RARE WBC PRESENT, PREDOMINANTLY MONONUCLEAR NO ORGANISMS SEEN Performed at [facility]    Culture   Final    NO GROWTH 3 DAYS Performed at [facility]    Report Status 12/13/2013 FINAL  Final  AFB culture with  smear     Status: None (Preliminary result)   Collection Time: 12/09/13  1:28 PM  Result Value Ref Range Status   Specimen Description FLUID RIGHT PLEURAL  Final   Special Requests NONE  Final   Acid Fast Smear   Final    NO ACID FAST BACILLI SEEN Performed at [facility]    Culture   Final    CULTURE WILL BE EXAMINED FOR 6 WEEKS BEFORE ISSUING A FINAL REPORT Performed at [facility]    Report Status PENDING  Incomplete     Studies: Dg Chest 1 View  12/14/2013   CLINICAL DATA:  Post right-sided thoracentesis.  EXAM: CHEST - 1 VIEW  COMPARISON:  12/10/2013; 12/09/2013  FINDINGS: Grossly unchanged enlarged cardiac silhouette and mediastinal contours post median sternotomy and CABG. Interval reduction / near resolution of right-sided pleural effusion post thoracentesis. No pneumothorax. Overall improved aeration of lungs with persistent bibasilar opacities, right greater than left, likely atelectasis. No new focal airspace opacities. Grossly unchanged bones. Radiopaque material is seen within the gastric fundus.  IMPRESSION: 1. Interval reduction/near resolution of right-sided effusion post thoracentesis. No pneumothorax. 2. Overall improved aeration of the lungs suggests resolving edema and atelectasis. 3. Residual right basilar opacities, favored to represent atelectasis.   Electronically Signed   By: Sandi Mariscal M.D.   On: 12/14/2013 11:15   Ct Abdomen Pelvis W Contrast  12/13/2013   ADDENDUM REPORT: 12/13/2013 08:57  ADDENDUM: Regarding the area of luminal narrowing and mild wall thickening in the sigmoid colon, this does not appear overtly concerning for neoplasm on today's study although it is somewhat unusual for this same area to be narrow on the most recent comparison is well. This region appear normal on the CT scan before. It may represent area of evolving stenosis although there is no substantial diverticular change in the region. As warranted in this individual,  sigmoidoscopy may prove helpful to further evaluate.   Electronically Signed   By: Misty Stanley M.D.   On: 12/13/2013 08:57   12/13/2013   CLINICAL DATA:  Subsequent encounter for thoracic spine mass.  EXAM: CT ABDOMEN AND PELVIS WITH CONTRAST  TECHNIQUE: Multidetector CT imaging of the abdomen and pelvis was performed using the standard protocol following bolus administration of intravenous contrast.  CONTRAST:  145mL OMNIPAQUE IOHEXOL 300  MG/ML  SOLN  COMPARISON:  12/07/1948 year 07/10/2013.  FINDINGS: Lower chest: The right pleural effusion has decreased in the interval and is now moderate in size. There is bibasilar collapse/ consolidation in the dependent lower lobes.  Hepatobiliary: No focal abnormality within the liver parenchyma. Layering tiny stones are seen in the gallbladder lumen. No gallbladder wall thickening or pericholecystic fluid. No intrahepatic or extrahepatic biliary dilation.  Pancreas: No focal mass lesion. No dilatation of the main duct. No intraparenchymal cyst. No peripancreatic edema.  Spleen: No splenomegaly. No focal mass lesion.  Adrenals/Urinary Tract: No adrenal nodule or mass. There is no evidence for hydronephrosis or renal mass lesion. The urinary bladder is distended.  Stomach/Bowel: Stomach is nondistended. No gastric wall thickening. No evidence of outlet obstruction. Duodenum is normally positioned as is the ligament of Treitz. No small bowel wall thickening. No small bowel dilatation. Terminal ileum is normal. The appendix is normal.  On image 65 of series 2, there is a focal area of narrowing in the sigmoid colon. While this may be related to peristalsis, there are no other similar appearing areas in the colon and there was similar luminal narrowing with wall thickening at this location on the study from 12/07/2013 although the appearance was less prominent on the study from 07/10/2013.  Vascular/Lymphatic: Atherosclerotic calcification is noted in the wall of the abdominal  aorta without aneurysm. There is no gastrohepatic or hepatoduodenal ligament lymphadenopathy. Portal vein is patent. No retroperitoneal lymphadenopathy. There is no pelvic sidewall lymphadenopathy.  Reproductive: Uterus is surgically absent. There is no adnexal mass.  Other: Diffuse body wall edema noted. No intraperitoneal free fluid.  Musculoskeletal: The abnormal appearance of T9, T10, and T11 is again noted with near complete light cysts of the T10 vertebral body and most of the T11 vertebral body. Comparing to 10/30 1/15, the bony anatomy in this region is relatively stable in appearance however there has been an interval increase in abnormal soft tissue seen anterior to the T10 and T11 vertebral bodies.  IMPRESSION: Relatively stable appearance of the T9, T10, and T11 vertebral bodies although there is been interval increase in the abnormal paraspinal soft tissue at this level. The increase is evident, but very subtle on axial imaging, and best appreciated on the sagittal reconstructions. This region was biopsied during laminectomy on 07/16/2013 and pathology report at that time found no evidence of malignancy.  Interval decrease in the moderate size right pleural effusion with associated bibasilar collapse/ consolidation.  Cholelithiasis.  Diffuse body wall edema.  Electronically Signed: By: Misty Stanley M.D. On: 12/13/2013 08:33   US Thoracentesis Asp Pleural Space W/img Guide  12/14/2013   INDICATION: Symptomatic RIGHT sided pleural effusion  EXAM: US THORACENTESIS ASP PLEURAL SPACE W/IMG GUIDE  COMPARISON:  Previous thoracentesis  MEDICATIONS: 10 cc 1% lidocaine  COMPLICATIONS: None immediate  TECHNIQUE: Informed written consent was obtained from the patient after a discussion of the risks, benefits and alternatives to treatment. A timeout was performed prior to the initiation of the procedure.  Initial ultrasound scanning demonstrates a right pleural effusion. The lower chest was prepped and draped in  the usual sterile fashion. 1% lidocaine was used for local anesthesia.  Under direct ultrasound guidance, a 19 gauge, 7-cm, Yueh catheter was introduced. An ultrasound image was saved for documentation purposes. The thoracentesis was performed. The catheter was removed and a dressing was applied. The patient tolerated the procedure well without immediate post procedural complication. The patient was escorted to have an upright  chest radiograph.  FINDINGS: A total of approximately 1 liters of yellow fluid was removed. Requested samples were sent to the laboratory.  IMPRESSION: Successful ultrasound-guided R sided thoracentesis yielding 1 liters of pleural fluid.  Read by:  Lavonia Drafts [facility]   Electronically Signed   By: Sandi Mariscal M.D.   On: 12/14/2013 11:01    Scheduled Meds: . [START ON 12/15/2013] amLODipine  10 mg Oral Daily  . aspirin EC  81 mg Oral Daily  . atorvastatin  40 mg Oral QHS  . collagenase   Topical Daily  . darbepoetin (ARANESP) injection - DIALYSIS  50 mcg Intravenous Q Tue-HD  . dexamethasone  10 mg Intravenous Q24H  . docusate sodium  100 mg Oral BID  . DULoxetine  30 mg Oral Daily  . feeding supplement (NEPRO CARB STEADY)  237 mL Oral Q1500  . feeding supplement (RESOURCE BREEZE)  1 Container Oral BID BM  . fentaNYL  25 mcg Transdermal Q72H  . gabapentin  100 mg Oral Once per day on Mon Wed Fri  . insulin aspart  0-15 Units Subcutaneous TID WC  . insulin glargine  10 Units Subcutaneous QHS  . lanthanum  1,000 mg Oral TID WC  . lidocaine (PF)      . methimazole  20 mg Oral TID  . metoprolol tartrate  25 mg Oral BID  . multivitamin  1 tablet Oral QHS  . pantoprazole  40 mg Oral Daily  . polyethylene glycol  17 g Oral Daily  . ranolazine  500 mg Oral BID  . senna-docusate  2 tablet Oral QHS  . sodium chloride  3 mL Intravenous Q12H  . 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 If 7PM-7AM, please contact night-coverage at [email or web address], password [facility] 12/14/2013, 12:11 PM  LOS: 8 days

## 2013-12-14 NOTE — Progress Notes (Signed)
Subjective:   NO complaints, had thoracentesis today  Objective Filed Vitals:   12/14/13 1037 12/14/13 1042 12/14/13 1047 12/14/13 1120  BP: 145/55 152/55 139/53 169/71  Pulse:    81  Temp:    98.2 F (36.8 C)  TempSrc:    Oral  Resp:    18  Height:      Weight:      SpO2: 96% 95% 97% 100%   Physical Exam General: fully alert, much better than earlier this week Heart: RRR Lungs: CTA, unlabored Abdomen: ost, nontender +BS  Extremities: no edema. L foot in boot Dialysis Access:  L AVF +b/t   HD: TTS [place] 3h 74min 67.5kg 2/2.25 [place] LUA AVF Heparin 4500 Aranesp 50 ug q thurs, no other meds  Assessment: 1. 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 Blood cultures no growth. Head CT no acute abnormalitity. ID has seen.   2. Large Right Pleural Effusion-  S/p repeat thoracentesis today 2. ESRD - TTS [place], was refusing HD but now agrees 3. Anemia - hgb 9.3- cont aranesp 50 q week. No Fe.  4. Secondary hyperparathyroidism - phos 5.2- no binders, poor appetite now, watch Phos. Ca+ 8.6 5. HTN/volume - 161/79 cont amlodipine and metop. At dry wt 6. Nutrition - Supplements. Multivit. Renal diet  Plan -  HD today  [name] MD (pgr) [phone]    ([phone] 12/14/2013, 12:34 PM  Basic Metabolic Panel:  Recent Labs Lab 12/10/13 0719 12/12/13 1000  NA 131* 132*  K 5.0 4.6  CL 90* 90*  CO2 27 25  GLUCOSE 156* 181*  BUN 30* 25*  CREATININE 4.45* 4.06*  CALCIUM 8.6 9.0  PHOS 5.2*  --    Liver Function Tests:  Recent Labs Lab 12/10/13 0719 12/12/13 1000  AST  --  29  ALT  --  28  ALKPHOS  --  160*  BILITOT  --  0.4  PROT  --  7.2  ALBUMIN 2.0* 2.1*   No results for input(s): LIPASE, AMYLASE in the last 168 hours. CBC:  Recent Labs Lab 12/10/13 0719 12/12/13 1000  WBC 10.6* 11.5*  HGB 9.3* 9.3*  HCT 29.2* 29.7*  MCV 94.2 94.9  PLT 460* 459*    Medications:    . [START ON 12/15/2013] amLODipine  10 mg Oral Daily  . aspirin EC  81 mg Oral Daily  . atorvastatin  40 mg Oral QHS  . collagenase   Topical Daily  . darbepoetin (ARANESP) injection - DIALYSIS  50 mcg Intravenous Q Tue-HD  . dexamethasone  4 mg Intravenous Q12H  . docusate sodium  100 mg Oral BID  . DULoxetine  30 mg Oral Daily  . feeding supplement (NEPRO CARB STEADY)  237 mL Oral Q1500  . feeding supplement (RESOURCE BREEZE)  1 Container Oral BID BM  . fentaNYL  25 mcg Transdermal Q72H  . gabapentin  100 mg Oral Once per day on Mon Wed Fri  . insulin aspart  0-15 Units Subcutaneous TID WC  . insulin glargine  20 Units Subcutaneous QHS  . lanthanum  1,000 mg Oral TID WC  . lidocaine (PF)      . methimazole  20 mg Oral TID  . metoprolol tartrate  25 mg Oral BID  . multivitamin  1 tablet Oral QHS  . pantoprazole  40 mg Oral Daily  . polyethylene glycol  17 g Oral Daily  . ranolazine  500 mg Oral BID  . senna-docusate  2 tablet Oral QHS  . sodium chloride  3 mL Intravenous Q12H  . traZODone  25 mg Oral QHS

## 2013-12-14 NOTE — Procedures (Signed)
   US guided Rt thora  1 liter yellow fluid  Sent for labs per MD  Pt tolerated well cxr pending

## 2013-12-15 DIAGNOSIS — J90 Pleural effusion, not elsewhere classified: Secondary | ICD-10-CM | POA: Insufficient documentation

## 2013-12-15 LAB — CBC
HCT: 31 % — ABNORMAL LOW (ref 36.0–46.0)
HEMOGLOBIN: 9.7 g/dL — AB (ref 12.0–15.0)
MCH: 29.3 pg (ref 26.0–34.0)
MCHC: 31.3 g/dL (ref 30.0–36.0)
MCV: 93.7 fL (ref 78.0–100.0)
PLATELETS: 454 10*3/uL — AB (ref 150–400)
RBC: 3.31 MIL/uL — AB (ref 3.87–5.11)
RDW: 16.5 % — ABNORMAL HIGH (ref 11.5–15.5)
WBC: 12.6 10*3/uL — ABNORMAL HIGH (ref 4.0–10.5)

## 2013-12-15 LAB — BASIC METABOLIC PANEL
Anion gap: 13 (ref 5–15)
BUN: 23 mg/dL (ref 6–23)
CHLORIDE: 93 meq/L — AB (ref 96–112)
CO2: 27 mEq/L (ref 19–32)
CREATININE: 2.5 mg/dL — AB (ref 0.50–1.10)
Calcium: 9 mg/dL (ref 8.4–10.5)
GFR calc non Af Amer: 18 mL/min — ABNORMAL LOW (ref >90)
GFR, EST AFRICAN AMERICAN: 21 mL/min — AB (ref >90)
Glucose, Bld: 162 mg/dL — ABNORMAL HIGH (ref 70–99)
POTASSIUM: 4.3 meq/L (ref 3.7–5.3)
Sodium: 133 mEq/L — ABNORMAL LOW (ref 137–147)

## 2013-12-15 LAB — GLUCOSE, CAPILLARY
GLUCOSE-CAPILLARY: 276 mg/dL — AB (ref 70–99)
GLUCOSE-CAPILLARY: 397 mg/dL — AB (ref 70–99)
Glucose-Capillary: 157 mg/dL — ABNORMAL HIGH (ref 70–99)

## 2013-12-15 MED ORDER — HYDROMORPHONE HCL 2 MG PO TABS
2.0000 mg | ORAL_TABLET | Freq: Once | ORAL | Status: AC
  Administered 2013-12-15: 2 mg via ORAL

## 2013-12-15 NOTE — Progress Notes (Signed)
Subjective:  Tolerated HD yesterday/ back pain seemed controlled on meds  Objective Vital signs in last 24 hours: Filed Vitals:   12/14/13 1859 12/14/13 2019 12/15/13 0500 12/15/13 0956  BP: 154/93 157/69 181/85 178/74  Pulse: 96 100 89 81  Temp: 98.2 F (36.8 C) 98.7 F (37.1 C) 98.2 F (36.8 C) 97.7 F (36.5 C)  TempSrc: Oral Oral Oral Oral  Resp: 18 18 18 18   Height:      Weight:  67.6 kg (149 lb 0.5 oz)    SpO2: 100% 100% 94% 100%   Weight change:   Physical Exam: General: Alert, NAD/ appears Appropriate/ sitter in room , no suicidal ideations Heart: RRR, no rub, mur or gallop Lungs: CTA bilat Abdomen: BS pos, distended, nontender Extremities: L foot in boot lower leg trace edema/ no R pedal edema  Dialysis Access: L UArm Avf pos. bruit   HD: TTS [place] 3h 48min 67.5kg 2/2.25 [place] LUA AVF Heparin 4500 Aranesp 50 ug q thurs, no other meds  Problem/Plan: 1. 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 Blood cultures no growth. Head CT no acute abnormalitity. ID has seen.On  IV Steroids Dexamethasone 4mg  bid 2. Large Right Pleural Effusion- S/p repeat thoracentesis yesterday 2. ESRD - TTS [place], Had refused HD and  HD now agrees( "was feeling bad in  Lots of pain, not ready to stop HD") 3. Anemia - hgb 9.7- aranesp 50 q Tues HD week. No Fe.  4. Secondary hyperparathyroidism - phos 5.2>2.2  Will dc Fosrenal, poor appetite , watch Phos. Ca+ 10.4 corrected, No vit d  5. HTN/volume - 178/74 cont amlodipine and metop. At dry wt/ CXR yest showing "resolving edema and Pl effusion near resolution" 6. Nutrition - Supplements. Multivit. Renal diet  Ernest Haber, PA-C [facility] Beeper [phone] 12/15/2013,11:18 AM  LOS: 9 days   Pt seen, examined and agree w A/P as above. We still don't know what caused the bony destruction in the vertebral column for this patient.  [name]  MD pager [phone]    cell [phone] 12/15/2013, 12:53 PM    Labs: Basic Metabolic Panel:  Recent Labs Lab 12/10/13 0719 12/12/13 1000 12/14/13 1420 12/15/13 0725  NA 131* 132* 127* 133*  K 5.0 4.6 4.2 4.3  CL 90* 90* 85* 93*  CO2 27 25 26 27   GLUCOSE 156* 181* 357* 162*  BUN 30* 25* 33* 23  CREATININE 4.45* 4.06* 3.60* 2.50*  CALCIUM 8.6 9.0 9.1 9.0  PHOS 5.2*  --  2.2*  --    Liver Function Tests:  Recent Labs Lab 12/10/13 0719 12/12/13 1000 12/14/13 1420  AST  --  29  --   ALT  --  28  --   ALKPHOS  --  160*  --   BILITOT  --  0.4  --   PROT  --  7.2  --   ALBUMIN 2.0* 2.1* 2.2*  CBC:  Recent Labs Lab 12/10/13 0719 12/12/13 1000 12/14/13 1419 12/15/13 0725  WBC 10.6* 11.5* 13.3* 12.6*  HGB 9.3* 9.3* 9.3* 9.7*  HCT 29.2* 29.7* 28.4* 31.0*  MCV 94.2 94.9 92.2 93.7  PLT 460* 459* 521* 454*  CBG:  Recent Labs Lab 12/13/13 1738 12/13/13 2103 12/14/13 0748 12/14/13 1139 12/15/13 0748  GLUCAP 356* 386* 248* 299* 157*    Studies/Results: Dg Chest 1 View  12/14/2013   CLINICAL DATA:  Post right-sided thoracentesis.  EXAM: CHEST -  1 VIEW  COMPARISON:  12/10/2013; 12/09/2013  FINDINGS: Grossly unchanged enlarged cardiac silhouette and mediastinal contours post median sternotomy and CABG. Interval reduction / near resolution of right-sided pleural effusion post thoracentesis. No pneumothorax. Overall improved aeration of lungs with persistent bibasilar opacities, right greater than left, likely atelectasis. No new focal airspace opacities. Grossly unchanged bones. Radiopaque material is seen within the gastric fundus.  IMPRESSION: 1. Interval reduction/near resolution of right-sided effusion post thoracentesis. No pneumothorax. 2. Overall improved aeration of the lungs suggests resolving edema and atelectasis. 3. Residual right basilar opacities, favored to represent atelectasis.   Electronically Signed   By: Sandi Mariscal M.D.   On: 12/14/2013 11:15   US  Thoracentesis Asp Pleural Space W/img Guide  12/14/2013   INDICATION: Symptomatic RIGHT sided pleural effusion  EXAM: US THORACENTESIS ASP PLEURAL SPACE W/IMG GUIDE  COMPARISON:  Previous thoracentesis  MEDICATIONS: 10 cc 1% lidocaine  COMPLICATIONS: None immediate  TECHNIQUE: Informed written consent was obtained from the patient after a discussion of the risks, benefits and alternatives to treatment. A timeout was performed prior to the initiation of the procedure.  Initial ultrasound scanning demonstrates a right pleural effusion. The lower chest was prepped and draped in the usual sterile fashion. 1% lidocaine was used for local anesthesia.  Under direct ultrasound guidance, a 19 gauge, 7-cm, Yueh catheter was introduced. An ultrasound image was saved for documentation purposes. The thoracentesis was performed. The catheter was removed and a dressing was applied. The patient tolerated the procedure well without immediate post procedural complication. The patient was escorted to have an upright chest radiograph.  FINDINGS: A total of approximately 1 liters of yellow fluid was removed. Requested samples were sent to the laboratory.  IMPRESSION: Successful ultrasound-guided R sided thoracentesis yielding 1 liters of pleural fluid.  Read by:  Lavonia Drafts [facility]   Electronically Signed   By: Sandi Mariscal M.D.   On: 12/14/2013 11:01   Medications:   . amLODipine  10 mg Oral Daily  . aspirin EC  81 mg Oral Daily  . atorvastatin  40 mg Oral QHS  . collagenase   Topical Daily  . darbepoetin (ARANESP) injection - DIALYSIS  50 mcg Intravenous Q Tue-HD  . dexamethasone  4 mg Intravenous Q12H  . docusate sodium  100 mg Oral BID  . DULoxetine  30 mg Oral Daily  . feeding supplement (NEPRO CARB STEADY)  237 mL Oral Q1500  . feeding supplement (RESOURCE BREEZE)  1 Container Oral BID BM  . fentaNYL  25 mcg Transdermal Q72H  . gabapentin  100 mg Oral Once per day on Mon Wed Fri  . insulin aspart  0-15 Units  Subcutaneous TID WC  . insulin glargine  20 Units Subcutaneous QHS  . lanthanum  1,000 mg Oral TID WC  . methimazole  20 mg Oral TID  . metoprolol tartrate  25 mg Oral BID  . multivitamin  1 tablet Oral QHS  . pantoprazole  40 mg Oral Daily  . polyethylene glycol  17 g Oral Daily  . ranolazine  500 mg Oral BID  . senna-docusate  2 tablet Oral QHS  . sodium chloride  3 mL Intravenous Q12H  . traZODone  25 mg Oral QHS

## 2013-12-15 NOTE — Progress Notes (Signed)
[facility] PROGRESS NOTE  Holly Ellison ION:[id number] DOB: 1940/11/09 DOA: 12/06/2013 PCP: [name], MD  Assessment/Plan: 1. 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 Palliative care has seen the patient and started on Decadron to reduce the cord edema as well as better pain control.we'll change the Decadron to 4 mg IV every 12 hours.  2. Chronic back pain- patient has chronic back pain from the progressive pathologic fracture of the T10-11 vertebrae, continue baclofen 10 mg every 12 hours when necessary,fentanyl patch has been increased to 25 g every 72 hours, Neurontin 100 mg 3 times weekly. Started back on Cymbalta, Topamax discontinued.  3. Pleural effusion- CT chest showed large right pleural effusion, thoracentesis was performed and cytology reveals mixed inflammatory cells, reactive appearing mesothelial cells.pulmonary is following, and recommended repeat thoracentesis with a repeat cytology. Will obtain repeat  thoracentesis and again send the fluid for cytology.  4. Hypertension- patient's blood pressure is elevated, despite taking 2 medications including amlodipine and metoprolol. Will increase the dose of amlodipine to 10 mg by mouth daily and continue metoprolol 25 mg by mouth twice a day.  5. Suicidal ideation- patient complained of suicidal ideation today, one is to one sitter, and psych consult obtained.Will follow psych recommendations.  6. End-stage renal disease- patient is currently on hemodialysis, she had earlier refused hemodialysis.Later after Psych and palliative care deemed her incompetent, she is now back on hemodialysis.  7. Hyperthyroidism- Continue Tapazole, the dose has now increased to 20 mg po TID  8. Diabetes Mellitus-blood glucose  is now elevated due to Decadron, sliding-scale insulin with novolog and alsochange the Lantus to 20 units subcutaneous daily.  9. ? Underlying malignancy- All the workup for malignancy so far is negative including the cytology from the pleural fluid,CT chest, abdomen and pelvis were obtained which did not show any tumor or metastasis. I called and discussed with oncology on call Dr. Alen Blew, and he recommends to obtain his SPEP and UPEP to rule out underlying myeloma. Will order SPEP and UPEP today.    Code Status: Full code Family Communication: discussed with patient's son, Dr. [name]. As per palliative care patient does not have capacity to make decisions, so we'll have to follow patient's son's wishes for further procedures. Disposition Plan:  SNF   Consultants:  Nephrology  Neurology   Palliative care  Procedures:  Thoracentesis  Antibiotics:  None  HPI/Subjective: 73 year old female with past medical history of end-stage renal disease and intermittent compliance issues with dialysis who earlier this year underwent a decompressive thoracic laminectomy for unclear spinal process which was causing cord compression and edema presented to the emergency room on 10/30 with complaints of worsening shortness of breath times several days plus progression and loss of her right sided extremities. MRI thoracic spine demonstrated further spinal cord compression of vertebral destruction and seen by neurosurgery who felt that she would not be an operative candidate. On chest x-ray patient also noted to have large left-sided pleural effusion not present last month on x-ray. Initially when she was admitted, patient's mentation in question. Patient admitted to the hospitalist service. Nephrology consulted Seen by pulmonary and interventional radiology and underwent thoracentesis of the right lung with 1.2 L of fluid removed. Patient has also been followed by palliative care but deciding on goals  of care has  been complicated due to family issues and patient's wavering mentation which may or may not be chronic.  Patient seen and examined, no new complaints.  CT abdomen pelvis obtained which does not show any abnormal masses.   Objective: Filed Vitals:   12/15/13 0956  BP: 178/74  Pulse: 81  Temp: 97.7 F (36.5 C)  Resp: 18    Intake/Output Summary (Last 24 hours) at 12/15/13 1144 Last data filed at 12/14/13 1757  Gross per 24 hour  Intake    120 ml  Output   1998 ml  Net  -1878 ml   Filed Weights   12/14/13 1400 12/14/13 1757 12/14/13 2019  Weight: 68.7 kg (151 lb 7.3 oz) 66.7 kg (147 lb 0.8 oz) 67.6 kg (149 lb 0.5 oz)    Exam:  Physical Exam: Head: Normocephalic, atraumatic.  Eyes: No signs of jaundice, EOMI Lungs: Normal respiratory effort. B/L Clear to auscultation, no crackles or wheezes.  Heart: Regular RR. S1 and S2 normal  Abdomen: BS normoactive. Soft, Nondistended, non-tender.  Extremities: No pretibial edema, no erythema   Data Reviewed: Basic Metabolic Panel:  Recent Labs Lab 12/10/13 0719 12/12/13 1000 12/14/13 1420 12/15/13 0725  NA 131* 132* 127* 133*  K 5.0 4.6 4.2 4.3  CL 90* 90* 85* 93*  CO2 27 25 26 27   GLUCOSE 156* 181* 357* 162*  BUN 30* 25* 33* 23  CREATININE 4.45* 4.06* 3.60* 2.50*  CALCIUM 8.6 9.0 9.1 9.0  PHOS 5.2*  --  2.2*  --    Liver Function Tests:  Recent Labs Lab 12/10/13 0719 12/12/13 1000 12/14/13 1420  AST  --  29  --   ALT  --  28  --   ALKPHOS  --  160*  --   BILITOT  --  0.4  --   PROT  --  7.2  --   ALBUMIN 2.0* 2.1* 2.2*   No results for input(s): LIPASE, AMYLASE in the last 168 hours. No results for input(s): AMMONIA in the last 168 hours. CBC:  Recent Labs Lab 12/10/13 0719 12/12/13 1000 12/14/13 1419 12/15/13 0725  WBC 10.6* 11.5* 13.3* 12.6*  HGB 9.3* 9.3* 9.3* 9.7*  HCT 29.2* 29.7* 28.4* 31.0*  MCV 94.2 94.9 92.2 93.7  PLT 460* 459* 521* 454*   Cardiac Enzymes: No results for  input(s): CKTOTAL, CKMB, CKMBINDEX, TROPONINI in the last 168 hours. BNP (last 3 results) No results for input(s): PROBNP in the last 8760 hours. CBG:  Recent Labs Lab 12/13/13 1738 12/13/13 2103 12/14/13 0748 12/14/13 1139 12/15/13 0748  GLUCAP 356* 386* 248* 299* 157*    Recent Results (from the past 240 hour(s))  MRSA PCR Screening     Status: None   Collection Time: 12/07/13  1:03 AM  Result Value Ref Range Status   MRSA by PCR NEGATIVE NEGATIVE Final    Comment:        The GeneXpert MRSA Assay (FDA approved for NASAL specimens only), is one component of a comprehensive MRSA colonization surveillance program. It is not intended to diagnose MRSA infection nor to guide or monitor treatment for MRSA infections.  Culture, blood (routine x 2)     Status: None   Collection Time: 12/07/13 10:52 AM  Result Value Ref Range Status   Specimen Description BLOOD RIGHT HAND  Final   Special Requests BOTTLES DRAWN AEROBIC ONLY [place]  Final   Culture  Setup Time   Final    12/07/2013 20:10 Performed at [facility]  Culture   Final    NO GROWTH 5 DAYS Performed at [facility]    Report Status 12/13/2013 FINAL  Final  Culture, blood (routine x 2)     Status: None   Collection Time: 12/07/13 11:00 AM  Result Value Ref Range Status   Specimen Description BLOOD RIGHT HAND  Final   Special Requests BOTTLES DRAWN AEROBIC ONLY 5CC  Final   Culture  Setup Time   Final    12/07/2013 20:10 Performed at [facility]    Culture   Final    NO GROWTH 5 DAYS Performed at [facility]    Report Status 12/13/2013 FINAL  Final  Fungus Culture with Smear     Status: None (Preliminary result)   Collection Time: 12/09/13  1:28 PM  Result Value Ref Range Status   Specimen Description FLUID RIGHT PLEURAL  Final   Special Requests NONE  Final   Fungal Smear   Final    NO YEAST OR FUNGAL ELEMENTS SEEN Performed at [facility]    Culture   Final     CULTURE IN PROGRESS FOR FOUR WEEKS Performed at [facility]    Report Status PENDING  Incomplete  Body fluid culture     Status: None   Collection Time: 12/09/13  1:28 PM  Result Value Ref Range Status   Specimen Description FLUID RIGHT PLEURAL  Final   Special Requests NONE  Final   Gram Stain   Final    RARE WBC PRESENT, PREDOMINANTLY MONONUCLEAR NO ORGANISMS SEEN Performed at [facility]    Culture   Final    NO GROWTH 3 DAYS Performed at [facility]    Report Status 12/13/2013 FINAL  Final  AFB culture with smear     Status: None (Preliminary result)   Collection Time: 12/09/13  1:28 PM  Result Value Ref Range Status   Specimen Description FLUID RIGHT PLEURAL  Final   Special Requests NONE  Final   Acid Fast Smear   Final    NO ACID FAST BACILLI SEEN Performed at [facility]    Culture   Final    CULTURE WILL BE EXAMINED FOR 6 WEEKS BEFORE ISSUING A FINAL REPORT Performed at [facility]    Report Status PENDING  Incomplete     Studies: Dg Chest 1 View  12/14/2013   CLINICAL DATA:  Post right-sided thoracentesis.  EXAM: CHEST - 1 VIEW  COMPARISON:  12/10/2013; 12/09/2013  FINDINGS: Grossly unchanged enlarged cardiac silhouette and mediastinal contours post median sternotomy and CABG. Interval reduction / near resolution of right-sided pleural effusion post thoracentesis. No pneumothorax. Overall improved aeration of lungs with persistent bibasilar opacities, right greater than left, likely atelectasis. No new focal airspace opacities. Grossly unchanged bones. Radiopaque material is seen within the gastric fundus.  IMPRESSION: 1. Interval reduction/near resolution of right-sided effusion post thoracentesis. No pneumothorax. 2. Overall improved aeration of the lungs suggests resolving edema and atelectasis. 3. Residual right basilar opacities, favored to represent atelectasis.   Electronically Signed   By: Sandi Mariscal M.D.   On:  12/14/2013 11:15   US Thoracentesis Asp Pleural Space W/img Guide  12/14/2013   INDICATION: Symptomatic RIGHT sided pleural effusion  EXAM: US THORACENTESIS ASP PLEURAL SPACE W/IMG GUIDE  COMPARISON:  Previous thoracentesis  MEDICATIONS: 10 cc 1% lidocaine  COMPLICATIONS: None immediate  TECHNIQUE: Informed written consent was obtained from the patient after a discussion of the risks,  benefits and alternatives to treatment. A timeout was performed prior to the initiation of the procedure.  Initial ultrasound scanning demonstrates a right pleural effusion. The lower chest was prepped and draped in the usual sterile fashion. 1% lidocaine was used for local anesthesia.  Under direct ultrasound guidance, a 19 gauge, 7-cm, Yueh catheter was introduced. An ultrasound image was saved for documentation purposes. The thoracentesis was performed. The catheter was removed and a dressing was applied. The patient tolerated the procedure well without immediate post procedural complication. The patient was escorted to have an upright chest radiograph.  FINDINGS: A total of approximately 1 liters of yellow fluid was removed. Requested samples were sent to the laboratory.  IMPRESSION: Successful ultrasound-guided R sided thoracentesis yielding 1 liters of pleural fluid.  Read by:  Lavonia Drafts [facility]   Electronically Signed   By: Sandi Mariscal M.D.   On: 12/14/2013 11:01    Scheduled Meds: . amLODipine  10 mg Oral Daily  . aspirin EC  81 mg Oral Daily  . atorvastatin  40 mg Oral QHS  . collagenase   Topical Daily  . darbepoetin (ARANESP) injection - DIALYSIS  50 mcg Intravenous Q Tue-HD  . dexamethasone  4 mg Intravenous Q12H  . docusate sodium  100 mg Oral BID  . DULoxetine  30 mg Oral Daily  . feeding supplement (NEPRO CARB STEADY)  237 mL Oral Q1500  . feeding supplement (RESOURCE BREEZE)  1 Container Oral BID BM  . fentaNYL  25 mcg Transdermal Q72H  . gabapentin  100 mg Oral Once per day on Mon Wed Fri  .  insulin aspart  0-15 Units Subcutaneous TID WC  . insulin glargine  20 Units Subcutaneous QHS  . lanthanum  1,000 mg Oral TID WC  . methimazole  20 mg Oral TID  . metoprolol tartrate  25 mg Oral BID  . multivitamin  1 tablet Oral QHS  . pantoprazole  40 mg Oral Daily  . polyethylene glycol  17 g Oral Daily  . ranolazine  500 mg Oral BID  . senna-docusate  2 tablet Oral QHS  . sodium chloride  3 mL Intravenous Q12H  . 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 If 7PM-7AM, please contact night-coverage at [email or web address], password [email or web address] 12/15/2013, 11:44 AM  LOS: 9 days

## 2013-12-16 DIAGNOSIS — M5384 Other specified dorsopathies, thoracic region: Secondary | ICD-10-CM

## 2013-12-16 DIAGNOSIS — M549 Dorsalgia, unspecified: Secondary | ICD-10-CM | POA: Insufficient documentation

## 2013-12-16 DIAGNOSIS — Z139 Encounter for screening, unspecified: Secondary | ICD-10-CM

## 2013-12-16 DIAGNOSIS — G959 Disease of spinal cord, unspecified: Secondary | ICD-10-CM

## 2013-12-16 LAB — GLUCOSE, CAPILLARY
GLUCOSE-CAPILLARY: 248 mg/dL — AB (ref 70–99)
Glucose-Capillary: 370 mg/dL — ABNORMAL HIGH (ref 70–99)
Glucose-Capillary: 134 mg/dL — ABNORMAL HIGH (ref 70–99)
Glucose-Capillary: 270 mg/dL — ABNORMAL HIGH (ref 70–99)
Glucose-Capillary: 288 mg/dL — ABNORMAL HIGH (ref 70–99)

## 2013-12-16 LAB — BASIC METABOLIC PANEL
ANION GAP: 16 — AB (ref 5–15)
BUN: 37 mg/dL — ABNORMAL HIGH (ref 6–23)
CO2: 26 mEq/L (ref 19–32)
Calcium: 9.1 mg/dL (ref 8.4–10.5)
Chloride: 90 mEq/L — ABNORMAL LOW (ref 96–112)
Creatinine, Ser: 3.22 mg/dL — ABNORMAL HIGH (ref 0.50–1.10)
GFR calc non Af Amer: 13 mL/min — ABNORMAL LOW (ref >90)
GFR, EST AFRICAN AMERICAN: 16 mL/min — AB (ref >90)
Glucose, Bld: 144 mg/dL — ABNORMAL HIGH (ref 70–99)
POTASSIUM: 4.5 meq/L (ref 3.7–5.3)
Sodium: 132 mEq/L — ABNORMAL LOW (ref 137–147)

## 2013-12-16 NOTE — Progress Notes (Addendum)
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 Palliative care has seen the patient and started on Decadron to reduce the cord edema as well as better pain control, has been evaluated by infectious disease as well to rule out infectious process contributing to her compression fracture, will obtain CT-guided biopsy of the compression fracture site, and will send for culture, and tuberculosis by PCR as per ID recommendation.  2. Chronic back pain- patient has chronic back pain from the progressive pathologic fracture of the T10-11 vertebrae, continue baclofen 10 mg every 12 hours when necessary,fentanyl patch has been increased to 25 g every 72 hours, Neurontin 100 mg 3 times weekly. Started back on Cymbalta, Topamax discontinued.  3.    Pleural effusion- CT chest showed large right pleural effusion, thoracentesis was performed and cytology reveals                 mixed inflammatory cells, reactive appearing mesothelial cells  4.   Hypertension- patient's blood pressure is elevated, despite taking 2 medications including amlodipine and metoprolol.      Will increase the dose of amlodipine to 10 mg by mouth daily and continue metoprolol 25 mg by mouth twice a day.  5.   Suicidal ideation- patient complained of suicidal ideation today, one is to one sitter, and psych consult obtained.Will             follow psych recommendations.psychiatrically were consulted 12/16/2013.  6.   End-stage renal disease- patient is currently on hemodialysis, she had earlier refused hemodialysis.Later after Psych         and palliative care deemed her incompetent, she is now  back on hemodialysis.  7.     Hyperthyroidism- Continue Tapazole, the dose has now increased to 20 mg po TID  8.      Diabetes Mellitus-blood glucose is now elevated due to Decadron, sliding-scale insulin with novolog and alsochange the Lantus to 20 units subcutaneous daily.  9.  ? Underlying malignancy- All the workup for malignancy so far is negative including the cytology from the pleural fluid,CT chest, abdomen and pelvis were obtained which did not show any tumor or metastasis. Dr Darrick Meigs called and discussed with oncology on call Dr. Alen Blew, and he recommends to obtain his SPEP and UPEP to rule out underlying myeloma.    Code Status: Full code Family Communication: discussed with patient's son, Dr. [name]. As per palliative care patient does not have capacity to make decisions, so we'll have to follow patient's son's wishes for further procedures. Disposition Plan:  SNF   Consultants:  Nephrology  Neurology   Palliative care  Procedures:  Thoracentesis  Antibiotics:  None  HPI/Subjective: 73 year old female with past medical history of end-stage renal disease and intermittent compliance issues with dialysis who earlier this year underwent a decompressive thoracic laminectomy for unclear spinal process which was causing cord compression and edema presented to the emergency room on 10/30 with complaints of worsening shortness of breath times several days plus progression and loss of her right sided extremities. MRI thoracic spine demonstrated further spinal cord compression of vertebral destruction and seen by neurosurgery who felt that she would not be an operative candidate. On chest x-ray patient  also noted to have large left-sided pleural effusion not present last month on x-ray. Initially when she was admitted, patient's mentation in question. Patient admitted to the hospitalist service. Nephrology consulted Seen by pulmonary and interventional radiology and underwent  thoracentesis of the right lung with 1.2 L of fluid removed. Patient has also been followed by palliative care but deciding on goals of care has been complicated due to family issues and patient's wavering mentation which may or may not be chronic.  Patient seen and examined, no new complaints.  CT abdomen pelvis obtained which does not show any abnormal masses.   Objective: Filed Vitals:   12/16/13 1030  BP: 152/70  Pulse: 68  Temp: 98.7 F (37.1 C)  Resp: 16    Intake/Output Summary (Last 24 hours) at 12/16/13 1426 Last data filed at 12/16/13 1030  Gross per 24 hour  Intake    423 ml  Output    850 ml  Net   -427 ml   Filed Weights   12/14/13 1757 12/14/13 2019 12/15/13 2037  Weight: 66.7 kg (147 lb 0.8 oz) 67.6 kg (149 lb 0.5 oz) 67.4 kg (148 lb 9.4 oz)    Exam:  Physical Exam: Head: Normocephalic, atraumatic.  Eyes: No signs of jaundice, EOMI Lungs: Normal respiratory effort. B/L Clear to auscultation, no crackles or wheezes.  Heart: Regular RR. S1 and S2 normal  Abdomen: BS normoactive. Soft, Nondistended, non-tender.  Extremities: No pretibial edema, no erythema   Data Reviewed: Basic Metabolic Panel:  Recent Labs Lab 12/10/13 0719 12/12/13 1000 12/14/13 1420 12/15/13 0725 12/16/13 0615  NA 131* 132* 127* 133* 132*  K 5.0 4.6 4.2 4.3 4.5  CL 90* 90* 85* 93* 90*  CO2 27 25 26 27 26   GLUCOSE 156* 181* 357* 162* 144*  BUN 30* 25* 33* 23 37*  CREATININE 4.45* 4.06* 3.60* 2.50* 3.22*  CALCIUM 8.6 9.0 9.1 9.0 9.1  PHOS 5.2*  --  2.2*  --   --    Liver Function Tests:  Recent Labs Lab 12/10/13 0719 12/12/13 1000 12/14/13 1420  AST  --  29  --   ALT  --  28  --   ALKPHOS  --  160*  --   BILITOT  --  0.4  --   PROT  --  7.2  --   ALBUMIN 2.0* 2.1* 2.2*   No results for input(s): LIPASE, AMYLASE in the last 168 hours. No results for input(s): AMMONIA in the last 168 hours. CBC:  Recent Labs Lab 12/10/13 0719 12/12/13 1000 12/14/13 1419  12/15/13 0725  WBC 10.6* 11.5* 13.3* 12.6*  HGB 9.3* 9.3* 9.3* 9.7*  HCT 29.2* 29.7* 28.4* 31.0*  MCV 94.2 94.9 92.2 93.7  PLT 460* 459* 521* 454*   Cardiac Enzymes: No results for input(s): CKTOTAL, CKMB, CKMBINDEX, TROPONINI in the last 168 hours. BNP (last 3 results) No results for input(s): PROBNP in the last 8760 hours. CBG:  Recent Labs Lab 12/15/13 0748 12/15/13 1152 12/15/13 1722 12/16/13 0805 12/16/13 1142  GLUCAP 157* 276* 397* 134* 248*    Recent Results (from the past 240 hour(s))  MRSA PCR Screening     Status: None   Collection Time: 12/07/13  1:03 AM  Result Value Ref Range Status   MRSA by PCR NEGATIVE NEGATIVE Final    Comment:        The GeneXpert MRSA Assay (FDA approved for NASAL specimens only), is one component of a comprehensive MRSA colonization surveillance program.  It is not intended to diagnose MRSA infection nor to guide or monitor treatment for MRSA infections.  Culture, blood (routine x 2)     Status: None   Collection Time: 12/07/13 10:52 AM  Result Value Ref Range Status   Specimen Description BLOOD RIGHT HAND  Final   Special Requests BOTTLES DRAWN AEROBIC ONLY 1CC  Final   Culture  Setup Time   Final    12/07/2013 20:10 Performed at [facility]    Culture   Final    NO GROWTH 5 DAYS Performed at [facility]    Report Status 12/13/2013 FINAL  Final  Culture, blood (routine x 2)     Status: None   Collection Time: 12/07/13 11:00 AM  Result Value Ref Range Status   Specimen Description BLOOD RIGHT HAND  Final   Special Requests BOTTLES DRAWN AEROBIC ONLY 5CC  Final   Culture  Setup Time   Final    12/07/2013 20:10 Performed at [facility]    Culture   Final    NO GROWTH 5 DAYS Performed at [facility]    Report Status 12/13/2013 FINAL  Final  Fungus Culture with Smear     Status: None (Preliminary result)   Collection Time: 12/09/13  1:28 PM  Result Value Ref Range Status    Specimen Description FLUID RIGHT PLEURAL  Final   Special Requests NONE  Final   Fungal Smear   Final    NO YEAST OR FUNGAL ELEMENTS SEEN Performed at [facility]    Culture   Final    CULTURE IN PROGRESS FOR FOUR WEEKS Performed at [facility]    Report Status PENDING  Incomplete  Body fluid culture     Status: None   Collection Time: 12/09/13  1:28 PM  Result Value Ref Range Status   Specimen Description FLUID RIGHT PLEURAL  Final   Special Requests NONE  Final   Gram Stain   Final    RARE WBC PRESENT, PREDOMINANTLY MONONUCLEAR NO ORGANISMS SEEN Performed at [facility]    Culture   Final    NO GROWTH 3 DAYS Performed at [facility]    Report Status 12/13/2013 FINAL  Final  AFB culture with smear     Status: None (Preliminary result)   Collection Time: 12/09/13  1:28 PM  Result Value Ref Range Status   Specimen Description FLUID RIGHT PLEURAL  Final   Special Requests NONE  Final   Acid Fast Smear   Final    NO ACID FAST BACILLI SEEN Performed at [facility]    Culture   Final    CULTURE WILL BE EXAMINED FOR 6 WEEKS BEFORE ISSUING A FINAL REPORT Performed at [facility]    Report Status PENDING  Incomplete     Studies: No results found.  Scheduled Meds: . amLODipine  10 mg Oral Daily  . aspirin EC  81 mg Oral Daily  . atorvastatin  40 mg Oral QHS  . collagenase   Topical Daily  . darbepoetin (ARANESP) injection - DIALYSIS  50 mcg Intravenous Q Tue-HD  . dexamethasone  4 mg Intravenous Q12H  . docusate sodium  100 mg Oral BID  . DULoxetine  30 mg Oral Daily  . feeding supplement (NEPRO CARB STEADY)  237 mL Oral Q1500  . feeding supplement (RESOURCE BREEZE)  1 Container Oral BID BM  . fentaNYL  25 mcg Transdermal Q72H  . gabapentin  100 mg Oral Once per day on Mon Wed Fri  . insulin aspart  0-15 Units Subcutaneous TID WC  . insulin glargine  20 Units Subcutaneous QHS  . methimazole  20 mg Oral TID  .  metoprolol tartrate  25 mg Oral BID  . multivitamin  1 tablet Oral QHS  . pantoprazole  40 mg Oral Daily  . polyethylene glycol  17 g Oral Daily  . ranolazine  500 mg Oral BID  . senna-docusate  2 tablet Oral QHS  . sodium chloride  3 mL Intravenous Q12H  . 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   Diabetes mellitus   Hypertension   COPD (chronic obstructive pulmonary disease)   Hyperthyroidism   ESRD on dialysis   Paraparesis   Compression of spinal cord with myelopathy   Pleural effusion   Altered mental state   Pain   Acute delirium   Palliative care encounter   CN (constipation)   Acute respiratory failure with hypoxia   Somnolence   Type 2 diabetes mellitus with diabetic chronic kidney disease   Pressure ulcer   Normocytic anemia   Poor dentition   Unintentional weight loss   Pleural effusion on left    Time spent: 25 minWaldron Labs, Holly Ellison  [facility] Pager [phone]. If 7PM-7AM, please contact night-coverage at [email or web address], password [facility] 12/16/2013, 2:26 PM  LOS: 10 days

## 2013-12-16 NOTE — Progress Notes (Signed)
Subjective:  No changes with back spasms intermittently   Objective Vital signs in last 24 hours: Filed Vitals:   12/15/13 0956 12/15/13 1723 12/15/13 2037 12/16/13 0603  BP: 178/74 187/76 189/83 184/81  Pulse: 81 93 88 84  Temp: 97.7 F (36.5 C) 98.6 F (37 C) 97.5 F (36.4 C) 97.8 F (36.6 C)  TempSrc: Oral Oral Oral Oral  Resp: 18 18 18 18   Height:      Weight:   67.4 kg (148 lb 9.4 oz)   SpO2: 100% 98% 97% 100%   Weight change: -1.3 kg (-2 lb 13.9 oz)  Physical Exam: General: Alert, NAD/  Appropriate/ sitter in room , again this am  no suicidal ideations Heart: RRR, no rub, mur or gallop Lungs: CTA bilat Abdomen: BS pos, distended but  nontender Extremities: L foot in boot lower leg trace edema/ no R pedal edema Dialysis Access: L UArm Avf pos. bruit   HD: [facility] [place] 3h 50min 67.5kg 2/2.25 [place] [facility] AVF Heparin 4500 Aranesp 50 ug q thurs, no other meds  Problem/Plan: 1. 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 Blood cultures no growth. Head CT no acute abnormalitity. ID has seen.On IV Steroids Dexamethasone 4mg  bid for chord edema/  Was living at [facility] prior to admit for  past several months with Harrel Lemon lift in OP [facility], HD unit 2. Large Right Pleural Effusion- Pulmonary following, S/p thoracentesis .cytology had  revealed mixed inflammatory cells, reactive appearing mesothelial cells 3 ESRD - TTS [place], Had refused HD and now agrees to continue ( "was feeling bad in Lots of pain, not ready to stop HD") 4. Anemia - hgb 9.7- aranesp 50 q Tues HD week. No Fe.  5. Secondary hyperparathyroidism - phos 5.2>2.2 Will dc Fosrenal, poor appetite , watch Phos. Ca+ 10.4 corrected, No vit d  7. HTN/volume - Bp up 184/81 ON  amlodipine and metop. 25mg  bid/ Yesterday Amlodipine incr 5mg  to 10mg /At dry wt/ CXR Sat showing "resolving edema and Pl effusion near resolution" HD in am  Uf 3 to 4 l  challenge 8 Nutrition - Supplements. Multivit. Renal diet    Ernest Haber, PA-C [facility] Beeper [phone] 12/16/2013,9:24 AM  LOS: 10 days  I have seen and examined this patient and agree with plan per Ernest Haber.  Pt denies pain to me and says she thinks her back issues are related to muscle spasms.  She is very emotional.  Will plan HD in AM in dialysis chair to make sure she can handle outpt HD.Marland Kitchen Kajuan Guyton T,MD 12/16/2013 11:08 AM Labs: Basic Metabolic Panel:  Recent Labs Lab 12/10/13 0719  12/14/13 1420 12/15/13 0725 12/16/13 0615  NA 131*  < > 127* 133* 132*  K 5.0  < > 4.2 4.3 4.5  CL 90*  < > 85* 93* 90*  CO2 27  < > 26 27 26   GLUCOSE 156*  < > 357* 162* 144*  BUN 30*  < > 33* 23 37*  CREATININE 4.45*  < > 3.60* 2.50* 3.22*  CALCIUM 8.6  < > 9.1 9.0 9.1  PHOS 5.2*  --  2.2*  --   --   < > = values in this interval not displayed. Liver Function Tests:  Recent Labs Lab 12/10/13 0719 12/12/13 1000 12/14/13 1420  AST  --  29  --   ALT  --  28  --   ALKPHOS  --  160*  --   BILITOT  --  0.4  --   PROT  --  7.2  --   ALBUMIN 2.0* 2.1* 2.2*  CBC:  Recent Labs Lab 12/10/13 0719 12/12/13 1000 12/14/13 1419 12/15/13 0725  WBC 10.6* 11.5* 13.3* 12.6*  HGB 9.3* 9.3* 9.3* 9.7*  HCT 29.2* 29.7* 28.4* 31.0*  MCV 94.2 94.9 92.2 93.7  PLT 460* 459* 521* 454*   Cardiac Enzymes: No results for input(s): CKTOTAL, CKMB, CKMBINDEX, TROPONINI in the last 168 hours. CBG:  Recent Labs Lab 12/14/13 1139 12/15/13 0748 12/15/13 1152 12/15/13 1722 12/16/13 0805  GLUCAP 299* 157* 276* 397* 134*    Studies/Results: Dg Chest 1 View  12/14/2013   CLINICAL DATA:  Post right-sided thoracentesis.  EXAM: CHEST - 1 VIEW  COMPARISON:  12/10/2013; 12/09/2013  FINDINGS: Grossly unchanged enlarged cardiac silhouette and mediastinal contours post median sternotomy and CABG. Interval reduction / near resolution of right-sided pleural effusion post  thoracentesis. No pneumothorax. Overall improved aeration of lungs with persistent bibasilar opacities, right greater than left, likely atelectasis. No new focal airspace opacities. Grossly unchanged bones. Radiopaque material is seen within the gastric fundus.  IMPRESSION: 1. Interval reduction/near resolution of right-sided effusion post thoracentesis. No pneumothorax. 2. Overall improved aeration of the lungs suggests resolving edema and atelectasis. 3. Residual right basilar opacities, favored to represent atelectasis.   Electronically Signed   By: Sandi Mariscal M.D.   On: 12/14/2013 11:15   US Thoracentesis Asp Pleural Space W/img Guide  12/14/2013   INDICATION: Symptomatic RIGHT sided pleural effusion  EXAM: US THORACENTESIS ASP PLEURAL SPACE W/IMG GUIDE  COMPARISON:  Previous thoracentesis  MEDICATIONS: 10 cc 1% lidocaine  COMPLICATIONS: None immediate  TECHNIQUE: Informed written consent was obtained from the patient after a discussion of the risks, benefits and alternatives to treatment. A timeout was performed prior to the initiation of the procedure.  Initial ultrasound scanning demonstrates a right pleural effusion. The lower chest was prepped and draped in the usual sterile fashion. 1% lidocaine was used for local anesthesia.  Under direct ultrasound guidance, a 19 gauge, 7-cm, Yueh catheter was introduced. An ultrasound image was saved for documentation purposes. The thoracentesis was performed. The catheter was removed and a dressing was applied. The patient tolerated the procedure well without immediate post procedural complication. The patient was escorted to have an upright chest radiograph.  FINDINGS: A total of approximately 1 liters of yellow fluid was removed. Requested samples were sent to the laboratory.  IMPRESSION: Successful ultrasound-guided R sided thoracentesis yielding 1 liters of pleural fluid.  Read by:  Lavonia Drafts [facility]   Electronically Signed   By: Sandi Mariscal M.D.   On:  12/14/2013 11:01   Medications:   . amLODipine  10 mg Oral Daily  . aspirin EC  81 mg Oral Daily  . atorvastatin  40 mg Oral QHS  . collagenase   Topical Daily  . darbepoetin (ARANESP) injection - DIALYSIS  50 mcg Intravenous Q Tue-HD  . dexamethasone  4 mg Intravenous Q12H  . docusate sodium  100 mg Oral BID  . DULoxetine  30 mg Oral Daily  . feeding supplement (NEPRO CARB STEADY)  237 mL Oral Q1500  . feeding supplement (RESOURCE BREEZE)  1 Container Oral BID BM  . fentaNYL  25 mcg Transdermal Q72H  . gabapentin  100 mg Oral Once per day on Mon Wed Fri  . insulin aspart  0-15 Units Subcutaneous TID WC  . insulin glargine  20 Units Subcutaneous QHS  . methimazole  20 mg Oral TID  . metoprolol tartrate  25 mg Oral BID  . multivitamin  1 tablet Oral QHS  . pantoprazole  40 mg Oral Daily  . polyethylene glycol  17 g Oral Daily  . ranolazine  500 mg Oral BID  . senna-docusate  2 tablet Oral QHS  . sodium chloride  3 mL Intravenous Q12H  . traZODone  25 mg Oral QHS

## 2013-12-16 NOTE — Progress Notes (Signed)
[facility]    Subjective: No new complaints   Antibiotics:  Anti-infectives    None      Medications: Scheduled Meds: . amLODipine  10 mg Oral Daily  . aspirin EC  81 mg Oral Daily  . atorvastatin  40 mg Oral QHS  . collagenase   Topical Daily  . darbepoetin (ARANESP) injection - DIALYSIS  50 mcg Intravenous Q Tue-HD  . dexamethasone  4 mg Intravenous Q12H  . docusate sodium  100 mg Oral BID  . DULoxetine  30 mg Oral Daily  . feeding supplement (NEPRO CARB STEADY)  237 mL Oral Q1500  . feeding supplement (RESOURCE BREEZE)  1 Container Oral BID BM  . fentaNYL  25 mcg Transdermal Q72H  . gabapentin  100 mg Oral Once per day on Mon Wed Fri  . insulin aspart  0-15 Units Subcutaneous TID WC  . insulin glargine  20 Units Subcutaneous QHS  . methimazole  20 mg Oral TID  . metoprolol tartrate  25 mg Oral BID  . multivitamin  1 tablet Oral QHS  . pantoprazole  40 mg Oral Daily  . polyethylene glycol  17 g Oral Daily  . ranolazine  500 mg Oral BID  . senna-docusate  2 tablet Oral QHS  . sodium chloride  3 mL Intravenous Q12H  . traZODone  25 mg Oral QHS   Continuous Infusions:  PRN Meds:.acetaminophen **OR** acetaminophen, albuterol, ALPRAZolam, baclofen, dextrose, hydrALAZINE, HYDROmorphone, [DISCONTINUED] ondansetron **OR** ondansetron (ZOFRAN) IV, ondansetron    Objective: Weight change: -2 lb 13.9 oz (-1.3 kg)  Intake/Output Summary (Last 24 hours) at 12/16/13 1324 Last data filed at 12/16/13 1030  Gross per 24 hour  Intake    423 ml  Output    850 ml  Net   -427 ml   Blood pressure 152/70, pulse 68, temperature 98.7 F (37.1 C), temperature source Oral, resp. rate 16, height 5' 7"  (1.702 m), weight 148 lb 9.4 oz (67.4 kg), SpO2 100 %. Temp:  [97.5 F (36.4 C)-98.7 F (37.1 C)] 98.7 F (37.1 C) (11/09 1030) Pulse Rate:  [68-93] 68 (11/09 1030) Resp:  [16-18] 16 (11/09 1030) BP: (152-189)/(70-83) 152/70 mmHg (11/09 1030) SpO2:   [97 %-100 %] 100 % (11/09 1030) Weight:  [148 lb 9.4 oz (67.4 kg)] 148 lb 9.4 oz (67.4 kg) (11/08 2037)  Physical Exam: General: Alert and awake, oriented x3, not in any acute distress. HEENT:EOMI CVS regular rate, normal r,  no murmur rubs or gallops Chest: clear to auscultation bilaterally, no wheezing, rales or rhonchi Abdomen: soft nontender, nondistended, normal bowel sounds, Extremities: no  clubbing or edema noted bilaterally Neuro: nonfocal  CBC:  Recent Labs Lab 12/10/13 0719 12/12/13 1000 12/14/13 1419 12/15/13 0725  HGB 9.3* 9.3* 9.3* 9.7*  HCT 29.2* 29.7* 28.4* 31.0*  PLT 460* 459* 521* 454*     BMET  Recent Labs  12/15/13 0725 12/16/13 0615  NA 133* 132*  K 4.3 4.5  CL 93* 90*  CO2 27 26  GLUCOSE 162* 144*  BUN 23 37*  CREATININE 2.50* 3.22*  CALCIUM 9.0 9.1     Liver Panel   Recent Labs  12/14/13 1420  ALBUMIN 2.2*       Sedimentation Rate No results for input(s): ESRSEDRATE in the last 72 hours. C-Reactive Protein No results for input(s): CRP in the last 72 hours.  Micro Results: Recent Results (from the past 720 hour(s))  MRSA PCR Screening  Status: None   Collection Time: 12/07/13  1:03 AM  Result Value Ref Range Status   MRSA by PCR NEGATIVE NEGATIVE Final    Comment:        The GeneXpert MRSA Assay (FDA approved for NASAL specimens only), is one component of a comprehensive MRSA colonization surveillance program. It is not intended to diagnose MRSA infection nor to guide or monitor treatment for MRSA infections.  Culture, blood (routine x 2)     Status: None   Collection Time: 12/07/13 10:52 AM  Result Value Ref Range Status   Specimen Description BLOOD RIGHT HAND  Final   Special Requests BOTTLES DRAWN AEROBIC ONLY 1CC  Final   Culture  Setup Time   Final    12/07/2013 20:10 Performed at [facility]    Culture   Final    NO GROWTH 5 DAYS Performed at [facility]    Report Status  12/13/2013 FINAL  Final  Culture, blood (routine x 2)     Status: None   Collection Time: 12/07/13 11:00 AM  Result Value Ref Range Status   Specimen Description BLOOD RIGHT HAND  Final   Special Requests BOTTLES DRAWN AEROBIC ONLY 5CC  Final   Culture  Setup Time   Final    12/07/2013 20:10 Performed at [facility]    Culture   Final    NO GROWTH 5 DAYS Performed at [facility]    Report Status 12/13/2013 FINAL  Final  Fungus Culture with Smear     Status: None (Preliminary result)   Collection Time: 12/09/13  1:28 PM  Result Value Ref Range Status   Specimen Description FLUID RIGHT PLEURAL  Final   Special Requests NONE  Final   Fungal Smear   Final    NO YEAST OR FUNGAL ELEMENTS SEEN Performed at [facility]    Culture   Final    CULTURE IN PROGRESS FOR FOUR WEEKS Performed at [facility]    Report Status PENDING  Incomplete  Body fluid culture     Status: None   Collection Time: 12/09/13  1:28 PM  Result Value Ref Range Status   Specimen Description FLUID RIGHT PLEURAL  Final   Special Requests NONE  Final   Gram Stain   Final    RARE WBC PRESENT, PREDOMINANTLY MONONUCLEAR NO ORGANISMS SEEN Performed at [facility]    Culture   Final    NO GROWTH 3 DAYS Performed at [facility]    Report Status 12/13/2013 FINAL  Final  AFB culture with smear     Status: None (Preliminary result)   Collection Time: 12/09/13  1:28 PM  Result Value Ref Range Status   Specimen Description FLUID RIGHT PLEURAL  Final   Special Requests NONE  Final   Acid Fast Smear   Final    NO ACID FAST BACILLI SEEN Performed at [facility]    Culture   Final    CULTURE WILL BE EXAMINED FOR 6 WEEKS BEFORE ISSUING A FINAL REPORT Performed at [facility]    Report Status PENDING  Incomplete    Studies/Results: No results found.    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 female with ESRD on HD with prior MSSA bacteremia found toa vhe lytic t11 vertebral lesion sp operative decompression and biopsy with The biopsy revealed only dense fibrocartilaginous tissue without evidence for malignancy or infection. The biopsy was not sent for culture. She was afebrile and blood cultures were negative at that time. Progressive destructive process involving the T9, T10, and T11vertebra. Extension of abnormal soft tissue into the spinal canal at that level which probably and compresses the spinal cord. Secondary extensive soft tissue reaction in the adjacent soft tissues.. Large right pleural effusion that is exudative  #1 T spine disease: After reviewing her case I think this is MUCH MORE likely to represent bacterial infection of the vertebral spine likely from prior MSSA bacteremia   I DONT Think this is likely TB  --I would favor GETTING IR GUIDED ASPIRATE OF HER THORACIC VERTEBRA WITH SPECIMEN SENT FOR BACTERIAL CULTURES, AFB STAIN AND CULTURE AND FUNGAL STAIN AND CULTURE --check ESR and CRP  #2 PLEURAL EFFUSION  NOT likely to be TB MTB PCR and adenosine deaminase are pending  #3 Screening: she needs HIV and Hep C screening      LOS: 10 days   Alcide Evener 12/16/2013, 1:24 PM

## 2013-12-16 NOTE — Clinical Social Work Note (Signed)
CSW received call from [facility]- informed that patient is still unable to DC due to inability to sit up to receive dialysis.  CSW will continue to follow.  Domenica Reamer, [facility] Social Worker [phone]

## 2013-12-16 NOTE — Consult Note (Signed)
Chief Complaint: Chief Complaint  Patient presents with  . Altered Mental Status  . Respiratory Distress  Back pain Paraparesis   Referring Physician(s): TRH/ID  History of Present Illness: Holly Ellison is a 73 y.o. female  Hx laminectomy T 11 07/2013 Bx then was neg for malignancy Hx MSSA bacteremia Continued back pain Abnormal CT 12/14/2013 reveals abnormal Thoracic 10/17/09 areas with sign of inflammatory soft tissue and fx T10 TRH and ID has asked IR for consultation for possible bx/aspiration at this area for diagnosis  for appropriate treatment Also noted thoracentesis x 2 in last week: cyto: reactive cells; Cx neg Dr Estanislado Pandy has reviewed imaging and chart I have seen and examined pt I have spoken to son via phone Now scheduled for Thoracic 10 biopsy/aspiration 11/10   Past Medical History  Diagnosis Date  . Chronic kidney disease     just found out? Placed graft in [facility]-Dr. Tarri Glenn in [facility].  started dialysis in 06/2011  . Arthritis     Osteoarthritis  . Neuropathy in diabetes   . Insomnia   . Asthma   . CAD (coronary artery disease) 2012    7 stents-First PCI was in [place].  The last time she had a procedure was  a stent last year, thena  CABG was undertaken in October [facility])  . GERD (gastroesophageal reflux disease)   . Diabetes mellitus     Type II  . COPD (chronic obstructive pulmonary disease)   . Hypertension   . Peripheral vascular disease     endarterectomy  . Anemia   . CHF (congestive heart failure)   . Coronary artery dilation   . Myocardial infarction 2013    x 3  . 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 H/O Bell's palsy   . Frequent headaches   . Orthostasis     Past Surgical History  Procedure Laterality Date  . Abdominal hysterectomy    . Coronary artery bypass graft  2012    7 stents  . Cea      Left  . Carpal tunnel release    . Av fistula placement    . Grave's disease      ? unclea rif ca  or not   . Laminectomy N/A 07/16/2013    Procedure: THORACIC LAMINECTOMY FOR TUMOR;  Surgeon: Erline Levine, MD;  Location: [facility];  Service: Neurosurgery;  Laterality: N/A;    Allergies: Morphine sulfate; Opium; Ciprofloxacin hcl; Lexapro; Procainamide hcl; and Robaxin  Medications: Prior to Admission medications   Medication Sig Start Date End Date Taking? Authorizing Provider  acetaminophen (TYLENOL) 325 MG tablet Take 325-650 mg by mouth See admin instructions. Take 1 tablet (325 mg) every 4 hours as needed for mild pain, take 2 tablets (650 mg) every 4 hours as needed for severe pain   Yes Historical Provider, MD  aspirin EC 81 MG EC tablet Take 1 tablet (81 mg total) by mouth daily. 08/14/13  Yes Ivan Anchors Love, PA-C  atorvastatin (LIPITOR) 40 MG tablet Take 40 mg by mouth at bedtime.    Yes Historical Provider, MD  b complex-vitamin c-folic acid (NEPHRO-VITE) 0.8 MG TABS tablet Take 1 tablet by mouth daily.   Yes Historical Provider, MD  baclofen (LIORESAL) 10 MG tablet Take 10 mg by mouth every 12 (twelve) hours as needed for muscle spasms.   Yes Historical Provider, MD  bisacodyl (DULCOLAX) 10 MG suppository Place 1 suppository (10 mg  total) rectally daily at 6 (six) AM. 08/14/13  Yes Ivan Anchors Love, PA-C  calcium carbonate (TUMS EX) 750 MG chewable tablet Chew 1 tablet (750 mg total) by mouth 3 (three) times daily as needed for heartburn. 08/14/13  Yes Ivan Anchors Love, PA-C  cholecalciferol (VITAMIN D) 1000 UNITS tablet Take 1,000 Units by mouth daily.   Yes Historical Provider, MD  clopidogrel (PLAVIX) 75 MG tablet Take 1 tablet (75 mg total) by mouth daily. 11/02/11  Yes Monika Salk, MD  diazepam (VALIUM) 10 MG tablet Take 10 mg by mouth once. Given prior to MRI   Yes Historical Provider, MD  docusate sodium (COLACE) 100 MG capsule Take 100 mg by mouth 2 (two) times daily. 8am and 4pm   Yes Historical Provider, MD  DULoxetine (CYMBALTA) 30 MG capsule Take 30 mg by mouth 2 (two) times daily.  8am and 4pm   Yes Historical Provider, MD  HYDROcodone-acetaminophen (NORCO) 10-325 MG per tablet Take 1 tablet by mouth every 6 (six) hours as needed (pain).   Yes Historical Provider, MD  insulin aspart (NOVOLOG) 100 unit/mL injection Inject 0-10 Units into the skin 3 (three) times daily before meals. Sliding scale CBG <120 0 units, 121-150 3 units, 151-180 4 units, 181-250 6 units, 251-350 8 units, 351-400 10 units   Yes Historical Provider, MD  insulin glargine (LANTUS) 100 UNIT/ML injection Inject 0.1 mLs (10 Units total) into the skin daily. 08/14/13  Yes Ivan Anchors Love, PA-C  lidocaine (XYLOCAINE) 0.5 % injection Apply topically daily as needed (pain related to dialysis).   Yes Historical Provider, MD  LORazepam (ATIVAN) 0.5 MG tablet Take 0.5 mg by mouth See admin instructions. Take 1 tablet (0.5 mg) prior to dialysis on Tuesday, Thursday and Saturday for anxiety; and 1 tablet (0.5 mg) daily at bedtime, may also give 1 tablet (0.5 mg) every 4 hours as needed for anxiety.   Yes Historical Provider, MD  methimazole (TAPAZOLE) 10 MG tablet Take 10 mg by mouth 3 (three) times daily. 6am, 4pm, 10pm   Yes Historical Provider, MD  metoprolol tartrate (LOPRESSOR) 25 MG tablet Take 25 mg by mouth 2 (two) times daily. 8am, 4pm   Yes Historical Provider, MD  oxyCODONE (OXY IR/ROXICODONE) 5 MG immediate release tablet Take 5 mg by mouth every 6 (six) hours as needed (mild pain).   Yes Historical Provider, MD  pantoprazole (PROTONIX) 40 MG tablet Take 40 mg by mouth daily before breakfast.    Yes Historical Provider, MD  polyethylene glycol (MIRALAX / GLYCOLAX) packet Take 17 g by mouth daily.   Yes Historical Provider, MD  PRESCRIPTION MEDICATION Take 120 mLs by mouth 3 (three) times daily with meals. NSA Medpass   Yes Historical Provider, MD  ranolazine (RANEXA) 500 MG 12 hr tablet Take 500 mg by mouth 2 (two) times daily. 8am and 4pm   Yes Historical Provider, MD  sorbitol 70 % solution Take 30 mLs by mouth  2 (two) times daily as needed (constipation).   Yes Historical Provider, MD  traZODone (DESYREL) 50 MG tablet Take 1 tablet (50 mg total) by mouth at bedtime. 08/14/13  Yes Ivan Anchors Love, PA-C  lidocaine (LIDODERM) 5 % Place 2 patches onto the skin daily. Remove & Discard patch within 12 hours or as directed by MD 08/14/13   Bary Leriche, PA-C    Family History  Problem Relation Age of Onset  . Diabetes type II      History   Social History  .  Marital Status: Married    Spouse Name: N/A    Number of Children: N/A  . Years of Education: N/A   Social History Main Topics  . Smoking status: Former Smoker    Types: Cigarettes    Quit date: 05/24/1996  . Smokeless tobacco: Never Used  . Alcohol Use: No  . Drug Use: No  . Sexual Activity: None   Other Topics Concern  . None   Social History Narrative   Lives with Daughter in [place] has had a CVa and lives in a NH     Review of Systems: A 12 point ROS discussed and pertinent positives are indicated in the HPI above.  All other systems are negative.  Review of Systems  Constitutional: Positive for activity change, appetite change and fatigue. Negative for unexpected weight change.  Respiratory: Negative for cough and shortness of breath.   Cardiovascular: Negative for chest pain.  Gastrointestinal: Negative for abdominal pain.  Musculoskeletal: Positive for back pain.  Neurological: Positive for weakness.  Psychiatric/Behavioral: Positive for confusion. Negative for behavioral problems and agitation.    Vital Signs: BP 152/70 mmHg  Pulse 68  Temp(Src) 98.7 F (37.1 C) (Oral)  Resp 16  Ht 5\' 7"  (1.702 m)  Wt 67.4 kg (148 lb 9.4 oz)  BMI 23.27 kg/m2  SpO2 100%  Physical Exam  Cardiovascular: Normal rate and regular rhythm.   No murmur heard. Pulmonary/Chest: Effort normal and breath sounds normal. She has no wheezes.  Abdominal: Soft. Bowel sounds are normal. There is no tenderness.    Musculoskeletal:  Moves upper extr; good sensation No movement lower extr  Neurological: She is alert.  Skin: Skin is warm and dry.  Psychiatric: She has a normal mood and affect. Her behavior is normal. Thought content normal.  Pt alert/oriented to name; date; place; procedure But RN states MD has deemed pt incompetent Consented son via phone    Imaging: Dg Chest 1 View  12/14/2013   CLINICAL DATA:  Post right-sided thoracentesis.  EXAM: CHEST - 1 VIEW  COMPARISON:  12/10/2013; 12/09/2013  FINDINGS: Grossly unchanged enlarged cardiac silhouette and mediastinal contours post median sternotomy and CABG. Interval reduction / near resolution of right-sided pleural effusion post thoracentesis. No pneumothorax. Overall improved aeration of lungs with persistent bibasilar opacities, right greater than left, likely atelectasis. No new focal airspace opacities. Grossly unchanged bones. Radiopaque material is seen within the gastric fundus.  IMPRESSION: 1. Interval reduction/near resolution of right-sided effusion post thoracentesis. No pneumothorax. 2. Overall improved aeration of the lungs suggests resolving edema and atelectasis. 3. Residual right basilar opacities, favored to represent atelectasis.   Electronically Signed   By: Sandi Mariscal M.D.   On: 12/14/2013 11:15   Dg Chest 1 View  12/09/2013   CLINICAL DATA:  Post thoracentesis.  Back pain  EXAM: CHEST - 1 VIEW  COMPARISON:  12/06/2013  FINDINGS: Decreasing right pleural effusion following right thoracentesis. Small right pleural effusion persists. Airspace disease throughout the right lung which could represent edema or infection. Increasing airspace disease on the left. Heart is borderline in size. No visible left effusion. No pneumothorax following thoracentesis.  IMPRESSION: Decreasing right effusion following thoracentesis.  No pneumothorax.  Bilateral airspace disease, right greater than left. This is increased on the left since prior study.  This could represent asymmetric edema or infection.   Electronically Signed   By: Rolm Baptise M.D.   On: 12/09/2013 13:51   Ct Head Wo  Contrast  12/06/2013   CLINICAL DATA:  Altered mental status.  Headache.  EXAM: CT HEAD WITHOUT CONTRAST  TECHNIQUE: Contiguous axial images were obtained from the base of the skull through the vertex without intravenous contrast.  COMPARISON:  11/04/2012  FINDINGS: There is no evidence of intracranial hemorrhage, brain edema, or other signs of acute infarction. There is no evidence of intracranial mass lesion or mass effect. No abnormal extraaxial fluid collections are identified.  Generalized ventriculomegaly remains stable. No other intracranial abnormality identified. Old right frontal craniotomy or ventriculostomy defect again noted.  IMPRESSION: No acute intracranial abnormality. Stable ventriculomegaly and old right frontal craniotomy/ventriculostomy defect.   Electronically Signed   By: Earle Gell M.D.   On: 12/06/2013 18:15   Ct Chest W Contrast  12/07/2013   CLINICAL DATA:  Right pleural effusion.  Back pain.  EXAM: CT CHEST AND ABDOMEN WITH CONTRAST  TECHNIQUE: Multidetector CT imaging of the chest and abdomen was performed following the standard protocol during bolus administration of intravenous contrast.  CONTRAST:  174mL OMNIPAQUE IOHEXOL 300 MG/ML  SOLN  COMPARISON:  CT scans dated 07/17/2013 and 07/10/2013 and MRI of thoracic spine dated 12/06/2013  FINDINGS: CT CHEST FINDINGS  There is a large right pleural effusion with compressive atelectasis of the entire right lower lobe and of most of the right upper and middle lobes.  There is a tiny left effusion.  Heart size is normal.  There is a destructive process involving the T9, T10 and T11 vertebrae with extension of abnormal soft tissue into the spinal canal. There is prominent paraspinal soft tissue reaction. The patient has had previous posterior decompression at T10-11 and T11-12 and T12-L1.  The patient  has developed diffuse anasarca. There is an 11 mm dense nodule in the left breast. The patient has had a previous left breast biopsy.  CT ABDOMEN FINDINGS  The liver, spleen, pancreas, adrenal glands, and kidneys appear normal. Biliary tree is normal. The bowel is normal. Ovaries are normal. Uterus has been removed. No acute abnormality of the lumbar spine.  IMPRESSION: 1. Progressive destructive process involving the T9, T10, and T11 vertebra. Extension of abnormal soft tissue into the spinal canal at that level which probably and compresses the spinal cord. 2. Secondary extensive soft tissue reaction in the adjacent soft tissues. 3. Large right pleural effusion with compressive atelectasis the right lung. I suspect the effusion is due to the process in the lower thoracic spine. The biopsy at the time of decompression demonstrate no evidence of tumor. The possibility of focal amyloid deposition should be considered. Infection could also give this appearance although the biopsy did not demonstrate any evidence of infection at that time. 4. Negative CT scan of the abdomen.   Electronically Signed   By: Rozetta Nunnery M.D.   On: 12/07/2013 22:18   Ct Abdomen Pelvis W Contrast  12/13/2013   ADDENDUM REPORT: 12/13/2013 08:57  ADDENDUM: Regarding the area of luminal narrowing and mild wall thickening in the sigmoid colon, this does not appear overtly concerning for neoplasm on today's study although it is somewhat unusual for this same area to be narrow on the most recent comparison is well. This region appear normal on the CT scan before. It may represent area of evolving stenosis although there is no substantial diverticular change in the region. As warranted in this individual, sigmoidoscopy may prove helpful to further evaluate.   Electronically Signed   By: Misty Stanley M.D.   On: 12/13/2013 08:57  12/13/2013   CLINICAL DATA:  Subsequent encounter for thoracic spine mass.  EXAM: CT ABDOMEN AND PELVIS WITH  CONTRAST  TECHNIQUE: Multidetector CT imaging of the abdomen and pelvis was performed using the standard protocol following bolus administration of intravenous contrast.  CONTRAST:  178mL OMNIPAQUE IOHEXOL 300 MG/ML  SOLN  COMPARISON:  12/07/1947 year 07/10/2013.  FINDINGS: Lower chest: The right pleural effusion has decreased in the interval and is now moderate in size. There is bibasilar collapse/ consolidation in the dependent lower lobes.  Hepatobiliary: No focal abnormality within the liver parenchyma. Layering tiny stones are seen in the gallbladder lumen. No gallbladder wall thickening or pericholecystic fluid. No intrahepatic or extrahepatic biliary dilation.  Pancreas: No focal mass lesion. No dilatation of the main duct. No intraparenchymal cyst. No peripancreatic edema.  Spleen: No splenomegaly. No focal mass lesion.  Adrenals/Urinary Tract: No adrenal nodule or mass. There is no evidence for hydronephrosis or renal mass lesion. The urinary bladder is distended.  Stomach/Bowel: Stomach is nondistended. No gastric wall thickening. No evidence of outlet obstruction. Duodenum is normally positioned as is the ligament of Treitz. No small bowel wall thickening. No small bowel dilatation. Terminal ileum is normal. The appendix is normal.  On image 65 of series 2, there is a focal area of narrowing in the sigmoid colon. While this may be related to peristalsis, there are no other similar appearing areas in the colon and there was similar luminal narrowing with wall thickening at this location on the study from 12/07/2013 although the appearance was less prominent on the study from 07/10/2013.  Vascular/Lymphatic: Atherosclerotic calcification is noted in the wall of the abdominal aorta without aneurysm. There is no gastrohepatic or hepatoduodenal ligament lymphadenopathy. Portal vein is patent. No retroperitoneal lymphadenopathy. There is no pelvic sidewall lymphadenopathy.  Reproductive: Uterus is surgically  absent. There is no adnexal mass.  Other: Diffuse body wall edema noted. No intraperitoneal free fluid.  Musculoskeletal: The abnormal appearance of T9, T10, and T11 is again noted with near complete light cysts of the T10 vertebral body and most of the T11 vertebral body. Comparing to 10/30 1/15, the bony anatomy in this region is relatively stable in appearance however there has been an interval increase in abnormal soft tissue seen anterior to the T10 and T11 vertebral bodies.  IMPRESSION: Relatively stable appearance of the T9, T10, and T11 vertebral bodies although there is been interval increase in the abnormal paraspinal soft tissue at this level. The increase is evident, but very subtle on axial imaging, and best appreciated on the sagittal reconstructions. This region was biopsied during laminectomy on 07/16/2013 and pathology report at that time found no evidence of malignancy.  Interval decrease in the moderate size right pleural effusion with associated bibasilar collapse/ consolidation.  Cholelithiasis.  Diffuse body wall edema.  Electronically Signed: By: Misty Stanley M.D. On: 12/13/2013 08:33   Ct Abdomen Pelvis W Contrast  12/07/2013   CLINICAL DATA:  Right pleural effusion.  Back pain.  EXAM: CT CHEST AND ABDOMEN WITH CONTRAST  TECHNIQUE: Multidetector CT imaging of the chest and abdomen was performed following the standard protocol during bolus administration of intravenous contrast.  CONTRAST:  123mL OMNIPAQUE IOHEXOL 300 MG/ML  SOLN  COMPARISON:  CT scans dated 07/17/2013 and 07/10/2013 and MRI of thoracic spine dated 12/06/2013  FINDINGS: CT CHEST FINDINGS  There is a large right pleural effusion with compressive atelectasis of the entire right lower lobe and of most of the right upper and middle  lobes.  There is a tiny left effusion.  Heart size is normal.  There is a destructive process involving the T9, T10 and T11 vertebrae with extension of abnormal soft tissue into the spinal canal.  There is prominent paraspinal soft tissue reaction. The patient has had previous posterior decompression at T10-11 and T11-12 and T12-L1.  The patient has developed diffuse anasarca. There is an 11 mm dense nodule in the left breast. The patient has had a previous left breast biopsy.  CT ABDOMEN FINDINGS  The liver, spleen, pancreas, adrenal glands, and kidneys appear normal. Biliary tree is normal. The bowel is normal. Ovaries are normal. Uterus has been removed. No acute abnormality of the lumbar spine.  IMPRESSION: 1. Progressive destructive process involving the T9, T10, and T11 vertebra. Extension of abnormal soft tissue into the spinal canal at that level which probably and compresses the spinal cord. 2. Secondary extensive soft tissue reaction in the adjacent soft tissues. 3. Large right pleural effusion with compressive atelectasis the right lung. I suspect the effusion is due to the process in the lower thoracic spine. The biopsy at the time of decompression demonstrate no evidence of tumor. The possibility of focal amyloid deposition should be considered. Infection could also give this appearance although the biopsy did not demonstrate any evidence of infection at that time. 4. Negative CT scan of the abdomen.   Electronically Signed   By: Rozetta Nunnery M.D.   On: 12/07/2013 22:18   Dg Chest Port 1 View  12/10/2013   CLINICAL DATA:  Shortness of breath and pleural effusion.  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 Cardiac silhouette remains mildly enlarged. Small right pleural effusion is stable to minimally increased in size. There is mild pulmonary vascular congestion with right greater than left lung airspace opacities which have overall mildly increased from the prior study. There is new partial silhouetting of the left hemidiaphragm. Trace left pleural effusion is questioned. No pneumothorax is identified.  IMPRESSION: 1. Stable  to minimally increased size of small right pleural effusion. 2. Mildly increased right greater than left lung airspace disease, which may reflect asymmetric edema (including re-expansion pulmonary edema on the right related to recent thoracentesis) although infection is not excluded.   Electronically Signed   By: Logan Bores   On: 12/10/2013 08:19   Dg Chest Portable 1 View  12/06/2013   CLINICAL DATA:  Initial encounter for 1 day history of respiratory distress  EXAM: PORTABLE CHEST - 1 VIEW  COMPARISON:  Chest x-ray from 04/02/2013.  Chest CT from 07/17/2013.  FINDINGS: 1624 hrs. There is a large right pleural effusion with diffuse airspace disease within the visualized portions of the right lung. No left-sided pleural effusion. No pulmonary edema in the left lung. Cardiopericardial silhouette is enlarged. Patient is status post CABG. Telemetry leads overlie the chest.  IMPRESSION: New large right-sided pleural effusion. Unilateral pleural effusion can be a sign of malignancy.   Electronically Signed   By: Misty Stanley M.D.   On: 12/06/2013 16:45   US Thoracentesis Asp Pleural Space W/img Guide  12/14/2013   INDICATION: Symptomatic RIGHT sided pleural effusion  EXAM: US THORACENTESIS ASP PLEURAL SPACE W/IMG GUIDE  COMPARISON:  Previous thoracentesis  MEDICATIONS: 10 cc 1% lidocaine  COMPLICATIONS: None immediate  TECHNIQUE: Informed written consent was obtained from the patient after a discussion of the risks, benefits and alternatives to treatment. A timeout was performed  prior to the initiation of the procedure.  Initial ultrasound scanning demonstrates a right pleural effusion. The lower chest was prepped and draped in the usual sterile fashion. 1% lidocaine was used for local anesthesia.  Under direct ultrasound guidance, a 19 gauge, 7-cm, Yueh catheter was introduced. An ultrasound image was saved for documentation purposes. The thoracentesis was performed. The catheter was removed and a dressing was  applied. The patient tolerated the procedure well without immediate post procedural complication. The patient was escorted to have an upright chest radiograph.  FINDINGS: A total of approximately 1 liters of yellow fluid was removed. Requested samples were sent to the laboratory.  IMPRESSION: Successful ultrasound-guided R sided thoracentesis yielding 1 liters of pleural fluid.  Read by:  Lavonia Drafts [facility]   Electronically Signed   By: Sandi Mariscal M.D.   On: 12/14/2013 11:01   US Thoracentesis Asp Pleural Space W/img Guide  12/09/2013   CLINICAL DATA:  Right pleural effusion  EXAM: ULTRASOUND GUIDED right THORACENTESIS  COMPARISON:  None.  PROCEDURE: An ultrasound guided thoracentesis was thoroughly discussed with the patient and questions answered. The benefits, risks, alternatives and complications were also discussed. The patient understands and wishes to proceed with the procedure. Written consent was obtained.  Ultrasound was performed to localize and mark an adequate pocket of fluid in the right chest. The area was then prepped and draped in the normal sterile fashion. 1% Lidocaine was used for local anesthesia. Under ultrasound guidance a 19 gauge Yueh catheter was introduced. Thoracentesis was performed. The catheter was removed and a dressing applied.  Complications:  None  FINDINGS: A total of approximately 1.2 L of dark yellow fluid was removed. A fluid sample wassent for laboratory analysis.  IMPRESSION: Successful ultrasound guided right thoracentesis yielding 1.2 L of pleural fluid.  Read by:  Lavonia Drafts [facility]   Electronically Signed   By: Daryll Brod M.D.   On: 12/09/2013 14:32    Labs:  CBC:  Recent Labs  12/10/13 0719 12/12/13 1000 12/14/13 1419 12/15/13 0725  WBC 10.6* 11.5* 13.3* 12.6*  HGB 9.3* 9.3* 9.3* 9.7*  HCT 29.2* 29.7* 28.4* 31.0*  PLT 460* 459* 521* 454*    COAGS:  Recent Labs  12/06/13 1617  INR 1.23    BMP:  Recent Labs  12/12/13 1000  12/14/13 1420 12/15/13 0725 12/16/13 0615  NA 132* 127* 133* 132*  K 4.6 4.2 4.3 4.5  CL 90* 85* 93* 90*  CO2 25 26 27 26   GLUCOSE 181* 357* 162* 144*  BUN 25* 33* 23 37*  CALCIUM 9.0 9.1 9.0 9.1  CREATININE 4.06* 3.60* 2.50* 3.22*  GFRNONAA 10* 12* 18* 13*  GFRAA 12* 14* 21* 16*    LIVER FUNCTION TESTS:  Recent Labs  07/17/13 1110  12/06/13 1617 12/07/13 0604 12/10/13 0719 12/12/13 1000 12/14/13 1420  BILITOT 0.3  --  0.5 0.5  --  0.4  --   AST 21  --  29 25  --  29  --   ALT 10  --  23 19  --  28  --   ALKPHOS 96  --  155* 145*  --  160*  --   PROT 8.0  --  7.6 7.0  --  7.2  --   ALBUMIN 3.5  < > 2.2* 2.0* 2.0* 2.1* 2.2*  < > = values in this interval not displayed.  TUMOR MARKERS: No results for input(s): AFPTM, CEA, CA199, CHROMGRNA in the last 8760  hours.  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  I greatly enjoyed meeting Kaiyla Stahly and look forward to participating in their care..    I spent a total of 40 minutes face to face in clinical consultation, greater than 50% of which was counseling/coordinating care for T10 bx/asp  Signed: TURPIN,PAMELA A 12/16/2013, 3:19 PM

## 2013-12-16 NOTE — Progress Notes (Signed)
NUTRITION FOLLOW UP  INTERVENTION: Continue Resource Breeze po BID, each supplement provides 250 kcal and 9 grams of protein.  Continue Nepro Shake po once daily, each supplement provides 425 kcal and 19 grams protein.  Encourage adequate PO intake.  NUTRITION DIAGNOSIS: Inadequate oral intake related to decreased appetite as evidenced by meal completion of 0%; improved  Goal: Pt to meet >/= 90% of their estimated nutrition needs; met  Monitor:  PO intake, weight trends, labs, I/O's  73 y.o. female  Admitting Dx: Paraparesis  ASSESSMENT: Pt has a past medical history of Chronic kidney disease; Arthritis; Neuropathy in diabetes; Insomnia; Asthma; CAD (coronary artery disease) (2012); GERD (gastroesophageal reflux disease); Diabetes mellitus; COPD (chronic obstructive pulmonary disease); Hypertension; Peripheral vascular disease; Anemia; CHF (congestive heart failure); Coronary artery dilation; Myocardial infarction (2013); Colloid cyst of brain; H/O Bell's palsy; Frequent headaches; and Orthostasis.   Meal completion varied from 50-100%, however intake has been improving. Pt has been drinking her supplements. Will continue with current orders. Encourage continuation of adequate intake.  Labs: Low sodium, chloride, GFR. High BUN and creatinine.  Height: Ht Readings from Last 1 Encounters:  12/07/13 5' 7" (1.702 m)    Weight: Wt Readings from Last 1 Encounters:  12/15/13 148 lb 9.4 oz (67.4 kg)    BMI:  Body mass index is 23.27 kg/(m^2).  Re-Estimated Nutritional Needs: Kcal: 1700-1900 Protein: 85-95 grams Fluid: 1.2 L/day  Skin: Stage II pressure ulcer on sacrum, unstagable pressure ulcer on heel; non-pitting LE edema  Diet Order: Diet renal/carb modified with 1200 ml fluid restriction   Intake/Output Summary (Last 24 hours) at 12/16/13 1501 Last data filed at 12/16/13 1440  Gross per 24 hour  Intake    423 ml  Output    427 ml  Net     -4 ml    Last BM:  11/9  Labs:   Recent Labs Lab 12/10/13 0719  12/14/13 1420 12/15/13 0725 12/16/13 0615  NA 131*  < > 127* 133* 132*  K 5.0  < > 4.2 4.3 4.5  CL 90*  < > 85* 93* 90*  CO2 27  < > 26 27 26  BUN 30*  < > 33* 23 37*  CREATININE 4.45*  < > 3.60* 2.50* 3.22*  CALCIUM 8.6  < > 9.1 9.0 9.1  PHOS 5.2*  --  2.2*  --   --   GLUCOSE 156*  < > 357* 162* 144*  < > = values in this interval not displayed.  CBG (last 3)   Recent Labs  12/15/13 1722 12/16/13 0805 12/16/13 1142  GLUCAP 397* 134* 248*    Scheduled Meds: . amLODipine  10 mg Oral Daily  . aspirin EC  81 mg Oral Daily  . atorvastatin  40 mg Oral QHS  . collagenase   Topical Daily  . darbepoetin (ARANESP) injection - DIALYSIS  50 mcg Intravenous Q Tue-HD  . dexamethasone  4 mg Intravenous Q12H  . docusate sodium  100 mg Oral BID  . DULoxetine  30 mg Oral Daily  . feeding supplement (NEPRO CARB STEADY)  237 mL Oral Q1500  . feeding supplement (RESOURCE BREEZE)  1 Container Oral BID BM  . fentaNYL  25 mcg Transdermal Q72H  . gabapentin  100 mg Oral Once per day on Mon Wed Fri  . insulin aspart  0-15 Units Subcutaneous TID WC  . insulin glargine  20 Units Subcutaneous QHS  . methimazole  20 mg Oral TID  . metoprolol   tartrate  25 mg Oral BID  . multivitamin  1 tablet Oral QHS  . pantoprazole  40 mg Oral Daily  . polyethylene glycol  17 g Oral Daily  . ranolazine  500 mg Oral BID  . senna-docusate  2 tablet Oral QHS  . sodium chloride  3 mL Intravenous Q12H  . traZODone  25 mg Oral QHS    Continuous Infusions:   Past Medical History  Diagnosis Date  . Chronic kidney disease     just found out? Placed graft in [facility] Tarri Glenn [facility].  started dialysis in 06/2011  . Arthritis     Osteoarthritis  . Neuropathy in diabetes   . Insomnia   . Asthma   . CAD (coronary artery disease) 2012    7 stents-First PCI was in [place].  The last time she had a procedure was  a stent last year, thena  CABG was  undertaken in October [facility])  . GERD (gastroesophageal reflux disease)   . Diabetes mellitus     Type II  . COPD (chronic obstructive pulmonary disease)   . Hypertension   . Peripheral vascular disease     endarterectomy  . Anemia   . CHF (congestive heart failure)   . Coronary artery dilation   . Myocardial infarction 2013    x 3  . 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 H/O Bell's palsy   . Frequent headaches   . Orthostasis     Past Surgical History  Procedure Laterality Date  . Abdominal hysterectomy    . Coronary artery bypass graft  2012    7 stents  . Cea      Left  . Carpal tunnel release    . Av fistula placement    . Grave's disease      ? unclea rif ca or not   . Laminectomy N/A 07/16/2013    Procedure: THORACIC LAMINECTOMY FOR TUMOR;  Surgeon: Erline Levine, MD;  Location: [place];  Service: Neurosurgery;  Laterality: N/A;    Kallie Locks, MS, RD, LDN Pager # [phone] After hours/ weekend pager # [phone]

## 2013-12-16 NOTE — Progress Notes (Signed)
Pt c/o her stomach hurting, feels nervous. Questioned pt if she had to have a BM, pt states she has been having those, can't control it. States she can't tell if she has voided, sitter, nursing students and NT working with pt today state that pt has been dry all day.  Bladder scan performed, with 427cc in bladder. Dr Waldron Labs notified. (pt was retaining yesterday also with a volume of 900cc per reporting nurse). Orders received to place indwelling foley catheter for urinary retention.

## 2013-12-16 NOTE — Progress Notes (Signed)
OT Cancellation Note  Patient Details Name: Holly Ellison MRN: [id number] DOB: Jan 23, 1941   Cancelled Treatment:    Reason Eval/Treat Not Completed: Other (comment) Pt is Medicare and current D/C plan is SNF. No apparent immediate acute care OT needs, therefore will defer OT to SNF. If OT eval is needed please call Acute Rehab Dept. at [phone] or text page OT at [phone].  [place], OTR/L  [phone] 12/16/2013 12/16/2013, 8:29 AM

## 2013-12-16 NOTE — Progress Notes (Signed)
Inpatient Diabetes Program Recommendations  AACE/ADA: New Consensus Statement on Inpatient Glycemic Control (2013)  Target Ranges:  Prepandial:   less than 140 mg/dL      Peak postprandial:   less than 180 mg/dL (1-2 hours)      Critically ill patients:  140 - 180 mg/dL   Results for YARELLY, KUBA (MRN [id number]) as of 12/16/2013 10:52  Ref. Range 12/14/2013 07:48 12/14/2013 11:39 12/15/2013 07:48 12/15/2013 11:52 12/15/2013 17:22 12/16/2013 08:05  Glucose-Capillary Latest Range: 70-99 mg/dL 248 (H) 299 (H) 157 (H) 276 (H) 397 (H) 134 (H)   Diabetes history: DM Outpatient Diabetes medications: Lantus 10 units daily, Novolog 0-10 units TID with meals Current orders for Inpatient glycemic control: Lantus 20 units QHS, Novolog 0-15 units TID with meals  Inpatient Diabetes Program Recommendations Insulin - Meal Coverage: Post prandial glucose consistently elevated. While ordered steroids, please consider ordering Novolog 6 units TID with meals for meal coverage.  Insulin-Correction: Please consider ordering Novolog bedtime correction scale.  Thanks, Barnie Alderman, RN, MSN, CCRN, CDE Diabetes Coordinator Inpatient Diabetes Program [phone] (Team Pager) [phone] (AP office) [phone] [facility] office)

## 2013-12-17 ENCOUNTER — Inpatient Hospital Stay (HOSPITAL_COMMUNITY): Payer: Medicare Other

## 2013-12-17 LAB — UIFE/LIGHT CHAINS/TP QN, 24-HR UR
ALBUMIN, U: DETECTED
Alpha 1, Urine: DETECTED — AB
Alpha 2, Urine: DETECTED — AB
BETA UR: DETECTED — AB
Gamma Globulin, Urine: DETECTED — AB
Total Protein, Urine: 233 mg/dL — ABNORMAL HIGH (ref 5–24)

## 2013-12-17 LAB — CBC
HEMATOCRIT: 31.3 % — AB (ref 36.0–46.0)
Hemoglobin: 10.4 g/dL — ABNORMAL LOW (ref 12.0–15.0)
MCH: 30.7 pg (ref 26.0–34.0)
MCHC: 33.2 g/dL (ref 30.0–36.0)
MCV: 92.3 fL (ref 78.0–100.0)
Platelets: 487 10*3/uL — ABNORMAL HIGH (ref 150–400)
RBC: 3.39 MIL/uL — ABNORMAL LOW (ref 3.87–5.11)
RDW: 16.4 % — AB (ref 11.5–15.5)
WBC: 16.6 10*3/uL — ABNORMAL HIGH (ref 4.0–10.5)

## 2013-12-17 LAB — GLUCOSE, CAPILLARY
GLUCOSE-CAPILLARY: 261 mg/dL — AB (ref 70–99)
GLUCOSE-CAPILLARY: 97 mg/dL (ref 70–99)
GLUCOSE-CAPILLARY: 282 mg/dL — AB (ref 70–99)
GLUCOSE-CAPILLARY: 206 mg/dL — AB (ref 70–99)
Glucose-Capillary: 121 mg/dL — ABNORMAL HIGH (ref 70–99)
Glucose-Capillary: 158 mg/dL — ABNORMAL HIGH (ref 70–99)

## 2013-12-17 LAB — APTT: aPTT: 28 seconds (ref 24–37)

## 2013-12-17 LAB — RENAL FUNCTION PANEL
ALBUMIN: 2.3 g/dL — AB (ref 3.5–5.2)
Anion gap: 17 — ABNORMAL HIGH (ref 5–15)
BUN: 24 mg/dL — ABNORMAL HIGH (ref 6–23)
CO2: 27 meq/L (ref 19–32)
Calcium: 8.8 mg/dL (ref 8.4–10.5)
Chloride: 92 mEq/L — ABNORMAL LOW (ref 96–112)
Creatinine, Ser: 2.23 mg/dL — ABNORMAL HIGH (ref 0.50–1.10)
GFR calc Af Amer: 24 mL/min — ABNORMAL LOW (ref >90)
GFR, EST NON AFRICAN AMERICAN: 21 mL/min — AB (ref >90)
Glucose, Bld: 209 mg/dL — ABNORMAL HIGH (ref 70–99)
POTASSIUM: 3.7 meq/L (ref 3.7–5.3)
Phosphorus: 2.2 mg/dL — ABNORMAL LOW (ref 2.3–4.6)
Sodium: 136 mEq/L — ABNORMAL LOW (ref 137–147)

## 2013-12-17 LAB — SEDIMENTATION RATE: SED RATE: 101 mm/h — AB (ref 0–22)

## 2013-12-17 LAB — PROTIME-INR
INR: 1.09 (ref 0.00–1.49)
PROTHROMBIN TIME: 14.3 s (ref 11.6–15.2)

## 2013-12-17 LAB — HEPATITIS C ANTIBODY (REFLEX): HCV AB: NEGATIVE

## 2013-12-17 LAB — C-REACTIVE PROTEIN: CRP: 0.9 mg/dL — ABNORMAL HIGH (ref <0.60)

## 2013-12-17 LAB — HIV ANTIBODY (ROUTINE TESTING W REFLEX): HIV: NONREACTIVE

## 2013-12-17 MED ORDER — MIDAZOLAM HCL 2 MG/2ML IJ SOLN
INTRAMUSCULAR | Status: AC
  Filled 2013-12-17: qty 4

## 2013-12-17 MED ORDER — MIDAZOLAM HCL 2 MG/2ML IJ SOLN
INTRAMUSCULAR | Status: DC | PRN
  Administered 2013-12-17 (×2): 1 mg via INTRAVENOUS

## 2013-12-17 MED ORDER — FENTANYL CITRATE 0.05 MG/ML IJ SOLN
INTRAMUSCULAR | Status: AC
  Filled 2013-12-17: qty 4

## 2013-12-17 MED ORDER — BUPIVACAINE HCL (PF) 0.25 % IJ SOLN
INTRAMUSCULAR | Status: AC
  Filled 2013-12-17: qty 30

## 2013-12-17 MED ORDER — HYDROMORPHONE HCL 2 MG PO TABS
ORAL_TABLET | ORAL | Status: AC
  Administered 2013-12-17: 21:00:00
  Filled 2013-12-17: qty 1

## 2013-12-17 MED ORDER — NALOXONE HCL 0.4 MG/ML IJ SOLN
INTRAMUSCULAR | Status: AC
  Filled 2013-12-17: qty 1

## 2013-12-17 MED ORDER — DARBEPOETIN ALFA 100 MCG/0.5ML IJ SOSY
PREFILLED_SYRINGE | INTRAMUSCULAR | Status: AC
  Filled 2013-12-17: qty 0.5

## 2013-12-17 MED ORDER — FENTANYL CITRATE 0.05 MG/ML IJ SOLN
INTRAMUSCULAR | Status: DC | PRN
  Administered 2013-12-17 (×3): 25 ug via INTRAVENOUS

## 2013-12-17 MED ORDER — HYDROMORPHONE HCL 1 MG/ML IJ SOLN
INTRAMUSCULAR | Status: AC
  Filled 2013-12-17: qty 2

## 2013-12-17 MED ORDER — SODIUM CHLORIDE 0.9 % IJ SOLN
INTRAMUSCULAR | Status: AC
  Filled 2013-12-17: qty 10

## 2013-12-17 MED ORDER — SODIUM CHLORIDE 0.9 % IV SOLN: INTRAVENOUS | Status: AC

## 2013-12-17 MED ORDER — FLUMAZENIL 0.5 MG/5ML IV SOLN
INTRAVENOUS | Status: DC | PRN
  Administered 2013-12-17: .25 mg via INTRAVENOUS

## 2013-12-17 MED ORDER — METOPROLOL TARTRATE 50 MG PO TABS
50.0000 mg | ORAL_TABLET | Freq: Two times a day (BID) | ORAL | Status: DC
  Administered 2013-12-17 – 2013-12-19 (×4): 50 mg via ORAL
  Filled 2013-12-17 (×6): qty 1

## 2013-12-17 MED ORDER — HYDROMORPHONE HCL 2 MG PO TABS
ORAL_TABLET | ORAL | Status: AC
  Administered 2013-12-17: 2 mg
  Filled 2013-12-17: qty 1

## 2013-12-17 MED ORDER — HYDRALAZINE HCL 20 MG/ML IJ SOLN
INTRAMUSCULAR | Status: AC
  Filled 2013-12-17: qty 1

## 2013-12-17 MED ORDER — FLUMAZENIL 0.5 MG/5ML IV SOLN
INTRAVENOUS | Status: AC
  Filled 2013-12-17: qty 5

## 2013-12-17 NOTE — Sedation Documentation (Signed)
Received from IR 1 to Radiology nurses station.  Pt hs received .5 of Romazicon post procedure for unresponsiveness. Now alert and conversing normally.  Floor RN and hemodialysis advised of extended stay in Radiology due to reversal.

## 2013-12-17 NOTE — Sedation Documentation (Signed)
Arouses easily when name called.  Encouraged to take deep breaths.

## 2013-12-17 NOTE — Progress Notes (Signed)
[facility] for Infectious Disease    Subjective: No new complaints   Antibiotics:  Anti-infectives    None      Medications: Scheduled Meds: . amLODipine  10 mg Oral Daily  . aspirin EC  81 mg Oral Daily  . atorvastatin  40 mg Oral QHS  . bupivacaine (PF)      . collagenase   Topical Daily  . darbepoetin (ARANESP) injection - DIALYSIS  50 mcg Intravenous Q Tue-HD  . dexamethasone  4 mg Intravenous Q12H  . docusate sodium  100 mg Oral BID  . DULoxetine  30 mg Oral Daily  . feeding supplement (NEPRO CARB STEADY)  237 mL Oral Q1500  . feeding supplement (RESOURCE BREEZE)  1 Container Oral BID BM  . fentaNYL  25 mcg Transdermal Q72H  . fentaNYL      . flumazenil      . gabapentin  100 mg Oral Once per day on Mon Wed Fri  . hydrALAZINE      . HYDROmorphone      . insulin aspart  0-15 Units Subcutaneous TID WC  . insulin glargine  20 Units Subcutaneous QHS  . methimazole  20 mg Oral TID  . metoprolol tartrate  25 mg Oral BID  . midazolam      . multivitamin  1 tablet Oral QHS  . naloxone      . pantoprazole  40 mg Oral Daily  . polyethylene glycol  17 g Oral Daily  . ranolazine  500 mg Oral BID  . senna-docusate  2 tablet Oral QHS  . sodium chloride  3 mL Intravenous Q12H  . sodium chloride      . traZODone  25 mg Oral QHS   Continuous Infusions:  PRN Meds:.acetaminophen **OR** acetaminophen, albuterol, ALPRAZolam, baclofen, dextrose, fentaNYL, hydrALAZINE, HYDROmorphone, midazolam, [DISCONTINUED] ondansetron **OR** ondansetron (ZOFRAN) IV, ondansetron    Objective: Weight change: 3 lb 1.4 oz (1.4 kg)  Intake/Output Summary (Last 24 hours) at 12/17/13 1209 Last data filed at 12/17/13 0845  Gross per 24 hour  Intake    560 ml  Output    384 ml  Net    176 ml   Blood pressure 170/79, pulse 80, temperature 98.1 F (36.7 C), temperature source Oral, resp. rate 9, height 5' 7"  (1.702 m), weight 151 lb 10.8 oz (68.8 kg), SpO2 100 %. Temp:  [97.6 F (36.4  C)-99.1 F (37.3 C)] 98.1 F (36.7 C) (11/10 1100) Pulse Rate:  [78-92] 80 (11/10 1155) Resp:  [8-16] 9 (11/10 1155) BP: (165-191)/(67-83) 170/79 mmHg (11/10 1155) SpO2:  [98 %-100 %] 100 % (11/10 1155) Weight:  [151 lb 10.8 oz (68.8 kg)] 151 lb 10.8 oz (68.8 kg) (11/10 8891)  Physical Exam: General: Alert and awake, oriented x3, not in any acute distress. HEENT:EOMI CVS regular rate, normal r,  no murmur rubs or gallops Chest: clear to auscultation bilaterally, no wheezing, rales or rhonchi Abdomen: soft nontender, nondistended, normal bowel sounds, Extremities: no  clubbing or edema noted bilaterally Neuro: nonfocal  CBC:  Recent Labs Lab 12/12/13 1000 12/14/13 1419 12/15/13 0725 12/17/13 0947  HGB 9.3* 9.3* 9.7* 10.4*  HCT 29.7* 28.4* 31.0* 31.3*  PLT 459* 521* 454* 487*  INR  --   --   --  1.09  APTT  --   --   --  28     BMET  Recent Labs  12/15/13 0725 12/16/13 0615  NA 133* 132*  K 4.3 4.5  CL  93* 90*  CO2 27 26  GLUCOSE 162* 144*  BUN 23 37*  CREATININE 2.50* 3.22*  CALCIUM 9.0 9.1     Liver Panel   Recent Labs  12/14/13 1420  ALBUMIN 2.2*       Sedimentation Rate  Recent Labs  12/17/13 0947  ESRSEDRATE 101*   C-Reactive Protein No results for input(s): CRP in the last 72 hours.  Micro Results: Recent Results (from the past 720 hour(s))  MRSA PCR Screening     Status: None   Collection Time: 12/07/13  1:03 AM  Result Value Ref Range Status   MRSA by PCR NEGATIVE NEGATIVE Final    Comment:        The GeneXpert MRSA Assay (FDA approved for NASAL specimens only), is one component of a comprehensive MRSA colonization surveillance program. It is not intended to diagnose MRSA infection nor to guide or monitor treatment for MRSA infections.  Culture, blood (routine x 2)     Status: None   Collection Time: 12/07/13 10:52 AM  Result Value Ref Range Status   Specimen Description BLOOD RIGHT HAND  Final   Special Requests  BOTTLES DRAWN AEROBIC ONLY 1CC  Final   Culture  Setup Time   Final    12/07/2013 20:10 Performed at [facility]    Culture   Final    NO GROWTH 5 DAYS Performed at [facility]    Report Status 12/13/2013 FINAL  Final  Culture, blood (routine x 2)     Status: None   Collection Time: 12/07/13 11:00 AM  Result Value Ref Range Status   Specimen Description BLOOD RIGHT HAND  Final   Special Requests BOTTLES DRAWN AEROBIC ONLY 5CC  Final   Culture  Setup Time   Final    12/07/2013 20:10 Performed at [facility]    Culture   Final    NO GROWTH 5 DAYS Performed at [facility]    Report Status 12/13/2013 FINAL  Final  Fungus Culture with Smear     Status: None (Preliminary result)   Collection Time: 12/09/13  1:28 PM  Result Value Ref Range Status   Specimen Description FLUID RIGHT PLEURAL  Final   Special Requests NONE  Final   Fungal Smear   Final    NO YEAST OR FUNGAL ELEMENTS SEEN Performed at [facility]    Culture   Final    CULTURE IN PROGRESS FOR FOUR WEEKS Performed at [facility]    Report Status PENDING  Incomplete  Body fluid culture     Status: None   Collection Time: 12/09/13  1:28 PM  Result Value Ref Range Status   Specimen Description FLUID RIGHT PLEURAL  Final   Special Requests NONE  Final   Gram Stain   Final    RARE WBC PRESENT, PREDOMINANTLY MONONUCLEAR NO ORGANISMS SEEN Performed at [facility]    Culture   Final    NO GROWTH 3 DAYS Performed at [facility]    Report Status 12/13/2013 FINAL  Final  AFB culture with smear     Status: None (Preliminary result)   Collection Time: 12/09/13  1:28 PM  Result Value Ref Range Status   Specimen Description FLUID RIGHT PLEURAL  Final   Special Requests NONE  Final   Acid Fast Smear   Final    NO ACID FAST BACILLI SEEN Performed at [facility]    Culture   Final  CULTURE WILL BE EXAMINED FOR 6 WEEKS BEFORE ISSUING A  FINAL REPORT Performed at [facility]    Report Status PENDING  Incomplete    Studies/Results: No results found.    Assessment/Plan:  Principal Problem:   Fracture of thoracic spine with cord lesion Active Problems:   Atherosclerosis of native arteries of the extremities with ulceration   CAD (coronary artery disease) of bypass graft   Diabetes mellitus   Hypertension   COPD (chronic obstructive pulmonary disease)   Hyperthyroidism   ESRD on dialysis   Paraparesis   Compression of spinal cord with myelopathy   Pleural effusion   Altered mental state   Pain   Acute delirium   Palliative care encounter   CN (constipation)   Acute respiratory failure with hypoxia   Somnolence   Type 2 diabetes mellitus with diabetic chronic kidney disease   Pressure ulcer   Normocytic anemia   Poor dentition   Unintentional weight loss   Pleural effusion on left   Back pain    Holly Ellison is a 73 y.o. female with ESRD on HD with prior MSSA bacteremia found toa vhe lytic t11 vertebral lesion sp operative decompression and biopsy with The biopsy revealed only dense fibrocartilaginous tissue without evidence for malignancy or infection. The biopsy was not sent for culture. She was afebrile and blood cultures were negative at that time. Progressive destructive process involving the T9, T10, and T11vertebra. Extension of abnormal soft tissue into the spinal canal at that level which probably and compresses the spinal cord. Secondary extensive soft tissue reaction in the adjacent soft tissues.. Large right pleural effusion that is exudative  #1 T spine disease: After reviewing her case I think this is [facility] MORE likely to represent bacterial infection of the vertebral spine likely from prior MSSA bacteremia. MTB PCR from pleural effusion and ADA pending. Esr 101.   I DONT Think this is likely TB  --GREATLY APPRECIATE  GETTING IR GUIDED ASPIRATE OF HER THORACIC VERTEBRA WITH SPECIMEN  SENT FOR BACTERIAL CULTURES, AFB STAIN AND CULTURE AND FUNGAL STAIN AND CULTURE  #2 PLEURAL EFFUSION  NOT likely to be TB MTB PCR and adenosine deaminase are pending  #3 Screening: she needs HIV and Hep C screening      LOS: 11 days   Alcide Evener 12/17/2013, 12:09 PM

## 2013-12-17 NOTE — Consult Note (Signed)
[facility] Consult   Reason for Consult:  Capacity evaluation Referring Physician:  Dr. Richardean Sale Holly Ellison is an 73 y.o. female. Total Time spent with patient: 45 minutes  Assessment: AXIS I:  Mood Disorder NOS AXIS II:  Deferred AXIS III:   Past Medical History  Diagnosis Date  . Chronic kidney disease     just found out? Placed graft in NOv-Dr. Tarri Glenn in [facility].  started dialysis in 06/2011  . Arthritis     Osteoarthritis  . Neuropathy in diabetes   . Insomnia   . Asthma   . CAD (coronary artery disease) 2012    7 stents-First PCI was in [place].  The last time she had a procedure was  a stent last year, thena  CABG was undertaken in October [facility])  . GERD (gastroesophageal reflux disease)   . Diabetes mellitus     Type II  . COPD (chronic obstructive pulmonary disease)   . Hypertension   . Peripheral vascular disease     endarterectomy  . Anemia   . CHF (congestive heart failure)   . Coronary artery dilation   . Myocardial infarction 2013    x 3  . 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 H/O Bell's palsy   . Frequent headaches   . Orthostasis    AXIS IV:  other psychosocial or environmental problems, problems related to social environment and problems with primary support group AXIS V:  41-50 serious symptoms  Plan: Case discussed with Dr. Darrick Meigs, and staff RN Discontinue safety sitter Patient does not meet criteria for capacity to make her own medical decisions based on my evaluation today. Please contact psych social service if needs assistance with [facility] No evidence of imminent risk to self or others at present.   Supportive therapy provided about ongoing stressors.  Appreciate psychiatric consultation and will sign off at this time Please contact [phone] or [phone] if needs further assistance  Subjective:   Holly Ellison is a 73 y.o. female patient admitted with partially compliance with hemodialysis.  HPI:   Holly Ellison is a 73 y.o. female seen for face to face psych consultation in [facility] for capacity evaluation. Patient has orientation to her self, her son, name of the hospital but poorly cooperative with further evaluation. Patient son feels that she does not understand most of the questions asked, she has been verbally responding with significant delay or choosing not to respond to some of the questions. Patient stated that she is refusing her dialysis because staying in one position is too painful to her. She has denied suicidal / homicidal ideations, intention or plans. She has depressed mood and flat affect but she denies being depressed. She has poor understanding about her severity and complexity of her medical conditions and required treatment needs. She names her medical problems as restless leg syndrome and diabetes and proposed treatment is hemodialysis, which she has been receiving over one and half years. Patient has five children and her youngest son has POA.   Interval History: Case discussed with Dr. Darrick Meigs and staff RN. Patient has been compliant with her treatment and has no current suicidal or homicidal ideations, intention or plans.    Past Psychiatric History: Past Medical History  Diagnosis Date  . Chronic kidney disease     just found out? Placed graft in NOv-Dr. Tarri Glenn in [facility].  started dialysis in 06/2011  . Arthritis  Osteoarthritis  . Neuropathy in diabetes   . Insomnia   . Asthma   . CAD (coronary artery disease) 2012    7 stents-First PCI was in [place].  The last time she had a procedure was  a stent last year, thena  CABG was undertaken in October [facility])  . GERD (gastroesophageal reflux disease)   . Diabetes mellitus     Type II  . COPD (chronic obstructive pulmonary disease)   . Hypertension   . Peripheral vascular disease     endarterectomy  . Anemia   . CHF (congestive heart failure)   . Coronary artery dilation   .  Myocardial infarction 2013    x 3  . 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 H/O Bell's palsy   . Frequent headaches   . Orthostasis     reports that she quit smoking about 17 years ago. Her smoking use included Cigarettes. She smoked 0.00 packs per day. She has never used smokeless tobacco. She reports that she does not drink alcohol or use illicit drugs. Family History  Problem Relation Age of Onset  . Diabetes type II           Abuse/Neglect [facility]) Physical Abuse: Denies Verbal Abuse: Denies Sexual Abuse: Denies Allergies:   Allergies  Allergen Reactions  . Morphine Sulfate Itching  . Opium Itching  . Ciprofloxacin Hcl Itching  . Lexapro [Escitalopram Oxalate] Other (See Comments)    Mental status changes/lethargy  . Procainamide Hcl Other (See Comments)    Unknown reaction  . Robaxin [Methocarbamol] Other (See Comments)    lethargy    Objective: Blood pressure 180/73, pulse 80, temperature 97.6 F (36.4 C), temperature source Axillary, resp. rate 16, height _0  (1.702 m), weight 68.8 kg (151 lb 10.8 oz), SpO2 99 %.Body mass index is 23.75 kg/(m^2). Results for orders placed or performed during the hospital encounter of 12/06/13 (from the past 72 hour(s))  Glucose, capillary     Status: Abnormal   Collection Time: 12/14/13 11:39 AM  Result Value Ref Range   Glucose-Capillary 299 (H) 70 - 99 mg/dL  CBC     Status: Abnormal   Collection Time: 12/14/13  2:19 PM  Result Value Ref Range   WBC 13.3 (H) 4.0 - 10.5 K/uL   RBC 3.08 (L) 3.87 - 5.11 MIL/uL   Hemoglobin 9.3 (L) 12.0 - 15.0 g/dL   HCT 28.4 (L) 36.0 - 46.0 %   MCV 92.2 78.0 - 100.0 fL   MCH 30.2 26.0 - 34.0 pg   MCHC 32.7 30.0 - 36.0 g/dL   RDW 16.4 (H) 11.5 - 15.5 %   Platelets 521 (H) 150 - 400 K/uL  Renal function panel     Status: Abnormal   Collection Time: 12/14/13  2:20 PM  Result Value Ref Range   Sodium 127 (L) 137 - 147 mEq/L   Potassium 4.2 3.7 - 5.3 mEq/L   Chloride 85 (L) 96 -  112 mEq/L   CO2 26 19 - 32 mEq/L   Glucose, Bld 357 (H) 70 - 99 mg/dL   BUN 33 (H) 6 - 23 mg/dL   Creatinine, Ser 3.60 (H) 0.50 - 1.10 mg/dL   Calcium 9.1 8.4 - 10.5 mg/dL   Phosphorus 2.2 (L) 2.3 - 4.6 mg/dL   Albumin 2.2 (L) 3.5 - 5.2 g/dL   GFR calc non Af Amer 12 (L) >90 mL/min   GFR calc Af Amer 14 (  L) >90 mL/min    Comment: (NOTE) The eGFR has been calculated using the CKD EPI equation. This calculation has not been validated in all clinical situations. eGFR's persistently <90 mL/min signify possible Chronic Kidney Disease.    Anion gap 16 (H) 5 - 15  Basic metabolic panel     Status: Abnormal   Collection Time: 12/15/13  7:25 AM  Result Value Ref Range   Sodium 133 (L) 137 - 147 mEq/L   Potassium 4.3 3.7 - 5.3 mEq/L   Chloride 93 (L) 96 - 112 mEq/L   CO2 27 19 - 32 mEq/L   Glucose, Bld 162 (H) 70 - 99 mg/dL   BUN 23 6 - 23 mg/dL   Creatinine, Ser 2.50 (H) 0.50 - 1.10 mg/dL   Calcium 9.0 8.4 - 10.5 mg/dL   GFR calc non Af Amer 18 (L) >90 mL/min   GFR calc Af Amer 21 (L) >90 mL/min    Comment: (NOTE) The eGFR has been calculated using the CKD EPI equation. This calculation has not been validated in all clinical situations. eGFR's persistently <90 mL/min signify possible Chronic Kidney Disease.    Anion gap 13 5 - 15  CBC     Status: Abnormal   Collection Time: 12/15/13  7:25 AM  Result Value Ref Range   WBC 12.6 (H) 4.0 - 10.5 K/uL   RBC 3.31 (L) 3.87 - 5.11 MIL/uL   Hemoglobin 9.7 (L) 12.0 - 15.0 g/dL   HCT 31.0 (L) 36.0 - 46.0 %   MCV 93.7 78.0 - 100.0 fL   MCH 29.3 26.0 - 34.0 pg   MCHC 31.3 30.0 - 36.0 g/dL   RDW 16.5 (H) 11.5 - 15.5 %   Platelets 454 (H) 150 - 400 K/uL  Glucose, capillary     Status: Abnormal   Collection Time: 12/15/13  7:48 AM  Result Value Ref Range   Glucose-Capillary 157 (H) 70 - 99 mg/dL  Glucose, capillary     Status: Abnormal   Collection Time: 12/15/13 11:52 AM  Result Value Ref Range   Glucose-Capillary 276 (H) 70 - 99 mg/dL   Protein electrophoresis, urine     Status: Abnormal   Collection Time: 12/15/13  3:59 PM  Result Value Ref Range   Time RANDOM hours    Comment: CORRECTED ON 11/09 AT 1510: PREVIOUSLY REPORTED AS URINE, RANDOM, CORRECTED ON 11/08 AT 1607: PREVIOUSLY REPORTED AS 24   Volume, Urine RANDOM mL    Comment: CORRECTED ON 11/09 AT 1510: PREVIOUSLY REPORTED AS URINE, RANDOM   Total Protein, Urine 233 (H) 5 - 24 mg/dL    Comment: (NOTE) No established reference range. Result confirmed by automatic dilution.    Total Protein, Urine-Ur/day NOT CALC <150 mg/day    Comment: (NOTE) Total urinary protein is determined by adding the albumin and Kappa and/or Lambda light chains.  This value may not agree with the total protein as determined by chemical methods, which characteristically underestimate urinary light chains. Performed at [facility]    Albumin, U PENDING    Alpha 1, Urine PENDING    Alpha 2, Urine PENDING    Beta, Urine PENDING    Gamma Globulin, Urine PENDING    Immunofixation, Urine PENDING   Glucose, capillary     Status: Abnormal   Collection Time: 12/15/13  5:22 PM  Result Value Ref Range   Glucose-Capillary 397 (H) 70 - 99 mg/dL  Basic metabolic panel     Status: Abnormal  Collection Time: 12/16/13  6:15 AM  Result Value Ref Range   Sodium 132 (L) 137 - 147 mEq/L   Potassium 4.5 3.7 - 5.3 mEq/L   Chloride 90 (L) 96 - 112 mEq/L   CO2 26 19 - 32 mEq/L   Glucose, Bld 144 (H) 70 - 99 mg/dL   BUN 37 (H) 6 - 23 mg/dL   Creatinine, Ser 3.22 (H) 0.50 - 1.10 mg/dL   Calcium 9.1 8.4 - 10.5 mg/dL   GFR calc non Af Amer 13 (L) >90 mL/min   GFR calc Af Amer 16 (L) >90 mL/min    Comment: (NOTE) The eGFR has been calculated using the CKD EPI equation. This calculation has not been validated in all clinical situations. eGFR's persistently <90 mL/min signify possible Chronic Kidney Disease.    Anion gap 16 (H) 5 - 15  Glucose, capillary     Status: Abnormal   Collection  Time: 12/16/13  8:05 AM  Result Value Ref Range   Glucose-Capillary 134 (H) 70 - 99 mg/dL  Glucose, capillary     Status: Abnormal   Collection Time: 12/16/13 11:42 AM  Result Value Ref Range   Glucose-Capillary 248 (H) 70 - 99 mg/dL  Glucose, capillary     Status: Abnormal   Collection Time: 12/16/13  4:53 PM  Result Value Ref Range   Glucose-Capillary 270 (H) 70 - 99 mg/dL  Glucose, capillary     Status: Abnormal   Collection Time: 12/16/13  8:42 PM  Result Value Ref Range   Glucose-Capillary 288 (H) 70 - 99 mg/dL  Glucose, capillary     Status: Abnormal   Collection Time: 12/17/13  9:01 AM  Result Value Ref Range   Glucose-Capillary 261 (H) 70 - 99 mg/dL   Labs are reviewed.  Current Facility-Administered Medications  Medication Dose Route Frequency Provider Last Rate Last Dose  . acetaminophen (TYLENOL) tablet 650 mg  650 mg Oral Q6H PRN Toy Baker, MD   650 mg at 12/14/13 1513   Or  . acetaminophen (TYLENOL) suppository 650 mg  650 mg Rectal Q6H PRN Toy Baker, MD      . albuterol (PROVENTIL) (2.5 MG/3ML) 0.083% nebulizer solution 3 mL  3 mL Inhalation Q6H PRN Toy Baker, MD      . ALPRAZolam Duanne Moron) tablet 0.125 mg  0.125 mg Oral Daily PRN Annita Brod, MD   0.125 mg at 12/16/13 2127  . amLODipine (NORVASC) tablet 10 mg  10 mg Oral Daily Oswald Hillock, MD   10 mg at 12/16/13 1017  . aspirin EC tablet 81 mg  81 mg Oral Daily Toy Baker, MD   81 mg at 12/16/13 1015  . atorvastatin (LIPITOR) tablet 40 mg  40 mg Oral QHS Toy Baker, MD   40 mg at 12/16/13 2129  . baclofen (LIORESAL) tablet 10 mg  10 mg Oral Q12H PRN Annita Brod, MD   10 mg at 12/16/13 1634  . collagenase (SANTYL) ointment   Topical Daily Annita Brod, MD      . Darbepoetin Alfa (ARANESP) injection 50 mcg  50 mcg Intravenous Q Tue-HD Marlena Clipper, NP   50 mcg at 12/10/13 1050  . dexamethasone (DECADRON) injection 4 mg  4 mg Intravenous Q12H Oswald Hillock, MD   4 mg at 12/16/13 2141  . dextrose (GLUTOSE) 40 % oral gel 37.5 g  1 Tube Oral PRN Annita Brod, MD   37.5 g at 12/07/13 1655  .  docusate sodium (COLACE) capsule 100 mg  100 mg Oral BID Toy Baker, MD   100 mg at 12/16/13 2127  . DULoxetine (CYMBALTA) DR capsule 30 mg  30 mg Oral Daily Brock Ra Lampkin, DO   30 mg at 12/16/13 1015  . feeding supplement (NEPRO CARB STEADY) liquid 237 mL  237 mL Oral Q1500 Kallie Locks, RD   237 mL at 12/16/13 1528  . feeding supplement (RESOURCE BREEZE) (RESOURCE BREEZE) liquid 1 Container  1 Container Oral BID BM Kallie Locks, RD   1 Container at 12/16/13 1739  . fentaNYL (DURAGESIC - dosed mcg/hr) patch 25 mcg  25 mcg Transdermal Q72H Acquanetta Chain, DO   25 mcg at 12/14/13 0950  . gabapentin (NEURONTIN) capsule 100 mg  100 mg Oral Once per day on Mon Wed Fri Brock Ra Lampkin, DO   100 mg at 12/16/13 8938  . hydrALAZINE (APRESOLINE) injection 10 mg  10 mg Intravenous Q4H PRN Toy Baker, MD      . HYDROmorphone (DILAUDID) tablet 2 mg  2 mg Oral Q2H PRN Acquanetta Chain, DO   2 mg at 12/17/13 0857  . insulin aspart (novoLOG) injection 0-15 Units  0-15 Units Subcutaneous TID WC Oswald Hillock, MD   8 Units at 12/17/13 0908  . insulin glargine (LANTUS) injection 20 Units  20 Units Subcutaneous QHS Oswald Hillock, MD   20 Units at 12/16/13 2138  . methimazole (TAPAZOLE) tablet 20 mg  20 mg Oral TID Annita Brod, MD   20 mg at 12/16/13 2128  . metoprolol tartrate (LOPRESSOR) tablet 25 mg  25 mg Oral BID Toy Baker, MD   25 mg at 12/16/13 2127  . multivitamin (RENA-VIT) tablet 1 tablet  1 tablet Oral QHS Marlena Clipper, NP   1 tablet at 12/16/13 2128  . ondansetron (ZOFRAN) injection 4 mg  4 mg Intravenous Q6H PRN Toy Baker, MD      . ondansetron (ZOFRAN) tablet 4 mg  4 mg Oral Q8H PRN Acquanetta Chain, DO   4 mg at 12/15/13 2033  . pantoprazole (PROTONIX) EC tablet 40 mg  40 mg Oral Daily Toy Baker, MD   40 mg at 12/16/13 1021  . polyethylene glycol (MIRALAX / GLYCOLAX) packet 17 g  17 g Oral Daily Annita Brod, MD   17 g at 12/16/13 1022  . ranolazine (RANEXA) 12 hr tablet 500 mg  500 mg Oral BID Toy Baker, MD   500 mg at 12/16/13 2127  . senna-docusate (Senokot-S) tablet 2 tablet  2 tablet Oral QHS Acquanetta Chain, DO   2 tablet at 12/16/13 2128  . sodium chloride 0.9 % injection 3 mL  3 mL Intravenous Q12H Toy Baker, MD   3 mL at 12/16/13 2129  . traZODone (DESYREL) tablet 25 mg  25 mg Oral QHS Acquanetta Chain, DO   25 mg at 12/16/13 2128    Psychiatric Specialty Exam: Physical Exam as per history and physical  Review of Systems  Constitutional: Positive for malaise/fatigue.  Gastrointestinal: Positive for nausea.  Musculoskeletal: Positive for myalgias and back pain.  Neurological: Positive for weakness.  Psychiatric/Behavioral: Positive for depression and memory loss. The patient is nervous/anxious and has insomnia.     Blood pressure 180/73, pulse 80, temperature 97.6 F (36.4 C), temperature source Axillary, resp. rate 16, height _0  (1.702 m), weight 68.8 kg (151 lb 10.8 oz), SpO2 99 %.Body mass index is 23.75 kg/(m^2).  General Appearance:  Guarded  Eye Contact::  Fair  Speech:  Blocked, Clear and Coherent and Slow  Volume:  Decreased  Mood:  Anxious and Worthless  Affect:  Depressed and Flat  Thought Process:  Coherent and Goal Directed  Orientation:  Full (Time, Place, and Person)  Thought Content:  WDL  Suicidal Thoughts:  No  Homicidal Thoughts:  No  Memory:  Immediate;   Fair Recent;   Poor  Judgement:  Impaired  Insight:  Lacking  Psychomotor Activity:  Psychomotor Retardation  Concentration:  Fair  Recall:  Poor  Fund of Knowledge:Fair  Language: Good  Akathisia:  NA  Handed:  Right  AIMS (if indicated):     Assets:  Agricultural consultant Housing Intimacy Leisure Time Social  Support  Sleep:      Musculoskeletal: Strength & Muscle Tone: decreased Gait & Station: unable to stand Patient leans: N/A  Treatment Plan Summary: Daily contact with patient to assess and evaluate symptoms and progress in treatment Medication management  Patient does not meet criteria for capacity to make her own medical decisions based on my evaluation today. Discontinue Air cabin crew as she has no suicidal ideation and or other safety concerns at this time.   Holly Ellison,Holly R. 12/17/2013 9:58 AM

## 2013-12-17 NOTE — Progress Notes (Signed)
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 Palliative care has seen the patient and started on Decadron to reduce the cord edema as well as better pain control.we'll change the Decadron to 4 mg IV every 12 hours. CT guided biopsy today as per ID recommendation to r/o possible infectious cause.  2. Chronic back pain- patient has chronic back pain from the progressive pathologic fracture of the T10-11 vertebrae, continue baclofen 10 mg every 12 hours when necessary,fentanyl patch has been increased to 25 g every 72 hours, Neurontin 100 mg 3 times weekly. Started back on Cymbalta, Topamax discontinued.  3. Pleural effusion- CT chest showed large right pleural effusion, thoracentesis was performed and cytology reveals mixed inflammatory cells, reactive appearing mesothelial cells.pulmonary is following, and recommended repeat thoracentesis with a repeat cytology. Will obtain repeat  thoracentesis and again send the fluid for cytology.  4. Hypertension- patient's blood pressure is elevated, despite taking 2 medications including amlodipine and metoprolol. Will increase the dose of amlodipine to 10 mg by mouth daily and increase the metoprolol, to 50 mg po BID.  5. Suicidal ideation- patient complained of suicidal ideation  one is to one sitter was placed, and psych consult obtained.Psych has cleared her, no suicidal ideation and discontinued the sitter.  6. End-stage renal disease- patient is currently on hemodialysis, she had earlier refused hemodialysis.Later after Psych and palliative care deemed her incompetent, she is now back on  hemodialysis.  7. Hyperthyroidism- Continue Tapazole, the dose has now increased to 20 mg po TID  8. Diabetes Mellitus-blood glucose is now elevated due to Decadron, sliding-scale insulin with novolog and alsochange the Lantus to 20 units subcutaneous daily.  9. ? Underlying malignancy- All the workup for malignancy so far is negative including the cytology from the pleural fluid,CT chest, abdomen and pelvis were obtained which did not show any tumor or metastasis. I called and discussed with oncology on call Dr. Alen Blew, and he recommends to obtain his SPEP and UPEP to rule out underlying myeloma. Will order SPEP and UPEP today.    Code Status: Full code Family Communication: discussed with patient's son, Dr. Rhea Ellison. As per palliative care patient does not have capacity to make decisions, so we'll have to follow patient's son's wishes for further procedures. Disposition Plan:  SNF   Consultants:  Nephrology  Neurology   Palliative care  Procedures:  Thoracentesis  Antibiotics:  None  HPI/Subjective: 73 year old female with past medical history of end-stage renal disease and intermittent compliance issues with dialysis who earlier this year underwent a decompressive thoracic laminectomy for unclear spinal process which was causing cord compression and edema presented to the emergency room on 10/30 with complaints of worsening shortness of breath times several days plus progression and loss of her right sided extremities. MRI thoracic spine demonstrated further spinal cord compression of vertebral destruction and seen by neurosurgery who felt that she would not be an operative candidate. On chest x-ray patient also noted to have large left-sided pleural effusion not present last month on x-ray. Initially when she was admitted, patient's mentation in question. Patient admitted to the hospitalist service. Nephrology consulted Seen by pulmonary and interventional radiology and  underwent thoracentesis of the right lung  with 1.2 L of fluid removed. Patient has also been followed by palliative care but deciding on goals of care has been complicated due to family issues and patient's wavering mentation which may or may not be chronic.  Patient seen and examined, no new complaints. No new complaints at this time.   Objective: Filed Vitals:   12/17/13 1639  BP: 163/60  Pulse: 84  Temp: 98.6 F (37 C)  Resp: 14    Intake/Output Summary (Last 24 hours) at 12/17/13 1731 Last data filed at 12/17/13 1647  Gross per 24 hour  Intake    920 ml  Output    544 ml  Net    376 ml   Filed Weights   12/14/13 2019 12/15/13 2037 12/17/13 0613  Weight: 67.6 kg (149 lb 0.5 oz) 67.4 kg (148 lb 9.4 oz) 68.8 kg (151 lb 10.8 oz)    Exam:  Physical Exam: Head: Normocephalic, atraumatic.  Eyes: No signs of jaundice, EOMI Lungs: Normal respiratory effort. B/L Clear to auscultation, no crackles or wheezes.  Heart: Regular RR. S1 and S2 normal  Abdomen: BS normoactive. Soft, Nondistended, non-tender.  Extremities: No pretibial edema, no erythema   Data Reviewed: Basic Metabolic Panel:  Recent Labs Lab 12/12/13 1000 12/14/13 1420 12/15/13 0725 12/16/13 0615  NA 132* 127* 133* 132*  K 4.6 4.2 4.3 4.5  CL 90* 85* 93* 90*  CO2 25 26 27 26   GLUCOSE 181* 357* 162* 144*  BUN 25* 33* 23 37*  CREATININE 4.06* 3.60* 2.50* 3.22*  CALCIUM 9.0 9.1 9.0 9.1  PHOS  --  2.2*  --   --    Liver Function Tests:  Recent Labs Lab 12/12/13 1000 12/14/13 1420  AST 29  --   ALT 28  --   ALKPHOS 160*  --   BILITOT 0.4  --   PROT 7.2  --   ALBUMIN 2.1* 2.2*   No results for input(s): LIPASE, AMYLASE in the last 168 hours. No results for input(s): AMMONIA in the last 168 hours. CBC:  Recent Labs Lab 12/12/13 1000 12/14/13 1419 12/15/13 0725 12/17/13 0947  WBC 11.5* 13.3* 12.6* 16.6*  HGB 9.3* 9.3* 9.7* 10.4*  HCT 29.7* 28.4* 31.0* 31.3*  MCV 94.9 92.2 93.7 92.3  PLT  459* 521* 454* 487*   Cardiac Enzymes: No results for input(s): CKTOTAL, CKMB, CKMBINDEX, TROPONINI in the last 168 hours. BNP (last 3 results) No results for input(s): PROBNP in the last 8760 hours. CBG:  Recent Labs Lab 12/16/13 2042 12/17/13 0901 12/17/13 1250 12/17/13 1417 12/17/13 1639  GLUCAP 288* 261* 121* 97 282*    Recent Results (from the past 240 hour(s))  Fungus Culture with Smear     Status: None (Preliminary result)   Collection Time: 12/09/13  1:28 PM  Result Value Ref Range Status   Specimen Description FLUID RIGHT PLEURAL  Final   Special Requests NONE  Final   Fungal Smear   Final    NO YEAST OR FUNGAL ELEMENTS SEEN Performed at [facility]    Culture   Final    CULTURE IN PROGRESS FOR FOUR WEEKS Performed at [facility]    Report Status PENDING  Incomplete  Body fluid culture     Status: None   Collection Time: 12/09/13  1:28 PM  Result Value Ref Range Status   Specimen Description FLUID RIGHT PLEURAL  Final   Special Requests NONE  Final   Gram Stain   Final  RARE WBC PRESENT, PREDOMINANTLY MONONUCLEAR NO ORGANISMS SEEN Performed at [facility]    Culture   Final    NO GROWTH 3 DAYS Performed at [facility]    Report Status 12/13/2013 FINAL  Final  AFB culture with smear     Status: None (Preliminary result)   Collection Time: 12/09/13  1:28 PM  Result Value Ref Range Status   Specimen Description FLUID RIGHT PLEURAL  Final   Special Requests NONE  Final   Acid Fast Smear   Final    NO ACID FAST BACILLI SEEN Performed at [facility]    Culture   Final    CULTURE WILL BE EXAMINED FOR 6 WEEKS BEFORE ISSUING A FINAL REPORT Performed at [facility]    Report Status PENDING  Incomplete     Studies: No results found.  Scheduled Meds: . amLODipine  10 mg Oral Daily  . aspirin EC  81 mg Oral Daily  . atorvastatin  40 mg Oral QHS  . collagenase   Topical Daily  . darbepoetin  (ARANESP) injection - DIALYSIS  50 mcg Intravenous Q Tue-HD  . dexamethasone  4 mg Intravenous Q12H  . docusate sodium  100 mg Oral BID  . DULoxetine  30 mg Oral Daily  . feeding supplement (NEPRO CARB STEADY)  237 mL Oral Q1500  . feeding supplement (RESOURCE BREEZE)  1 Container Oral BID BM  . fentaNYL  25 mcg Transdermal Q72H  . gabapentin  100 mg Oral Once per day on Mon Wed Fri  . insulin aspart  0-15 Units Subcutaneous TID WC  . insulin glargine  20 Units Subcutaneous QHS  . methimazole  20 mg Oral TID  . metoprolol tartrate  25 mg Oral BID  . multivitamin  1 tablet Oral QHS  . pantoprazole  40 mg Oral Daily  . polyethylene glycol  17 g Oral Daily  . ranolazine  500 mg Oral BID  . senna-docusate  2 tablet Oral QHS  . sodium chloride  3 mL Intravenous Q12H  . traZODone  25 mg Oral QHS   Continuous Infusions: . 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 If 7PM-7AM, please contact night-coverage at [email or web address], password [facility] 12/17/2013, 5:31 PM  LOS: 11 days

## 2013-12-17 NOTE — Sedation Documentation (Signed)
Patient denies pain and is resting comfortably.  

## 2013-12-17 NOTE — Progress Notes (Signed)
Subjective:  Co back  Pain / sitter in room/ For IR  T10 bx  Today/ HD today  Objective Vital signs in last 24 hours: Filed Vitals:   12/16/13 1745 12/16/13 1809 12/16/13 2044 12/17/13 0613  BP: 179/68 180/70 179/73 180/73  Pulse:  92 88 80  Temp:  99.1 F (37.3 C) 98.2 F (36.8 C) 97.6 F (36.4 C)  TempSrc:  Oral Oral Axillary  Resp:   16 16  Height:      Weight:    68.8 kg (151 lb 10.8 oz)  SpO2:   98% 99%   Weight change: 1.4 kg (3 lb 1.4 oz) Physical Exam: General: Alert, NAD/more confused  This am, / sitter in room  Heart: RRR, no rub, mur or gallop Lungs:  cta Abdomen: BS pos, distended but nontender Extremities: L foot in boot lower leg trace edema/ no R pedal edema Dialysis Access: L UArm Avf pos. bruit   HD: T[facility] [place] 3h 54min 67.5kg 2/2.25 [place] [facility] AVF Heparin 4500 Aranesp 50 ug q thurs, no other meds  Problem/Plan: 1. 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 Blood cultures no growth. Head CT no acute abnormalitity. ID , Dr. Lucianne Lei dam seeing recommend T10 bx to ro infetious process /On IV Steroids Dexamethasone 4mg  bid for chord edema/ Was living at [facility] prior to admit for past several months with Harrel Lemon lift in OP ASH, HD unit 2. Large Right Pleural Effusion- Pulmonary following, S/p thoracentesis .cytology had revealed mixed inflammatory cells, reactive appearing mesothelial cells 3 ESRD - TTS [place], HD today/ NEEDS  To sit in [place] for op HD/Had refused HD and now agrees to continue ( "was feeling bad in Lots of pain, not ready to stop HD") 4. Anemia - hgb 9.7- aranesp 50 q Tues HD week. No Fe.  5. Secondary hyperparathyroidism - phos 5.2>2.2 Will dc Fosrenal, poor appetite , watch Phos. Ca+ 10.4 corrected, No vit d  7. HTN/volume - Bp up 180/71 for hd today and  ON amlodipine and metop. 25mg  bid/  Amlodipine incr 5mg  to 10mg /At dry wt/ CXR Sat showing "resolving edema  and Pl effusion near resolution" 8 Nutrition - Supplements. Multivit. Renal diet  Ernest Haber, PA-C [facility] Beeper [phone] 12/17/2013,9:39 AM  LOS: 11 days  I have seen and examined this patient and agree with plan per Ernest Haber.  No new CO.  To go for thoracic Asp/Bx today and HD.  Gaylin Bulthuis T,MD 12/17/2013 10:25 AM Labs: Basic Metabolic Panel:  Recent Labs Lab 12/14/13 1420 12/15/13 0725 12/16/13 0615  NA 127* 133* 132*  K 4.2 4.3 4.5  CL 85* 93* 90*  CO2 26 27 26   GLUCOSE 357* 162* 144*  BUN 33* 23 37*  CREATININE 3.60* 2.50* 3.22*  CALCIUM 9.1 9.0 9.1  PHOS 2.2*  --   --    Liver Function Tests:  Recent Labs Lab 12/12/13 1000 12/14/13 1420  AST 29  --   ALT 28  --   ALKPHOS 160*  --   BILITOT 0.4  --   PROT 7.2  --   ALBUMIN 2.1* 2.2*   No results for input(s): LIPASE, AMYLASE in the last 168 hours. No results for input(s): AMMONIA in the last 168 hours. CBC:  Recent Labs Lab 12/12/13 1000 12/14/13 1419 12/15/13 0725  WBC 11.5* 13.3* 12.6*  HGB 9.3* 9.3* 9.7*  HCT 29.7* 28.4* 31.0*  MCV 94.9 92.2  93.7  PLT 459* 521* 454*   Cardiac Enzymes: No results for input(s): CKTOTAL, CKMB, CKMBINDEX, TROPONINI in the last 168 hours. CBG:  Recent Labs Lab 12/16/13 0805 12/16/13 1142 12/16/13 1653 12/16/13 2042 12/17/13 0901  GLUCAP 134* 248* 270* 288* 261*    Studies/Results: No results found. Medications:   . amLODipine  10 mg Oral Daily  . aspirin EC  81 mg Oral Daily  . atorvastatin  40 mg Oral QHS  . collagenase   Topical Daily  . darbepoetin (ARANESP) injection - DIALYSIS  50 mcg Intravenous Q Tue-HD  . dexamethasone  4 mg Intravenous Q12H  . docusate sodium  100 mg Oral BID  . DULoxetine  30 mg Oral Daily  . feeding supplement (NEPRO CARB STEADY)  237 mL Oral Q1500  . feeding supplement (RESOURCE BREEZE)  1 Container Oral BID BM  . fentaNYL  25 mcg Transdermal Q72H  . gabapentin  100 mg Oral Once  per day on Mon Wed Fri  . insulin aspart  0-15 Units Subcutaneous TID WC  . insulin glargine  20 Units Subcutaneous QHS  . methimazole  20 mg Oral TID  . metoprolol tartrate  25 mg Oral BID  . multivitamin  1 tablet Oral QHS  . pantoprazole  40 mg Oral Daily  . polyethylene glycol  17 g Oral Daily  . ranolazine  500 mg Oral BID  . senna-docusate  2 tablet Oral QHS  . sodium chloride  3 mL Intravenous Q12H  . traZODone  25 mg Oral QHS

## 2013-12-17 NOTE — Procedures (Signed)
S/P T 10 fluoro guided  Bone biopsy

## 2013-12-17 NOTE — Plan of Care (Signed)
Problem: Phase II Progression Outcomes Goal: Pain controlled Outcome: Completed/Met Date Met:  12/17/13 Goal: Tolerating diet Outcome: Completed/Met Date Met:  12/17/13

## 2013-12-18 LAB — GLUCOSE, CAPILLARY
GLUCOSE-CAPILLARY: 313 mg/dL — AB (ref 70–99)
Glucose-Capillary: 155 mg/dL — ABNORMAL HIGH (ref 70–99)
Glucose-Capillary: 375 mg/dL — ABNORMAL HIGH (ref 70–99)
Glucose-Capillary: 350 mg/dL — ABNORMAL HIGH (ref 70–99)
Glucose-Capillary: 327 mg/dL — ABNORMAL HIGH (ref 70–99)

## 2013-12-18 LAB — COMPREHENSIVE METABOLIC PANEL
ALBUMIN: 2.2 g/dL — AB (ref 3.5–5.2)
ALT: 103 U/L — AB (ref 0–35)
AST: 51 U/L — ABNORMAL HIGH (ref 0–37)
Alkaline Phosphatase: 157 U/L — ABNORMAL HIGH (ref 39–117)
Anion gap: 17 — ABNORMAL HIGH (ref 5–15)
BUN: 34 mg/dL — ABNORMAL HIGH (ref 6–23)
CO2: 26 mEq/L (ref 19–32)
Calcium: 8.8 mg/dL (ref 8.4–10.5)
Chloride: 91 mEq/L — ABNORMAL LOW (ref 96–112)
Creatinine, Ser: 2.64 mg/dL — ABNORMAL HIGH (ref 0.50–1.10)
GFR calc non Af Amer: 17 mL/min — ABNORMAL LOW (ref >90)
GFR, EST AFRICAN AMERICAN: 20 mL/min — AB (ref >90)
Glucose, Bld: 360 mg/dL — ABNORMAL HIGH (ref 70–99)
Potassium: 4.5 mEq/L (ref 3.7–5.3)
SODIUM: 134 meq/L — AB (ref 137–147)
TOTAL PROTEIN: 6.6 g/dL (ref 6.0–8.3)
Total Bilirubin: 0.3 mg/dL (ref 0.3–1.2)

## 2013-12-18 LAB — CBC
HCT: 32.2 % — ABNORMAL LOW (ref 36.0–46.0)
Hemoglobin: 10.2 g/dL — ABNORMAL LOW (ref 12.0–15.0)
MCH: 29.8 pg (ref 26.0–34.0)
MCHC: 31.7 g/dL (ref 30.0–36.0)
MCV: 94.2 fL (ref 78.0–100.0)
Platelets: 452 10*3/uL — ABNORMAL HIGH (ref 150–400)
RBC: 3.42 MIL/uL — ABNORMAL LOW (ref 3.87–5.11)
RDW: 16.9 % — AB (ref 11.5–15.5)
WBC: 18 10*3/uL — ABNORMAL HIGH (ref 4.0–10.5)

## 2013-12-18 LAB — HEPATITIS B SURFACE ANTIGEN: Hepatitis B Surface Ag: NEGATIVE

## 2013-12-18 LAB — HEPATITIS B SURFACE ANTIBODY,QUALITATIVE: Hep B S Ab: POSITIVE — AB

## 2013-12-18 MED ORDER — VANCOMYCIN HCL 10 G IV SOLR
1500.0000 mg | Freq: Once | INTRAVENOUS | Status: AC
  Administered 2013-12-18: 1500 mg via INTRAVENOUS
  Filled 2013-12-18: qty 1500

## 2013-12-18 MED ORDER — DEXAMETHASONE 4 MG PO TABS
4.0000 mg | ORAL_TABLET | Freq: Every day | ORAL | Status: DC
  Administered 2013-12-19: 4 mg via ORAL
  Filled 2013-12-18: qty 1

## 2013-12-18 NOTE — Progress Notes (Addendum)
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 Palliative care has seen the patient and started on Decadron to reduce the cord edema as well as better pain control.we'll change the Decadron to 4 mg oral daily, CT guided biopsy 12/17/13  as per ID recommendation to r/o possible infectious cause.folllow on culture.  Leukocytosis -Patient is a febrile, this is most likely related to steroid use.  Chronic back pain - patient has chronic back pain from the progressive pathologic fracture of the T10-11 vertebrae, continue baclofen 10 mg every 12 hours when necessary,fentanyl patch has been increased to 25 g every 72 hours, Neurontin 100 mg 3 times weekly. Continue with Decadron.Started back on Cymbalta, Topamax discontinued.  Pleural effusion - CT chest showed large right pleural effusion, thoracentesis was performed and cytology reveals mixed       inflammatory cells, reactive appearing mesothelial cells, not likely to be TB, MTB and edema seen DM and adenosine deaminase are pending,ID on board.  Hypertension - patient's blood pressure is elevated, despite taking 2 medications including amlodipine and metoprolol. Will increase the dose of amlodipine to 10 mg by mouth daily and increase the metoprolol, to 50 mg po BID.  Suicidal ideation - patient complained of suicidal ideation  one is to one sitter was placed, and psych consult obtained.Psych has cleared her, no suicidal ideation and discontinued the sitter.  End-stage renal disease - patient is currently on hemodialysis, she had earlier refused hemodialysis.Later after Psych and palliative care  deemed her incompetent, she is now back on hemodialysis.  Hyperthyroidism - Continue Tapazole, the dose has now increased to 20 mg po TID  Diabetes Mellitus -blood glucose is now elevated due to Decadron,Tapering down Decadron, will hold on increasing patient Lantus sliding-scale insulin with novolog and also change the Lantus to 20 units subcutaneous daily.  ? Underlying malignancy - All the workup for malignancy so far is negative including the cytology from the pleural fluid,CT chest, abdomen and pelvis were obtained which did not show any tumor or metastasis. I called and discussed with oncology on call Dr. Alen Blew, and he recommends to obtain his SPEP and UPEP to rule out underlying myeloma. Will order SPEP and UPEP today. -UPEP is abnormalis abnormal , but awaiting for SPEP results.   Code Status: Full code Family Communication: discussed with patient's son over the phone, Dr. [name]. As per palliative care patient does not have capacity to make decisions, so we'll have to follow patient's son's wishes for further procedures. Disposition Plan:  SNF   Consultants:  Nephrology  Neurology   Palliative care       Infectious disease Procedures:  Thoracentesis       CT-guided biopsy 12/17/13 Antibiotics:  None  HPI/Subjective: 73 year old female with past medical history of end-stage renal disease and intermittent compliance issues with dialysis who earlier this year underwent a decompressive thoracic laminectomy for unclear spinal process which was causing cord compression and edema presented to the emergency room on 10/30 with complaints of worsening shortness of breath times several days plus progression and loss of her right sided extremities. MRI thoracic spine demonstrated further spinal cord compression of vertebral destruction and seen by neurosurgery who felt that she  would not be an operative candidate. On chest x-ray patient also noted to have large left-sided  pleural effusion not present last month on x-ray. Initially when she was admitted, patient's mentation in question. Patient admitted to the hospitalist service. Nephrology consulted Seen by pulmonary and interventional radiology and underwent thoracentesis of the right lung with 1.2 L of fluid removed. Patient has also been followed by palliative care but deciding on goals of care has been complicated due to family issues and patient's wavering mentation which may or may not be chronic.  Patient seen and examined, no new complaints. No new complaints at this time.   Objective: Filed Vitals:   12/18/13 1041  BP:   Pulse: 88  Temp:   Resp:     Intake/Output Summary (Last 24 hours) at 12/18/13 1224 Last data filed at 12/17/13 2123  Gross per 24 hour  Intake    600 ml  Output   2341 ml  Net  -1741 ml   Filed Weights   12/17/13 9449 12/17/13 1745 12/17/13 2123  Weight: 68.8 kg (151 lb 10.8 oz) 70.1 kg (154 lb 8.7 oz) 65.9 kg (145 lb 4.5 oz)    Exam:  Physical Exam: Head: Normocephalic, atraumatic.  Eyes: No signs of jaundice, EOMI Lungs: Normal respiratory effort. B/L Clear to auscultation, no crackles or wheezes.  Heart: Regular RR. S1 and S2 normal  Abdomen: BS normoactive. Soft, Nondistended, non-tender.  Extremities: No pretibial edema, no erythema   Data Reviewed: Basic Metabolic Panel:  Recent Labs Lab 12/14/13 1420 12/15/13 0725 12/16/13 0615 12/17/13 2258 12/18/13 0543  NA 127* 133* 132* 136* 134*  K 4.2 4.3 4.5 3.7 4.5  CL 85* 93* 90* 92* 91*  CO2 26 27 26 27 26   GLUCOSE 357* 162* 144* 209* 360*  BUN 33* 23 37* 24* 34*  CREATININE 3.60* 2.50* 3.22* 2.23* 2.64*  CALCIUM 9.1 9.0 9.1 8.8 8.8  PHOS 2.2*  --   --  2.2*  --    Liver Function Tests:  Recent Labs Lab 12/12/13 1000 12/14/13 1420 12/17/13 2258 12/18/13 0543  AST 29  --   --  51*  ALT 28  --   --  103*  ALKPHOS 160*  --   --  157*  BILITOT 0.4  --   --  0.3  PROT 7.2  --   --  6.6   ALBUMIN 2.1* 2.2* 2.3* 2.2*   No results for input(s): LIPASE, AMYLASE in the last 168 hours. No results for input(s): AMMONIA in the last 168 hours. CBC:  Recent Labs Lab 12/12/13 1000 12/14/13 1419 12/15/13 0725 12/17/13 0947 12/18/13 0543  WBC 11.5* 13.3* 12.6* 16.6* 18.0*  HGB 9.3* 9.3* 9.7* 10.4* 10.2*  HCT 29.7* 28.4* 31.0* 31.3* 32.2*  MCV 94.9 92.2 93.7 92.3 94.2  PLT 459* 521* 454* 487* 452*   Cardiac Enzymes: No results for input(s): CKTOTAL, CKMB, CKMBINDEX, TROPONINI in the last 168 hours. BNP (last 3 results) No results for input(s): PROBNP in the last 8760 hours. CBG:  Recent Labs Lab 12/17/13 1936 12/17/13 2118 12/17/13 2155 12/18/13 0751 12/18/13 1153  GLUCAP 206* 155* 158* 375* 350*    Recent Results (from the past 240 hour(s))  Fungus Culture with Smear     Status: None (Preliminary result)   Collection Time: 12/09/13  1:28 PM  Result Value Ref Range Status   Specimen Description FLUID RIGHT PLEURAL  Final   Special Requests NONE  Final   Fungal Smear  Final    NO YEAST OR FUNGAL ELEMENTS SEEN Performed at [facility]    Culture   Final    CULTURE IN PROGRESS FOR FOUR WEEKS Performed at [facility]    Report Status PENDING  Incomplete  Body fluid culture     Status: None   Collection Time: 12/09/13  1:28 PM  Result Value Ref Range Status   Specimen Description FLUID RIGHT PLEURAL  Final   Special Requests NONE  Final   Gram Stain   Final    RARE WBC PRESENT, PREDOMINANTLY MONONUCLEAR NO ORGANISMS SEEN Performed at [facility]    Culture   Final    NO GROWTH 3 DAYS Performed at [facility]    Report Status 12/13/2013 FINAL  Final  AFB culture with smear     Status: None (Preliminary result)   Collection Time: 12/09/13  1:28 PM  Result Value Ref Range Status   Specimen Description FLUID RIGHT PLEURAL  Final   Special Requests NONE  Final   Acid Fast Smear   Final    NO ACID FAST BACILLI  SEEN Performed at [facility]    Culture   Final    CULTURE WILL BE EXAMINED FOR 6 WEEKS BEFORE ISSUING A FINAL REPORT Performed at [facility]    Report Status PENDING  Incomplete  Fungus Culture with Smear     Status: None (Preliminary result)   Collection Time: 12/17/13 12:23 PM  Result Value Ref Range Status   Specimen Description TISSUE BONE BIOPSY  Final   Special Requests NONE  Final   Fungal Smear   Final    NO YEAST OR FUNGAL ELEMENTS SEEN Performed at [facility]    Culture   Final    CULTURE IN PROGRESS FOR FOUR WEEKS Performed at [facility]    Report Status PENDING  Incomplete  Tissue culture     Status: None (Preliminary result)   Collection Time: 12/17/13 12:23 PM  Result Value Ref Range Status   Specimen Description TISSUE BONE BIOPSY  Final   Special Requests NONE  Final   Gram Stain   Final    RARE WBC PRESENT,BOTH PMN AND MONONUCLEAR NO ORGANISMS SEEN Performed at [facility]    Culture   Final    NO GROWTH 1 DAY Performed at [facility]    Report Status PENDING  Incomplete     Studies: Ir Fluoro Guide Ndl Plmt / Bx  12/18/2013   CLINICAL DATA:  Severe low back pain, osteomyelitis at T10.  EXAM: IR FLUORO GUIDE NEEDLE PLACEMENT /BIOPSY  ANESTHESIA/SEDATION: Conscious sedation.  MEDICATIONS: Versed 2 mg IV.  Fentanyl 75 mcg IV.  0.25 mg IV Romazicon.  CONTRAST:  None.  PROCEDURE: Following a full explanation of the procedure along with the potential associated complications, an informed witnessed consent was obtained.  The patient was laid prone on the fluoroscopic table.  The skin overlying the thoracic region was then prepped and draped in the usual sterile fashion. The skin entry site at T10 was then infiltrated with 0.25% bupivacaine into the right pedicle.  Using biplane intermittent fluoroscopy, an 11 gauge Jamshidi needle was then advanced into the posterior one-third at T10. This was then  exchanged out for the Kyphon advanced osteo introducer system comprising of a working and a Engineer, manufacturing systems.  The combination was then advanced over a Kyphon osteo bone pin until the Kyphon osteo drill was in  the posterior one-third at T10. At this time the bone pin was removed. In a medial trajectory, the combination was then advanced until the [place] working cannula was in the posterior one-third at T10.  At this time the osteo drill was removed and a core sample sent for microbiologic analysis. Through the working cannula, a Kyphon bone biopsy device was then advanced to within 5 mm of the anterior aspect ofT10. Using a 20 mL syringe, another biopsy sample was then obtained and sent for microbiology analysis. The working cannula was then removed. Hemostasis was achieved at the skin entry site.  The patient tolerated the procedure well.  COMPLICATIONS: None immediate.  FINDINGS: As above.  IMPRESSION: Status post fluoroscopic guided needle placement for deep core bone biopsy and aspiration at T10 as described without event.   Electronically Signed   By: Luanne Bras M.D.   On: 12/17/2013 13:05    Scheduled Meds: . amLODipine  10 mg Oral Daily  . aspirin EC  81 mg Oral Daily  . atorvastatin  40 mg Oral QHS  . collagenase   Topical Daily  . darbepoetin (ARANESP) injection - DIALYSIS  50 mcg Intravenous Q Tue-HD  . [START ON 12/19/2013] dexamethasone  4 mg Oral Daily  . docusate sodium  100 mg Oral BID  . DULoxetine  30 mg Oral Daily  . feeding supplement (NEPRO CARB STEADY)  237 mL Oral Q1500  . feeding supplement (RESOURCE BREEZE)  1 Container Oral BID BM  . fentaNYL  25 mcg Transdermal Q72H  . gabapentin  100 mg Oral Once per day on Mon Wed Fri  . insulin aspart  0-15 Units Subcutaneous TID WC  . insulin glargine  20 Units Subcutaneous QHS  . methimazole  20 mg Oral TID  . metoprolol tartrate  50 mg Oral BID  . multivitamin  1 tablet Oral QHS  . pantoprazole  40 mg Oral Daily  .  polyethylene glycol  17 g Oral Daily  . ranolazine  500 mg Oral BID  . senna-docusate  2 tablet Oral QHS  . sodium chloride  3 mL Intravenous Q12H  . 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   Diabetes mellitus   Hypertension   COPD (chronic obstructive pulmonary disease)   Hyperthyroidism   ESRD on dialysis   Paraparesis   Compression of spinal cord with myelopathy   Pleural effusion   Altered mental state   Pain   Acute delirium   Palliative care encounter   CN (constipation)   Acute respiratory failure with hypoxia   Somnolence   Type 2 diabetes mellitus with diabetic chronic kidney disease   Pressure ulcer   Normocytic anemia   Poor dentition   Unintentional weight loss   Pleural effusion on left   Back pain    Time spent: 25 minWaldron Labs, Jacari Kirsten  [facility] Pager [phone]. If 7PM-7AM, please contact night-coverage at [email or web address], password [facility] 12/18/2013, 12:24 PM  LOS: 12 days

## 2013-12-18 NOTE — Progress Notes (Signed)
Subjective:   cos feeling tired and is more confused in general/ does not remember going for T10 Bx yesterday   Objective Vital signs in last 24 hours: Filed Vitals:   12/17/13 2100 12/17/13 2123 12/17/13 2215 12/18/13 0450  BP: 99/51 136/72 117/49 129/41  Pulse: 50 90 85 85  Temp:  97.5 F (36.4 C) 97.9 F (36.6 C) 98.1 F (36.7 C)  TempSrc:  Oral Oral Oral  Resp:  16 18 17   Height:      Weight:  65.9 kg (145 lb 4.5 oz)    SpO2:  98% 98% 98%   Weight change: 1.3 kg (2 lb 13.9 oz) Physical Exam: General: Alert, NAD/confusedX3 This am, / sitter no longer in room Heart: RRR, no rub, mur or gallop Lungs: cta Abdomen: BS pos, distended but nontender Extremities: L foot in boot lower leg no  edema/ no R pedal edema Dialysis Access: L UArm Avf pos. bruit   HD: [facility] 3h 44min 67.5kg 2/2.25 Bath LUA AVF Heparin 4500 Aranesp 50 ug q thurs, no other meds  Problem/Plan: 1. 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 Blood cultures no growth. Head CT no acute abnormalitity. SP yest T10 bx per recom  ID  Dr. Lucianne Lei dam  /On IV Steroids Dexamethasone 4mg  bid for chord edema/ Was living at [facility] prior to admit for past several months with [facility] lift in OP ASH, HD unit 2. Large Right Pleural Effusion- S/p thoracentesis .cytology had revealed mixed inflammatory cells, reactive appearing mesothelial cells 3 ESRD - [facility] [place], HD  NEEDS To sit in [place] for op HD ,could not yesterday sp T10 bx  Attempt tomor//Had refused HD and now agrees to continue ( "was feeling bad in Lots of pain, not ready to stop HD") 4. Anemia - hgb 10.2 aranesp 50 q Tues HD week. No Fe.  5. Secondary hyperparathyroidism - phos 5.2>2.2 Will dc Fosrenal, poor appetite , watch Phos. Ca+ 10.5 corrected, No vit d / use 2.0 ca bath  hd 7. HTN/volume - Bp 129/41 for hd today and ON amlodipine 10mg  hs and metop.50mg  bid now below  edw / with hd and pl effusion thoracentesis . Decreased  Po/   8 Nutrition - Supplements. Multivit. Renal diet  Ernest Haber, PA-C [facility] Beeper [phone] 12/18/2013,8:53 AM  LOS: 12 days  I have seen and examined this patient and agree with plan per Ernest Haber.  Pt memory continues to be quite poor contributing to her overall confusion. Her QOL appears quite poor.  Plan HD in AM.  Palliative has addressed comfort measures but pt still opting for HD?  Marland Kitchen Rubin Dais T,MD 12/18/2013 9:41 AM Labs: Basic Metabolic Panel:  Recent Labs Lab 12/14/13 1420  12/16/13 0615 12/17/13 2258 12/18/13 0543  NA 127*  < > 132* 136* 134*  K 4.2  < > 4.5 3.7 4.5  CL 85*  < > 90* 92* 91*  CO2 26  < > 26 27 26   GLUCOSE 357*  < > 144* 209* 360*  BUN 33*  < > 37* 24* 34*  CREATININE 3.60*  < > 3.22* 2.23* 2.64*  CALCIUM 9.1  < > 9.1 8.8 8.8  PHOS 2.2*  --   --  2.2*  --   < > = values in this interval not displayed. Liver Function Tests:  Recent Labs Lab 12/12/13 1000 12/14/13 1420 12/17/13 2258 12/18/13 0543  AST 29  --   --  51*  ALT 28  --   --  103*  ALKPHOS 160*  --   --  157*  BILITOT 0.4  --   --  0.3  PROT 7.2  --   --  6.6  ALBUMIN 2.1* 2.2* 2.3* 2.2*   No results for input(s): LIPASE, AMYLASE in the last 168 hours. No results for input(s): AMMONIA in the last 168 hours. CBC:  Recent Labs Lab 12/12/13 1000 12/14/13 1419 12/15/13 0725 12/17/13 0947 12/18/13 0543  WBC 11.5* 13.3* 12.6* 16.6* 18.0*  HGB 9.3* 9.3* 9.7* 10.4* 10.2*  HCT 29.7* 28.4* 31.0* 31.3* 32.2*  MCV 94.9 92.2 93.7 92.3 94.2  PLT 459* 521* 454* 487* 452*   Cardiac Enzymes: No results for input(s): CKTOTAL, CKMB, CKMBINDEX, TROPONINI in the last 168 hours. CBG:  Recent Labs Lab 12/17/13 1639 12/17/13 1936 12/17/13 2118 12/17/13 2155 12/18/13 0751  GLUCAP 282* 206* 155* 158* 375*    Studies/Results: No results found. Medications:   . amLODipine  10 mg Oral  Daily  . aspirin EC  81 mg Oral Daily  . atorvastatin  40 mg Oral QHS  . collagenase   Topical Daily  . darbepoetin (ARANESP) injection - DIALYSIS  50 mcg Intravenous Q Tue-HD  . dexamethasone  4 mg Intravenous Q12H  . docusate sodium  100 mg Oral BID  . DULoxetine  30 mg Oral Daily  . feeding supplement (NEPRO CARB STEADY)  237 mL Oral Q1500  . feeding supplement (RESOURCE BREEZE)  1 Container Oral BID BM  . fentaNYL  25 mcg Transdermal Q72H  . gabapentin  100 mg Oral Once per day on Mon Wed Fri  . insulin aspart  0-15 Units Subcutaneous TID WC  . insulin glargine  20 Units Subcutaneous QHS  . methimazole  20 mg Oral TID  . metoprolol tartrate  50 mg Oral BID  . multivitamin  1 tablet Oral QHS  . pantoprazole  40 mg Oral Daily  . polyethylene glycol  17 g Oral Daily  . ranolazine  500 mg Oral BID  . senna-docusate  2 tablet Oral QHS  . sodium chloride  3 mL Intravenous Q12H  . traZODone  25 mg Oral QHS

## 2013-12-18 NOTE — Progress Notes (Signed)
ANTIBIOTIC CONSULT NOTE - INITIAL  Pharmacy Consult for vancomycin Indication: r.o osteomyelitis  Allergies  Allergen Reactions  . Morphine Sulfate Itching  . Opium Itching  . Ciprofloxacin Hcl Itching  . Lexapro [Escitalopram Oxalate] Other (See Comments)    Mental status changes/lethargy  . Procainamide Hcl Other (See Comments)    Unknown reaction  . Robaxin [Methocarbamol] Other (See Comments)    lethargy    Patient Measurements: Height: 5\' 7"  (170.2 cm) Weight: 145 lb 4.5 oz (65.9 kg) IBW/kg (Calculated) : 61.6 Body Weight: 65.9 kg  Vital Signs: BP: 132/53 mmHg (11/11 1039) Pulse Rate: 88 (11/11 1041) Intake/Output from previous day: 11/10 0701 - 11/11 0700 In: 600 [P.O.:600] Out: 2341 [Urine:160] Intake/Output from this shift:    Labs:  Recent Labs  12/16/13 0615 12/17/13 0947 12/17/13 2258 12/18/13 0543  WBC  --  16.6*  --  18.0*  HGB  --  10.4*  --  10.2*  PLT  --  487*  --  452*  CREATININE 3.22*  --  2.23* 2.64*   Estimated Creatinine Clearance: 19 mL/min (by C-G formula based on Cr of 2.64). No results for input(s): VANCOTROUGH, VANCOPEAK, VANCORANDOM, GENTTROUGH, GENTPEAK, GENTRANDOM, TOBRATROUGH, TOBRAPEAK, TOBRARND, AMIKACINPEAK, AMIKACINTROU, AMIKACIN in the last 72 hours.   Microbiology: Recent Results (from the past 720 hour(s))  MRSA PCR Screening     Status: None   Collection Time: 12/07/13  1:03 AM  Result Value Ref Range Status   MRSA by PCR NEGATIVE NEGATIVE Final    Comment:        The GeneXpert MRSA Assay (FDA approved for NASAL specimens only), is one component of a comprehensive MRSA colonization surveillance program. It is not intended to diagnose MRSA infection nor to guide or monitor treatment for MRSA infections.  Culture, blood (routine x 2)     Status: None   Collection Time: 12/07/13 10:52 AM  Result Value Ref Range Status   Specimen Description BLOOD RIGHT HAND  Final   Special Requests BOTTLES DRAWN AEROBIC ONLY  1CC  Final   Culture  Setup Time   Final    12/07/2013 20:10 Performed at [facility]    Culture   Final    NO GROWTH 5 DAYS Performed at [facility]    Report Status 12/13/2013 FINAL  Final  Culture, blood (routine x 2)     Status: None   Collection Time: 12/07/13 11:00 AM  Result Value Ref Range Status   Specimen Description BLOOD RIGHT HAND  Final   Special Requests BOTTLES DRAWN AEROBIC ONLY 5CC  Final   Culture  Setup Time   Final    12/07/2013 20:10 Performed at [facility]    Culture   Final    NO GROWTH 5 DAYS Performed at [facility]    Report Status 12/13/2013 FINAL  Final  Fungus Culture with Smear     Status: None (Preliminary result)   Collection Time: 12/09/13  1:28 PM  Result Value Ref Range Status   Specimen Description FLUID RIGHT PLEURAL  Final   Special Requests NONE  Final   Fungal Smear   Final    NO YEAST OR FUNGAL ELEMENTS SEEN Performed at [facility]    Culture   Final    CULTURE IN PROGRESS FOR FOUR WEEKS Performed at [facility]    Report Status PENDING  Incomplete  Body fluid culture     Status: None   Collection Time: 12/09/13  1:28 PM  Result Value Ref Range Status   Specimen Description FLUID RIGHT PLEURAL  Final   Special Requests NONE  Final   Gram Stain   Final    RARE WBC PRESENT, PREDOMINANTLY MONONUCLEAR NO ORGANISMS SEEN Performed at [facility]    Culture   Final    NO GROWTH 3 DAYS Performed at [facility]    Report Status 12/13/2013 FINAL  Final  AFB culture with smear     Status: None (Preliminary result)   Collection Time: 12/09/13  1:28 PM  Result Value Ref Range Status   Specimen Description FLUID RIGHT PLEURAL  Final   Special Requests NONE  Final   Acid Fast Smear   Final    NO ACID FAST BACILLI SEEN Performed at [facility]    Culture   Final    CULTURE WILL BE EXAMINED FOR 6 WEEKS BEFORE ISSUING A FINAL REPORT Performed at  [facility]    Report Status PENDING  Incomplete  Fungus Culture with Smear     Status: None (Preliminary result)   Collection Time: 12/17/13 12:23 PM  Result Value Ref Range Status   Specimen Description TISSUE BONE BIOPSY  Final   Special Requests NONE  Final   Fungal Smear   Final    NO YEAST OR FUNGAL ELEMENTS SEEN Performed at [facility]    Culture   Final    CULTURE IN PROGRESS FOR FOUR WEEKS Performed at [facility]    Report Status PENDING  Incomplete  Tissue culture     Status: None (Preliminary result)   Collection Time: 12/17/13 12:23 PM  Result Value Ref Range Status   Specimen Description TISSUE BONE BIOPSY  Final   Special Requests NONE  Final   Gram Stain   Final    RARE WBC PRESENT,BOTH PMN AND MONONUCLEAR NO ORGANISMS SEEN Performed at [facility]    Culture   Final    NO GROWTH 1 DAY Performed at [facility]    Report Status PENDING  Incomplete  AFB culture with smear     Status: None (Preliminary result)   Collection Time: 12/17/13 12:23 PM  Result Value Ref Range Status   Specimen Description TISSUE BONE BIOPSY  Final   Special Requests NONE  Final   Acid Fast Smear   Final    NO ACID FAST BACILLI SEEN Performed at [facility]    Culture   Final    CULTURE WILL BE EXAMINED FOR 6 WEEKS BEFORE ISSUING A FINAL REPORT Performed at [facility]    Report Status PENDING  Incomplete    Medical History: Past Medical History  Diagnosis Date  . Chronic kidney disease     just found out? Placed graft in [facility] Tarri Glenn in [facility]  started dialysis in 06/2011  . Arthritis     Osteoarthritis  . Neuropathy in diabetes   . Insomnia   . Asthma   . CAD (coronary artery disease) 2012    7 stents-First PCI was in [place].  The last time she had a procedure was  a stent last year, thena  CABG was undertaken in [facility])  . GERD (gastroesophageal reflux disease)   .  Diabetes mellitus     Type II  . COPD (chronic obstructive pulmonary disease)   . Hypertension   . Peripheral vascular disease     endarterectomy  . Anemia   . CHF (  congestive heart failure)   . Coronary artery dilation   . Myocardial infarction 2013    x 3  . 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 H/O Bell's palsy   . Frequent headaches   . Orthostasis     Medications:  Prescriptions prior to admission  Medication Sig Dispense Refill Last Dose  . acetaminophen (TYLENOL) 325 MG tablet Take 325-650 mg by mouth See admin instructions. Take 1 tablet (325 mg) every 4 hours as needed for mild pain, take 2 tablets (650 mg) every 4 hours as needed for severe pain   12/05/2013 at 1030  . aspirin EC 81 MG EC tablet Take 1 tablet (81 mg total) by mouth daily.   12/06/2013 at Unknown time  . atorvastatin (LIPITOR) 40 MG tablet Take 40 mg by mouth at bedtime.    12/05/2013  . b complex-vitamin c-folic acid (NEPHRO-VITE) 0.8 MG TABS tablet Take 1 tablet by mouth daily.   12/05/2013  . baclofen (LIORESAL) 10 MG tablet Take 10 mg by mouth every 12 (twelve) hours as needed for muscle spasms.   12/06/2013 at Unknown time  . bisacodyl (DULCOLAX) 10 MG suppository Place 1 suppository (10 mg total) rectally daily at 6 (six) AM. 12 suppository 0 12/06/2013 at Unknown time  . calcium carbonate (TUMS EX) 750 MG chewable tablet Chew 1 tablet (750 mg total) by mouth 3 (three) times daily as needed for heartburn.   unknown  . cholecalciferol (VITAMIN D) 1000 UNITS tablet Take 1,000 Units by mouth daily.   12/06/2013 at Unknown time  . clopidogrel (PLAVIX) 75 MG tablet Take 1 tablet (75 mg total) by mouth daily. 30 tablet 0 12/06/2013 at Unknown time  . diazepam (VALIUM) 10 MG tablet Take 10 mg by mouth once. Given prior to MRI   12/06/2013 at 1145  . docusate sodium (COLACE) 100 MG capsule Take 100 mg by mouth 2 (two) times daily. 8am and 4pm   12/06/2013 at Unknown time  . DULoxetine (CYMBALTA) 30  MG capsule Take 30 mg by mouth 2 (two) times daily. 8am and 4pm   12/06/2013 at Unknown time  . HYDROcodone-acetaminophen (NORCO) 10-325 MG per tablet Take 1 tablet by mouth every 6 (six) hours as needed (pain).   unknown  . insulin aspart (NOVOLOG) 100 unit/mL injection Inject 0-10 Units into the skin 3 (three) times daily before meals. Sliding scale CBG <120 0 units, 121-150 3 units, 151-180 4 units, 181-250 6 units, 251-350 8 units, 351-400 10 units   12/06/2013 at Unknown time  . insulin glargine (LANTUS) 100 UNIT/ML injection Inject 0.1 mLs (10 Units total) into the skin daily. 10 mL 11 12/06/2013 at Unknown time  . lidocaine (XYLOCAINE) 0.5 % injection Apply topically daily as needed (pain related to dialysis).   unknown  . LORazepam (ATIVAN) 0.5 MG tablet Take 0.5 mg by mouth See admin instructions. Take 1 tablet (0.5 mg) prior to dialysis on Tuesday, Thursday and Saturday for anxiety; and 1 tablet (0.5 mg) daily at bedtime, may also give 1 tablet (0.5 mg) every 4 hours as needed for anxiety.   12/05/2013 at 400  . methimazole (TAPAZOLE) 10 MG tablet Take 10 mg by mouth 3 (three) times daily. 6am, 4pm, 10pm   12/06/2013 at Unknown time  . metoprolol tartrate (LOPRESSOR) 25 MG tablet Take 25 mg by mouth 2 (two) times daily. 8am, 4pm   12/06/2013 at 800  . oxyCODONE (OXY IR/ROXICODONE) 5 MG immediate release  tablet Take 5 mg by mouth every 6 (six) hours as needed (mild pain).   12/05/2013  . pantoprazole (PROTONIX) 40 MG tablet Take 40 mg by mouth daily before breakfast.    12/06/2013 at Unknown time  . polyethylene glycol (MIRALAX / GLYCOLAX) packet Take 17 g by mouth daily.   12/05/2013  . PRESCRIPTION MEDICATION Take 120 mLs by mouth 3 (three) times daily with meals. NSA Medpass   12/06/2013 at Unknown time  . ranolazine (RANEXA) 500 MG 12 hr tablet Take 500 mg by mouth 2 (two) times daily. 8am and 4pm   12/06/2013 at Unknown time  . sorbitol 70 % solution Take 30 mLs by mouth 2 (two) times  daily as needed (constipation).   unknown  . traZODone (DESYREL) 50 MG tablet Take 1 tablet (50 mg total) by mouth at bedtime. 30 tablet 0 12/05/2013  . lidocaine (LIDODERM) 5 % Place 2 patches onto the skin daily. Remove & Discard patch within 12 hours or as directed by MD 23 patch 0    Assessment: 73 yo lady with ESRD on HD usually TThS to start vancomycin for r/o osteomyelitis.    Goal of Therapy:  preHD vanc level 20-25 mg/L  Plan:  Vancomycin 1500 mg IV X 1 tonight. F/u future doses based on HD schedule  Thanks for allowing pharmacy to be a part of this patient's care.  Excell Seltzer, PharmD Clinical Pharmacist, [phone] 12/18/2013,7:12 PM

## 2013-12-18 NOTE — Progress Notes (Signed)
Patient HR:CBULAG Holly Ellison      DOB: 1940-09-04      TXM:[id number]   Palliative Medicine Team at [facility] Progress Note    Subjective: [name] continues to have pain in her back>  When I try to examine her back she grimaces and is fearful.  She wonders why we are doing all these tests.  Dialysis continues to make her pain worse.  She wishes people would let her sleep. No appetite today.      Filed Vitals:   12/18/13 1041  BP:   Pulse: 88  Temp:   Resp:    Physical exam: GEN: alert, NAD CV: RRR LUNGS: CTAB ABD: soft, ND   CBC    Component Value Date/Time   WBC 18.0* 12/18/2013 0543   RBC 3.42* 12/18/2013 0543   RBC 2.66* 06/05/2011 0156   HGB 10.2* 12/18/2013 0543   HCT 32.2* 12/18/2013 0543   PLT 452* 12/18/2013 0543   MCV 94.2 12/18/2013 0543   MCH 29.8 12/18/2013 0543   MCHC 31.7 12/18/2013 0543   RDW 16.9* 12/18/2013 0543   LYMPHSABS 1.5 07/17/2013 1110   MONOABS 0.3 07/17/2013 1110   EOSABS 0.0 07/17/2013 1110   BASOSABS 0.0 07/17/2013 1110    CMP     Component Value Date/Time   NA 134* 12/18/2013 0543   K 4.5 12/18/2013 0543   CL 91* 12/18/2013 0543   CO2 26 12/18/2013 0543   GLUCOSE 360* 12/18/2013 0543   BUN 34* 12/18/2013 0543   CREATININE 2.64* 12/18/2013 0543   CALCIUM 8.8 12/18/2013 0543   PROT 6.6 12/18/2013 0543   ALBUMIN 2.2* 12/18/2013 0543   AST 51* 12/18/2013 0543   ALT 103* 12/18/2013 0543   ALKPHOS 157* 12/18/2013 0543   BILITOT 0.3 12/18/2013 0543   GFRNONAA 17* 12/18/2013 0543   GFRAA 20* 12/18/2013 0543       Assessment and plan: 73 yo female with multiple medical problems as below who presented with SOB and weakness in setting of compression fractures of unknown etiology and new Right Pleural effusion.   1. Code Status: Full  2. Goals of Care: Continues to be challenging case diagnostically. Awaiting bx results to r/o infectious cause as well.  Started on steroids and pain medicines transitioned some.  Son [name]  feels like things are a little better. [name] still continues to have the sam complaints. [name] also feels like she is worse today and was "fine" 2 days ago. When I asked if he meant back to where she was before coming into the hospital, he responded with "no I don't think she will ever get back there".  [name] is still frustrated some by the medicines she is on (including for pain) but okay with current regimen. While she has been tolerated dialysis this past week, she still has a lot of the same complaints as when I saw her last week.    3. Symptom Management:  1. Back/Abdominal Pain- On decadron for potential cord compression causing pain, cymbalta low dose, fentanyl 37mcg patch, renally dosed gabapentin, PRN dilaudid. Things may be a little better since i last saw her but still a lot of the same complaints.  2. Encephalopathy- benzo's weaned off. Still some fluctuations in mentation.  3. Chronic Constipation- Maintain current bowel regimen.  4. Psychosocial/Spiritual: Originally she is from [place]. Has 5 children but only her son [name] lives locally.   Total time 25 minutes  >50% of time spent in counseling and coordination of care.  [name] D.O. Palliative Medicine Team at [facility]  Pager: [phone] Team Phone: [phone]

## 2013-12-18 NOTE — Progress Notes (Signed)
[facility]ious Disease    Subjective: Very unhappy with being in hospital still and not getting any better   Antibiotics:  Anti-infectives    None      Medications: Scheduled Meds: . amLODipine  10 mg Oral Daily  . aspirin EC  81 mg Oral Daily  . atorvastatin  40 mg Oral QHS  . collagenase   Topical Daily  . darbepoetin (ARANESP) injection - DIALYSIS  50 mcg Intravenous Q Tue-HD  . [START ON 12/19/2013] dexamethasone  4 mg Oral Daily  . docusate sodium  100 mg Oral BID  . DULoxetine  30 mg Oral Daily  . feeding supplement (NEPRO CARB STEADY)  237 mL Oral Q1500  . feeding supplement (RESOURCE BREEZE)  1 Container Oral BID BM  . fentaNYL  25 mcg Transdermal Q72H  . gabapentin  100 mg Oral Once per day on Mon Wed Fri  . insulin aspart  0-15 Units Subcutaneous TID WC  . insulin glargine  20 Units Subcutaneous QHS  . methimazole  20 mg Oral TID  . metoprolol tartrate  50 mg Oral BID  . multivitamin  1 tablet Oral QHS  . pantoprazole  40 mg Oral Daily  . polyethylene glycol  17 g Oral Daily  . ranolazine  500 mg Oral BID  . senna-docusate  2 tablet Oral QHS  . sodium chloride  3 mL Intravenous Q12H  . traZODone  25 mg Oral QHS   Continuous Infusions:  PRN Meds:.acetaminophen **OR** acetaminophen, albuterol, ALPRAZolam, baclofen, dextrose, fentaNYL, flumazenil, hydrALAZINE, HYDROmorphone, midazolam, [DISCONTINUED] ondansetron **OR** ondansetron (ZOFRAN) IV, ondansetron    Objective: Weight change: 2 lb 13.9 oz (1.3 kg)  Intake/Output Summary (Last 24 hours) at 12/18/13 1904 Last data filed at 12/18/13 1510  Gross per 24 hour  Intake    280 ml  Output   2181 ml  Net  -1901 ml   Blood pressure 132/53, pulse 88, temperature 98.1 F (36.7 C), temperature source Oral, resp. rate 17, height 5' 7"  (1.702 m), weight 145 lb 4.5 oz (65.9 kg), SpO2 98 %. Temp:  [97.5 F (36.4 C)-98.1 F (36.7 C)] 98.1 F (36.7 C) (11/11 0450) Pulse Rate:  [50-92] 88  (11/11 1041) Resp:  [16-18] 17 (11/11 0450) BP: (99-136)/(41-72) 132/53 mmHg (11/11 1039) SpO2:  [98 %] 98 % (11/11 0450) Weight:  [145 lb 4.5 oz (65.9 kg)] 145 lb 4.5 oz (65.9 kg) (11/10 2123)  Physical Exam: General: Alert and awake, oriented x3, not in any acute distress. HEENT:EOMI CVS regular rate, normal r,  no murmur rubs or gallops Chest: clear to auscultation bilaterally, no wheezing, rales or rhonchi Abdomen: soft nontender, nondistended, normal bowel sounds, Extremities: no  clubbing or edema noted bilaterally Neuro: nonfocal  CBC:  Recent Labs Lab 12/12/13 1000 12/14/13 1419 12/15/13 0725 12/17/13 0947 12/18/13 0543  HGB 9.3* 9.3* 9.7* 10.4* 10.2*  HCT 29.7* 28.4* 31.0* 31.3* 32.2*  PLT 459* 521* 454* 487* 452*  INR  --   --   --  1.09  --   APTT  --   --   --  28  --      BMET  Recent Labs  12/17/13 2258 12/18/13 0543  NA 136* 134*  K 3.7 4.5  CL 92* 91*  CO2 27 26  GLUCOSE 209* 360*  BUN 24* 34*  CREATININE 2.23* 2.64*  CALCIUM 8.8 8.8     Liver Panel   Recent Labs  12/17/13 2258 12/18/13 0543  PROT  --  6.6  ALBUMIN 2.3* 2.2*  AST  --  51*  ALT  --  103*  ALKPHOS  --  157*  BILITOT  --  0.3       Sedimentation Rate  Recent Labs  12/17/13 0947  ESRSEDRATE 101*   C-Reactive Protein  Recent Labs  12/17/13 0947  CRP 0.9*    Micro Results: Recent Results (from the past 720 hour(s))  MRSA PCR Screening     Status: None   Collection Time: 12/07/13  1:03 AM  Result Value Ref Range Status   MRSA by PCR NEGATIVE NEGATIVE Final    Comment:        The GeneXpert MRSA Assay (FDA approved for NASAL specimens only), is one component of a comprehensive MRSA colonization surveillance program. It is not intended to diagnose MRSA infection nor to guide or monitor treatment for MRSA infections.  Culture, blood (routine x 2)     Status: None   Collection Time: 12/07/13 10:52 AM  Result Value Ref Range Status   Specimen  Description BLOOD RIGHT HAND  Final   Special Requests BOTTLES DRAWN AEROBIC ONLY 1CC  Final   Culture  Setup Time   Final    12/07/2013 20:10 Performed at [facility]    Culture   Final    NO GROWTH 5 DAYS Performed at [facility]    Report Status 12/13/2013 FINAL  Final  Culture, blood (routine x 2)     Status: None   Collection Time: 12/07/13 11:00 AM  Result Value Ref Range Status   Specimen Description BLOOD RIGHT HAND  Final   Special Requests BOTTLES DRAWN AEROBIC ONLY 5CC  Final   Culture  Setup Time   Final    12/07/2013 20:10 Performed at [facility]    Culture   Final    NO GROWTH 5 DAYS Performed at [facility]    Report Status 12/13/2013 FINAL  Final  Fungus Culture with Smear     Status: None (Preliminary result)   Collection Time: 12/09/13  1:28 PM  Result Value Ref Range Status   Specimen Description FLUID RIGHT PLEURAL  Final   Special Requests NONE  Final   Fungal Smear   Final    NO YEAST OR FUNGAL ELEMENTS SEEN Performed at [facility]    Culture   Final    CULTURE IN PROGRESS FOR FOUR WEEKS Performed at [facility]    Report Status PENDING  Incomplete  Body fluid culture     Status: None   Collection Time: 12/09/13  1:28 PM  Result Value Ref Range Status   Specimen Description FLUID RIGHT PLEURAL  Final   Special Requests NONE  Final   Gram Stain   Final    RARE WBC PRESENT, PREDOMINANTLY MONONUCLEAR NO ORGANISMS SEEN Performed at [facility]    Culture   Final    NO GROWTH 3 DAYS Performed at [facility]    Report Status 12/13/2013 FINAL  Final  AFB culture with smear     Status: None (Preliminary result)   Collection Time: 12/09/13  1:28 PM  Result Value Ref Range Status   Specimen Description FLUID RIGHT PLEURAL  Final   Special Requests NONE  Final   Acid Fast Smear   Final    NO ACID FAST BACILLI SEEN Performed at [facility]    Culture   Final     CULTURE  WILL BE EXAMINED FOR 6 WEEKS BEFORE ISSUING A FINAL REPORT Performed at [facility]    Report Status PENDING  Incomplete  Fungus Culture with Smear     Status: None (Preliminary result)   Collection Time: 12/17/13 12:23 PM  Result Value Ref Range Status   Specimen Description TISSUE BONE BIOPSY  Final   Special Requests NONE  Final   Fungal Smear   Final    NO YEAST OR FUNGAL ELEMENTS SEEN Performed at [facility]    Culture   Final    CULTURE IN PROGRESS FOR FOUR WEEKS Performed at [facility]    Report Status PENDING  Incomplete  Tissue culture     Status: None (Preliminary result)   Collection Time: 12/17/13 12:23 PM  Result Value Ref Range Status   Specimen Description TISSUE BONE BIOPSY  Final   Special Requests NONE  Final   Gram Stain   Final    RARE WBC PRESENT,BOTH PMN AND MONONUCLEAR NO ORGANISMS SEEN Performed at [facility]    Culture   Final    NO GROWTH 1 DAY Performed at [facility]    Report Status PENDING  Incomplete  AFB culture with smear     Status: None (Preliminary result)   Collection Time: 12/17/13 12:23 PM  Result Value Ref Range Status   Specimen Description TISSUE BONE BIOPSY  Final   Special Requests NONE  Final   Acid Fast Smear   Final    NO ACID FAST BACILLI SEEN Performed at [facility]    Culture   Final    CULTURE WILL BE EXAMINED FOR 6 WEEKS BEFORE ISSUING A FINAL REPORT Performed at [facility]    Report Status PENDING  Incomplete    Studies/Results: Ir Fluoro Guide Ndl Plmt / Bx  12/18/2013   CLINICAL DATA:  Severe low back pain, osteomyelitis at T10.  EXAM: IR FLUORO GUIDE NEEDLE PLACEMENT /BIOPSY  ANESTHESIA/SEDATION: Conscious sedation.  MEDICATIONS: Versed 2 mg IV.  Fentanyl 75 mcg IV.  0.25 mg IV Romazicon.  CONTRAST:  None.  PROCEDURE: Following a full explanation of the procedure along with the potential associated complications, an informed witnessed  consent was obtained.  The patient was laid prone on the fluoroscopic table.  The skin overlying the thoracic region was then prepped and draped in the usual sterile fashion. The skin entry site at T10 was then infiltrated with 0.25% bupivacaine into the right pedicle.  Using biplane intermittent fluoroscopy, an 11 gauge Jamshidi needle was then advanced into the posterior one-third at T10. This was then exchanged out for the Kyphon advanced osteo introducer system comprising of a working and a Engineer, manufacturing systems.  The combination was then advanced over a Kyphon osteo bone pin until the Kyphon osteo drill was in the posterior one-third at T10. At this time the bone pin was removed. In a medial trajectory, the combination was then advanced until the [place] working cannula was in the posterior one-third at T10.  At this time the osteo drill was removed and a core sample sent for microbiologic analysis. Through the working cannula, a Kyphon bone biopsy device was then advanced to within 5 mm of the anterior aspect ofT10. Using a 20 mL syringe, another biopsy sample was then obtained and sent for microbiology analysis. The working cannula was then removed. Hemostasis was achieved at the skin entry site.  The patient tolerated the procedure well.  COMPLICATIONS: None immediate.  FINDINGS: As above.  IMPRESSION: Status post fluoroscopic guided needle placement for deep core bone biopsy and aspiration at T10 as described without event.   Electronically Signed   By: Luanne Bras M.D.   On: 12/17/2013 13:05      Assessment/Plan:  Principal Problem:   Fracture of thoracic spine with cord lesion Active Problems:   Atherosclerosis of native arteries of the extremities with ulceration   CAD (coronary artery disease) of bypass graft   Diabetes mellitus   Hypertension   COPD (chronic obstructive pulmonary disease)   Hyperthyroidism   ESRD on dialysis   Paraparesis   Compression of spinal cord with  myelopathy   Pleural effusion   Altered mental state   Pain   Acute delirium   Palliative care encounter   CN (constipation)   Acute respiratory failure with hypoxia   Somnolence   Type 2 diabetes mellitus with diabetic chronic kidney disease   Pressure ulcer   Normocytic anemia   Poor dentition   Unintentional weight loss   Pleural effusion on left   Back pain    Holly Ellison is a 73 y.o. female with ESRD on HD with prior MSSA bacteremia found toa vhe lytic t11 vertebral lesion sp operative decompression and biopsy with The biopsy revealed only dense fibrocartilaginous tissue without evidence for malignancy or infection. The biopsy was not sent for culture. She was afebrile and blood cultures were negative at that time. Progressive destructive process involving the T9, T10, and T11vertebra. Extension of abnormal soft tissue into the spinal canal at that level which probably and compresses the spinal cord. Secondary extensive soft tissue reaction in the adjacent soft tissues.. Large right pleural effusion that is exudative  #1 T spine disease: After reviewing her case I think this is [facility] MORE likely to represent bacterial infection of the vertebral spine likely from prior MSSA bacteremia. MTB PCR from pleural effusion and ADA pending. Esr 101.   I DONT Think this is likely TB  --GREATLY APPRECIATE  GETTING IR GUIDED ASPIRATE OF HER THORACIC VERTEBRA WITH SPECIMEN SENT FOR BACTERIAL CULTURES, AFB STAIN AND CULTURE AND FUNGAL STAIN AND CULTURE\  --we have no growth to date so far but I will start IV vancomycin to cover for her known MSSA and for possibility of COag neg staph or other difficult to isolate organism  --followup cultures  #2 PLEURAL EFFUSION  NOT likely to be TB MTB PCR and adenosine deaminase are pending (TB PCR should be back in next 3-5 business days per lab should have been run at reference lab today  #3 Screening:  HIV and Hep C  negative      LOS: 12 days    Alcide Evener 12/18/2013, 7:04 PM

## 2013-12-18 NOTE — Progress Notes (Signed)
Dressing changed on unstageable sacral wound (14.0x4.5).

## 2013-12-18 NOTE — Clinical Social Work Psychosocial (Signed)
Clinical Social Work Department BRIEF PSYCHOSOCIAL ASSESSMENT 12/18/2013  Patient:  Holly Ellison, Holly Ellison     Account Number:  [id number]     Admit date:  12/06/2013  Clinical Social Worker:  Frederico Hamman  Date/Time:  12/18/2013 03:42 AM  Referred by:  Physician  Date Referred:  12/16/2013 Referred for  SNF Placement   Other Referral:   Interview type:  Family Other interview type:    PSYCHOSOCIAL DATA Living Status:  FACILITY Admitted from facility:  Other Level of care:  [place] Primary support name:  Lilyanna Lunt Primary support relationship to patient:  CHILD, ADULT Degree of support available:   Good support, son at the bedside. Patient is from [facility] skilled nursing facility.    CURRENT CONCERNS Current Concerns  Post-Acute Placement   Other Concerns:    SOCIAL WORK ASSESSMENT / PLAN CSW talked by phone and in person with son (who was at the bedside) regarding discharge plans. Patient is from [facility] and the plan is for her to return there at discharge per son.   Assessment/plan status:  Psychosocial Support/Ongoing Assessment of Needs Other assessment/ plan:   Information/referral to community resources:   None needed or requested at this time.    PATIENT'S/FAMILY'S RESPONSE TO PLAN OF CARE: Son at the bedside and receptive to talking with CSW by phone and in person. D/C plan is back to facility per son.

## 2013-12-18 NOTE — Plan of Care (Signed)
Problem: Phase II Progression Outcomes Goal: Vital signs remain stable Outcome: Completed/Met Date Met:  12/18/13  Problem: Phase III Progression Outcomes Goal: Pain controlled on oral analgesia Outcome: Completed/Met Date Met:  12/18/13 Goal: Activity at appropriate level-compared to baseline (UP IN CHAIR FOR HEMODIALYSIS)  Outcome: Completed/Met Date Met:  12/18/13

## 2013-12-19 DIAGNOSIS — R7881 Bacteremia: Secondary | ICD-10-CM | POA: Insufficient documentation

## 2013-12-19 LAB — BASIC METABOLIC PANEL
Anion gap: 15 (ref 5–15)
BUN: 29 mg/dL — AB (ref 6–23)
CHLORIDE: 98 meq/L (ref 96–112)
CO2: 26 mEq/L (ref 19–32)
Calcium: 9.2 mg/dL (ref 8.4–10.5)
Creatinine, Ser: 2.18 mg/dL — ABNORMAL HIGH (ref 0.50–1.10)
GFR calc Af Amer: 25 mL/min — ABNORMAL LOW (ref >90)
GFR calc non Af Amer: 22 mL/min — ABNORMAL LOW (ref >90)
Glucose, Bld: 151 mg/dL — ABNORMAL HIGH (ref 70–99)
POTASSIUM: 3.5 meq/L — AB (ref 3.7–5.3)
Sodium: 139 mEq/L (ref 137–147)

## 2013-12-19 LAB — GLUCOSE, CAPILLARY
GLUCOSE-CAPILLARY: 244 mg/dL — AB (ref 70–99)
Glucose-Capillary: 155 mg/dL — ABNORMAL HIGH (ref 70–99)
Glucose-Capillary: 145 mg/dL — ABNORMAL HIGH (ref 70–99)
Glucose-Capillary: 157 mg/dL — ABNORMAL HIGH (ref 70–99)

## 2013-12-19 MED ORDER — HEPARIN SODIUM (PORCINE) 1000 UNIT/ML IJ SOLN
1400.0000 [IU] | Freq: Once | INTRAMUSCULAR | Status: AC
  Administered 2013-12-19: 1400 [IU] via INTRAVENOUS

## 2013-12-19 MED ORDER — INSULIN GLARGINE 100 UNIT/ML ~~LOC~~ SOLN: 20.0000 [IU] | Freq: Every day | SUBCUTANEOUS | Status: AC

## 2013-12-19 MED ORDER — FENTANYL 25 MCG/HR TD PT72: 25.0000 ug | MEDICATED_PATCH | TRANSDERMAL | Status: AC

## 2013-12-19 MED ORDER — NEPRO/CARBSTEADY PO LIQD: 237.0000 mL | Freq: Every day | ORAL | Status: AC

## 2013-12-19 MED ORDER — METOPROLOL TARTRATE 50 MG PO TABS: 50.0000 mg | ORAL_TABLET | Freq: Two times a day (BID) | ORAL | Status: AC

## 2013-12-19 MED ORDER — INSULIN GLARGINE 100 UNIT/ML ~~LOC~~ SOLN: 5.0000 [IU] | Freq: Once | SUBCUTANEOUS | Status: DC

## 2013-12-19 MED ORDER — INSULIN GLARGINE 100 UNIT/ML ~~LOC~~ SOLN
5.0000 [IU] | Freq: Once | SUBCUTANEOUS | Status: AC
  Administered 2013-12-19: 5 [IU] via SUBCUTANEOUS
  Filled 2013-12-19 (×2): qty 0.05

## 2013-12-19 MED ORDER — GABAPENTIN 100 MG PO CAPS: 100.0000 mg | ORAL_CAPSULE | ORAL | Status: AC

## 2013-12-19 MED ORDER — UNABLE TO FIND: Status: AC

## 2013-12-19 MED ORDER — HYDROMORPHONE HCL 2 MG PO TABS: 2.0000 mg | ORAL_TABLET | ORAL | Status: AC | PRN

## 2013-12-19 MED ORDER — ALBUTEROL SULFATE (2.5 MG/3ML) 0.083% IN NEBU: 3.0000 mL | INHALATION_SOLUTION | Freq: Four times a day (QID) | RESPIRATORY_TRACT | Status: AC | PRN

## 2013-12-19 MED ORDER — INSULIN ASPART 100 UNIT/ML ~~LOC~~ SOLN: 0.0000 [IU] | Freq: Three times a day (TID) | SUBCUTANEOUS | Status: AC

## 2013-12-19 NOTE — Plan of Care (Signed)
Problem: Phase III Progression Outcomes Goal: Tolerating diet Outcome: Completed/Met Date Met:  12/19/13     

## 2013-12-19 NOTE — Progress Notes (Signed)
Patient continuously repeating "leave me alone," will not engage in conversation.  Patient refusing 5 units Lantus at this time. Patient also refusing medications. Will continue to monitor and will try to give medications at later time. Dr. Waldron Labs in room and aware.

## 2013-12-19 NOTE — Progress Notes (Signed)
12/19/13 19:51 Patient discharged to [facility] via [place] transport. Ondria Oswald, Wonda Cheng, Therapist, sports

## 2013-12-19 NOTE — Discharge Instructions (Signed)
Follow with accepting facility physician on admission.    Activity: As tolerated with Full fall precautions use walker/cane & assistance as needed   Disposition LTAC   Diet: RENAL DIALYSIS WITH 1200 Ml FLUID RESTRICTION , with feeding assistance and aspiration precautions as needed.  For Heart failure patients - Check your Weight same time everyday, if you gain over 2 pounds, or you develop in leg swelling, experience more shortness of breath or chest pain, call your Primary MD immediately.    On your next visit with your primary care physician please Get Medicines reviewed and adjusted.   Please request your Prim.MD to go over all Hospital Tests and Procedure/Radiological results at the follow up, please get all Hospital records sent to your Prim MD by signing hospital release before you go home.   If you experience worsening of your admission symptoms, develop shortness of breath, life threatening emergency, suicidal or homicidal thoughts you must seek medical attention immediately by calling 911 or calling your MD immediately  if symptoms less severe.  You Must read complete instructions/literature along with all the possible adverse reactions/side effects for all the Medicines you take and that have been prescribed to you. Take any new Medicines after you have completely understood and accpet all the possible adverse reactions/side effects.   Do not drive, operating heavy machinery, perform activities at heights, swimming or participation in water activities or provide baby sitting services if your were admitted for syncope or siezures until you have seen by Primary MD or a Neurologist and advised to do so again.  Do not drive when taking Pain medications.    Do not take more than prescribed Pain, Sleep and Anxiety Medications  Special Instructions: If you have smoked or chewed Tobacco  in the last 2 yrs please stop smoking, stop any regular Alcohol  and or any Recreational drug  use.  Wear Seat belts while driving.   Please note  You were cared for by a hospitalist during your hospital stay. If you have any questions about your discharge medications or the care you received while you were in the hospital after you are discharged, you can call the unit and asked to speak with the hospitalist on call if the hospitalist that took care of you is not available. Once you are discharged, your primary care physician will handle any further medical issues. Please note that NO REFILLS for any discharge medications will be authorized once you are discharged, as it is imperative that you return to your primary care physician (or establish a relationship with a primary care physician if you do not have one) for your aftercare needs so that they can reassess your need for medications and monitor your lab values.

## 2013-12-19 NOTE — Plan of Care (Signed)
Problem: Phase II Progression Outcomes Goal: Discharge plan established Outcome: Completed/Met Date Met:  12/19/13

## 2013-12-19 NOTE — Progress Notes (Signed)
Subjective: " Leave me alone"  Refused Hemodialysis today  Objective Vital signs in last 24 hours: Filed Vitals:   12/18/13 1039 12/18/13 1041 12/18/13 2100 12/19/13 0700  BP: 132/53  141/68 167/66  Pulse:  88 93 80  Temp:   97.6 F (36.4 C) 98.7 F (37.1 C)  TempSrc:   Oral Oral  Resp:   18 18  Height:      Weight:   64.7 kg (142 lb 10.2 oz)   SpO2:   99% 100%   Weight change: -5.4 kg (-11 lb 14.5 oz)  Physical Exam: General: alert and no distress but tells me to leave the room will not let me exam her   HD: T[facility] [place] 3h 37min 67.5kg 2/2.25 Bath LUA AVF Heparin 4500 Aranesp 50 ug q thurs, no other meds  Problem/Plan: 1. 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 Blood cultures no growth. Head CT no acute abnormalitity. SP 12/17/13 T10 bx per recom ID Dr. Lucianne Lei dam /On IV Steroids Dexamethasone 4mg  bid for chord edema/ Was living at [facility] prior to admit for past several months with [facility] lift in OP ASH, HD unit 2. Large Right Pleural Effusion- S/p thoracentesis .cytology had revealed mixed inflammatory cells, reactive appearing mesothelial cells 3 ESRD - TTS [place], HD NEEDS To sit in [place] for op HD , now refusing hd will let Palliative /Care continue care 4. Anemia - hgb 10.2 aranesp 50 q Tues HD week. No Fe.  5. Secondary hyperparathyroidism - phos 5.2>2.2 Will dc Fosrenal, poor appetite , watch Phos. Ca+ 10.5 corrected, No vit d / use 2.0 ca bath hd 7. HTN/volume - Bp 129/41 for hd today and ON amlodipine 10mg  hs and metop.50mg  bid now below edw / with hd and pl effusion thoracentesis . Decreased Po/  8 Nutrition - Supplements. Multivit. Renal diet  Ernest Haber, PA-C [facility] Beeper [phone] 12/19/2013,8:04 AM  LOS: 13 days  I have seen and examined this patient and agree with plan per Ernest Haber.  Pt refused HD and is refusing meds.  Her QOL is  very poor and palliative care would be reasonable.  Son needs to come and talk to her.  He wants everything done but that may not be realistic given her cognitive deficiencies.  I cannot dialyse her if she fights the nurse who is trying to put her on the machine.Marland Kitchen Robley Matassa T,MD 12/19/2013 10:22 AM Labs: Basic Metabolic Panel:  Recent Labs Lab 12/14/13 1420  12/16/13 0615 12/17/13 2258 12/18/13 0543  NA 127*  < > 132* 136* 134*  K 4.2  < > 4.5 3.7 4.5  CL 85*  < > 90* 92* 91*  CO2 26  < > 26 27 26   GLUCOSE 357*  < > 144* 209* 360*  BUN 33*  < > 37* 24* 34*  CREATININE 3.60*  < > 3.22* 2.23* 2.64*  CALCIUM 9.1  < > 9.1 8.8 8.8  PHOS 2.2*  --   --  2.2*  --   < > = values in this interval not displayed. Liver Function Tests:  Recent Labs Lab 12/12/13 1000 12/14/13 1420 12/17/13 2258 12/18/13 0543  AST 29  --   --  51*  ALT 28  --   --  103*  ALKPHOS 160*  --   --  157*  BILITOT 0.4  --   --  0.3  PROT 7.2  --   --  6.6  ALBUMIN 2.1* 2.2* 2.3* 2.2*  CBC:  Recent Labs Lab 12/12/13 1000 12/14/13 1419 12/15/13 0725 12/17/13 0947 12/18/13 0543  WBC 11.5* 13.3* 12.6* 16.6* 18.0*  HGB 9.3* 9.3* 9.7* 10.4* 10.2*  HCT 29.7* 28.4* 31.0* 31.3* 32.2*  MCV 94.9 92.2 93.7 92.3 94.2  PLT 459* 521* 454* 487* 452*  CBG:  Recent Labs Lab 12/17/13 2155 12/18/13 0751 12/18/13 1153 12/18/13 1736 12/18/13 2156  GLUCAP 158* 375* 350* 327* 313*    Studies/Results: Ir Fluoro Guide Ndl Plmt / Bx  12/18/2013   CLINICAL DATA:  Severe low back pain, osteomyelitis at T10.  EXAM: IR FLUORO GUIDE NEEDLE PLACEMENT /BIOPSY  ANESTHESIA/SEDATION: Conscious sedation.  MEDICATIONS: Versed 2 mg IV.  Fentanyl 75 mcg IV.  0.25 mg IV Romazicon.  CONTRAST:  None.  PROCEDURE: Following a full explanation of the procedure along with the potential associated complications, an informed witnessed consent was obtained.  The patient was laid prone on the fluoroscopic table.  The skin overlying  the thoracic region was then prepped and draped in the usual sterile fashion. The skin entry site at T10 was then infiltrated with 0.25% bupivacaine into the right pedicle.  Using biplane intermittent fluoroscopy, an 11 gauge Jamshidi needle was then advanced into the posterior one-third at T10. This was then exchanged out for the Kyphon advanced osteo introducer system comprising of a working and a Engineer, manufacturing systems.  The combination was then advanced over a Kyphon osteo bone pin until the Kyphon osteo drill was in the posterior one-third at T10. At this time the bone pin was removed. In a medial trajectory, the combination was then advanced until the [place] working cannula was in the posterior one-third at T10.  At this time the osteo drill was removed and a core sample sent for microbiologic analysis. Through the working cannula, a Kyphon bone biopsy device was then advanced to within 5 mm of the anterior aspect ofT10. Using a 20 mL syringe, another biopsy sample was then obtained and sent for microbiology analysis. The working cannula was then removed. Hemostasis was achieved at the skin entry site.  The patient tolerated the procedure well.  COMPLICATIONS: None immediate.  FINDINGS: As above.  IMPRESSION: Status post fluoroscopic guided needle placement for deep core bone biopsy and aspiration at T10 as described without event.   Electronically Signed   By: Luanne Bras M.D.   On: 12/17/2013 13:05   Medications:   . amLODipine  10 mg Oral Daily  . aspirin EC  81 mg Oral Daily  . atorvastatin  40 mg Oral QHS  . collagenase   Topical Daily  . darbepoetin (ARANESP) injection - DIALYSIS  50 mcg Intravenous Q Tue-HD  . dexamethasone  4 mg Oral Daily  . docusate sodium  100 mg Oral BID  . DULoxetine  30 mg Oral Daily  . feeding supplement (NEPRO CARB STEADY)  237 mL Oral Q1500  . feeding supplement (RESOURCE BREEZE)  1 Container Oral BID BM  . fentaNYL  25 mcg Transdermal Q72H  . gabapentin  100  mg Oral Once per day on Mon Wed Fri  . insulin aspart  0-15 Units Subcutaneous TID WC  . insulin glargine  20 Units Subcutaneous QHS  . methimazole  20 mg Oral TID  . metoprolol tartrate  50 mg Oral BID  . multivitamin  1 tablet Oral QHS  . pantoprazole  40 mg Oral Daily  . polyethylene glycol  17 g Oral Daily  .  ranolazine  500 mg Oral BID  . senna-docusate  2 tablet Oral QHS  . sodium chloride  3 mL Intravenous Q12H  . traZODone  25 mg Oral QHS

## 2013-12-19 NOTE — Discharge Summary (Signed)
Holly Ellison, 73 y.o., DOB Jan 16, 1941, MRN [id number]. Admission date: 12/06/2013 Discharge Date 12/19/2013 Primary MD [name], MD Admitting Physician Toy Baker, MD  Admission Diagnosis  Somnolence [R40.0] Altered mental status [R41.82] Pain [R52] Hypoxia [R09.02] ESRD on dialysis [N18.6, Z99.2] Type 2 diabetes mellitus with diabetic chronic kidney disease [E11.22, N18.9] Compression of spinal cord with myelopathy [G95.20]  Discharge Diagnosis   Principal Problem:   Fracture of thoracic spine with cord lesion Active Problems:   Atherosclerosis of native arteries of the extremities with ulceration   CAD (coronary artery disease) of bypass graft   Diabetes mellitus   Hypertension   COPD (chronic obstructive pulmonary disease)   Hyperthyroidism   ESRD on dialysis   Paraparesis   Compression of spinal cord with myelopathy   Pleural effusion   Altered mental state   Pain   Acute delirium   Palliative care encounter   CN (constipation)   Acute respiratory failure with hypoxia   Somnolence   Type 2 diabetes mellitus with diabetic chronic kidney disease   Pressure ulcer   Normocytic anemia   Poor dentition   Unintentional weight loss   Pleural effusion on left   Back pain   Staphylococcus aureus bacteremia   Past Medical History  Diagnosis Date  . Chronic kidney disease     just found out? Placed graft in [facility]-Dr. Tarri Glenn in [facility].  started dialysis in 06/2011  . Arthritis     Osteoarthritis  . Neuropathy in diabetes   . Insomnia   . Asthma   . CAD (coronary artery disease) 2012    7 stents-First PCI was in [place].  The last time she had a procedure was  a stent last year, thena  CABG was undertaken in October [facility])  . GERD (gastroesophageal reflux disease)   . Diabetes mellitus     Type II  . COPD (chronic obstructive pulmonary disease)   . Hypertension   . Peripheral vascular disease     endarterectomy  . Anemia   . CHF  (congestive heart failure)   . Coronary artery dilation   . Myocardial infarction 2013    x 3  . 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 H/O Bell's palsy   . Frequent headaches   . Orthostasis     Past Surgical History  Procedure Laterality Date  . Abdominal hysterectomy    . Coronary artery bypass graft  2012    7 stents  . Cea      Left  . Carpal tunnel release    . Av fistula placement    . Grave's disease      ? unclea rif ca or not   . Laminectomy N/A 07/16/2013    Procedure: THORACIC LAMINECTOMY FOR TUMOR;  Surgeon: Erline Levine, MD;  Location: [facility];  Service: Neurosurgery;  Laterality: N/A;   Admission history of present illness/brief narrative: Holly Ellison is a 73 y.o. female   has a past medical history of Chronic kidney disease; Arthritis; Neuropathy in diabetes; Insomnia; Asthma; CAD (coronary artery disease) (2012); GERD (gastroesophageal reflux disease); Diabetes mellitus; COPD (chronic obstructive pulmonary disease); Hypertension; Peripheral vascular disease; Anemia; CHF (congestive heart failure); Coronary artery dilation; Myocardial infarction (2013); Colloid cyst of brain; H/O Bell's palsy; Frequent headaches; and Orthostasis.   Presented with  Patient has long-standing history of end-stage renal disease with poor compliance and her dialysis schedule, diabetes, coronary artery disease. Per history she has had  unknown duration of paraplegia secondary to unknown mass lesion that causing spinal cord compression status post decompressive laminectomy now had progressive worsening weakness had done an outpatient MRI scan showed a progressive pathologic fracture and collapse as well as worsening cord compression and cord edema, patient was evaluated by neurosurgery at ED, she was thought not to be surgical candidate,  She was started on Decadron initially for pain relief decrease the swelling, patient had CT-guided biopsy of the lesion to evaluate for any  infectious process, with all cultures are negative so far, but patient was started on IV vancomycin total of 8 weeks by infectious disease given her history of emesis is a bacteremia in the past,and to cover for gram-negative staph which is difficult to isolate.as T1 disease thought to be a bacterial infection or vertebral spine, As well patient was noticed to have pleural effusion, which was drained, with cultures are negative, there was a suspicion for TB, so patient had TB PCR from pleural effusion still pending., which is still pending, but it by infectious disease to be very low probability.  (Patient will need to finish IV vancomycin total of 8 weeks start date 12/19/13)   Hospital Course See H&P, Labs, Consult and Test reports for all details in brief, patient was admitted for **  Principal Problem:   Fracture of thoracic spine with cord lesion Active Problems:   Atherosclerosis of native arteries of the extremities with ulceration   CAD (coronary artery disease) of bypass graft   Diabetes mellitus   Hypertension   COPD (chronic obstructive pulmonary disease)   Hyperthyroidism   ESRD on dialysis   Paraparesis   Compression of spinal cord with myelopathy   Pleural effusion   Altered mental state   Pain   Acute delirium   Palliative care encounter   CN (constipation)   Acute respiratory failure with hypoxia   Somnolence   Type 2 diabetes mellitus with diabetic chronic kidney disease   Pressure ulcer   Normocytic anemia   Poor dentition   Unintentional weight loss   Pleural effusion on left   Back pain   Staphylococcus aureus bacteremia  Paraparesis - MRI thoracic spine was done as outpatient which showed progressive pathologic fracture and collapse as well as worsening cord compression and cord edema, neurosurgery evaluated the patient in the ED and patient was deemed as non operative candidate. Palliative care has seen the patient and started on Decadron to reduce the cord  edema as well as better pain control.Decadron was stopped on discharge, CT guided biopsy 12/17/13 as per ID recommendation to r/o possible infectious cause.culture has no growth so far, patient was started on IV vancomycin on 12/19/13, as had pathologic fracture was felt to be secondary to infectious process with known history of MSSA bacteremia, as well to cover for coag negative staph which is hard to isolate.  Leukocytosis -Patient is a febrile, this is most likely related to steroid use.  Chronic back pain - patient has chronic back pain from the progressive pathologic fracture of the T10-11 vertebrae, continue baclofen 10 mg every 12 hours when necessary,fentanyl patch has been increased to 25 g every 72 hours, Neurontin 100 mg 3 times weekly. .Started back on Cymbalta, Topamax and Decadron discontinued.  Pleural effusion - CT chest showed large right pleural effusion, thoracentesis was performed and cytology reveals mixed inflammatory cells, reactive appearing mesothelial cells, not likely to be TB, MTB  PCR and adenosine deaminase are pending.  Hypertension - patient's blood pressure  is elevated, despite taking 2 medications including amlodipine and metoprolol. Will increase the dose of amlodipine to 10 mg by mouth daily and increase the metoprolol, to 50 mg po BID.  Suicidal ideation - patient complained of suicidal ideation one is to one sitter was placed, and psych consult obtained.Psych has cleared her, no suicidal ideation and discontinued the sitter. -the patient was deemed incompetent to take medical decisions, with her son is responsible for all her medical decisions.  End-stage renal disease - patient is currently on hemodialysis, she had earlier refused hemodialysis.Later after Psych and palliative care deemed her incompetent, she is now back on hemodialysis Tuesday Thursday Saturday.  Hyperthyroidism - Continue Tapazole, 10 mg po TID  Diabetes Mellitus -patient  blood glucose was uncontrolled as she was on steroids, she is being discharged onLantus and insulin sliding scale  ? Underlying malignancy - All the workup for malignancy so far is negative including the cytology from the pleural fluid,CT chest, abdomen and pelvis were obtained which did not show any tumor or metastasis.SPEP,upep still  Chest pain andpending.   Consultants:  Nephrology  Neurology   Palliative care  Infectious disease Procedures:  Thoracentesis  CT-guided biopsy 12/17/13 Antibiotics:  IV vancomycin started on 12/19/13 Results for orders placed or performed during the hospital encounter of 12/06/13  MRSA PCR Screening     Status: None   Collection Time: 12/07/13  1:03 AM  Result Value Ref Range Status   MRSA by PCR NEGATIVE NEGATIVE Final    Comment:        The GeneXpert MRSA Assay (FDA approved for NASAL specimens only), is one component of a comprehensive MRSA colonization surveillance program. It is not intended to diagnose MRSA infection nor to guide or monitor treatment for MRSA infections.  Culture, blood (routine x 2)     Status: None   Collection Time: 12/07/13 10:52 AM  Result Value Ref Range Status   Specimen Description BLOOD RIGHT HAND  Final   Special Requests BOTTLES DRAWN AEROBIC ONLY 1CC  Final   Culture  Setup Time   Final    12/07/2013 20:10 Performed at [facility]    Culture   Final    NO GROWTH 5 DAYS Performed at [facility]    Report Status 12/13/2013 FINAL  Final  Culture, blood (routine x 2)     Status: None   Collection Time: 12/07/13 11:00 AM  Result Value Ref Range Status   Specimen Description BLOOD RIGHT HAND  Final   Special Requests BOTTLES DRAWN AEROBIC ONLY 5CC  Final   Culture  Setup Time   Final    12/07/2013 20:10 Performed at [facility]    Culture   Final    NO GROWTH 5 DAYS Performed at [facility]    Report Status 12/13/2013 FINAL  Final  Fungus  Culture with Smear     Status: None (Preliminary result)   Collection Time: 12/09/13  1:28 PM  Result Value Ref Range Status   Specimen Description FLUID RIGHT PLEURAL  Final   Special Requests NONE  Final   Fungal Smear   Final    NO YEAST OR FUNGAL ELEMENTS SEEN Performed at [facility]    Culture   Final    CULTURE IN PROGRESS FOR FOUR WEEKS Performed at [facility]    Report Status PENDING  Incomplete  Body fluid culture     Status: None   Collection Time: 12/09/13  1:28 PM  Result Value Ref Range Status   Specimen Description FLUID RIGHT PLEURAL  Final   Special Requests NONE  Final   Gram Stain   Final    RARE WBC PRESENT, PREDOMINANTLY MONONUCLEAR NO ORGANISMS SEEN Performed at [facility]    Culture   Final    NO GROWTH 3 DAYS Performed at [facility]    Report Status 12/13/2013 FINAL  Final  AFB culture with smear     Status: None (Preliminary result)   Collection Time: 12/09/13  1:28 PM  Result Value Ref Range Status   Specimen Description FLUID RIGHT PLEURAL  Final   Special Requests NONE  Final   Acid Fast Smear   Final    NO ACID FAST BACILLI SEEN Performed at [facility]    Culture   Final    CULTURE WILL BE EXAMINED FOR 6 WEEKS BEFORE ISSUING A FINAL REPORT Performed at [facility]    Report Status PENDING  Incomplete  Fungus Culture with Smear     Status: None (Preliminary result)   Collection Time: 12/17/13 12:23 PM  Result Value Ref Range Status   Specimen Description TISSUE BONE BIOPSY  Final   Special Requests NONE  Final   Fungal Smear   Final    NO YEAST OR FUNGAL ELEMENTS SEEN Performed at [facility]    Culture   Final    CULTURE IN PROGRESS FOR FOUR WEEKS Performed at [facility]    Report Status PENDING  Incomplete  Tissue culture     Status: None (Preliminary result)   Collection Time: 12/17/13 12:23 PM  Result Value Ref Range Status   Specimen Description  TISSUE BONE BIOPSY  Final   Special Requests NONE  Final   Gram Stain   Final    RARE WBC PRESENT,BOTH PMN AND MONONUCLEAR NO ORGANISMS SEEN Performed at [facility]    Culture   Final    NO GROWTH 2 DAYS Performed at [facility]    Report Status PENDING  Incomplete  AFB culture with smear     Status: None (Preliminary result)   Collection Time: 12/17/13 12:23 PM  Result Value Ref Range Status   Specimen Description TISSUE BONE BIOPSY  Final   Special Requests NONE  Final   Acid Fast Smear   Final    NO ACID FAST BACILLI SEEN Performed at [facility]    Culture   Final    CULTURE WILL BE EXAMINED FOR 6 WEEKS BEFORE ISSUING A FINAL REPORT Performed at [facility]    Report Status PENDING  Incomplete     Significant Tests:  See full reports for all details    Dg Chest 1 View  12/14/2013   CLINICAL DATA:  Post right-sided thoracentesis.  EXAM: CHEST - 1 VIEW  COMPARISON:  12/10/2013; 12/09/2013  FINDINGS: Grossly unchanged enlarged cardiac silhouette and mediastinal contours post median sternotomy and CABG. Interval reduction / near resolution of right-sided pleural effusion post thoracentesis. No pneumothorax. Overall improved aeration of lungs with persistent bibasilar opacities, right greater than left, likely atelectasis. No new focal airspace opacities. Grossly unchanged bones. Radiopaque material is seen within the gastric fundus.  IMPRESSION: 1. Interval reduction/near resolution of right-sided effusion post thoracentesis. No pneumothorax. 2. Overall improved aeration of the lungs suggests resolving edema and atelectasis. 3. Residual right basilar opacities, favored to represent atelectasis.   Electronically Signed   By: Eldridge Abrahams.D.  On: 12/14/2013 11:15   Dg Chest 1 View  12/09/2013   CLINICAL DATA:  Post thoracentesis.  Back pain  EXAM: CHEST - 1 VIEW  COMPARISON:  12/06/2013  FINDINGS: Decreasing right pleural effusion following  right thoracentesis. Small right pleural effusion persists. Airspace disease throughout the right lung which could represent edema or infection. Increasing airspace disease on the left. Heart is borderline in size. No visible left effusion. No pneumothorax following thoracentesis.  IMPRESSION: Decreasing right effusion following thoracentesis.  No pneumothorax.  Bilateral airspace disease, right greater than left. This is increased on the left since prior study. This could represent asymmetric edema or infection.   Electronically Signed   By: Rolm Baptise M.D.   On: 12/09/2013 13:51   Ct Head Wo Contrast  12/06/2013   CLINICAL DATA:  Altered mental status.  Headache.  EXAM: CT HEAD WITHOUT CONTRAST  TECHNIQUE: Contiguous axial images were obtained from the base of the skull through the vertex without intravenous contrast.  COMPARISON:  11/04/2012  FINDINGS: There is no evidence of intracranial hemorrhage, brain edema, or other signs of acute infarction. There is no evidence of intracranial mass lesion or mass effect. No abnormal extraaxial fluid collections are identified.  Generalized ventriculomegaly remains stable. No other intracranial abnormality identified. Old right frontal craniotomy or ventriculostomy defect again noted.  IMPRESSION: No acute intracranial abnormality. Stable ventriculomegaly and old right frontal craniotomy/ventriculostomy defect.   Electronically Signed   By: Earle Gell M.D.   On: 12/06/2013 18:15   Ct Chest W Contrast  12/07/2013   CLINICAL DATA:  Right pleural effusion.  Back pain.  EXAM: CT CHEST AND ABDOMEN WITH CONTRAST  TECHNIQUE: Multidetector CT imaging of the chest and abdomen was performed following the standard protocol during bolus administration of intravenous contrast.  CONTRAST:  137mL OMNIPAQUE IOHEXOL 300 MG/ML  SOLN  COMPARISON:  CT scans dated 07/17/2013 and 07/10/2013 and MRI of thoracic spine dated 12/06/2013  FINDINGS: CT CHEST FINDINGS  There is a large  right pleural effusion with compressive atelectasis of the entire right lower lobe and of most of the right upper and middle lobes.  There is a tiny left effusion.  Heart size is normal.  There is a destructive process involving the T9, T10 and T11 vertebrae with extension of abnormal soft tissue into the spinal canal. There is prominent paraspinal soft tissue reaction. The patient has had previous posterior decompression at T10-11 and T11-12 and T12-L1.  The patient has developed diffuse anasarca. There is an 11 mm dense nodule in the left breast. The patient has had a previous left breast biopsy.  CT ABDOMEN FINDINGS  The liver, spleen, pancreas, adrenal glands, and kidneys appear normal. Biliary tree is normal. The bowel is normal. Ovaries are normal. Uterus has been removed. No acute abnormality of the lumbar spine.  IMPRESSION: 1. Progressive destructive process involving the T9, T10, and T11 vertebra. Extension of abnormal soft tissue into the spinal canal at that level which probably and compresses the spinal cord. 2. Secondary extensive soft tissue reaction in the adjacent soft tissues. 3. Large right pleural effusion with compressive atelectasis the right lung. I suspect the effusion is due to the process in the lower thoracic spine. The biopsy at the time of decompression demonstrate no evidence of tumor. The possibility of focal amyloid deposition should be considered. Infection could also give this appearance although the biopsy did not demonstrate any evidence of infection at that time. 4. Negative CT scan of the abdomen.  Electronically Signed   By: Rozetta Nunnery M.D.   On: 12/07/2013 22:18   Ct Abdomen Pelvis W Contrast  12/13/2013   ADDENDUM REPORT: 12/13/2013 08:57  ADDENDUM: Regarding the area of luminal narrowing and mild wall thickening in the sigmoid colon, this does not appear overtly concerning for neoplasm on today's study although it is somewhat unusual for this same area to be narrow on  the most recent comparison is well. This region appear normal on the CT scan before. It may represent area of evolving stenosis although there is no substantial diverticular change in the region. As warranted in this individual, sigmoidoscopy may prove helpful to further evaluate.   Electronically Signed   By: Misty Stanley M.D.   On: 12/13/2013 08:57   12/13/2013   CLINICAL DATA:  Subsequent encounter for thoracic spine mass.  EXAM: CT ABDOMEN AND PELVIS WITH CONTRAST  TECHNIQUE: Multidetector CT imaging of the abdomen and pelvis was performed using the standard protocol following bolus administration of intravenous contrast.  CONTRAST:  167mL OMNIPAQUE IOHEXOL 300 MG/ML  SOLN  COMPARISON:  12/07/1948 year 07/10/2013.  FINDINGS: Lower chest: The right pleural effusion has decreased in the interval and is now moderate in size. There is bibasilar collapse/ consolidation in the dependent lower lobes.  Hepatobiliary: No focal abnormality within the liver parenchyma. Layering tiny stones are seen in the gallbladder lumen. No gallbladder wall thickening or pericholecystic fluid. No intrahepatic or extrahepatic biliary dilation.  Pancreas: No focal mass lesion. No dilatation of the main duct. No intraparenchymal cyst. No peripancreatic edema.  Spleen: No splenomegaly. No focal mass lesion.  Adrenals/Urinary Tract: No adrenal nodule or mass. There is no evidence for hydronephrosis or renal mass lesion. The urinary bladder is distended.  Stomach/Bowel: Stomach is nondistended. No gastric wall thickening. No evidence of outlet obstruction. Duodenum is normally positioned as is the ligament of Treitz. No small bowel wall thickening. No small bowel dilatation. Terminal ileum is normal. The appendix is normal.  On image 65 of series 2, there is a focal area of narrowing in the sigmoid colon. While this may be related to peristalsis, there are no other similar appearing areas in the colon and there was similar luminal narrowing  with wall thickening at this location on the study from 12/07/2013 although the appearance was less prominent on the study from 07/10/2013.  Vascular/Lymphatic: Atherosclerotic calcification is noted in the wall of the abdominal aorta without aneurysm. There is no gastrohepatic or hepatoduodenal ligament lymphadenopathy. Portal vein is patent. No retroperitoneal lymphadenopathy. There is no pelvic sidewall lymphadenopathy.  Reproductive: Uterus is surgically absent. There is no adnexal mass.  Other: Diffuse body wall edema noted. No intraperitoneal free fluid.  Musculoskeletal: The abnormal appearance of T9, T10, and T11 is again noted with near complete light cysts of the T10 vertebral body and most of the T11 vertebral body. Comparing to 10/30 1/15, the bony anatomy in this region is relatively stable in appearance however there has been an interval increase in abnormal soft tissue seen anterior to the T10 and T11 vertebral bodies.  IMPRESSION: Relatively stable appearance of the T9, T10, and T11 vertebral bodies although there is been interval increase in the abnormal paraspinal soft tissue at this level. The increase is evident, but very subtle on axial imaging, and best appreciated on the sagittal reconstructions. This region was biopsied during laminectomy on 07/16/2013 and pathology report at that time found no evidence of malignancy.  Interval decrease in the moderate size right pleural  effusion with associated bibasilar collapse/ consolidation.  Cholelithiasis.  Diffuse body wall edema.  Electronically Signed: By: Misty Stanley M.D. On: 12/13/2013 08:33   Ct Abdomen Pelvis W Contrast  12/07/2013   CLINICAL DATA:  Right pleural effusion.  Back pain.  EXAM: CT CHEST AND ABDOMEN WITH CONTRAST  TECHNIQUE: Multidetector CT imaging of the chest and abdomen was performed following the standard protocol during bolus administration of intravenous contrast.  CONTRAST:  139mL OMNIPAQUE IOHEXOL 300 MG/ML  SOLN   COMPARISON:  CT scans dated 07/17/2013 and 07/10/2013 and MRI of thoracic spine dated 12/06/2013  FINDINGS: CT CHEST FINDINGS  There is a large right pleural effusion with compressive atelectasis of the entire right lower lobe and of most of the right upper and middle lobes.  There is a tiny left effusion.  Heart size is normal.  There is a destructive process involving the T9, T10 and T11 vertebrae with extension of abnormal soft tissue into the spinal canal. There is prominent paraspinal soft tissue reaction. The patient has had previous posterior decompression at T10-11 and T11-12 and T12-L1.  The patient has developed diffuse anasarca. There is an 11 mm dense nodule in the left breast. The patient has had a previous left breast biopsy.  CT ABDOMEN FINDINGS  The liver, spleen, pancreas, adrenal glands, and kidneys appear normal. Biliary tree is normal. The bowel is normal. Ovaries are normal. Uterus has been removed. No acute abnormality of the lumbar spine.  IMPRESSION: 1. Progressive destructive process involving the T9, T10, and T11 vertebra. Extension of abnormal soft tissue into the spinal canal at that level which probably and compresses the spinal cord. 2. Secondary extensive soft tissue reaction in the adjacent soft tissues. 3. Large right pleural effusion with compressive atelectasis the right lung. I suspect the effusion is due to the process in the lower thoracic spine. The biopsy at the time of decompression demonstrate no evidence of tumor. The possibility of focal amyloid deposition should be considered. Infection could also give this appearance although the biopsy did not demonstrate any evidence of infection at that time. 4. Negative CT scan of the abdomen.   Electronically Signed   By: Rozetta Nunnery M.D.   On: 12/07/2013 22:18   Ir Fluoro Guide Ndl Plmt / Bx  12/18/2013   CLINICAL DATA:  Severe low back pain, osteomyelitis at T10.  EXAM: IR FLUORO GUIDE NEEDLE PLACEMENT /BIOPSY   ANESTHESIA/SEDATION: Conscious sedation.  MEDICATIONS: Versed 2 mg IV.  Fentanyl 75 mcg IV.  0.25 mg IV Romazicon.  CONTRAST:  None.  PROCEDURE: Following a full explanation of the procedure along with the potential associated complications, an informed witnessed consent was obtained.  The patient was laid prone on the fluoroscopic table.  The skin overlying the thoracic region was then prepped and draped in the usual sterile fashion. The skin entry site at T10 was then infiltrated with 0.25% bupivacaine into the right pedicle.  Using biplane intermittent fluoroscopy, an 11 gauge Jamshidi needle was then advanced into the posterior one-third at T10. This was then exchanged out for the Kyphon advanced osteo introducer system comprising of a working and a Engineer, manufacturing systems.  The combination was then advanced over a Kyphon osteo bone pin until the Kyphon osteo drill was in the posterior one-third at T10. At this time the bone pin was removed. In a medial trajectory, the combination was then advanced until the Oakley working cannula was in the posterior one-third at T10.  At this time  the osteo drill was removed and a core sample sent for microbiologic analysis. Through the working cannula, a Kyphon bone biopsy device was then advanced to within 5 mm of the anterior aspect ofT10. Using a 20 mL syringe, another biopsy sample was then obtained and sent for microbiology analysis. The working cannula was then removed. Hemostasis was achieved at the skin entry site.  The patient tolerated the procedure well.  COMPLICATIONS: None immediate.  FINDINGS: As above.  IMPRESSION: Status post fluoroscopic guided needle placement for deep core bone biopsy and aspiration at T10 as described without event.   Electronically Signed   By: Luanne Bras M.D.   On: 12/17/2013 13:05   Dg Chest Port 1 View  12/10/2013   CLINICAL DATA:  Shortness of breath and pleural effusion.  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 Cardiac silhouette remains mildly enlarged. Small right pleural effusion is stable to minimally increased in size. There is mild pulmonary vascular congestion with right greater than left lung airspace opacities which have overall mildly increased from the prior study. There is new partial silhouetting of the left hemidiaphragm. Trace left pleural effusion is questioned. No pneumothorax is identified.  IMPRESSION: 1. Stable to minimally increased size of small right pleural effusion. 2. Mildly increased right greater than left lung airspace disease, which may reflect asymmetric edema (including re-expansion pulmonary edema on the right related to recent thoracentesis) although infection is not excluded.   Electronically Signed   By: Logan Bores   On: 12/10/2013 08:19   Dg Chest Portable 1 View  12/06/2013   CLINICAL DATA:  Initial encounter for 1 day history of respiratory distress  EXAM: PORTABLE CHEST - 1 VIEW  COMPARISON:  Chest x-ray from 04/02/2013.  Chest CT from 07/17/2013.  FINDINGS: 1624 hrs. There is a large right pleural effusion with diffuse airspace disease within the visualized portions of the right lung. No left-sided pleural effusion. No pulmonary edema in the left lung. Cardiopericardial silhouette is enlarged. Patient is status post CABG. Telemetry leads overlie the chest.  IMPRESSION: New large right-sided pleural effusion. Unilateral pleural effusion can be a sign of malignancy.   Electronically Signed   By: Misty Stanley M.D.   On: 12/06/2013 16:45   US Thoracentesis Asp Pleural Space W/img Guide  12/14/2013   INDICATION: Symptomatic RIGHT sided pleural effusion  EXAM: US THORACENTESIS ASP PLEURAL SPACE W/IMG GUIDE  COMPARISON:  Previous thoracentesis  MEDICATIONS: 10 cc 1% lidocaine  COMPLICATIONS: None immediate  TECHNIQUE: Informed written consent was obtained from the patient after a discussion of the risks, benefits and  alternatives to treatment. A timeout was performed prior to the initiation of the procedure.  Initial ultrasound scanning demonstrates a right pleural effusion. The lower chest was prepped and draped in the usual sterile fashion. 1% lidocaine was used for local anesthesia.  Under direct ultrasound guidance, a 19 gauge, 7-cm, Yueh catheter was introduced. An ultrasound image was saved for documentation purposes. The thoracentesis was performed. The catheter was removed and a dressing was applied. The patient tolerated the procedure well without immediate post procedural complication. The patient was escorted to have an upright chest radiograph.  FINDINGS: A total of approximately 1 liters of yellow fluid was removed. Requested samples were sent to the laboratory.  IMPRESSION: Successful ultrasound-guided R sided thoracentesis yielding 1 liters of pleural fluid.  Read by:  Lavonia Drafts [facility]   Electronically Signed  By: Sandi Mariscal M.D.   On: 12/14/2013 11:01   US Thoracentesis Asp Pleural Space W/img Guide  12/09/2013   CLINICAL DATA:  Right pleural effusion  EXAM: ULTRASOUND GUIDED right THORACENTESIS  COMPARISON:  None.  PROCEDURE: An ultrasound guided thoracentesis was thoroughly discussed with the patient and questions answered. The benefits, risks, alternatives and complications were also discussed. The patient understands and wishes to proceed with the procedure. Written consent was obtained.  Ultrasound was performed to localize and mark an adequate pocket of fluid in the right chest. The area was then prepped and draped in the normal sterile fashion. 1% Lidocaine was used for local anesthesia. Under ultrasound guidance a 19 gauge Yueh catheter was introduced. Thoracentesis was performed. The catheter was removed and a dressing applied.  Complications:  None  FINDINGS: A total of approximately 1.2 L of dark yellow fluid was removed. A fluid sample wassent for laboratory analysis.  IMPRESSION: Successful  ultrasound guided right thoracentesis yielding 1.2 L of pleural fluid.  Read by:  Lavonia Drafts [facility]   Electronically Signed   By: Daryll Brod M.D.   On: 12/09/2013 14:32     Today   Subjective:   Holly Ellison today has no headache,no chest abdominal pain,no new weakness tingling or numbness,  Objective:   Blood pressure 136/64, pulse 74, temperature 99 F (37.2 C), temperature source Oral, resp. rate 18, height 5\' 7"  (1.702 m), weight 67.9 kg (149 lb 11.1 oz), SpO2 90 %.  Intake/Output Summary (Last 24 hours) at 12/19/13 1413 Last data filed at 12/18/13 1510  Gross per 24 hour  Intake      0 ml  Output      0 ml  Net      0 ml    Exam  Head: Normocephalic, atraumatic.  Eyes: No signs of jaundice, EOMI  Lungs: Normal respiratory effort. B/L Clear to auscultation, no crackles or wheezes.  Heart: Regular RR. S1 and S2 normal   Abdomen: BS normoactive. Soft, Nondistended, non-tender.   Extremities: No pretibial edema, no erythema  Data Review     CBC w Diff: Lab Results  Component Value Date   WBC 18.0* 12/18/2013   HGB 10.2* 12/18/2013   HCT 32.2* 12/18/2013   PLT 452* 12/18/2013   LYMPHOPCT 9* 07/17/2013   MONOPCT 2* 07/17/2013   EOSPCT 0 07/17/2013   BASOPCT 0 07/17/2013   CMP: Lab Results  Component Value Date   NA 134* 12/18/2013   K 4.5 12/18/2013   CL 91* 12/18/2013   CO2 26 12/18/2013   BUN 34* 12/18/2013   CREATININE 2.64* 12/18/2013   PROT 6.6 12/18/2013   ALBUMIN 2.2* 12/18/2013   BILITOT 0.3 12/18/2013   ALKPHOS 157* 12/18/2013   AST 51* 12/18/2013   ALT 103* 12/18/2013  .  Micro Results Recent Results (from the past 240 hour(s))  Fungus Culture with Smear     Status: None (Preliminary result)   Collection Time: 12/17/13 12:23 PM  Result Value Ref Range Status   Specimen Description TISSUE BONE BIOPSY  Final   Special Requests NONE  Final   Fungal Smear   Final    NO YEAST OR FUNGAL ELEMENTS SEEN Performed at [facility]    Culture   Final    CULTURE IN PROGRESS FOR FOUR WEEKS Performed at [facility]    Report Status PENDING  Incomplete  Tissue culture     Status: None (Preliminary result)   Collection Time:  12/17/13 12:23 PM  Result Value Ref Range Status   Specimen Description TISSUE BONE BIOPSY  Final   Special Requests NONE  Final   Gram Stain   Final    RARE WBC PRESENT,BOTH PMN AND MONONUCLEAR NO ORGANISMS SEEN Performed at [facility]    Culture   Final    NO GROWTH 2 DAYS Performed at [facility]    Report Status PENDING  Incomplete  AFB culture with smear     Status: None (Preliminary result)   Collection Time: 12/17/13 12:23 PM  Result Value Ref Range Status   Specimen Description TISSUE BONE BIOPSY  Final   Special Requests NONE  Final   Acid Fast Smear   Final    NO ACID FAST BACILLI SEEN Performed at [facility]    Culture   Final    CULTURE WILL BE EXAMINED FOR 6 WEEKS BEFORE ISSUING A FINAL REPORT Performed at [facility]    Report Status PENDING  Incomplete     Discharge Instructions     Continue with IV vancomycin for total of 8 weeks, accepting facility to dose and monitor.   Discharge Medications     Medication List    STOP taking these medications        albuterol 108 (90 BASE) MCG/ACT inhaler  Commonly known as:  PROVENTIL HFA;VENTOLIN HFA  Replaced by:  albuterol (2.5 MG/3ML) 0.083% nebulizer solution     ALPRAZolam 0.25 MG tablet  Commonly known as:  XANAX     amLODipine 5 MG tablet  Commonly known as:  NORVASC     LORazepam 0.5 MG tablet  Commonly known as:  ATIVAN     oxyCODONE 5 MG immediate release tablet  Commonly known as:  Oxy IR/ROXICODONE     PRESCRIPTION MEDICATION      TAKE these medications        acetaminophen 325 MG tablet  Commonly known as:  TYLENOL  Take 325-650 mg by mouth See admin instructions. Take 1 tablet (325 mg) every 4 hours as needed for mild pain, take 2  tablets (650 mg) every 4 hours as needed for severe pain     albuterol (2.5 MG/3ML) 0.083% nebulizer solution  Commonly known as:  PROVENTIL  Inhale 3 mLs into the lungs every 6 (six) hours as needed for wheezing.     aspirin 81 MG EC tablet  Take 1 tablet (81 mg total) by mouth daily.     atorvastatin 40 MG tablet  Commonly known as:  LIPITOR  Take 40 mg by mouth at bedtime.     b complex-vitamin c-folic acid 0.8 MG Tabs tablet  Take 1 tablet by mouth daily.     baclofen 10 MG tablet  Commonly known as:  LIORESAL  Take 10 mg by mouth every 12 (twelve) hours as needed for muscle spasms.     bisacodyl 10 MG suppository  Commonly known as:  DULCOLAX  Place 1 suppository (10 mg total) rectally daily at 6 (six) AM.     calcium carbonate 750 MG chewable tablet  Commonly known as:  TUMS EX  Chew 1 tablet (750 mg total) by mouth 3 (three) times daily as needed for heartburn.     cholecalciferol 1000 UNITS tablet  Commonly known as:  VITAMIN D  Take 1,000 Units by mouth daily.     clopidogrel 75 MG tablet  Commonly known as:  PLAVIX  Take 1 tablet (75 mg total) by mouth daily.  diazepam 10 MG tablet  Commonly known as:  VALIUM  Take 10 mg by mouth once. Given prior to MRI     docusate sodium 100 MG capsule  Commonly known as:  COLACE  Take 100 mg by mouth 2 (two) times daily. 8am and 4pm     DULoxetine 30 MG capsule  Commonly known as:  CYMBALTA  Take 30 mg by mouth 2 (two) times daily. 8am and 4pm     feeding supplement (NEPRO CARB STEADY) Liqd  Take 237 mLs by mouth daily at 3 pm.     fentaNYL 25 MCG/HR patch  Commonly known as:  DURAGESIC - dosed mcg/hr  Place 1 patch (25 mcg total) onto the skin every 3 (three) days.     gabapentin 100 MG capsule  Commonly known as:  NEURONTIN  Take 1 capsule (100 mg total) by mouth 3 (three) times a week.     HYDROcodone-acetaminophen 10-325 MG per tablet  Commonly known as:  NORCO  Take 1 tablet by mouth every 6 (six)  hours as needed (pain).     HYDROmorphone 2 MG tablet  Commonly known as:  DILAUDID  Take 1 tablet (2 mg total) by mouth every 2 (two) hours as needed for moderate pain or severe pain (Breakthrough Pain).     insulin aspart 100 UNIT/ML injection  Commonly known as:  novoLOG  Inject 0-15 Units into the skin 3 (three) times daily with meals.     insulin aspart 100 unit/mL injection  Commonly known as:  novoLOG  Inject 0-10 Units into the skin 3 (three) times daily before meals. Sliding scale CBG <120 0 units, 121-150 3 units, 151-180 4 units, 181-250 6 units, 251-350 8 units, 351-400 10 units     insulin glargine 100 UNIT/ML injection  Commonly known as:  LANTUS  Inject 0.1 mLs (10 Units total) into the skin daily.     insulin glargine 100 UNIT/ML injection  Commonly known as:  LANTUS  Inject 0.2 mLs (20 Units total) into the skin at bedtime.     lidocaine 5 %  Commonly known as:  LIDODERM  Place 2 patches onto the skin daily. Remove & Discard patch within 12 hours or as directed by MD     methimazole 10 MG tablet  Commonly known as:  TAPAZOLE  Take 10 mg by mouth 3 (three) times daily. 6am, 4pm, 10pm     metoprolol 50 MG tablet  Commonly known as:  LOPRESSOR  Take 1 tablet (50 mg total) by mouth 2 (two) times daily.     pantoprazole 40 MG tablet  Commonly known as:  PROTONIX  Take 40 mg by mouth daily before breakfast.     polyethylene glycol packet  Commonly known as:  MIRALAX / GLYCOLAX  Take 17 g by mouth daily.     ranolazine 500 MG 12 hr tablet  Commonly known as:  RANEXA  Take 500 mg by mouth 2 (two) times daily. 8am and 4pm     sorbitol 70 % solution  Take 30 mLs by mouth 2 (two) times daily as needed (constipation).     traZODone 50 MG tablet  Commonly known as:  DESYREL  Take 1 tablet (50 mg total) by mouth at bedtime.     UNABLE TO FIND  - Med Name: IV VANCOMYCIN  - The patient will need to continue total of 8 weeks of IV vancomycin after hemodialysis,  known history of MSSA bacteremia.     XYLOCAINE 0.5 %  injection  Generic drug:  lidocaine  Apply topically daily as needed (pain related to dialysis).         Total Time in preparing paper work, data evaluation and todays exam - 35 minutes  Mahogany Torrance M.D on 12/19/2013 at 2:13 PM  [place]  [phone]

## 2013-12-19 NOTE — Progress Notes (Signed)
Patient refusing to get chair for dialysis at this time. Patient states she does not want to go. Dialysis made aware of patient's refusal to get in chair. Dialysis states they will have to call MD. Will continue to monitor.

## 2013-12-19 NOTE — Progress Notes (Signed)
Holly Ellison for Infectious Disease    Subjective: Very unhappy states that attention to her that she is nauseous. I informed charge nurse of patient's complaints   Antibiotics:  Anti-infectives    Start     Dose/Rate Route Frequency Ordered Stop   12/18/13 1915  vancomycin (VANCOCIN) 1,500 mg in sodium chloride 0.9 % 500 mL IVPB     1,500 mg250 mL/hr over 120 Minutes Intravenous  Once 12/18/13 1911 12/18/13 2244      Medications: Scheduled Meds: . amLODipine  10 mg Oral Daily  . aspirin EC  81 mg Oral Daily  . atorvastatin  40 mg Oral QHS  . collagenase   Topical Daily  . darbepoetin (ARANESP) injection - DIALYSIS  50 mcg Intravenous Q Tue-HD  . dexamethasone  4 mg Oral Daily  . docusate sodium  100 mg Oral BID  . DULoxetine  30 mg Oral Daily  . feeding supplement (NEPRO CARB STEADY)  237 mL Oral Q1500  . feeding supplement (RESOURCE BREEZE)  1 Container Oral BID BM  . fentaNYL  25 mcg Transdermal Q72H  . gabapentin  100 mg Oral Once per day on Mon Wed Fri  . insulin aspart  0-15 Units Subcutaneous TID WC  . insulin glargine  20 Units Subcutaneous QHS  . methimazole  20 mg Oral TID  . metoprolol tartrate  50 mg Oral BID  . multivitamin  1 tablet Oral QHS  . pantoprazole  40 mg Oral Daily  . polyethylene glycol  17 g Oral Daily  . ranolazine  500 mg Oral BID  . senna-docusate  2 tablet Oral QHS  . sodium chloride  3 mL Intravenous Q12H  . traZODone  25 mg Oral QHS   Continuous Infusions:  PRN Meds:.acetaminophen **OR** acetaminophen, albuterol, ALPRAZolam, baclofen, dextrose, fentaNYL, flumazenil, hydrALAZINE, HYDROmorphone, midazolam, [DISCONTINUED] ondansetron **OR** ondansetron (ZOFRAN) IV, ondansetron    Objective: Weight change: -11 lb 14.5 oz (-5.4 kg)  Intake/Output Summary (Last 24 hours) at 12/19/13 1119 Last data filed at 12/18/13 1510  Gross per 24 hour  Intake      0 ml  Output      0 ml  Net      0 ml   Blood pressure 162/64, pulse 77,  temperature 98.7 F (37.1 C), temperature source Oral, resp. rate 18, height 5' 7" (1.702 m), weight 142 lb 10.2 oz (64.7 kg), SpO2 100 %. Temp:  [97.6 F (36.4 C)-98.7 F (37.1 C)] 98.7 F (37.1 C) (11/12 0700) Pulse Rate:  [77-93] 77 (11/12 1040) Resp:  [18] 18 (11/12 0700) BP: (141-167)/(64-68) 162/64 mmHg (11/12 1040) SpO2:  [99 %-100 %] 100 % (11/12 0700) Weight:  [142 lb 10.2 oz (64.7 kg)] 142 lb 10.2 oz (64.7 kg) (11/11 2100)  Physical Exam: General: Alert and awake, oriented x3, not in any acute distress. HEENT:EOMI CVS regular rate, normal r,  no murmur rubs or gallops Chest: clear to auscultation bilaterally, no wheezing, rales or rhonchi Abdomen: soft nontender, nondistended, normal bowel sounds, Extremities: no  clubbing or edema noted bilaterally Neuro: nonfocal  CBC:  Recent Labs Lab 12/14/13 1419 12/15/13 0725 12/17/13 0947 12/18/13 0543  HGB 9.3* 9.7* 10.4* 10.2*  HCT 28.4* 31.0* 31.3* 32.2*  PLT 521* 454* 487* 452*  INR  --   --  1.09  --   APTT  --   --  28  --      BMET  Recent Labs  12/17/13 2258 12/18/13 0543  NA 136*  134*  K 3.7 4.5  CL 92* 91*  CO2 27 26  GLUCOSE 209* 360*  BUN 24* 34*  CREATININE 2.23* 2.64*  CALCIUM 8.8 8.8     Liver Panel   Recent Labs  12/17/13 2258 12/18/13 0543  PROT  --  6.6  ALBUMIN 2.3* 2.2*  AST  --  51*  ALT  --  103*  ALKPHOS  --  157*  BILITOT  --  0.3       Sedimentation Rate  Recent Labs  12/17/13 0947  ESRSEDRATE 101*   C-Reactive Protein  Recent Labs  12/17/13 0947  CRP 0.9*    Micro Results: Recent Results (from the past 720 hour(s))  MRSA PCR Screening     Status: None   Collection Time: 12/07/13  1:03 AM  Result Value Ref Range Status   MRSA by PCR NEGATIVE NEGATIVE Final    Comment:        The GeneXpert MRSA Assay (FDA approved for NASAL specimens only), is one component of a comprehensive MRSA colonization surveillance program. It is not intended to diagnose  MRSA infection nor to guide or monitor treatment for MRSA infections.  Culture, blood (routine x 2)     Status: None   Collection Time: 12/07/13 10:52 AM  Result Value Ref Range Status   Specimen Description BLOOD RIGHT HAND  Final   Special Requests BOTTLES DRAWN AEROBIC ONLY 1CC  Final   Culture  Setup Time   Final    12/07/2013 20:10 Performed at [facility]    Culture   Final    NO GROWTH 5 DAYS Performed at [facility]    Report Status 12/13/2013 FINAL  Final  Culture, blood (routine x 2)     Status: None   Collection Time: 12/07/13 11:00 AM  Result Value Ref Range Status   Specimen Description BLOOD RIGHT HAND  Final   Special Requests BOTTLES DRAWN AEROBIC ONLY 5CC  Final   Culture  Setup Time   Final    12/07/2013 20:10 Performed at [facility]    Culture   Final    NO GROWTH 5 DAYS Performed at [facility]    Report Status 12/13/2013 FINAL  Final  Fungus Culture with Smear     Status: None (Preliminary result)   Collection Time: 12/09/13  1:28 PM  Result Value Ref Range Status   Specimen Description FLUID RIGHT PLEURAL  Final   Special Requests NONE  Final   Fungal Smear   Final    NO YEAST OR FUNGAL ELEMENTS SEEN Performed at [facility]    Culture   Final    CULTURE IN PROGRESS FOR FOUR WEEKS Performed at [facility]    Report Status PENDING  Incomplete  Body fluid culture     Status: None   Collection Time: 12/09/13  1:28 PM  Result Value Ref Range Status   Specimen Description FLUID RIGHT PLEURAL  Final   Special Requests NONE  Final   Gram Stain   Final    RARE WBC PRESENT, PREDOMINANTLY MONONUCLEAR NO ORGANISMS SEEN Performed at [facility]    Culture   Final    NO GROWTH 3 DAYS Performed at [facility]    Report Status 12/13/2013 FINAL  Final  AFB culture with smear     Status: None (Preliminary result)   Collection Time: 12/09/13  1:28 PM  Result Value Ref Range  Status   Specimen  Description FLUID RIGHT PLEURAL  Final   Special Requests NONE  Final   Acid Fast Smear   Final    NO ACID FAST BACILLI SEEN Performed at [facility]    Culture   Final    CULTURE WILL BE EXAMINED FOR 6 WEEKS BEFORE ISSUING A FINAL REPORT Performed at [facility]    Report Status PENDING  Incomplete  Fungus Culture with Smear     Status: None (Preliminary result)   Collection Time: 12/17/13 12:23 PM  Result Value Ref Range Status   Specimen Description TISSUE BONE BIOPSY  Final   Special Requests NONE  Final   Fungal Smear   Final    NO YEAST OR FUNGAL ELEMENTS SEEN Performed at [facility]    Culture   Final    CULTURE IN PROGRESS FOR FOUR WEEKS Performed at [facility]    Report Status PENDING  Incomplete  Tissue culture     Status: None (Preliminary result)   Collection Time: 12/17/13 12:23 PM  Result Value Ref Range Status   Specimen Description TISSUE BONE BIOPSY  Final   Special Requests NONE  Final   Gram Stain   Final    RARE WBC PRESENT,BOTH PMN AND MONONUCLEAR NO ORGANISMS SEEN Performed at [facility]    Culture   Final    NO GROWTH 2 DAYS Performed at [facility]    Report Status PENDING  Incomplete  AFB culture with smear     Status: None (Preliminary result)   Collection Time: 12/17/13 12:23 PM  Result Value Ref Range Status   Specimen Description TISSUE BONE BIOPSY  Final   Special Requests NONE  Final   Acid Fast Smear   Final    NO ACID FAST BACILLI SEEN Performed at [facility]    Culture   Final    CULTURE WILL BE EXAMINED FOR 6 WEEKS BEFORE ISSUING A FINAL REPORT Performed at [facility]    Report Status PENDING  Incomplete    Studies/Results: Ir Fluoro Guide Ndl Plmt / Bx  12/18/2013   CLINICAL DATA:  Severe low back pain, osteomyelitis at T10.  EXAM: IR FLUORO GUIDE NEEDLE PLACEMENT /BIOPSY  ANESTHESIA/SEDATION: Conscious sedation.  MEDICATIONS:  Versed 2 mg IV.  Fentanyl 75 mcg IV.  0.25 mg IV Romazicon.  CONTRAST:  None.  PROCEDURE: Following a full explanation of the procedure along with the potential associated complications, an informed witnessed consent was obtained.  The patient was laid prone on the fluoroscopic table.  The skin overlying the thoracic region was then prepped and draped in the usual sterile fashion. The skin entry site at T10 was then infiltrated with 0.25% bupivacaine into the right pedicle.  Using biplane intermittent fluoroscopy, an 11 gauge Jamshidi needle was then advanced into the posterior one-third at T10. This was then exchanged out for the Kyphon advanced osteo introducer system comprising of a working and a Engineer, manufacturing systems.  The combination was then advanced over a Kyphon osteo bone pin until the Kyphon osteo drill was in the posterior one-third at T10. At this time the bone pin was removed. In a medial trajectory, the combination was then advanced until the [place] working cannula was in the posterior one-third at T10.  At this time the osteo drill was removed and a core sample sent for microbiologic analysis. Through the working cannula, a Kyphon bone biopsy device was then advanced to within 5 mm of the  anterior aspect ofT10. Using a 20 mL syringe, another biopsy sample was then obtained and sent for microbiology analysis. The working cannula was then removed. Hemostasis was achieved at the skin entry site.  The patient tolerated the procedure well.  COMPLICATIONS: None immediate.  FINDINGS: As above.  IMPRESSION: Status post fluoroscopic guided needle placement for deep core bone biopsy and aspiration at T10 as described without event.   Electronically Signed   By: Luanne Bras M.D.   On: 12/17/2013 13:05      Assessment/Plan:  Principal Problem:   Fracture of thoracic spine with cord lesion Active Problems:   Atherosclerosis of native arteries of the extremities with ulceration   CAD (coronary artery  disease) of bypass graft   Diabetes mellitus   Hypertension   COPD (chronic obstructive pulmonary disease)   Hyperthyroidism   ESRD on dialysis   Paraparesis   Compression of spinal cord with myelopathy   Pleural effusion   Altered mental state   Pain   Acute delirium   Palliative care encounter   CN (constipation)   Acute respiratory failure with hypoxia   Somnolence   Type 2 diabetes mellitus with diabetic chronic kidney disease   Pressure ulcer   Normocytic anemia   Poor dentition   Unintentional weight loss   Pleural effusion on left   Back pain    Holly Ellison is a 73 y.o. female with ESRD on HD with prior MSSA bacteremia found toa vhe lytic t11 vertebral lesion sp operative decompression and biopsy with The biopsy revealed only dense fibrocartilaginous tissue without evidence for malignancy or infection. The biopsy was not sent for culture. She was afebrile and blood cultures were negative at that time. Progressive destructive process involving the T9, T10, and T11vertebra. Extension of abnormal soft tissue into the spinal canal at that level which probably and compresses the spinal cord. Secondary extensive soft tissue reaction in the adjacent soft tissues.. Large right pleural effusion that is exudative  #1 T spine disease: After reviewing her case I think this is [facility] MORE likely to represent bacterial infection of the vertebral spine likely from prior MSSA bacteremia. MTB PCR from pleural effusion and ADA pending. Esr 101.   I DONT Think this is likely TB  --GREATLY APPRECIATE  GETTING IR GUIDED ASPIRATE OF HER THORACIC VERTEBRA WITH SPECIMEN SENT FOR BACTERIAL CULTURES, AFB STAIN AND CULTURE AND FUNGAL STAIN AND CULTURE_0  --we have no growth to date so far but I DID start IV vancomycin to cover for her known MSSA and for possibility of COag neg staph or other difficult to isolate organism  --followup cultures if NO growth would plan on 8 weeks of IV vancomycin which  she can get with HD (if she will do HD)  DOES SHE REALLY NEED STEROIDS? WOULD CONSIDER GETTING RID OF THEM  #2 PLEURAL EFFUSION  NOT likely to be TB MTB PCR and adenosine deaminase are pending (TB PCR should be back in next 3-5 business days per lab should have been run at reference lab today   I will follow-up her culture data but will otherwise observe peripherally and will disengage and sign off once the bacterial ones are finalized  Please call with further questions.         LOS: 13 days   Alcide Evener 12/19/2013, 11:19 AM

## 2013-12-20 LAB — PROTEIN ELECTROPHORESIS, SERUM
ALPHA-2-GLOBULIN: 17.9 % — AB (ref 7.1–11.8)
Albumin ELP: 41.2 % — ABNORMAL LOW (ref 55.8–66.1)
Alpha-1-Globulin: 7.9 % — ABNORMAL HIGH (ref 2.9–4.9)
Beta 2: 6.7 % — ABNORMAL HIGH (ref 3.2–6.5)
Beta Globulin: 6.3 % (ref 4.7–7.2)
GAMMA GLOBULIN: 20 % — AB (ref 11.1–18.8)
M-SPIKE, %: NOT DETECTED g/dL
TOTAL PROTEIN ELP: 6.6 g/dL (ref 6.0–8.3)

## 2013-12-20 LAB — M. TUBERCULOSIS COMPLEX BY PCR: M. tuberculosis, Direct: NOT DETECTED

## 2013-12-20 LAB — MISCELLANEOUS TEST

## 2013-12-20 LAB — TISSUE CULTURE: Culture: NO GROWTH

## 2014-01-04 LAB — FUNGUS CULTURE W SMEAR: Fungal Smear: NONE SEEN

## 2014-01-07 DEATH — deceased

## 2014-01-15 LAB — FUNGUS CULTURE W SMEAR: FUNGAL SMEAR: NONE SEEN

## 2014-01-21 LAB — AFB CULTURE WITH SMEAR (NOT AT ARMC): ACID FAST SMEAR: NONE SEEN

## 2014-01-29 LAB — AFB CULTURE WITH SMEAR (NOT AT ARMC): Acid Fast Smear: NONE SEEN

## 2015-03-10 IMAGING — XA IR FLUORO GUIDE NDL PLMT / BX
4 series · 8 of 8 positions shown · non-contrast
Comparison: none

CLINICAL DATA: Severe low back pain, osteomyelitis at T10.

[Series 1: myelo · 2 of 2 slices shown (1 of 4)]
[im 1/2]
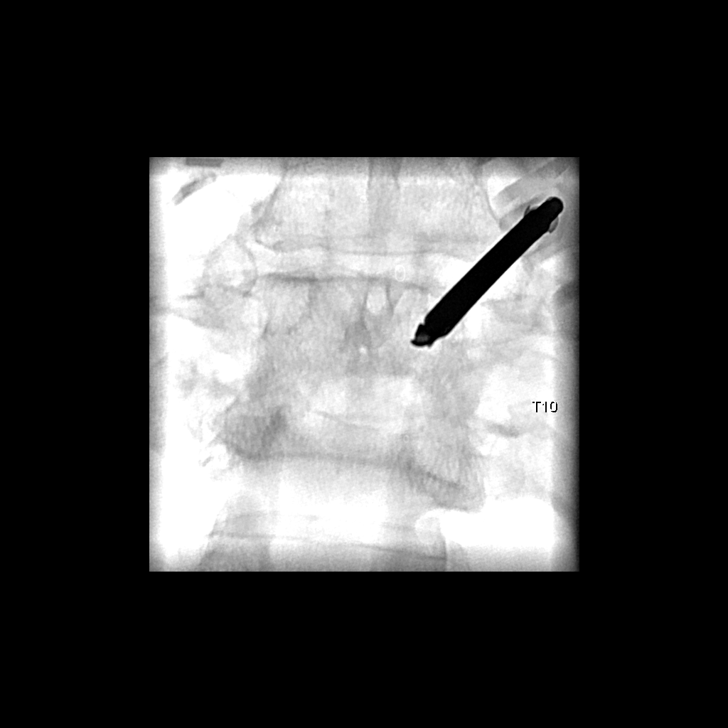
[im 2/2]
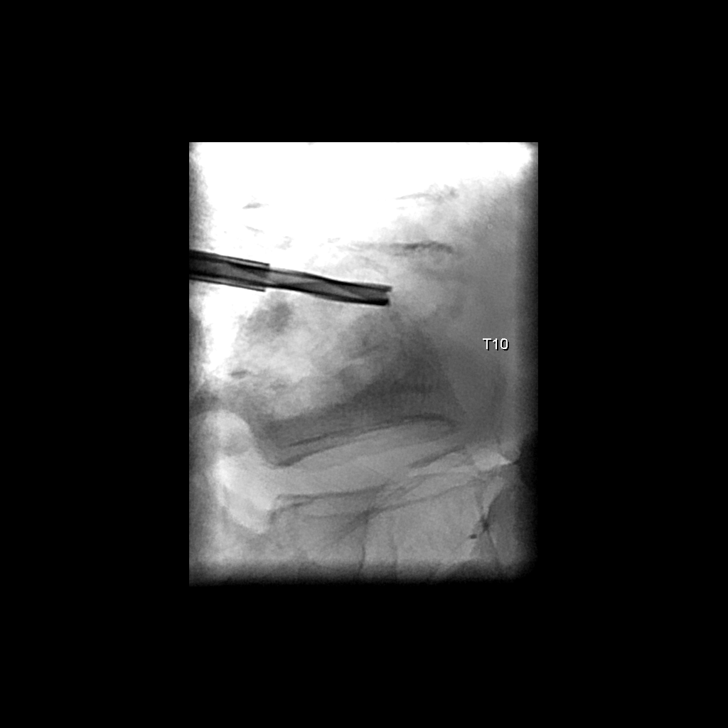

[Series 2: myelo · 2 of 2 slices shown (2 of 4)]
[im 1/2]
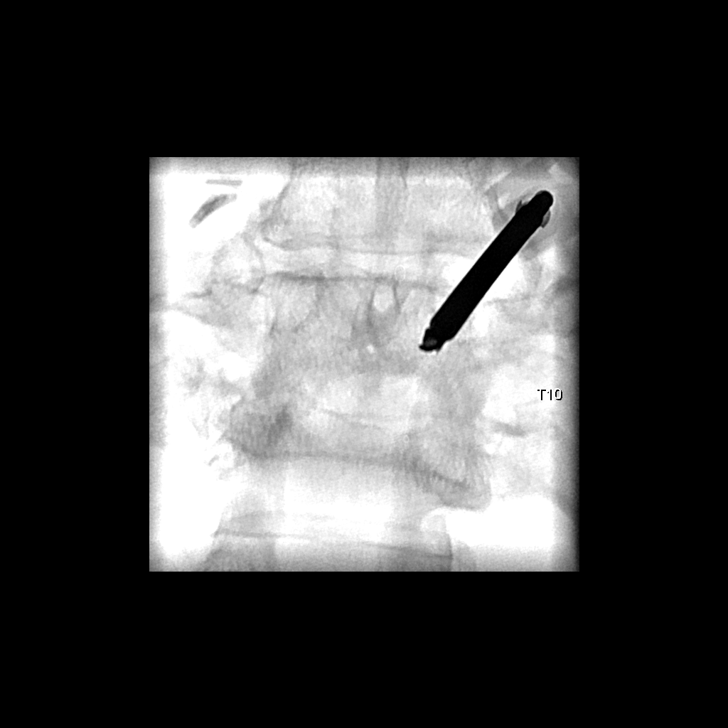
[im 2/2]
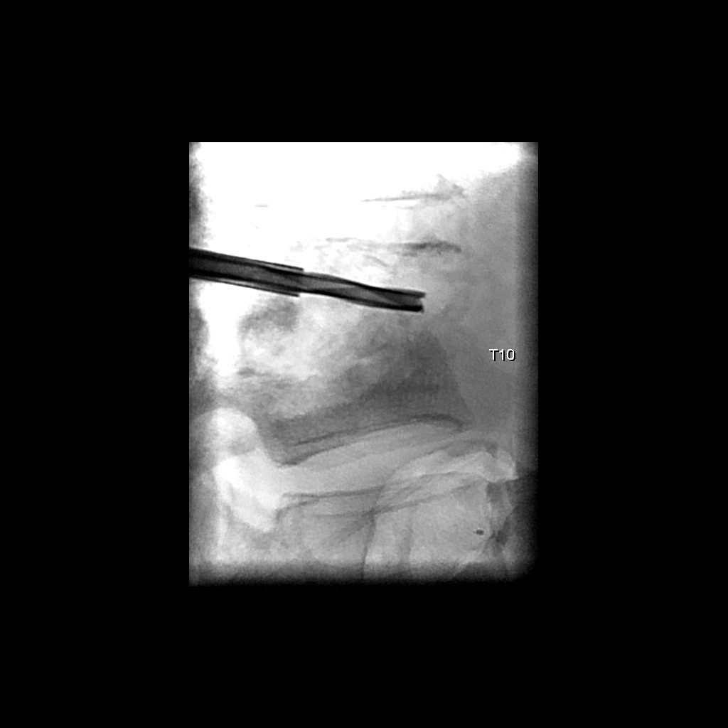

[Series 3: myelo · 2 of 2 slices shown (3 of 4)]
[im 1/2]
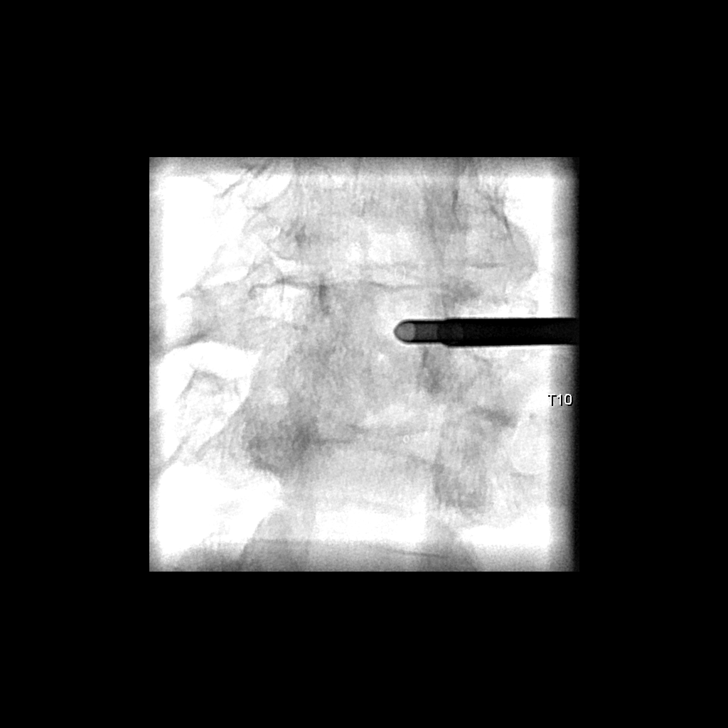
[im 2/2]
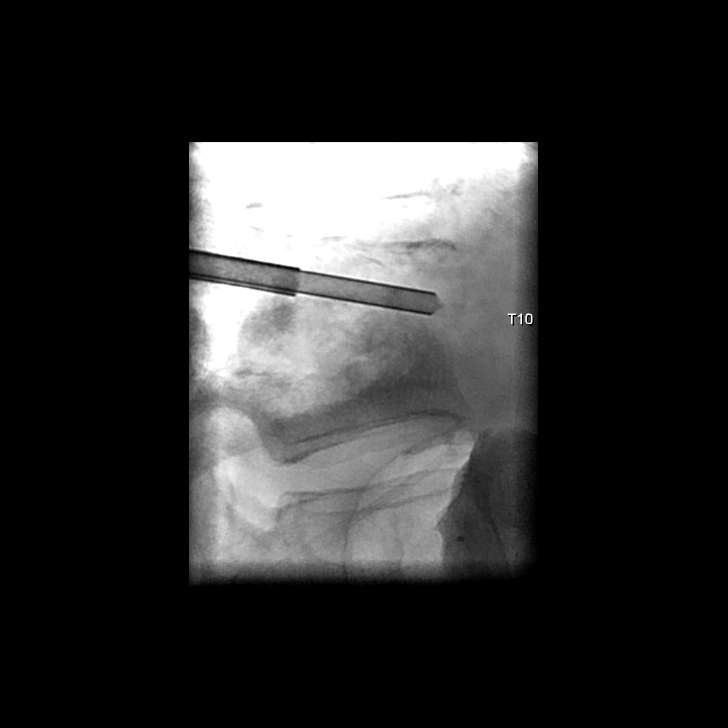

[Series 4: myelo · 2 of 2 slices shown (4 of 4)]
[im 1/2]
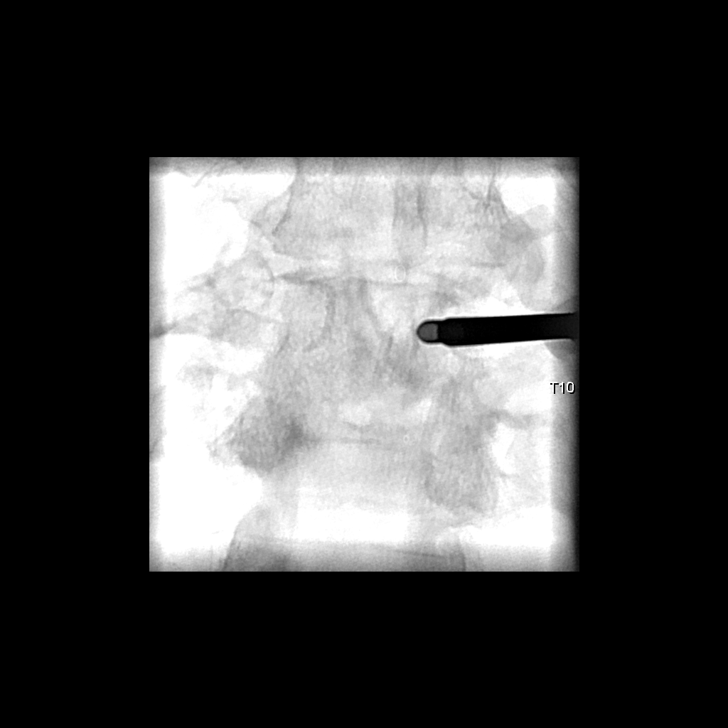
[im 2/2]
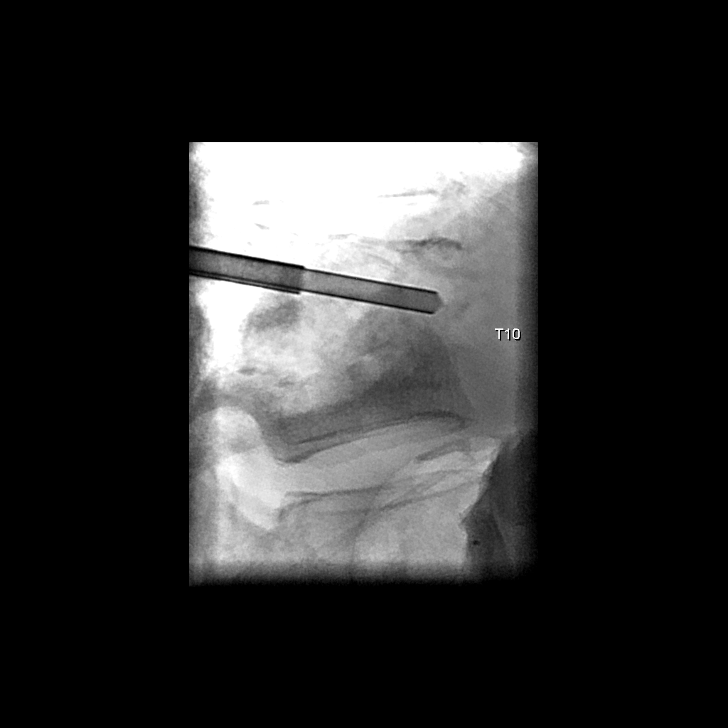

[8 of 8 positions shown; findings below may reference images not displayed]

EXAM:
IR FLUORO GUIDE NEEDLE PLACEMENT /BIOPSY

ANESTHESIA/SEDATION:
Conscious sedation.

MEDICATIONS:
Versed 2 mg IV.  Fentanyl 75 mcg IV.  0.25 mg IV Romazicon.

CONTRAST:  None.

PROCEDURE:
Following a full explanation of the procedure along with the
potential associated complications, an informed witnessed consent
was obtained.

The patient was laid prone on the fluoroscopic table.

The skin overlying the thoracic region was then prepped and draped
in the usual sterile fashion. The skin entry site at T10 was then
infiltrated with 0.25% bupivacaine into the right pedicle.

Using biplane intermittent fluoroscopy, an 11 gauge Jamshidi needle
was then advanced into the posterior one-third at T10. This was then
exchanged out for the Kyphon advanced osteo introducer system
comprising of a working and a Kyphon osteo drill.

The combination was then advanced over a Kyphon osteo bone pin until
the Kyphon osteo drill was in the posterior one-third at T10. At
this time the bone pin was removed. In a medial trajectory, the
combination was then advanced until the Kyphon working cannula was
in the posterior one-third at T10.

At this time the osteo drill was removed and a core sample sent for
microbiologic analysis. Through the working cannula, a Kyphon bone
biopsy device was then advanced to within 5 mm of the anterior
aspect ofEBV. Using a 20 mL syringe, another biopsy sample was then
obtained and sent for microbiology analysis. The working cannula was
then removed. Hemostasis was achieved at the skin entry site.

The patient tolerated the procedure well.

COMPLICATIONS:
None immediate.
FINDINGS: As above.
IMPRESSION: Status post fluoroscopic guided needle placement for deep core bone
biopsy and aspiration at T10 as described without event.
